# Patient Record
Sex: Male | Born: 1965 | Race: Black or African American | Hispanic: No | Marital: Single | Smoking: Never smoker
Health system: Southern US, Community
[De-identification: ages and names within clinical notes are randomized; demographics above are authoritative.]

## PROBLEM LIST (undated history)

## (undated) DIAGNOSIS — E119 Type 2 diabetes mellitus without complications: Secondary | ICD-10-CM

## (undated) DIAGNOSIS — N183 Chronic kidney disease, stage 3 unspecified: Secondary | ICD-10-CM

## (undated) DIAGNOSIS — I10 Essential (primary) hypertension: Secondary | ICD-10-CM

## (undated) DIAGNOSIS — E785 Hyperlipidemia, unspecified: Secondary | ICD-10-CM

## (undated) DIAGNOSIS — R001 Bradycardia, unspecified: Secondary | ICD-10-CM

## (undated) HISTORY — PX: LEG SURGERY: SHX1003

## (undated) HISTORY — PX: HAND SURGERY: SHX662

---

## 1999-11-26 ENCOUNTER — Encounter: Payer: Self-pay | Admitting: Emergency Medicine

## 1999-11-26 ENCOUNTER — Emergency Department (HOSPITAL_COMMUNITY): Admission: EM | Admit: 1999-11-26 | Discharge: 1999-11-26 | Payer: Self-pay | Admitting: Emergency Medicine

## 1999-11-28 ENCOUNTER — Emergency Department (HOSPITAL_COMMUNITY): Admission: EM | Admit: 1999-11-28 | Discharge: 1999-11-28 | Payer: Self-pay | Admitting: Emergency Medicine

## 1999-12-17 ENCOUNTER — Encounter: Admission: RE | Admit: 1999-12-17 | Discharge: 2000-01-02 | Payer: Self-pay | Admitting: Orthopedic Surgery

## 2000-01-23 ENCOUNTER — Encounter: Admission: RE | Admit: 2000-01-23 | Discharge: 2000-03-16 | Payer: Self-pay | Admitting: Orthopedic Surgery

## 2002-12-01 ENCOUNTER — Emergency Department (HOSPITAL_COMMUNITY): Admission: EM | Admit: 2002-12-01 | Discharge: 2002-12-01 | Payer: Self-pay | Admitting: Emergency Medicine

## 2003-09-04 ENCOUNTER — Emergency Department (HOSPITAL_COMMUNITY): Admission: EM | Admit: 2003-09-04 | Discharge: 2003-09-04 | Payer: Self-pay | Admitting: Emergency Medicine

## 2003-11-13 ENCOUNTER — Emergency Department (HOSPITAL_COMMUNITY): Admission: EM | Admit: 2003-11-13 | Discharge: 2003-11-13 | Payer: Self-pay | Admitting: Emergency Medicine

## 2005-02-03 DIAGNOSIS — E119 Type 2 diabetes mellitus without complications: Secondary | ICD-10-CM

## 2006-04-17 ENCOUNTER — Ambulatory Visit: Payer: Self-pay | Admitting: Family Medicine

## 2006-04-21 ENCOUNTER — Ambulatory Visit: Payer: Self-pay | Admitting: *Deleted

## 2006-08-12 ENCOUNTER — Ambulatory Visit: Payer: Self-pay | Admitting: Family Medicine

## 2007-02-16 ENCOUNTER — Ambulatory Visit: Payer: Self-pay | Admitting: Family Medicine

## 2007-02-16 LAB — CONVERTED CEMR LAB: Hgb A1c MFr Bld: 8.1 %

## 2007-06-30 ENCOUNTER — Emergency Department (HOSPITAL_COMMUNITY): Admission: EM | Admit: 2007-06-30 | Discharge: 2007-06-30 | Payer: Self-pay | Admitting: Emergency Medicine

## 2007-07-01 ENCOUNTER — Emergency Department (HOSPITAL_COMMUNITY): Admission: EM | Admit: 2007-07-01 | Discharge: 2007-07-02 | Payer: Self-pay | Admitting: Emergency Medicine

## 2007-12-28 ENCOUNTER — Ambulatory Visit: Payer: Self-pay | Admitting: Family Medicine

## 2008-01-17 ENCOUNTER — Encounter (INDEPENDENT_AMBULATORY_CARE_PROVIDER_SITE_OTHER): Payer: Self-pay | Admitting: *Deleted

## 2008-02-01 ENCOUNTER — Telehealth (INDEPENDENT_AMBULATORY_CARE_PROVIDER_SITE_OTHER): Payer: Self-pay | Admitting: *Deleted

## 2008-02-01 ENCOUNTER — Ambulatory Visit: Payer: Self-pay | Admitting: Internal Medicine

## 2008-02-15 ENCOUNTER — Encounter (INDEPENDENT_AMBULATORY_CARE_PROVIDER_SITE_OTHER): Payer: Self-pay | Admitting: Nurse Practitioner

## 2008-02-16 ENCOUNTER — Ambulatory Visit: Payer: Self-pay | Admitting: Nurse Practitioner

## 2008-02-16 DIAGNOSIS — E1159 Type 2 diabetes mellitus with other circulatory complications: Secondary | ICD-10-CM | POA: Insufficient documentation

## 2008-02-16 DIAGNOSIS — I10 Essential (primary) hypertension: Secondary | ICD-10-CM

## 2008-02-16 LAB — CONVERTED CEMR LAB
Bilirubin Urine: NEGATIVE
Blood Glucose, Fingerstick: 214
Blood in Urine, dipstick: NEGATIVE
Glucose, Urine, Semiquant: >=1000
Hgb A1c MFr Bld: 10.5 %
Nitrite: NEGATIVE
Specific Gravity, Urine: 1.025
Urobilinogen, UA: 0.2
WBC Urine, dipstick: NEGATIVE
pH: 5.5

## 2008-02-18 ENCOUNTER — Encounter (INDEPENDENT_AMBULATORY_CARE_PROVIDER_SITE_OTHER): Payer: Self-pay | Admitting: Nurse Practitioner

## 2008-02-18 DIAGNOSIS — H409 Unspecified glaucoma: Secondary | ICD-10-CM | POA: Insufficient documentation

## 2008-02-18 HISTORY — DX: Unspecified glaucoma: H40.9

## 2008-02-18 LAB — CONVERTED CEMR LAB
ALT: 16 units/L (ref 0–53)
AST: 15 units/L (ref 0–37)
Albumin: 5.1 g/dL (ref 3.5–5.2)
Alkaline Phosphatase: 61 units/L (ref 39–117)
BUN: 16 mg/dL (ref 6–23)
Basophils Absolute: 0 10*3/uL (ref 0.0–0.1)
Basophils Relative: 1 % (ref 0–1)
CO2: 19 meq/L (ref 19–32)
Calcium: 9.7 mg/dL (ref 8.4–10.5)
Chloride: 103 meq/L (ref 96–112)
Creatinine, Ser: 1.03 mg/dL (ref 0.40–1.50)
Eosinophils Absolute: 0.1 10*3/uL (ref 0.0–0.7)
Eosinophils Relative: 2 % (ref 0–5)
Glucose, Bld: 267 mg/dL — ABNORMAL HIGH (ref 70–99)
HCT: 41.8 % (ref 39.0–52.0)
Hemoglobin: 14.2 g/dL (ref 13.0–17.0)
Lymphocytes Relative: 40 % (ref 12–46)
Lymphs Abs: 1.3 10*3/uL (ref 0.7–4.0)
MCHC: 34 g/dL (ref 30.0–36.0)
MCV: 91.5 fL (ref 78.0–100.0)
Microalb, Ur: 2.12 mg/dL — ABNORMAL HIGH (ref 0.00–1.89)
Monocytes Absolute: 0.2 10*3/uL (ref 0.1–1.0)
Monocytes Relative: 6 % (ref 3–12)
Neutro Abs: 1.7 10*3/uL (ref 1.7–7.7)
Neutrophils Relative %: 52 % (ref 43–77)
Platelets: 266 10*3/uL (ref 150–400)
Potassium: 4.3 meq/L (ref 3.5–5.3)
RBC: 4.57 M/uL (ref 4.22–5.81)
RDW: 12.9 % (ref 11.5–15.5)
Sodium: 135 meq/L (ref 135–145)
TSH: 1.161 microintl units/mL (ref 0.350–4.50)
Total Bilirubin: 0.6 mg/dL (ref 0.3–1.2)
Total Protein: 8 g/dL (ref 6.0–8.3)
WBC: 3.3 10*3/uL — ABNORMAL LOW (ref 4.0–10.5)

## 2008-04-26 ENCOUNTER — Ambulatory Visit: Payer: Self-pay | Admitting: Nurse Practitioner

## 2008-04-26 LAB — CONVERTED CEMR LAB
Bilirubin Urine: NEGATIVE
Blood Glucose, Fingerstick: 370
Blood in Urine, dipstick: NEGATIVE
Glucose, Urine, Semiquant: >=1000
Hgb A1c MFr Bld: 13.9 %
Ketones, urine, test strip: NEGATIVE
Nitrite: NEGATIVE
Protein, U semiquant: NEGATIVE
Specific Gravity, Urine: 1.01
Urobilinogen, UA: 0.2
WBC Urine, dipstick: NEGATIVE
pH: 5.5

## 2008-04-27 ENCOUNTER — Telehealth (INDEPENDENT_AMBULATORY_CARE_PROVIDER_SITE_OTHER): Payer: Self-pay | Admitting: Nurse Practitioner

## 2008-04-27 DIAGNOSIS — F528 Other sexual dysfunction not due to a substance or known physiological condition: Secondary | ICD-10-CM

## 2008-05-02 ENCOUNTER — Encounter (INDEPENDENT_AMBULATORY_CARE_PROVIDER_SITE_OTHER): Payer: Self-pay | Admitting: *Deleted

## 2008-05-15 ENCOUNTER — Telehealth (INDEPENDENT_AMBULATORY_CARE_PROVIDER_SITE_OTHER): Payer: Self-pay | Admitting: Nurse Practitioner

## 2008-06-01 ENCOUNTER — Encounter (INDEPENDENT_AMBULATORY_CARE_PROVIDER_SITE_OTHER): Payer: Self-pay | Admitting: Nurse Practitioner

## 2008-06-06 ENCOUNTER — Encounter (INDEPENDENT_AMBULATORY_CARE_PROVIDER_SITE_OTHER): Payer: Self-pay | Admitting: Nurse Practitioner

## 2008-09-19 ENCOUNTER — Ambulatory Visit: Payer: Self-pay | Admitting: Nurse Practitioner

## 2008-09-19 ENCOUNTER — Encounter (INDEPENDENT_AMBULATORY_CARE_PROVIDER_SITE_OTHER): Payer: Self-pay | Admitting: Nurse Practitioner

## 2008-09-27 ENCOUNTER — Ambulatory Visit: Payer: Self-pay | Admitting: Nurse Practitioner

## 2008-09-27 LAB — CONVERTED CEMR LAB
Bilirubin Urine: NEGATIVE
Blood Glucose, Fingerstick: 130
Blood in Urine, dipstick: NEGATIVE
Glucose, Urine, Semiquant: NEGATIVE
Ketones, urine, test strip: NEGATIVE
Nitrite: NEGATIVE
Protein, U semiquant: NEGATIVE
Specific Gravity, Urine: >=1.03
Urobilinogen, UA: 0.2
WBC Urine, dipstick: NEGATIVE
pH: 5

## 2008-10-03 ENCOUNTER — Telehealth (INDEPENDENT_AMBULATORY_CARE_PROVIDER_SITE_OTHER): Payer: Self-pay | Admitting: *Deleted

## 2008-10-03 LAB — CONVERTED CEMR LAB
Cholesterol: 211 mg/dL — ABNORMAL HIGH (ref 0–200)
HDL: 67 mg/dL (ref >39)
Hgb A1c MFr Bld: 7.8 % — ABNORMAL HIGH (ref 4.6–6.1)
LDL Cholesterol: 133 mg/dL — ABNORMAL HIGH (ref 0–99)
Sex Hormone Binding: 28 nmol/L (ref 13–71)
Testosterone Free: 83.3 pg/mL (ref 47.0–244.0)
Testosterone-% Free: 2.2 % (ref 1.6–2.9)
Testosterone: 376.62 ng/dL (ref 350–890)
Total CHOL/HDL Ratio: 3.1
Triglycerides: 57 mg/dL (ref <150)
VLDL: 11 mg/dL (ref 0–40)

## 2008-10-04 ENCOUNTER — Encounter (INDEPENDENT_AMBULATORY_CARE_PROVIDER_SITE_OTHER): Payer: Self-pay | Admitting: Nurse Practitioner

## 2008-11-29 ENCOUNTER — Encounter (INDEPENDENT_AMBULATORY_CARE_PROVIDER_SITE_OTHER): Payer: Self-pay | Admitting: *Deleted

## 2009-02-26 ENCOUNTER — Ambulatory Visit: Payer: Self-pay | Admitting: Nurse Practitioner

## 2009-02-26 DIAGNOSIS — E7800 Pure hypercholesterolemia, unspecified: Secondary | ICD-10-CM | POA: Insufficient documentation

## 2009-02-26 LAB — CONVERTED CEMR LAB
AST: 33 units/L (ref 0–37)
Alkaline Phosphatase: 46 units/L (ref 39–117)
BUN: 16 mg/dL (ref 6–23)
Blood Glucose, AC Bkfst: 177 mg/dL
Blood in Urine, dipstick: NEGATIVE
Creatinine, Ser: 1.15 mg/dL (ref 0.40–1.50)
Glucose, Urine, Semiquant: >=1000
HDL goal, serum: 40 mg/dL
HDL: 49 mg/dL (ref >39)
LDL Cholesterol: 105 mg/dL — ABNORMAL HIGH (ref 0–99)
LDL Goal: 100 mg/dL
TSH: 1.988 microintl units/mL (ref 0.350–4.500)
Total Bilirubin: 0.3 mg/dL (ref 0.3–1.2)
Total CHOL/HDL Ratio: 3.4
VLDL: 12 mg/dL (ref 0–40)
pH: 5.5

## 2009-02-28 ENCOUNTER — Encounter (INDEPENDENT_AMBULATORY_CARE_PROVIDER_SITE_OTHER): Payer: Self-pay | Admitting: Nurse Practitioner

## 2009-02-28 ENCOUNTER — Telehealth (INDEPENDENT_AMBULATORY_CARE_PROVIDER_SITE_OTHER): Payer: Self-pay | Admitting: *Deleted

## 2009-04-11 ENCOUNTER — Telehealth (INDEPENDENT_AMBULATORY_CARE_PROVIDER_SITE_OTHER): Payer: Self-pay | Admitting: Nurse Practitioner

## 2009-07-24 ENCOUNTER — Encounter (INDEPENDENT_AMBULATORY_CARE_PROVIDER_SITE_OTHER): Payer: Self-pay | Admitting: Nurse Practitioner

## 2009-10-01 ENCOUNTER — Encounter (INDEPENDENT_AMBULATORY_CARE_PROVIDER_SITE_OTHER): Payer: Self-pay | Admitting: Nurse Practitioner

## 2010-02-08 ENCOUNTER — Encounter (INDEPENDENT_AMBULATORY_CARE_PROVIDER_SITE_OTHER): Payer: Self-pay | Admitting: *Deleted

## 2010-03-07 NOTE — Progress Notes (Signed)
Summary: pharmacy concern  Phone Note Call from Patient   Summary of Call: PT CALLED STATING THAT HE HAS NOT BEEN ABLE TO GET HIS VIAGRA 100MG  FILLED SINCE HE IS WAITING ON ICP. PT STATES PHARMACY TOLD HIM THEY HAVE 50 MG ON HAND BUT SINCE HIS  PROVIDER DIDN'T WRITE FOR THAT THEY CAN'T GIVE TO HIM. I MADE A CALL TO PHARMACY AND SPOKE WITH JENNIFER AND THEY DO HAVE 50 BUT OBVIOUSLY THEY ARE FOR OTHER PATIENTS ON THE PATIENT ASSISTANCE PROGRAM. PLEASE CONTACT PT AND LET HIM KNOW THAT EVEN IF RX IS CHANGED THEY WOULD HAVE TO DO A NEW APPLICATION AND THE PROCESS STARTS ALL OVER AGAIN. THE MEDS THAT ARE THERE FOR 50MG  ARE FOR PATIENTS IN THE PROGRAM WHOSE RX SAYS 50MG . Initial call taken by: Mikey College CMA,  April 11, 2009 9:22 AM  Follow-up for Phone Call        spoke with patient regarding the process in the pharmacy and what needs to happen and what happens when a patient uses the patient assistance program. pt vebalized understanding and is aware there is nothing we can do until the medication comes in. Follow-up by: Mikey College CMA,  April 13, 2009 2:11 PM

## 2010-03-07 NOTE — Progress Notes (Signed)
Summary: Form   Phone Note Outgoing Call   Summary of Call: form completed and in office 1. fax copy  2.  make copy for EMR 3. mail original to pt Initial call taken by: Lehman Prom FNP,  February 28, 2009 8:23 AM  Follow-up for Phone Call        FORM FAXED TO [phone] SCANNED INTO EMR MAILED ORIGINAL BACK TO PT @ [address] [id number] [id number] Follow-up by: Arta Bruce,  February 28, 2009 9:41 AM

## 2010-03-07 NOTE — Letter (Signed)
Summary: *HSN Results Follow up  [facility]  [address] [place], [place] [address]   Phone: [phone]  Fax: [phone]      02/08/2010   James Bradford [id number] [place], [place]  [address]   Dear  James Bradford,                            ____S.Drinkard,FNP   ____D. Gore,FNP       ____B. McPherson,MD   ____V. Rankins,MD    ____E. Mulberry,MD    ____N. Daphine Deutscher, FNP  ____D. Reche Dixon, MD    ____K. Philipp Deputy, MD    ____Other     This letter is to inform you that your recent test(s):  _______Pap Smear    _______Lab Test     _______X-ray    _______ is within acceptable limits  _______ requires a medication change  _______ requires a follow-up lab visit  _______ requires a follow-up visit with your provider   Comments:  We have been trying to reach you at [phone].  Please contact the office at your earliest convenience.       _________________________________________________________ If you have any questions, please contact our office                     Sincerely,  James Bradford [facility]

## 2010-03-07 NOTE — Letter (Signed)
Summary: Letter//DISCHARGE LETTER  Letter//DISCHARGE LETTER   Imported By: Arta Bruce 07/24/2009 14:13:00  _____________________________________________________________________  External Attachment:    Type:   Image     Comment:   External Document

## 2010-03-07 NOTE — Letter (Signed)
Summary: [facility]/MAILED ORGINAL TO PT  [facility]/MAILED ORGINAL TO PT   Imported By: Arta Bruce 02/28/2009 09:34:46  _____________________________________________________________________  External Attachment:    Type:   Image     Comment:   External Document

## 2010-03-07 NOTE — Assessment & Plan Note (Signed)
Summary: Diabetes   Vital Signs:  Patient profile:   45 year old male Height:      68 inches Weight:      174.1 pounds BMI:     26.57 Temp:     98.6 degrees F oral Pulse rate:   87 / minute Pulse rhythm:   regular Resp:     18 per minute BP sitting:   119 / 79  (left arm) Cuff size:   regular  Vitals Entered By: Arthor Captain (February 26, 2009 11:08 AM) CC: follow-up visit, DM, Hypertension Management, Lipid Management Is Patient Diabetic? Yes Pain Assessment Patient in pain? no      CBG Device ID a  Does patient need assistance? Functional Status Self care Ambulation Normal   Primary Care Provider:  Lehman Prom FNP  CC:  follow-up visit, DM, Hypertension Management, and Lipid Management.  History of Present Illness:  Pt into the office for follow up on labs.  Diabetes - Pt stopped taking his medications due to GI symptoms.   Seems inconsistent regarding his meds and if he wants to take the meds.    Obesity - Pt has started exercising earlier this month. Changed eating habits and he has lost weight and then questions why he is losing so much wieght.  He is taking lots of supplements and working out 3 hours per day.  ED - Pt reports that he has started taking the cholesterol med at night but has noticed it interfering with his ED.  Not willing to elaborate with a male provider.  He would rather discuss with a male provider  Forms in office today that he needs completed for school. The same forms were completed 1-2 months ago by this provider but no indication given to pt why he needs the again.      Hypertension History:      He denies headache, chest pain, and palpitations.  He notes no problems with any antihypertensive medication side effects.        Positive major cardiovascular risk factors include diabetes, hyperlipidemia, and hypertension.  Negative major cardiovascular risk factors include male age less than 61 years old and non-tobacco-user  status.        Further assessment for target organ damage reveals no history of ASHD, cardiac end-organ damage (CHF/LVH), stroke/TIA, peripheral vascular disease, renal insufficiency, or hypertensive retinopathy.    Lipid Management History:      Positive NCEP/ATP III risk factors include diabetes and hypertension.  Negative NCEP/ATP III risk factors include male age less than 66 years old, HDL cholesterol greater than 60, non-tobacco-user status, no ASHD (atherosclerotic heart disease), no prior stroke/TIA, no peripheral vascular disease, and no history of aortic aneurysm.        The patient states that he does not know about the "Therapeutic Lifestyle Change" diet.  The patient does not know about adjunctive measures for cholesterol lowering.  He expresses no side effects from his lipid-lowering medication.  The patient denies any symptoms to suggest myopathy or liver disease.  Comments: Pt is taking his cholesterol meds as ordered.    Habits & Providers  Alcohol-Tobacco-Diet     Alcohol drinks/day: <1     Alcohol Counseling: to decrease amount and/or frequency of alcohol intake     Alcohol type: beer     Tobacco Status: never     Passive Smoke Exposure: no  Exercise-Depression-Behavior     Does Patient Exercise: yes     Exercise Counseling: not indicated; exercise  is adequate     Type of exercise: gym     Depression Counseling: not indicated; screening negative for depression     Drug Use: no     Seat Belt Use: 100  Allergies (verified): 1)  ! * Amaryl  Review of Systems CV:  Denies chest pain or discomfort, swelling of feet, and swelling of hands. Resp:  Denies cough. GI:  Complains of vomiting; denies diarrhea and nausea.  Physical Exam  General:  alert.   Head:  normocephalic.   Lungs:  normal breath sounds.   Heart:  normal rate and regular rhythm.   Abdomen:  non-tender and normal bowel sounds.   Msk:  up to exam table Neurologic:  alert & oriented X3.      Impression & Recommendations:  Problem # 1:  DIABETES MELLITUS, TYPE II (ICD-250.00) Uncontrolled advised pt that he will need to either take meds as ordered or he will need to take insulin His updated medication list for this problem includes:    Lisinopril-hydrochlorothiazide 10-12.5 Mg Tabs (Lisinopril-hydrochlorothiazide) .Marland Kitchen... 1 tablet by mouth by mouth daily for blood pressure    Glucotrol Xl 10 Mg Xr24h-tab (Glipizide) .Marland Kitchen... 1 tablet by mouth daily for blood sugar    Metformin Hcl 500 Mg Tabs (Metformin hcl) .Marland Kitchen... 2 tablets by mouth two times a day for diabetes    Bayer Low Strength 81 Mg Tbec (Aspirin) ..... One tablet by mouth daily  Orders: Hemoglobin A1C (83036) Capillary Blood Glucose/CBG (96045) UA Dipstick w/o Micro (manual) (40981) T-Urine Microalbumin w/creat. ratio [id number])  Problem # 2:  HYPERTENSION, BENIGN ESSENTIAL (ICD-401.1) DASH diet stable His updated medication list for this problem includes:    Lisinopril-hydrochlorothiazide 10-12.5 Mg Tabs (Lisinopril-hydrochlorothiazide) .Marland Kitchen... 1 tablet by mouth by mouth daily for blood pressure  Orders: T-HIV Antibody  (Reflex) ([id number]) T-TSH ([id number])  Problem # 3:  HYPERCHOLESTEROLEMIA (ICD-272.0) will check lipids today His updated medication list for this problem includes:    Pravastatin Sodium 10 Mg Tabs (Pravastatin sodium) ..... One tablet by mouth nightly for cholesterol  Orders: T-Lipid Profile ([id number]) T-Comprehensive Metabolic Panel ([id number])  Complete Medication List: 1)  Lisinopril-hydrochlorothiazide 10-12.5 Mg Tabs (Lisinopril-hydrochlorothiazide) .Marland Kitchen.. 1 tablet by mouth by mouth daily for blood pressure 2)  Glucotrol Xl 10 Mg Xr24h-tab (Glipizide) .Marland Kitchen.. 1 tablet by mouth daily for blood sugar 3)  Metformin Hcl 500 Mg Tabs (Metformin hcl) .... 2 tablets by mouth two times a day for diabetes 4)  Glucometer Elite Classic Kit (Blood glucose monitoring suppl) .... Dispense  glucometer dx 250.02 5)  Lancets Misc (Lancets) .... To check blood sugar twice daily 6)  Sidekick Blood Glucose System Devi (Blood gluc meter disp-strips) .... To use with glucometer to check blood sugar two times a day  dx 250.02 7)  Bayer Low Strength 81 Mg Tbec (Aspirin) .... One tablet by mouth daily 8)  Pravastatin Sodium 10 Mg Tabs (Pravastatin sodium) .... One tablet by mouth nightly for cholesterol 9)  Viagra 100 Mg Tabs (Sildenafil citrate) .Marland Kitchen.. 1 tablet by mouth as needed 30 minutes before sexual activity  Other Orders: T-PSA ([id number])  Hypertension Assessment/Plan:      The patient's hypertensive risk group is category C: Target organ damage and/or diabetes.  His calculated 10 year risk of coronary heart disease is 6 %.  Today's blood pressure is 119/79.  His blood pressure goal is < 130/80.  Lipid Assessment/Plan:      Based on NCEP/ATP III, the patient's risk factor category is "history of  diabetes".  The patient's lipid goals are as follows: Total cholesterol goal is 200; LDL cholesterol goal is 100; HDL cholesterol goal is 40; Triglyceride goal is 150.    Patient Instructions: 1)  Follow up in this office in 3 months for diabetes. 2)  Your form will be completed and faxed.  The hardcopy will be mailed.  Laboratory Results   Urine Tests  Date/Time Received: February 26, 2009 11:24 AM  Date/Time Reported: February 26, 2009 11:27 AM   Routine Urinalysis   Color: lt. yellow Appearance: Clear Glucose: >=1000   (Normal Range: Negative) Bilirubin: negative   (Normal Range: Negative) Ketone: trace (5)   (Normal Range: Negative) Spec. Gravity: 1.015   (Normal Range: 1.003-1.035) Blood: negative   (Normal Range: Negative) pH: 5.5   (Normal Range: 5.0-8.0) Protein: trace   (Normal Range: Negative) Urobilinogen: 0.2   (Normal Range: 0-1) Nitrite: negative   (Normal Range: Negative) Leukocyte Esterace: negative   (Normal Range: Negative)     Blood Tests      HGBA1C: 8.8%   (Normal Range: Non-Diabetic - 3-6%   Control Diabetic - 6-8%) CBG Fasting:: 177      Laboratory Results   Urine Tests    Routine Urinalysis   Color: lt. yellow Appearance: Clear Glucose: >=1000   (Normal Range: Negative) Bilirubin: negative   (Normal Range: Negative) Ketone: trace (5)   (Normal Range: Negative) Spec. Gravity: 1.015   (Normal Range: 1.003-1.035) Blood: negative   (Normal Range: Negative) pH: 5.5   (Normal Range: 5.0-8.0) Protein: trace   (Normal Range: Negative) Urobilinogen: 0.2   (Normal Range: 0-1) Nitrite: negative   (Normal Range: Negative) Leukocyte Esterace: negative   (Normal Range: Negative)     Blood Tests     HGBA1C: 8.8%   (Normal Range: Non-Diabetic - 3-6%   Control Diabetic - 6-8%) CBG Fasting:: 177mg /dL

## 2010-03-07 NOTE — Letter (Signed)
Summary: FAXED REQUESTED RECORDS TO [facility]  FAXED REQUESTED RECORDS TO [facility]   Imported By: Arta Bruce 10/01/2009 11:12:33  _____________________________________________________________________  External Attachment:    Type:   Image     Comment:   External Document

## 2010-03-07 NOTE — Letter (Signed)
Summary: Lipid Letter  [facility]  [address] [place], [place] [address]   Phone: [phone]  Fax: [phone]    02/28/2009  Dorinda Hill [address] [place], [place]  [address]  Dear Gerda Diss:  We have carefully reviewed your last lipid profile from 02/26/2009 and the results are noted below with a summary of recommendations for lipid management.    Cholesterol:       166     Goal: less than 200   HDL "good" Cholesterol:   49     Goal: greater than 40   LDL "bad" Cholesterol:   105     Goal: less than 70   Triglycerides:       59     Goal: less than 150    Recent labs shows that your blood sugar is still high.  As discussed during the office visit you will need to take diabetes medications as ordered or the next alternative will be insulin.  Good control now is beneficial to your kidneys, eyes, circulation, etc.    Your cholesterol is much improved.  Continue current medications.     Current Medications: 1)    Lisinopril-hydrochlorothiazide 10-12.5 Mg Tabs (Lisinopril-hydrochlorothiazide) .Marland Kitchen.. 1 tablet by mouth by mouth daily for blood pressure 2)    Glucotrol Xl 10 Mg Xr24h-tab (Glipizide) .Marland Kitchen.. 1 tablet by mouth daily for blood sugar 3)    Metformin Hcl 500 Mg Tabs (Metformin hcl) .... 2 tablets by mouth two times a day for diabetes 4)    Glucometer Elite Classic  Kit (Blood glucose monitoring suppl) .... Dispense glucometer dx 250.02 5)    Lancets  Misc (Lancets) .... To check blood sugar twice daily 6)    Sidekick Blood Glucose System  Devi (Blood gluc meter disp-strips) .... To use with glucometer to check blood sugar two times a day  dx 250.02 7)    Bayer Low Strength 81 Mg Tbec (Aspirin) .... One tablet by mouth daily 8)    Pravastatin Sodium 10 Mg Tabs (Pravastatin sodium) .... One tablet by mouth nightly for cholesterol 9)    Viagra 100 Mg Tabs (Sildenafil citrate) .Marland Kitchen.. 1 tablet by mouth as needed 30 minutes before sexual activity  If you have  any questions, please call. We appreciate being able to work with you.   Sincerely,    [facility] Lehman Prom FNP

## 2010-03-07 NOTE — Letter (Signed)
Summary: *Referral Letter  [facility]  [address] [place], [place] [address]   Phone: [phone]  Fax: [phone]    02/26/2009   James Bradford [address] [place], [place]  [address]  Phone: [phone]  To whom it may concern: [name] is being seen in this office for uncontrolled diabetes.  His medications is being tritrated and regulated for better control.  He will maintain follow up in this office at regular intervals for diabetes and high blood pressure.  Current Medical Problems: 1)  HYPERCHOLESTEROLEMIA (ICD-272.0) 2)  ERECTILE DYSFUNCTION (ICD-302.72) 3)  GLAUCOMA (ICD-365.9) 4)  HYPERTENSION, BENIGN ESSENTIAL (ICD-401.1) 5)  DIABETES MELLITUS, TYPE II (ICD-250.00)   Current Medications: 1)  LISINOPRIL-HYDROCHLOROTHIAZIDE 10-12.5 MG TABS (LISINOPRIL-HYDROCHLOROTHIAZIDE) 1 tablet by mouth by mouth daily for blood pressure 2)  GLUCOTROL XL 10 MG XR24H-TAB (GLIPIZIDE) 1 tablet by mouth daily for blood sugar 3)  METFORMIN HCL 500 MG TABS (METFORMIN HCL) 2 tablets by mouth two times a day for diabetes 4)  GLUCOMETER ELITE CLASSIC  KIT (BLOOD GLUCOSE MONITORING SUPPL) dispense glucometer Dx 250.02 5)  LANCETS  MISC (LANCETS) To check blood sugar twice daily 6)  SIDEKICK BLOOD GLUCOSE SYSTEM  DEVI (BLOOD GLUC METER DISP-STRIPS) To use with glucometer to check blood sugar two times a day  Dx 250.02 7)  BAYER LOW STRENGTH 81 MG TBEC (ASPIRIN) One tablet by mouth daily 8)  PRAVASTATIN SODIUM 10 MG TABS (PRAVASTATIN SODIUM) One tablet by mouth nightly for cholesterol 9)  VIAGRA 100 MG TABS (SILDENAFIL CITRATE) 1 tablet by mouth as needed 30 minutes before sexual activity   Please contact us if you have any further questions or need additional information.  Sincerely,   Lehman Prom FNP [facility]

## 2010-07-10 ENCOUNTER — Inpatient Hospital Stay (INDEPENDENT_AMBULATORY_CARE_PROVIDER_SITE_OTHER)
Admission: RE | Admit: 2010-07-10 | Discharge: 2010-07-10 | Disposition: A | Discharge status: Home / Self Care | Payer: Self-pay | Source: Ambulatory Visit | Attending: Family Medicine | Admitting: Family Medicine

## 2010-07-10 DIAGNOSIS — Z760 Encounter for issue of repeat prescription: Secondary | ICD-10-CM

## 2010-07-10 LAB — HEMOGLOBIN A1C: Mean Plasma Glucose: 249 mg/dL — ABNORMAL HIGH (ref <117)

## 2010-10-30 LAB — POCT I-STAT 3, VENOUS BLOOD GAS (G3P V)
O2 Saturation: 93
pCO2, Ven: 40.3 — ABNORMAL LOW
pO2, Ven: 67 — ABNORMAL HIGH

## 2010-10-30 LAB — POCT I-STAT, CHEM 8
Chloride: 97
Glucose, Bld: 274 — ABNORMAL HIGH
HCT: 42
Potassium: 3.5

## 2012-08-22 ENCOUNTER — Ambulatory Visit: Payer: Self-pay | Admitting: Physician Assistant

## 2012-08-22 ENCOUNTER — Encounter: Payer: Self-pay | Admitting: Physician Assistant

## 2012-08-22 VITALS — BP 114/74 | HR 83 | Temp 98.0°F | Resp 17 | Ht 67.5 in | Wt 186.0 lb

## 2012-08-22 DIAGNOSIS — Z0289 Encounter for other administrative examinations: Secondary | ICD-10-CM

## 2012-08-22 NOTE — Progress Notes (Signed)
   [address] [place] [address]   Phone [phone]  Subjective:    Patient ID: James Bradford, male    DOB: 1965-11-23, 47 y.o.   MRN: [id number]  HPI Pt presents to clinic for DOT exam.  He has DM, HTN and hypercholesterolemia but he is on meds.  He has tried to change his diet to healthy meats and veggies and fruits and no simple sugars.  He has never had problems with hypoglycemia.  He sleeps well and does not snore.  He does not check his sugar.  A1C - 8 in November 2013  Review of Systems  Constitutional: Negative.   HENT: Negative.   Eyes: Negative.   Respiratory: Negative.   Gastrointestinal: Negative.   Endocrine: Negative.   Genitourinary: Negative.   Musculoskeletal: Negative.   Skin: Negative.   Allergic/Immunologic: Negative.   Neurological: Negative.   Hematological: Negative.        Objective:   Physical Exam  Vitals reviewed. Constitutional: He is oriented to person, place, and time. He appears well-developed and well-nourished.  HENT:  Head: Normocephalic and atraumatic.  Right Ear: External ear normal.  Left Ear: External ear normal.  Eyes: Conjunctivae and EOM are normal. Pupils are equal, round, and reactive to light.  Neck: Normal range of motion. Neck supple.  Cardiovascular: Normal rate, regular rhythm and normal heart sounds.   No murmur heard. Pulmonary/Chest: Effort normal and breath sounds normal.  Abdominal: Soft. Bowel sounds are normal.  Musculoskeletal: Normal range of motion.       Legs: Neurological: He is alert and oriented to person, place, and time. He has normal reflexes.  Skin: Skin is warm and dry.  Psychiatric: He has a normal mood and affect. His behavior is normal. Judgment and thought content normal.   Pt's urine has 2000+ glucose      Assessment & Plan:  Other general medical examination for administrative purposes  Pt has well controlled HTN but his diabetes is not under great control with the glucose in his  urine.  He needs f/u up with his PCP and I need record of his A1C.  I gave him a month card because of his glucosuria and lack of f/u since 11/13.  He will bring in paperwork for my review and if A1C is > 10 I will give him a year card.  Benny Lennert PA-C 08/22/2012 8:57 AM

## 2012-08-23 ENCOUNTER — Emergency Department (INDEPENDENT_AMBULATORY_CARE_PROVIDER_SITE_OTHER)
Admission: EM | Admit: 2012-08-23 | Discharge: 2012-08-23 | Disposition: A | Discharge status: Home / Self Care | Payer: Self-pay | Source: Home / Self Care

## 2012-08-23 ENCOUNTER — Encounter (HOSPITAL_COMMUNITY): Payer: Self-pay | Admitting: *Deleted

## 2012-08-23 DIAGNOSIS — E119 Type 2 diabetes mellitus without complications: Secondary | ICD-10-CM

## 2012-08-23 DIAGNOSIS — K044 Acute apical periodontitis of pulpal origin: Secondary | ICD-10-CM

## 2012-08-23 DIAGNOSIS — E7800 Pure hypercholesterolemia, unspecified: Secondary | ICD-10-CM

## 2012-08-23 DIAGNOSIS — K047 Periapical abscess without sinus: Secondary | ICD-10-CM

## 2012-08-23 HISTORY — DX: Essential (primary) hypertension: I10

## 2012-08-23 HISTORY — DX: Type 2 diabetes mellitus without complications: E11.9

## 2012-08-23 LAB — POCT I-STAT, CHEM 8
Chloride: 101 mEq/L (ref 96–112)
Glucose, Bld: 209 mg/dL — ABNORMAL HIGH (ref 70–99)
HCT: 42 % (ref 39.0–52.0)
Hemoglobin: 14.3 g/dL (ref 13.0–17.0)
Potassium: 3.9 mEq/L (ref 3.5–5.1)

## 2012-08-23 MED ORDER — GLIPIZIDE 5 MG PO TABS: 5.0000 mg | ORAL_TABLET | Freq: Two times a day (BID) | ORAL | Status: DC

## 2012-08-23 MED ORDER — PENICILLIN V POTASSIUM 500 MG PO TABS: 500.0000 mg | ORAL_TABLET | Freq: Four times a day (QID) | ORAL | Status: DC

## 2012-08-23 MED ORDER — METFORMIN HCL 500 MG PO TABS: 1000.0000 mg | ORAL_TABLET | Freq: Two times a day (BID) | ORAL | Status: DC

## 2012-08-23 MED ORDER — PRAVASTATIN SODIUM 10 MG PO TABS: 10.0000 mg | ORAL_TABLET | Freq: Every day | ORAL | Status: DC

## 2012-08-23 MED ORDER — LISINOPRIL 10 MG PO TABS: 10.0000 mg | ORAL_TABLET | Freq: Every day | ORAL | Status: DC

## 2012-08-23 NOTE — ED Provider Notes (Signed)
Chief Complaint:   Chief Complaint  Patient presents with  . Diabetes    History of Present Illness:   James Bradford is a 47 year old male who has had diabetes since 2007. He has been on metformin 1000 mg twice a day, glipizide 5 mg daily, and takes lisinopril 10 mg a day for kidney protection. He has been off all his medications for about 4 days. He describes polyuria, polydipsia, blurry vision, and occasional swelling of his legs. He denies any changes in his weight, chest pain, shortness of breath, abdominal pain, nausea, vomiting, diarrhea, extremity pain, paresthesias, or ulcerations. 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 He comes in today to get this drawn here. He was being seen at [facility], but since they shut down he has not been able to get in for care. He also was told that he had swollen lymph nodes. No cause for this was found. He states his throat is sometimes sore. He also has a loose left upper first incisor. He takes pravastatin 10 mg a day for hypercholesterolemia. He is applying for a job in [place] and will be relocating there, although he plans to maintain a household in [place] and return here for his medical care.  Review of Systems:  Other than noted above, the patient denies any of the following symptoms. Systemic:  No fever, chills, fatigue, weight loss or gain. Eye:  No blurred vision or diplopia. Lungs:  No cough, wheezing, or shortness of breath. Heart:  No chest pain, tightness, pressure, palpitation, dizziness, syncope, or edema. Abdomen:  No abdominal pain, nausea, vomiting or diarrrhea. GU:  No dysuria, frequency, urgency, hematuria. Ext:  No pain, paresthesias, swelling, or ulcerations. Endocrine:  No polyuria, polydipsia, heat or cold intolerance. Skin:  No rash or itching. Neuro:  No focal weakness or numbness.   PMFSH:   Past medical history, family history, social history, meds, and allergies were reviewed.   Physical Exam:   Vital signs:  BP 142/92  Pulse 90  Temp(Src) 98.8 F (37.1 C)  Resp 20  SpO2 100% Gen:  Alert, oriented, in no distress. Eye:  PERRL, full EOM, lids conjunctivas, and sclera unremarkable. ENT:  TMs and canals normal.  Mucous membranes moist.  No acetone odor.  Pharynx clear.  His left, upper, first incisor is somewhat loose and tender to palpation. Neck:  Supple, full ROM, no adenopathy or tenderness.  No JVD. He does not have any swollen or enlarged lymph nodes. Lungs:  Clear to auscultation.  No wheezes, rales or rhonchi. Heart:  Regular rhythm.  No gallops or murmers. Abdomen:  Soft, flat, non-distended, nontener.  No hepato-splenomegaly or mass.  Bowel sounds normal.  No pulsatile midline mass or bruit. Ext:  No edema, pulses full.  No ulceration or skin lesions. Skin:  Clear, warm and dry.  No rash or lesions. Neuro:  Alert and oriented times 3.  No focal weakness.  Speech normal.  CNs intact.  Labs:   Results for orders placed during the hospital encounter of 08/23/12  POCT I-STAT, CHEM 8      Result Value Range   Sodium 139  135 - 145 mEq/L   Potassium 3.9  3.5 - 5.1 mEq/L   Chloride 101  96 - 112 mEq/L   BUN 19  6 - 23 mg/dL   Creatinine, Ser  1.00  0.50 - 1.35 mg/dL   Glucose, Bld 161 (*) 70 - 99 mg/dL   Calcium, Ion 0.96 (*) 1.12 - 1.23 mmol/L   TCO2 24  0 - 100 mmol/L   Hemoglobin 14.3  13.0 - 17.0 g/dL   HCT 04.5  40.9 - 81.1 %    A hemoglobin A1c was also drawn.  Assessment:  The primary encounter diagnosis was DIABETES MELLITUS, TYPE II. Diagnoses of HYPERCHOLESTEROLEMIA and Dental infection were also pertinent to this visit.  Needs followup and was given the name of Dr. Standley Dakins at the [facility].  Plan:   1.  The following meds were prescribed:   Discharge Medication List as of 08/23/2012  9:35 AM    START taking these  medications   Details  !! glipiZIDE (GLUCOTROL) 5 MG tablet Take 1 tablet (5 mg total) by mouth 2 (two) times daily before a meal., Starting 08/23/2012, Until Discontinued, Normal    !! lisinopril (PRINIVIL,ZESTRIL) 10 MG tablet Take 1 tablet (10 mg total) by mouth daily., Starting 08/23/2012, Until Discontinued, Normal    !! metFORMIN (GLUCOPHAGE) 500 MG tablet Take 2 tablets (1,000 mg total) by mouth 2 (two) times daily with a meal., Starting 08/23/2012, Until Discontinued, Normal    penicillin v potassium (VEETID) 500 MG tablet Take 1 tablet (500 mg total) by mouth 4 (four) times daily., Starting 08/23/2012, Until Discontinued, Normal    !! pravastatin (PRAVACHOL) 10 MG tablet Take 1 tablet (10 mg total) by mouth daily., Starting 08/23/2012, Until Discontinued, Normal     !! - Potential duplicate medications found. Please discuss with provider.     2.  The patient was instructed in symptomatic care and handouts were given. 3.  The patient was told to return if becoming worse in any way, if no better in 3 or 4 days, and given some red flag symptoms such as any symptoms of low blood sugar, chest pain, shortness of breath, or GI problems that would indicate earlier return. 4.  Follow up with Dr. Standley Dakins.     Reuben Likes, MD 08/23/12 1136

## 2012-08-23 NOTE — ED Notes (Signed)
Pt   Here    For  evaul         Of  Her  Diabetes        And  htn  Had  Recent      Elevated  Glucose   During a  Dot  Physical         He  denys  Any pain       He  Is  Alert  And  Oriented

## 2012-08-25 NOTE — ED Notes (Signed)
Hgb A1C 11.4 H, mean glucose 280 H.  7/22 Lab shown to Dr. Lorenz Coaster and he said no further action. He said pt. was going to come pick it up and take it to the clinic to follow-up his diabetes. Vassie Moselle 08/25/2012

## 2012-08-31 ENCOUNTER — Telehealth: Payer: Self-pay | Admitting: Radiology

## 2012-08-31 NOTE — Telephone Encounter (Signed)
Patient called very angry. 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 His A1C is over 11 and he is complaining of blurred vision. I explained to him we have to go by DOT guidelines. He was yelling at me over the phone. While I was explaining to him what he needs to get from his PCP he disconnected the call. He states he is going to stop payment on his credit card for the exam. He may call you back , to you [facility]

## 2012-10-01 NOTE — ED Notes (Signed)
Reviewed record with dr Artis Flock, metformin written for quantity of 60 tablets, taken as instructed -meds will only last 15 days.  Dr Artis Flock corrected count, called to pharmacy as escribed, told to increase quantity to 120, a months supply

## 2013-05-19 ENCOUNTER — Emergency Department (HOSPITAL_COMMUNITY)
Admission: EM | Admit: 2013-05-19 | Discharge: 2013-05-19 | Disposition: A | Discharge status: Home / Self Care | Payer: No Typology Code available for payment source | Attending: Emergency Medicine | Admitting: Emergency Medicine

## 2013-05-19 ENCOUNTER — Encounter (HOSPITAL_COMMUNITY): Payer: Self-pay | Admitting: Emergency Medicine

## 2013-05-19 DIAGNOSIS — R739 Hyperglycemia, unspecified: Secondary | ICD-10-CM

## 2013-05-19 DIAGNOSIS — I10 Essential (primary) hypertension: Secondary | ICD-10-CM | POA: Insufficient documentation

## 2013-05-19 DIAGNOSIS — E119 Type 2 diabetes mellitus without complications: Secondary | ICD-10-CM | POA: Insufficient documentation

## 2013-05-19 LAB — CBC
HCT: 34.5 % — ABNORMAL LOW (ref 39.0–52.0)
Hemoglobin: 11.6 g/dL — ABNORMAL LOW (ref 13.0–17.0)
MCH: 31 pg (ref 26.0–34.0)
MCHC: 33.6 g/dL (ref 30.0–36.0)
MCV: 92.2 fL (ref 78.0–100.0)
Platelets: 269 K/uL (ref 150–400)
RBC: 3.74 MIL/uL — ABNORMAL LOW (ref 4.22–5.81)
RDW: 12.4 % (ref 11.5–15.5)
WBC: 4.2 K/uL (ref 4.0–10.5)

## 2013-05-19 LAB — CBG MONITORING, ED
Glucose-Capillary: 597 mg/dL (ref 70–99)
Glucose-Capillary: 544 mg/dL — ABNORMAL HIGH (ref 70–99)
Glucose-Capillary: 287 mg/dL — ABNORMAL HIGH (ref 70–99)
Glucose-Capillary: 133 mg/dL — ABNORMAL HIGH (ref 70–99)

## 2013-05-19 LAB — I-STAT VENOUS BLOOD GAS, ED
Acid-base deficit: 2 mmol/L (ref 0.0–2.0)
Bicarbonate: 24 mEq/L (ref 20.0–24.0)
O2 Saturation: 27 %
TCO2: 25 mmol/L (ref 0–100)
pCO2, Ven: 45.4 mmHg (ref 45.0–50.0)
pH, Ven: 7.33 — ABNORMAL HIGH (ref 7.250–7.300)
pO2, Ven: 19 mmHg — CL (ref 30.0–45.0)

## 2013-05-19 LAB — COMPREHENSIVE METABOLIC PANEL
ALT: 32 U/L (ref 0–53)
AST: 36 U/L (ref 0–37)
Albumin: 4.1 g/dL (ref 3.5–5.2)
Alkaline Phosphatase: 103 U/L (ref 39–117)
BUN: 21 mg/dL (ref 6–23)
CO2: 20 mEq/L (ref 19–32)
Calcium: 9.2 mg/dL (ref 8.4–10.5)
Chloride: 93 mEq/L — ABNORMAL LOW (ref 96–112)
Creatinine, Ser: 1.11 mg/dL (ref 0.50–1.35)
GFR calc Af Amer: >90 mL/min (ref >90)
GFR calc non Af Amer: 78 mL/min — ABNORMAL LOW (ref >90)
Glucose, Bld: 728 mg/dL (ref 70–99)
Potassium: 4.5 mEq/L (ref 3.7–5.3)
Sodium: 132 mEq/L — ABNORMAL LOW (ref 137–147)
Total Bilirubin: 0.2 mg/dL — ABNORMAL LOW (ref 0.3–1.2)
Total Protein: 7.1 g/dL (ref 6.0–8.3)

## 2013-05-19 MED ORDER — PRAVASTATIN SODIUM 10 MG PO TABS: 10.0000 mg | ORAL_TABLET | Freq: Every day | ORAL | Status: DC

## 2013-05-19 MED ORDER — SODIUM CHLORIDE 0.9 % IV BOLUS (SEPSIS)
2000.0000 mL | Freq: Once | INTRAVENOUS | Status: AC
  Administered 2013-05-19: 2000 mL via INTRAVENOUS

## 2013-05-19 MED ORDER — GLIPIZIDE 5 MG PO TABS: 5.0000 mg | ORAL_TABLET | Freq: Two times a day (BID) | ORAL | Status: DC

## 2013-05-19 MED ORDER — LISINOPRIL 10 MG PO TABS: 10.0000 mg | ORAL_TABLET | Freq: Every day | ORAL | Status: DC

## 2013-05-19 MED ORDER — DEXTROSE-NACL 5-0.45 % IV SOLN
INTRAVENOUS | Status: DC
  Administered 2013-05-19: 22:00:00 via INTRAVENOUS

## 2013-05-19 MED ORDER — METFORMIN HCL 1000 MG PO TABS: 1000.0000 mg | ORAL_TABLET | Freq: Two times a day (BID) | ORAL | Status: DC

## 2013-05-19 MED ORDER — SODIUM CHLORIDE 0.9 % IV SOLN
INTRAVENOUS | Status: DC
  Administered 2013-05-19: 4.8 [IU]/h via INTRAVENOUS
  Filled 2013-05-19: qty 1

## 2013-05-19 NOTE — Discharge Instructions (Signed)
Return here as needed. Follow up with your doctor. INcrease

## 2013-05-19 NOTE — ED Notes (Signed)
Pt reports out of his diabetes medication for "a while" and he went to family practice today and they told him to come immediately to ed for high blood sugar. He reports he has felt very thirsty this week. He denies pain

## 2013-05-19 NOTE — ED Provider Notes (Signed)
CSN: [id number]     Arrival date & time 05/19/13  1526 History   First MD Initiated Contact with Patient 05/19/13 1806     Chief Complaint  Patient presents with  . Hyperglycemia     (Consider location/radiation/quality/duration/timing/severity/associated sxs/prior Treatment) HPI James Bradford is a [age] yom with pmhx of DMII who presents tonight with hyperglycemia.  Pt reports he has been out of his Rx Metformin, Glucotrol, Lisinopril, Pravastatin since November due to not being able to afford a PCP visit.  He reports he went to his PCP clinic today for routine evaluation, and the clinic sent him to the ER due to hyperglycemia.  Pt reports his only complaint is some gradually worsening vision to the point that he is having difficulty reading small print along with increased thirst and polyuria, however denies nausea, vomiting, dizziness, dysuria, change in appetite.  Pt states he has f/u with PCP next Thursday, 05/26/13.    Past Medical History  Diagnosis Date  . Diabetes mellitus without complication   . Hypertension    History reviewed. No pertinent past surgical history. History reviewed. No pertinent family history. History  Substance Use Topics  . Smoking status: Never Smoker   . Smokeless tobacco: Not on file  . Alcohol Use: No    Review of Systems  Constitutional: Negative for chills, activity change, appetite change and fatigue.  Eyes: Negative for photophobia and pain.  Respiratory: Negative for shortness of breath.   Cardiovascular: Negative for chest pain and leg swelling.  Gastrointestinal: Negative for nausea, vomiting, abdominal pain, diarrhea and constipation.  Genitourinary: Negative for dysuria and decreased urine volume.  Skin: Negative for pallor.  Neurological: Negative for dizziness, syncope, light-headedness and headaches.  Psychiatric/Behavioral: Negative.       Allergies  Aspirin  Home Medications   Prior to Admission medications   Not on File   BP  106/79  Pulse 80  Temp(Src) 98.7 F (37.1 C) (Oral)  Resp 16  Ht 5\' 7"  (1.702 m)  Wt 174 lb (78.926 kg)  BMI 27.25 kg/m2  SpO2 100% Physical Exam  Nursing note and vitals reviewed. Constitutional: He is oriented to person, place, and time. He appears well-developed and well-nourished. No distress.  HENT:  Head: Normocephalic and atraumatic.  Mouth/Throat: Oropharynx is clear and moist.  Eyes: Pupils are equal, round, and reactive to light. No scleral icterus.  Neck: Normal range of motion. Neck supple. No JVD present.  Cardiovascular: Normal rate, regular rhythm, S1 normal, S2 normal and normal heart sounds.  Exam reveals no gallop.   No murmur heard. Pulses:      Radial pulses are 2+ on the right side, and 2+ on the left side.       Dorsalis pedis pulses are 2+ on the right side, and 2+ on the left side.  Pulmonary/Chest: Effort normal and breath sounds normal. No accessory muscle usage. Not tachypneic. No respiratory distress.  Abdominal: Soft. Normal appearance and bowel sounds are normal. There is no tenderness.  Neurological: He is alert and oriented to person, place, and time. He exhibits normal muscle tone. Coordination normal.  Skin: Skin is warm and dry. No rash noted. He is not diaphoretic. No erythema.  Psychiatric: He has a normal mood and affect. His behavior is normal. Judgment and thought content normal.    ED Course  Procedures (including critical care time) Labs Review Labs Reviewed  CBC - Abnormal; Notable for the following:    RBC 3.74 (*)    Hemoglobin  11.6 (*)    HCT 34.5 (*)    All other components within normal limits  COMPREHENSIVE METABOLIC PANEL - Abnormal; Notable for the following:    Sodium 132 (*)    Chloride 93 (*)    Glucose, Bld 728 (*)    Total Bilirubin 0.2 (*)    GFR calc non Af Amer 78 (*)    All other components within normal limits  CBG MONITORING, ED - Abnormal; Notable for the following:    Glucose-Capillary 597 (*)    All other  components within normal limits  I-STAT VENOUS BLOOD GAS, ED - Abnormal; Notable for the following:    pH, Ven 7.330 (*)    pO2, Ven 19.0 (*)    All other components within normal limits  CBG MONITORING, ED - Abnormal; Notable for the following:    Glucose-Capillary 544 (*)    All other components within normal limits  CBG MONITORING, ED - Abnormal; Notable for the following:    Glucose-Capillary 287 (*)    All other components within normal limits  CBG MONITORING, ED - Abnormal; Notable for the following:    Glucose-Capillary 133 (*)    All other components within normal limits      Patient's anion gap is 15.  The patient is mentating appropriately without difficulty.  The patient does not appear to be in any significant distress.  The patient's pH is also within normal limits.  Patient's bicarbonate is also normal.  Patient would like to go home and followup with his primary care Dr. patient is given his diabetes medications.  He is told to return here as needed.  The patient was hydrated with 3 L of normal saline, and placed on a glucose stabilizer  Carlyle Dollyhristopher W Arielys Wandersee, PA-C 05/21/13 [phone]

## 2013-05-25 NOTE — ED Provider Notes (Signed)
Medical screening examination/treatment/procedure(s) were performed by non-physician practitioner and as supervising physician I was immediately available for consultation/collaboration.   EKG Interpretation None       Raeford RazorStephen Rontrell Moquin, MD 05/25/13 [phone]

## 2013-08-23 ENCOUNTER — Encounter (HOSPITAL_COMMUNITY): Payer: Self-pay | Admitting: Emergency Medicine

## 2013-08-23 ENCOUNTER — Emergency Department (HOSPITAL_COMMUNITY): Payer: No Typology Code available for payment source

## 2013-08-23 ENCOUNTER — Emergency Department (HOSPITAL_COMMUNITY)
Admission: EM | Admit: 2013-08-23 | Discharge: 2013-08-23 | Disposition: A | Discharge status: Home / Self Care | Payer: No Typology Code available for payment source | Attending: Emergency Medicine | Admitting: Emergency Medicine

## 2013-08-23 DIAGNOSIS — I10 Essential (primary) hypertension: Secondary | ICD-10-CM | POA: Insufficient documentation

## 2013-08-23 DIAGNOSIS — R51 Headache: Secondary | ICD-10-CM

## 2013-08-23 DIAGNOSIS — Z79899 Other long term (current) drug therapy: Secondary | ICD-10-CM | POA: Diagnosis not present

## 2013-08-23 DIAGNOSIS — IMO0002 Reserved for concepts with insufficient information to code with codable children: Secondary | ICD-10-CM | POA: Insufficient documentation

## 2013-08-23 DIAGNOSIS — E119 Type 2 diabetes mellitus without complications: Secondary | ICD-10-CM | POA: Diagnosis not present

## 2013-08-23 DIAGNOSIS — T148XXA Other injury of unspecified body region, initial encounter: Secondary | ICD-10-CM

## 2013-08-23 DIAGNOSIS — S0990XA Unspecified injury of head, initial encounter: Secondary | ICD-10-CM | POA: Insufficient documentation

## 2013-08-23 DIAGNOSIS — R519 Headache, unspecified: Secondary | ICD-10-CM

## 2013-08-23 DIAGNOSIS — M25562 Pain in left knee: Secondary | ICD-10-CM

## 2013-08-23 MED ORDER — TETRACAINE HCL 0.5 % OP SOLN
2.0000 [drp] | Freq: Once | OPHTHALMIC | Status: AC
  Administered 2013-08-23: 2 [drp] via OPHTHALMIC
  Filled 2013-08-23: qty 2

## 2013-08-23 MED ORDER — FLUORESCEIN SODIUM 1 MG OP STRP
1.0000 | ORAL_STRIP | Freq: Once | OPHTHALMIC | Status: AC
  Administered 2013-08-23: 1 via OPHTHALMIC
  Filled 2013-08-23: qty 1

## 2013-08-23 MED ORDER — HYDROCODONE-ACETAMINOPHEN 5-325 MG PO TABS
1.0000 | ORAL_TABLET | Freq: Once | ORAL | Status: AC
  Administered 2013-08-23: 1 via ORAL
  Filled 2013-08-23: qty 1

## 2013-08-23 MED ORDER — HYDROCODONE-ACETAMINOPHEN 5-325 MG PO TABS
1.0000 | ORAL_TABLET | ORAL | Status: DC | PRN
Start: 2013-08-23 — End: 2014-06-24
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
## 2013-08-25 NOTE — ED Provider Notes (Signed)
Medical screening examination/treatment/procedure(s) were performed by non-physician practitioner and as supervising physician I was immediately available for consultation/collaboration.   EKG Interpretation None        Layla MawKristen N Samuella Rasool, DO 08/25/13 1102

## 2014-06-13 ENCOUNTER — Emergency Department (HOSPITAL_COMMUNITY)
Admission: EM | Admit: 2014-06-13 | Discharge: 2014-06-13 | Disposition: A | Discharge status: Home / Self Care | Payer: No Typology Code available for payment source | Attending: Emergency Medicine | Admitting: Emergency Medicine

## 2014-06-13 ENCOUNTER — Encounter (HOSPITAL_COMMUNITY): Payer: Self-pay | Admitting: *Deleted

## 2014-06-13 ENCOUNTER — Emergency Department (HOSPITAL_COMMUNITY): Payer: No Typology Code available for payment source

## 2014-06-13 DIAGNOSIS — Y9289 Other specified places as the place of occurrence of the external cause: Secondary | ICD-10-CM | POA: Insufficient documentation

## 2014-06-13 DIAGNOSIS — E119 Type 2 diabetes mellitus without complications: Secondary | ICD-10-CM | POA: Insufficient documentation

## 2014-06-13 DIAGNOSIS — Z79899 Other long term (current) drug therapy: Secondary | ICD-10-CM | POA: Insufficient documentation

## 2014-06-13 DIAGNOSIS — I10 Essential (primary) hypertension: Secondary | ICD-10-CM | POA: Insufficient documentation

## 2014-06-13 DIAGNOSIS — W1839XA Other fall on same level, initial encounter: Secondary | ICD-10-CM | POA: Insufficient documentation

## 2014-06-13 DIAGNOSIS — S42252A Displaced fracture of greater tuberosity of left humerus, initial encounter for closed fracture: Secondary | ICD-10-CM | POA: Insufficient documentation

## 2014-06-13 DIAGNOSIS — Y998 Other external cause status: Secondary | ICD-10-CM | POA: Insufficient documentation

## 2014-06-13 DIAGNOSIS — S42202A Unspecified fracture of upper end of left humerus, initial encounter for closed fracture: Secondary | ICD-10-CM

## 2014-06-13 DIAGNOSIS — S40012A Contusion of left shoulder, initial encounter: Secondary | ICD-10-CM | POA: Insufficient documentation

## 2014-06-13 DIAGNOSIS — Y9389 Activity, other specified: Secondary | ICD-10-CM | POA: Insufficient documentation

## 2014-06-13 MED ORDER — OXYCODONE-ACETAMINOPHEN 5-325 MG PO TABS: 2.0000 | ORAL_TABLET | ORAL | Status: DC | PRN

## 2014-06-13 MED ORDER — OXYCODONE-ACETAMINOPHEN 5-325 MG PO TABS
2.0000 | ORAL_TABLET | Freq: Once | ORAL | Status: AC
  Administered 2014-06-13: 2 via ORAL
  Filled 2014-06-13: qty 2

## 2014-06-13 NOTE — ED Provider Notes (Signed)
CSN: [id number]     Arrival date & time 06/13/14  1216 History  This chart was scribed for Emilia BeckKaitlyn Jaevon Paras, PA-C working with Bethann BerkshireJoseph Zammit, MD by Evon Slackerrance Branch, ED Scribe. This patient was seen in room TR09C/TR09C and the patient's care was started at 1:49 PM.    Chief Complaint  Patient presents with  . Shoulder Injury   Patient is a 49 y.o. male presenting with shoulder injury. The history is provided by the patient. No language interpreter was used.  Shoulder Injury   HPI Comments: James Bradford is a 49 y.o. male who presents to the Emergency Department complaining of left shoulder injury onset 8 days prior. Pt states that he injured the shoulder during a fall while holding the pallet jack. Pt states that he fell onto his out stretched left hand. Pt states that this injury happened in [place] where he was initially medically evaluated ant told that he had a fracture. Pt states that he was placed in splint but states that he removed because it was uncomfortable and caused bruising to his left arm. Pt states that he was prescribed pain medication that he has recently ran out. Pt denies numbness or tingling. Pt denies head injury or LOC.    Past Medical History  Diagnosis Date  . Diabetes mellitus without complication   . Hypertension    History reviewed. No pertinent past surgical history. History reviewed. No pertinent family history. History  Substance Use Topics  . Smoking status: Never Smoker   . Smokeless tobacco: Not on file  . Alcohol Use: No    Review of Systems  Musculoskeletal: Positive for arthralgias.  All other systems reviewed and are negative.    Allergies  Aspirin  Home Medications   Prior to Admission medications   Medication Sig Start Date End Date Taking? Authorizing Provider  glipiZIDE (GLUCOTROL XL) 5 MG 24 hr tablet Take 5 mg by mouth daily with breakfast.    Historical Provider, MD  HYDROcodone-acetaminophen (NORCO/VICODIN) 5-325 MG per tablet  Take 1-2 tablets by mouth every 4 (four) hours as needed. 08/23/13   Heather Laisure, PA-C  Ibuprofen-Diphenhydramine HCl (ADVIL PM) 200-25 MG CAPS Take 2 tablets by mouth at bedtime as needed (headache).    Historical Provider, MD  lisinopril (PRINIVIL,ZESTRIL) 10 MG tablet Take 1 tablet (10 mg total) by mouth daily. 05/19/13   Charlestine Nighthristopher Lawyer, PA-C  metFORMIN (GLUCOPHAGE) 1000 MG tablet Take 1 tablet (1,000 mg total) by mouth 2 (two) times daily with a meal. 05/19/13   Charlestine Nighthristopher Lawyer, PA-C  Multiple Vitamin (MULTIVITAMIN WITH MINERALS) TABS tablet Take 1 tablet by mouth daily.    Historical Provider, MD  pravastatin (PRAVACHOL) 10 MG tablet Take 1 tablet (10 mg total) by mouth daily. 05/19/13   Christopher Lawyer, PA-C   BP 104/79 mmHg  Pulse 94  Temp(Src) 98.4 F (36.9 C) (Oral)  Resp 18  Ht 5\' 7"  (1.702 m)  Wt 175 lb (79.379 kg)  BMI 27.40 kg/m2  SpO2 100%   Physical Exam  Constitutional: He is oriented to person, place, and time. He appears well-developed and well-nourished. No distress.  HENT:  Head: Normocephalic and atraumatic.  Eyes: Conjunctivae and EOM are normal.  Neck: Neck supple. No tracheal deviation present.  Cardiovascular: Normal rate and intact distal pulses.   Pulmonary/Chest: Effort normal. No respiratory distress.  Abdominal: Soft. He exhibits no distension. There is no tenderness. There is no rebound.  Musculoskeletal: Normal range of motion. He exhibits tenderness.  Anterior left tenderness  to palpation, no swelling or obvious deformity, slightly limited ROM due to pain, large area of bruising over the bicep.   Neurological: He is alert and oriented to person, place, and time. Coordination normal.  Skin: Skin is warm and dry.  Psychiatric: He has a normal mood and affect. His behavior is normal.  Nursing note and vitals reviewed.   ED Course  Procedures (including critical care time) DIAGNOSTIC STUDIES: Oxygen Saturation is 100% on RA, normal by my  interpretation.    COORDINATION OF CARE: 2:05 PM-Discussed treatment plan with pt at bedside and pt agreed to plan.     Labs Review Labs Reviewed - No data to display  Imaging Review Dg Clavicle Left  06/13/2014   CLINICAL DATA:  Injury 1 week ago, continued pain LEFT shoulder and proximal humerus  EXAM: LEFT CLAVICLE - 2+ VIEWS  COMPARISON:  None  FINDINGS: AC joint alignment normal.  Osseous mineralization normal.  Displaced fracture greater tuberosity LEFT humerus, margins slightly indistinct compatible with subacute AH.  No additional fracture, dislocation or bone destruction identified.  IMPRESSION: Displaced subacute greater tuberosity fracture LEFT humerus.   Electronically Signed   By: Ulyses SouthwardMark  Boles M.D.   On: 06/13/2014 13:45   Dg Shoulder Left  06/13/2014   CLINICAL DATA:  Pain following injury 1 week prior  EXAM: LEFT SHOULDER - 2+ VIEW  COMPARISON:  None.  FINDINGS: Frontal and Y scapular images were obtained. There is a fracture of the greater tuberosity on the left with mild lateral displacement of the greater tuberosity with respect to the remainder of the humerus. There are 2 small avulsed fragments along the lateral humeral head just superior to the a avulsed greater tuberosity. No dislocation. Joint spaces appear intact. No erosive change.  IMPRESSION: Comminuted fracture of the lateral aspect of the proximal humerus with avulsion of the greater tuberosity. No dislocation. No appreciable arthropathy.   Electronically Signed   By: Bretta BangWilliam  Woodruff III M.D.   On: 06/13/2014 13:45     EKG Interpretation None      MDM   Final diagnoses:  Proximal humerus fracture, left, closed, initial encounter    2:28 PM Xray shows proximal humerus fracture. Patient will have sling and percocet. Patient will have follow up with Dr. Eulah PontMurphy tomorrow morning. No neurovascular compromise.   I personally performed the services described in this documentation, which was scribed in my  presence. The recorded information has been reviewed and is accurate.      Emilia BeckKaitlyn Jaylea Plourde, PA-C 06/13/14 1429  Bethann BerkshireJoseph Zammit, MD 06/14/14 [phone]
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
## 2014-06-13 NOTE — Discharge Instructions (Signed)
Take Percocet as needed for pain. Wear your sling as directed. Follow up with Dr. Eulah PontMurphy tomorrow as scheduled. Refer to attached documents for more information.

## 2014-06-13 NOTE — ED Notes (Signed)
Pt states he fell while holding an industrial "jack" 8 days ago in [place]. States xray showed "fractures" pointing to his clavicle. States was put in a "straight jacket" but has since removed it because it caused a large bruise on left upper arm. Pt is requesting pain meds.

## 2014-06-14 ENCOUNTER — Other Ambulatory Visit: Payer: Self-pay | Admitting: Orthopedic Surgery

## 2014-06-24 ENCOUNTER — Encounter (HOSPITAL_COMMUNITY): Payer: Self-pay | Admitting: Emergency Medicine

## 2014-06-24 ENCOUNTER — Emergency Department (HOSPITAL_COMMUNITY)
Admission: EM | Admit: 2014-06-24 | Discharge: 2014-06-24 | Disposition: A | Discharge status: Home / Self Care | Payer: No Typology Code available for payment source | Attending: Emergency Medicine | Admitting: Emergency Medicine

## 2014-06-24 DIAGNOSIS — S42202S Unspecified fracture of upper end of left humerus, sequela: Secondary | ICD-10-CM | POA: Insufficient documentation

## 2014-06-24 DIAGNOSIS — W1839XS Other fall on same level, sequela: Secondary | ICD-10-CM | POA: Insufficient documentation

## 2014-06-24 DIAGNOSIS — S42302S Unspecified fracture of shaft of humerus, left arm, sequela: Secondary | ICD-10-CM

## 2014-06-24 DIAGNOSIS — I10 Essential (primary) hypertension: Secondary | ICD-10-CM | POA: Insufficient documentation

## 2014-06-24 DIAGNOSIS — Z79899 Other long term (current) drug therapy: Secondary | ICD-10-CM | POA: Insufficient documentation

## 2014-06-24 DIAGNOSIS — E119 Type 2 diabetes mellitus without complications: Secondary | ICD-10-CM | POA: Insufficient documentation

## 2014-06-24 MED ORDER — HYDROCODONE-ACETAMINOPHEN 5-325 MG PO TABS: 2.0000 | ORAL_TABLET | ORAL | Status: DC | PRN

## 2014-06-24 NOTE — ED Notes (Signed)
Patient was unloading a truck when he slipped and fell injuring his left shoulder.  Patient reports it is still sore to the touch and is aching.  [facility] and was referred to an orthopedic clinic.  Patient has healing bruises and slight swelling to the area.  Patient walked out of orthopedic clinic due to the long wait to get a CT.

## 2014-06-24 NOTE — ED Provider Notes (Signed)
CSN: [id number]     Arrival date & time 06/24/14  1012 History   First MD Initiated Contact with Patient 06/24/14 1029     Chief Complaint  Patient presents with  . Shoulder Injury     (Consider location/radiation/quality/duration/timing/severity/associated sxs/prior Treatment) Patient is a 49 y.o. male presenting with shoulder injury. The history is provided by the patient. No language interpreter was used.  Shoulder Injury This is a new problem. Episode onset: 19 days ago. The problem occurs constantly. The problem has been unchanged. Nothing aggravates the symptoms. He has tried nothing for the symptoms. The treatment provided no relief.  Pt reports he had problems seeing orthopaedist due to insurance. Pt was told he might need surgery.   Pt complains of continued pain.  Pt request referral to a different Orthopaedist  Past Medical History  Diagnosis Date  . Diabetes mellitus without complication   . Hypertension    Past Surgical History  Procedure Laterality Date  . Hand surgery    . Leg surgery     History reviewed. No pertinent family history. History  Substance Use Topics  . Smoking status: Never Smoker   . Smokeless tobacco: Not on file  . Alcohol Use: No    Review of Systems  All other systems reviewed and are negative.     Allergies  Aspirin  Home Medications   Prior to Admission medications   Medication Sig Start Date End Date Taking? Authorizing Provider  glipiZIDE (GLUCOTROL XL) 5 MG 24 hr tablet Take 5 mg by mouth daily with breakfast.    Historical Provider, MD  HYDROcodone-acetaminophen (NORCO/VICODIN) 5-325 MG per tablet Take 2 tablets by mouth every 4 (four) hours as needed. 06/24/14   Elson AreasLeslie K Yasmyn Bellisario, PA-C  Ibuprofen-Diphenhydramine HCl (ADVIL PM) 200-25 MG CAPS Take 2 tablets by mouth at bedtime as needed (headache).    Historical Provider, MD  lisinopril (PRINIVIL,ZESTRIL) 10 MG tablet Take 1 tablet (10 mg total) by mouth daily. 05/19/13    Charlestine Nighthristopher Lawyer, PA-C  metFORMIN (GLUCOPHAGE) 1000 MG tablet Take 1 tablet (1,000 mg total) by mouth 2 (two) times daily with a meal. 05/19/13   Charlestine Nighthristopher Lawyer, PA-C  Multiple Vitamin (MULTIVITAMIN WITH MINERALS) TABS tablet Take 1 tablet by mouth daily.    Historical Provider, MD  pravastatin (PRAVACHOL) 10 MG tablet Take 1 tablet (10 mg total) by mouth daily. 05/19/13   Christopher Lawyer, PA-C   BP 110/64 mmHg  Pulse 70  Temp(Src) 98 F (36.7 C) (Oral)  SpO2 100% Physical Exam  Constitutional: He is oriented to person, place, and time. He appears well-developed and well-nourished.  HENT:  Head: Normocephalic and atraumatic.  Eyes: EOM are normal. Pupils are equal, round, and reactive to light.  Neck: Normal range of motion.  Cardiovascular: Normal rate.   Pulmonary/Chest: Effort normal.  Abdominal: He exhibits no distension.  Musculoskeletal: He exhibits tenderness.  Pain with movement right shoulder.  nv and ns intact  Neurological: He is alert and oriented to person, place, and time.  Psychiatric: He has a normal mood and affect.  Nursing note and vitals reviewed.   ED Course  Procedures (including critical care time) Labs Review Labs Reviewed - No data to display  Imaging Review No results found.   EKG Interpretation None      MDM   Final diagnoses:  Humerus fracture, left, sequela    Hydrocodone Schedule to see Dr. Lajoyce Cornersuda for evaluation     Elson AreasLeslie K Karna Abed, PA-C 06/24/14 1051  Ivin BootyJoshua  Jodi Mourning, MD 06/24/14 [phone]

## 2014-06-24 NOTE — Discharge Instructions (Signed)
Shoulder Fracture °You have a fractured humerus (bone in the upper arm) at the shoulder just below the ball of the shoulder joint. Most of the time the bones of a broken shoulder are in an acceptable position. Usually the injury can be treated with a shoulder immobilizer or sling and swath bandage. These devices support the arm and prevent any shoulder movement. If the bones are not in a good position, then surgery is sometimes needed. Shoulder fractures usually cause swelling, pain, and discoloration around the upper arm initially. They heal in 8-12 weeks with proper treatment. °Rest in bed or a reclining chair as long as your shoulder is very painful. Sitting up generally results in less pain at the fracture site. Do not remove your shoulder bandage until your caregiver approves. You may apply ice packs over the shoulder for 20-30 minutes every 2 hours for the next 2-3 days to reduce the pain and swelling. Use your pain medicine as prescribed.  °SEEK IMMEDIATE MEDICAL CARE IF: °· You develop severe shoulder pain unrelieved by rest and taking pain medicine. °· You have pain, numbness, tingling, or weakness in the hand or wrist. °· You develop shortness of breath, chest pain, severe weakness, or fainting. °· You have severe pain with motion of the fingers or wrist. °MAKE SURE YOU:  °· Understand these instructions. °· Will watch your condition. °· Will get help right away if you are not doing well or get worse. °Document Released: 02/28/2004 Document Revised: 04/14/2011 Document Reviewed: 05/10/2008 °ExitCare® Patient Information ©2015 [employer]. This information is not intended to replace advice given to you by your health care provider. Make sure you discuss any questions you have with your health care provider. ° °

## 2014-07-11 ENCOUNTER — Encounter (HOSPITAL_COMMUNITY): Payer: Self-pay | Admitting: Physical Medicine and Rehabilitation

## 2014-07-11 ENCOUNTER — Emergency Department (HOSPITAL_COMMUNITY): Payer: Worker's Compensation

## 2014-07-11 ENCOUNTER — Emergency Department (HOSPITAL_COMMUNITY)
Admission: EM | Admit: 2014-07-11 | Discharge: 2014-07-11 | Disposition: A | Discharge status: Home / Self Care | Payer: Worker's Compensation | Attending: Emergency Medicine | Admitting: Emergency Medicine

## 2014-07-11 DIAGNOSIS — Z79899 Other long term (current) drug therapy: Secondary | ICD-10-CM | POA: Insufficient documentation

## 2014-07-11 DIAGNOSIS — I10 Essential (primary) hypertension: Secondary | ICD-10-CM | POA: Insufficient documentation

## 2014-07-11 DIAGNOSIS — S4992XS Unspecified injury of left shoulder and upper arm, sequela: Secondary | ICD-10-CM

## 2014-07-11 DIAGNOSIS — E119 Type 2 diabetes mellitus without complications: Secondary | ICD-10-CM | POA: Insufficient documentation

## 2014-07-11 DIAGNOSIS — Y30XXXA Falling, jumping or pushed from a high place, undetermined intent, initial encounter: Secondary | ICD-10-CM | POA: Insufficient documentation

## 2014-07-11 MED ORDER — NAPROXEN 375 MG PO TABS: 375.0000 mg | ORAL_TABLET | Freq: Two times a day (BID) | ORAL | Status: DC

## 2014-07-11 MED ORDER — OXYCODONE-ACETAMINOPHEN 5-325 MG PO TABS
1.0000 | ORAL_TABLET | Freq: Once | ORAL | Status: AC
  Administered 2014-07-11: 1 via ORAL
  Filled 2014-07-11: qty 1

## 2014-07-11 NOTE — ED Notes (Signed)
Pt presents to department for evaluation of L shoulder pain. Reports he fell on 5/2 and injured L shoulder at work. Now reports increased pain, ran out of pain medication at home. Pt is alert and oriented x4.

## 2014-07-11 NOTE — ED Provider Notes (Signed)
CSN: [id number]     Arrival date & time 07/11/14  0716 History   First MD Initiated Contact with Patient 07/11/14 [phone]     Chief Complaint  Patient presents with  . Shoulder Pain     Patient is a 49 y.o. male presenting with shoulder pain. The history is provided by the patient. No language interpreter was used.  Shoulder Pain  Mr. James Bradford presents for evaluation of left shoulder pain. He reports that on May 10 he fell off a truck and landed onto his left shoulder and sustained a fracture. He attempted to follow up with Dr. Eulah PontMurphy that he left the office and frustration. He reports that since that time he's had progressive left shoulder pain that now radiates into the left neck and face. The pain is worse in the morning and is worse with range of motion. He denies any fevers, chest pain, difficulty breathing, abdominal pain. He is a type II diabetic denies any additional medical problems. Symptoms are moderate, constant, worsening. He has follow-up scheduled for June 14.  Past Medical History  Diagnosis Date  . Diabetes mellitus without complication   . Hypertension    Past Surgical History  Procedure Laterality Date  . Hand surgery    . Leg surgery     No family history on file. History  Substance Use Topics  . Smoking status: Never Smoker   . Smokeless tobacco: Not on file  . Alcohol Use: No    Review of Systems  All other systems reviewed and are negative.     Allergies  Aspirin  Home Medications   Prior to Admission medications   Medication Sig Start Date End Date Taking? Authorizing Provider  glipiZIDE (GLUCOTROL XL) 5 MG 24 hr tablet Take 5 mg by mouth daily with breakfast.    Historical Provider, MD  HYDROcodone-acetaminophen (NORCO/VICODIN) 5-325 MG per tablet Take 2 tablets by mouth every 4 (four) hours as needed. 06/24/14   Elson AreasLeslie K Sofia, PA-C  Ibuprofen-Diphenhydramine HCl (ADVIL PM) 200-25 MG CAPS Take 2 tablets by mouth at bedtime as needed (headache).     Historical Provider, MD  lisinopril (PRINIVIL,ZESTRIL) 10 MG tablet Take 1 tablet (10 mg total) by mouth daily. 05/19/13   Charlestine Nighthristopher Lawyer, PA-C  metFORMIN (GLUCOPHAGE) 1000 MG tablet Take 1 tablet (1,000 mg total) by mouth 2 (two) times daily with a meal. 05/19/13   Charlestine Nighthristopher Lawyer, PA-C  Multiple Vitamin (MULTIVITAMIN WITH MINERALS) TABS tablet Take 1 tablet by mouth daily.    Historical Provider, MD  pravastatin (PRAVACHOL) 10 MG tablet Take 1 tablet (10 mg total) by mouth daily. 05/19/13   Christopher Lawyer, PA-C   BP 114/78 mmHg  Pulse 82  Temp(Src) 98.7 F (37.1 C) (Oral)  Resp 18  Ht 5\' 7"  (1.702 m)  Wt 167 lb (75.751 kg)  BMI 26.15 kg/m2  SpO2 100% Physical Exam  Constitutional: He is oriented to person, place, and time. He appears well-developed and well-nourished.  HENT:  Head: Normocephalic and atraumatic.  Cardiovascular: Normal rate.   Pulmonary/Chest: Effort normal. No respiratory distress.  Musculoskeletal:  Mild tenderness over the left upper shoulder. Unable to range shoulder above head.  2+ radial pulses.  No erythema or edema.    Neurological: He is alert and oriented to person, place, and time.  5/5 grip strength in BUE.  Sensation to light touch intact in bilateral hands.   Skin: Skin is warm and dry.  Psychiatric: He has a normal mood and affect. His behavior  is normal.  Nursing note and vitals reviewed.   ED Course  Procedures (including critical care time) Labs Review Labs Reviewed - No data to display  Imaging Review Dg Shoulder Left  07/11/2014   CLINICAL DATA:  Left shoulder pain, history of proximal humeral fracture  EXAM: LEFT SHOULDER - 2+ VIEW  COMPARISON:  06/13/2014  FINDINGS: There again noted changes consistent with the fracture through the greater tuberosity of the proximal left humerus. The dominant fracture fragment has shown some increased fragmentation when compared with the prior exam. No new fracture is seen.  IMPRESSION: Increased  comminution of a greater tuberosity fracture in the proximal humerus   Electronically Signed   By: Alcide Clever M.D.   On: 07/11/2014 08:24     EKG Interpretation None      MDM   Final diagnoses:  Shoulder injury, left, sequela    Patient with history of shoulder injury one month ago here for continued pain. No evidence of dislocation or infection in the department. Discussed importance of orthopedic follow-up. Recommend NSAIDs for pain, providing prescription for naproxen. Consultation on not taking over-the-counter ibuprofen or Aleve while taking naproxen.    Tilden Fossa, MD 07/11/14 [phone]

## 2014-07-11 NOTE — Discharge Instructions (Signed)
Shoulder Fracture (Proximal Humerus or Glenoid) °A shoulder fracture is a broken upper arm bone or a broken socket bone. The humerus is the upper arm bone and the glenoid is the shoulder socket. Proximal means the humerus is broken near the shoulder. Most of the time the bones of a broken shoulder are in an acceptable position. Usually, the injury can be treated with a shoulder immobilizer or sling and swath bandage. These devices support the arm and prevent any shoulder movement. If the bones are not in a good position, then surgery is sometimes needed. Shoulder fractures usually initially cause swelling, pain, and discoloration around the upper arm. They heal in 8 to 12 weeks with proper treatment. °SYMPTOMS  °At the time of injury: °· Pain. °· Tenderness. °· Regular body contours are not normal. °Later symptoms may include: °· Swelling and bruising of the elbow and hand. °· Swelling and bruising of the arm or chest. °Other symptoms include: °· Pain when lifting or turning the arm. °· Paralysis below the fracture. °· Numbness or coldness below the fracture. °CAUSES  °· Indirect force from falling on an outstretched arm. °· A blow to the shoulder. °RISK INCREASES WITH: °· Not being in shape. °· Playing contact sports, such as football, soccer, hockey, or rugby. °· Sports where falling on an outstretched arm occurs, such as basketball, skateboarding, or volleyball. °· History of bone or joint disease. °· History of shoulder injury. °PREVENTION °· Warm up before activity. °· Stretch before activity. °· Stay in shape with your: °¨ Heart fitness. °¨ Flexibility. °¨ Shoulder Strength. °· Falling with the proper technique. °PROGNOSIS  °In adults, healing time is about 7 weeks. For children, healing time is about 5 weeks. Surgery may be needed. °RELATED COMPLICATIONS °· The bones do not heal together (nonunion). °· The bones do not align properly when they heal (malunion). °· Long-term problems with pain, stiffness,  swelling, or loss of motion. °· The injured arm heals shorter than the other. °· Nerves are injured in the arm. °· Arthritis in the shoulder. °· Normal bone growth is interrupted in children. °· Blood supply to the shoulder joint is diminished. °TREATMENT °If the bones are aligned, then initial treatment will be with ice and medicine to help with pain. The shoulder will be held in place with a sling (immobilization). The shoulder will be allowed to heal for up to 6 weeks. Injuries that may need surgery include: °· Severe fractures. °· Fractures that are not in appropriate alignment (displaced). °· Non-displaced fractures (not common). °Surgery helps the bones align correctly. The bones may be held in place with: °· Sutures. °· Wires. °· Rods. °· Plates. °· Screws. °· Pins. °If you have had surgery or not, you will likely be assisted by a physical therapist or athletic trainer to get the best results with your injured shoulder. This will likely include exercises to strengthen and stretch the injured and surrounding areas. °MEDICATION °· If pain medicine is needed, nonsteroidal anti-inflammatory medicines (such as aspirin or ibuprofen) or other minor pain relievers (such as acetaminophen) are often advised. °· Do not take pain medicine for 7 days before surgery. °· Stronger pain relievers may be prescribed. Use only as directed and take only as much as you need. °COLD THERAPY °Cold treatment (icing) relieves pain and reduces inflammation. Cold treatment should be applied for 10 to 15 minutes every 2 to 3 hours, and immediately after activity that aggravates your symptoms. Use ice packs or an ice massage. °SEEK IMMEDIATE   MEDICAL CARE IF: °· You have severe shoulder pain unrelieved by rest and taking pain medicine. °· You have pain, numbness, tingling, or weakness in the hand or wrist. °· You have shortness of breath, chest pain, severe weakness, or fainting. °· You have severe pain with motion of the fingers or  wrist. °· Blue, gray, or dark color appears in the fingernails on injured extremity. °Document Released: 01/20/2005 Document Revised: 04/14/2011 Document Reviewed: 05/04/2008 °ExitCare® Patient Information ©2015 [employer]. This information is not intended to replace advice given to you by your health care provider. Make sure you discuss any questions you have with your health care provider. ° °

## 2014-09-05 ENCOUNTER — Encounter (HOSPITAL_COMMUNITY): Payer: Self-pay | Admitting: Emergency Medicine

## 2014-09-05 ENCOUNTER — Emergency Department (HOSPITAL_COMMUNITY)
Admission: EM | Admit: 2014-09-05 | Discharge: 2014-09-05 | Disposition: A | Discharge status: Home / Self Care | Payer: No Typology Code available for payment source | Attending: Emergency Medicine | Admitting: Emergency Medicine

## 2014-09-05 DIAGNOSIS — R739 Hyperglycemia, unspecified: Secondary | ICD-10-CM

## 2014-09-05 DIAGNOSIS — Z79899 Other long term (current) drug therapy: Secondary | ICD-10-CM | POA: Insufficient documentation

## 2014-09-05 DIAGNOSIS — I10 Essential (primary) hypertension: Secondary | ICD-10-CM | POA: Insufficient documentation

## 2014-09-05 DIAGNOSIS — R51 Headache: Secondary | ICD-10-CM | POA: Insufficient documentation

## 2014-09-05 DIAGNOSIS — R519 Headache, unspecified: Secondary | ICD-10-CM

## 2014-09-05 DIAGNOSIS — E1165 Type 2 diabetes mellitus with hyperglycemia: Secondary | ICD-10-CM | POA: Insufficient documentation

## 2014-09-05 DIAGNOSIS — Z791 Long term (current) use of non-steroidal anti-inflammatories (NSAID): Secondary | ICD-10-CM | POA: Insufficient documentation

## 2014-09-05 LAB — I-STAT CHEM 8, ED
BUN: 6 mg/dL (ref 6–20)
CHLORIDE: 101 mmol/L (ref 101–111)
Calcium, Ion: 1.28 mmol/L — ABNORMAL HIGH (ref 1.12–1.23)
Creatinine, Ser: 0.9 mg/dL (ref 0.61–1.24)
GLUCOSE: 295 mg/dL — AB (ref 65–99)
HCT: 42 % (ref 39.0–52.0)
Hemoglobin: 14.3 g/dL (ref 13.0–17.0)
POTASSIUM: 4.1 mmol/L (ref 3.5–5.1)
Sodium: 139 mmol/L (ref 135–145)
TCO2: 21 mmol/L (ref 0–100)

## 2014-09-05 MED ORDER — PROCHLORPERAZINE MALEATE 5 MG PO TABS
5.0000 mg | ORAL_TABLET | Freq: Once | ORAL | Status: AC
  Administered 2014-09-05: 5 mg via ORAL
  Filled 2014-09-05 (×2): qty 1

## 2014-09-05 MED ORDER — GLIPIZIDE ER 10 MG PO TB24: 10.0000 mg | ORAL_TABLET | Freq: Every day | ORAL | Status: DC

## 2014-09-05 MED ORDER — METFORMIN HCL 1000 MG PO TABS: 1000.0000 mg | ORAL_TABLET | Freq: Two times a day (BID) | ORAL | Status: DC

## 2014-09-05 MED ORDER — DIPHENHYDRAMINE HCL 25 MG PO CAPS
25.0000 mg | ORAL_CAPSULE | Freq: Once | ORAL | Status: AC
  Administered 2014-09-05: 25 mg via ORAL
  Filled 2014-09-05: qty 1

## 2014-09-05 MED ORDER — KETOROLAC TROMETHAMINE 15 MG/ML IJ SOLN
15.0000 mg | Freq: Once | INTRAMUSCULAR | Status: AC
  Administered 2014-09-05: 15 mg via INTRAMUSCULAR
  Filled 2014-09-05: qty 1

## 2014-09-05 NOTE — ED Notes (Signed)
Injured at work on may 2nd, states he broke his left arm in shoulder area and started taking physical therapy, states he is hurting in his head and left eye.  States when he went to physician they told him to get seen by a neurologist in the middle of July.  His worker's compensation handler is supposed to be getting him set up with a neurologist for his arm pain, this was in middle of July.  He hasn't heard back and his attorney told him to come here.

## 2014-09-05 NOTE — Discharge Instructions (Signed)
Please monitor for new or worsening signs or symptoms, return immediately if any present. Please follow-up with [facility] and wants for further evaluation and management.

## 2014-09-05 NOTE — ED Notes (Signed)
Patient refused to allow nurse to get vitals states he is ready to go.

## 2014-09-05 NOTE — ED Provider Notes (Signed)
CSN: [id number]     Arrival date & time 09/05/14  0945 History   First MD Initiated Contact with Patient 09/05/14 [phone]     Chief Complaint  Patient presents with  . Headache   HPI   49 year old male presents today with a headache. Patient reports in May 2016 he had a shoulder injury, reports that after that he has radiation of pain up into the base of his neck with occasional headaches. Patient reports he saw orthopedic specialist 1 month ago who suggested he see a neurologist for the headaches. Patient reports this is a worker's comp issue, was unable to make contact with worker's comp to schedule a neurology follow-up. He reports the headaches are intermittent, doesn't headache started 1 day ago, described as pressure in the left side of his head with radiation down into his eye and through the base of his neck. Patient denies any changes in vision, hearing, focal neurological deficits, fever, neck stiffness. Patient does have left lateral baseline neck pain worse with all range of motion. Patient reports left shoulder pain, this is present since the accident. Patient denies drug use, trauma to the head. Patient additionally notes that he is a type II diabetic, reports that he has not been taking his blood sugar medicine, has not taken in a month as he has not followed up with his primary care. Patient reports he goes to [facility], has a scheduled visit for the end of August. Patient denies changes in vision, increased thirst, increased urination.   Past Medical History  Diagnosis Date  . Diabetes mellitus without complication   . Hypertension    Past Surgical History  Procedure Laterality Date  . Hand surgery    . Leg surgery     No family history on file. History  Substance Use Topics  . Smoking status: Never Smoker   . Smokeless tobacco: Not on file  . Alcohol Use: No    Review of Systems  All other systems reviewed and are negative.     Allergies  Ibuprofen and  Aspirin  Home Medications   Prior to Admission medications   Medication Sig Start Date End Date Taking? Authorizing Provider  lisinopril (PRINIVIL,ZESTRIL) 10 MG tablet Take 1 tablet (10 mg total) by mouth daily. 05/19/13  Yes Charlestine Night, PA-C  naproxen (NAPROSYN) 375 MG tablet Take 1 tablet (375 mg total) by mouth 2 (two) times daily with a meal. 07/11/14  Yes Tilden Fossa, MD  pravastatin (PRAVACHOL) 10 MG tablet Take 1 tablet (10 mg total) by mouth daily. 05/19/13  Yes Christopher Lawyer, PA-C  glipiZIDE (GLUCOTROL XL) 10 MG 24 hr tablet Take 1 tablet (10 mg total) by mouth daily with breakfast. 09/05/14   Eyvonne Mechanic, PA-C  HYDROcodone-acetaminophen (NORCO/VICODIN) 5-325 MG per tablet Take 2 tablets by mouth every 4 (four) hours as needed. Patient not taking: Reported on 09/05/2014 06/24/14   Elson Areas, PA-C  metFORMIN (GLUCOPHAGE) 1000 MG tablet Take 1 tablet (1,000 mg total) by mouth 2 (two) times daily with a meal. 09/05/14   Eyvonne Mechanic, PA-C   BP 122/90 mmHg  Pulse 78  Temp(Src) 97.8 F (36.6 C) (Oral)  Resp 16  Ht 5\' 10"  (1.778 m)  Wt 165 lb (74.844 kg)  BMI 23.68 kg/m2  SpO2 100%   Physical Exam  Constitutional: He is oriented to person, place, and time. He appears well-developed and well-nourished.  HENT:  Head: Normocephalic and atraumatic.  Eyes: Conjunctivae are normal. Pupils are  equal, round, and reactive to light. Right eye exhibits no discharge. Left eye exhibits no discharge. No scleral icterus.  Neck: Normal range of motion. Neck supple. No JVD present. No tracheal deviation present.  Pulmonary/Chest: Effort normal. No stridor.  Musculoskeletal:  Tender to palpation of the left shoulder grossly, painful in all range of motion. Tenderness to palpation of the trapezius to the base of the skull. No obvious trauma or signs of deformity, no obvious signs of infection.  Neurological: He is alert and oriented to person, place, and time. He has normal strength.  No cranial nerve deficit or sensory deficit. He displays a negative Romberg sign. Coordination and gait normal. GCS eye subscore is 4. GCS verbal subscore is 5. GCS motor subscore is 6.  Psychiatric: He has a normal mood and affect. His behavior is normal. Judgment and thought content normal.  Nursing note and vitals reviewed.   ED Course  Procedures (including critical care time) Labs Review Labs Reviewed  I-STAT CHEM 8, ED - Abnormal; Notable for the following:    Glucose, Bld 295 (*)    Calcium, Ion 1.28 (*)    All other components within normal limits    Imaging Review No results found.   EKG Interpretation None      MDM   Final diagnoses:  Headache, unspecified headache type  Hyperglycemia    Labs: I-STAT Chem-8- glucose at 295, creatinine normal  Imaging:  Consults:  Therapeutics: Benadryl, Compazine, Toradol  Discharge Meds: Glipizide, metformin  Assessment/Plan: Patient presents with a headache. History of the same since shoulder injury, this likely represents a tension headache. He has no red flags on exam today. Headache resolved with medications noted above. Patient reports that he has not been taking his glucose stabilization medication, history of significantly elevated hyperglycemia previously. Chem-8 ordered results noted above, patient given prescription for his diabetic medication. Patient has a scheduled appointment with [facility] and wellness, he is encouraged to follow up with them for further evaluation and management of his chronic conditions. Patient will be given referral to neurology for ongoing headaches. He is given strict return precautions, verbalized understanding and agreement for today's plan and has no further questions or concerns at time of discharge.          Eyvonne Mechanic, PA-C 09/05/14 1509  Pricilla Loveless, MD 09/07/14 [phone]

## 2014-12-30 ENCOUNTER — Emergency Department (HOSPITAL_COMMUNITY)
Admission: EM | Admit: 2014-12-30 | Discharge: 2014-12-30 | Discharge status: Against Medical Advice | Payer: No Typology Code available for payment source | Attending: Emergency Medicine | Admitting: Emergency Medicine

## 2014-12-30 ENCOUNTER — Encounter (HOSPITAL_COMMUNITY): Payer: Self-pay | Admitting: Emergency Medicine

## 2014-12-30 DIAGNOSIS — E119 Type 2 diabetes mellitus without complications: Secondary | ICD-10-CM | POA: Insufficient documentation

## 2014-12-30 DIAGNOSIS — F1020 Alcohol dependence, uncomplicated: Secondary | ICD-10-CM

## 2014-12-30 DIAGNOSIS — Z79899 Other long term (current) drug therapy: Secondary | ICD-10-CM | POA: Insufficient documentation

## 2014-12-30 DIAGNOSIS — Z791 Long term (current) use of non-steroidal anti-inflammatories (NSAID): Secondary | ICD-10-CM | POA: Insufficient documentation

## 2014-12-30 DIAGNOSIS — I10 Essential (primary) hypertension: Secondary | ICD-10-CM | POA: Insufficient documentation

## 2014-12-30 LAB — CBC
HCT: 39.9 % (ref 39.0–52.0)
HEMOGLOBIN: 13.7 g/dL (ref 13.0–17.0)
MCH: 32.2 pg (ref 26.0–34.0)
MCHC: 34.3 g/dL (ref 30.0–36.0)
MCV: 93.9 fL (ref 78.0–100.0)
PLATELETS: 287 10*3/uL (ref 150–400)
RBC: 4.25 MIL/uL (ref 4.22–5.81)
RDW: 13.1 % (ref 11.5–15.5)
WBC: 5.8 10*3/uL (ref 4.0–10.5)

## 2014-12-30 LAB — COMPREHENSIVE METABOLIC PANEL
ALK PHOS: 73 U/L (ref 38–126)
ALT: 35 U/L (ref 17–63)
ANION GAP: 12 (ref 5–15)
AST: 29 U/L (ref 15–41)
Albumin: 4.5 g/dL (ref 3.5–5.0)
BUN: 14 mg/dL (ref 6–20)
CALCIUM: 9.5 mg/dL (ref 8.9–10.3)
CO2: 26 mmol/L (ref 22–32)
Chloride: 96 mmol/L — ABNORMAL LOW (ref 101–111)
Creatinine, Ser: 1.23 mg/dL (ref 0.61–1.24)
GFR calc non Af Amer: >60 mL/min (ref >60)
Glucose, Bld: 181 mg/dL — ABNORMAL HIGH (ref 65–99)
Potassium: 3.9 mmol/L (ref 3.5–5.1)
SODIUM: 134 mmol/L — AB (ref 135–145)
TOTAL PROTEIN: 8.1 g/dL (ref 6.5–8.1)
Total Bilirubin: 0.6 mg/dL (ref 0.3–1.2)

## 2014-12-30 LAB — ETHANOL: ALCOHOL ETHYL (B): 291 mg/dL — AB (ref <5)

## 2014-12-30 LAB — SALICYLATE LEVEL

## 2014-12-30 LAB — ACETAMINOPHEN LEVEL

## 2014-12-30 NOTE — ED Provider Notes (Signed)
CSN: [id number]     Arrival date & time 12/30/14  [id number] History  By signing my name below, I, James Bradford, attest that this documentation has been prepared under the direction and in the presence of Derwood KaplanAnkit Madylin Fairbank, MD. Electronically Signed: Gonzella LexKimberly Bianca Bradford, Scribe. 12/30/2014. 3:25 AM.    Chief Complaint  Patient presents with  . Suicidal    The history is provided by the patient. No language interpreter was used.    HPI Comments: James Bradford is a 49 y.o. male who presents to the Emergency Department complaining of DM with no medication in three months. He reports he takes Medformin. Pt also reports he tried to kill himself but states that he has no specific plan and also states that it was just a joke. Pt reports alcohol consumption earlier today. He denies a hx of depression and schizophrenia. He has no other complaints at this time.  Past Medical History  Diagnosis Date  . Diabetes mellitus without complication (HCC)   . Hypertension    Past Surgical History  Procedure Laterality Date  . Hand surgery    . Leg surgery     History reviewed. No pertinent family history. Social History  Substance Use Topics  . Smoking status: Never Smoker   . Smokeless tobacco: None  . Alcohol Use: No    Review of Systems A complete 10 system review of systems was obtained and all systems are negative except as noted in the HPI and PMH.    Allergies  Ibuprofen and Aspirin  Home Medications   Prior to Admission medications   Medication Sig Start Date End Date Taking? Authorizing Provider  glipiZIDE (GLUCOTROL XL) 10 MG 24 hr tablet Take 1 tablet (10 mg total) by mouth daily with breakfast. 09/05/14   Eyvonne MechanicJeffrey Hedges, PA-C  HYDROcodone-acetaminophen (NORCO/VICODIN) 5-325 MG per tablet Take 2 tablets by mouth every 4 (four) hours as needed. Patient not taking: Reported on 09/05/2014 06/24/14   Elson AreasLeslie K Sofia, PA-C  lisinopril (PRINIVIL,ZESTRIL) 10 MG tablet Take 1 tablet (10 mg  total) by mouth daily. 05/19/13   Charlestine Nighthristopher Lawyer, PA-C  metFORMIN (GLUCOPHAGE) 1000 MG tablet Take 1 tablet (1,000 mg total) by mouth 2 (two) times daily with a meal. 09/05/14   Eyvonne MechanicJeffrey Hedges, PA-C  naproxen (NAPROSYN) 375 MG tablet Take 1 tablet (375 mg total) by mouth 2 (two) times daily with a meal. 07/11/14   Tilden FossaElizabeth Rees, MD  pravastatin (PRAVACHOL) 10 MG tablet Take 1 tablet (10 mg total) by mouth daily. 05/19/13   Christopher Lawyer, PA-C   BP 118/78 mmHg  Pulse 92  Temp(Src) 97.2 F (36.2 C) (Oral)  Resp 20  SpO2 100% Physical Exam  Constitutional: He is oriented to person, place, and time. He appears well-developed and well-nourished. No distress.  HENT:  Head: Normocephalic.  Eyes: Conjunctivae are normal.  Cardiovascular: Normal rate.   Pulmonary/Chest: Effort normal.  Abdominal: He exhibits no distension.  Neurological: He is alert and oriented to person, place, and time.  Skin: Skin is warm and dry.  Psychiatric: He has a normal mood and affect.  Nursing note and vitals reviewed.   ED Course  Procedures  DIAGNOSTIC STUDIES:    Oxygen Saturation is 100% on RA, normal by my interpretation.   COORDINATION OF CARE:  3:15 AM Will prescribe pt DM medication and will keep pt in the ED overnight. Discussed treatment plan with pt at bedside and pt agreed to plan.    Labs Review Labs Reviewed  COMPREHENSIVE METABOLIC PANEL - Abnormal; Notable for the following:    Sodium 134 (*)    Chloride 96 (*)    Glucose, Bld 181 (*)    All other components within normal limits  ETHANOL - Abnormal; Notable for the following:    Alcohol, Ethyl (B) 291 (*)    All other components within normal limits  ACETAMINOPHEN LEVEL - Abnormal; Notable for the following:    Acetaminophen (Tylenol), Serum <10 (*)    All other components within normal limits  SALICYLATE LEVEL  CBC    Imaging Review No results found. I have personally reviewed and evaluated these images and lab results  as part of my medical decision-making.   EKG Interpretation None      MDM   Final diagnoses:  Alcoholism (HCC)    Intoxicated pt came in with SI. He denies SI to me, but he is intoxicated. Plan is to reassess when he is sober.    Derwood Kaplan, MD 12/31/14 [phone]

## 2014-12-30 NOTE — ED Notes (Signed)
Pt denies SI at this time. Pt speaking very loudly to officer, pt then refusing to speak with Clinical research associatewriter, accusing staff of attempting to antagonize him because he is a black man. Pt states to officer he is unable to control volume due to his race. Pt then attempted to walk swiftly past officer towards TCU, pt redirected towards exit.

## 2014-12-30 NOTE — ED Notes (Signed)
Per EMS , pt. Was picked up from a parking lot inside a friend's car with complained of hyperglycemia, EMS checked sugar and cbg was 181mg /dl., pt. Was anxious and then claimed to be suicidal and mentioned pulling a gun to shot himself, pt. Reported of having PTSD. No gun found or reported with pt.  Chronic pain on left arm at 8/10. Denies SOB.

## 2014-12-30 NOTE — ED Notes (Signed)
Bed: [facility] Expected date:  Expected time:  Means of arrival:  Comments: EMS SI 

## 2014-12-30 NOTE — ED Notes (Signed)
[employer] called to bedside, per staff pt loud, cursing, threatening staff.

## 2015-01-31 ENCOUNTER — Encounter (HOSPITAL_COMMUNITY): Payer: Self-pay

## 2015-01-31 ENCOUNTER — Emergency Department (HOSPITAL_COMMUNITY): Payer: No Typology Code available for payment source

## 2015-01-31 ENCOUNTER — Emergency Department (HOSPITAL_COMMUNITY)
Admission: EM | Admit: 2015-01-31 | Discharge: 2015-01-31 | Disposition: A | Discharge status: Home / Self Care | Payer: No Typology Code available for payment source | Attending: Emergency Medicine | Admitting: Emergency Medicine

## 2015-01-31 DIAGNOSIS — Z79899 Other long term (current) drug therapy: Secondary | ICD-10-CM | POA: Insufficient documentation

## 2015-01-31 DIAGNOSIS — R109 Unspecified abdominal pain: Secondary | ICD-10-CM

## 2015-01-31 DIAGNOSIS — N179 Acute kidney failure, unspecified: Secondary | ICD-10-CM

## 2015-01-31 DIAGNOSIS — I10 Essential (primary) hypertension: Secondary | ICD-10-CM | POA: Insufficient documentation

## 2015-01-31 DIAGNOSIS — E119 Type 2 diabetes mellitus without complications: Secondary | ICD-10-CM | POA: Insufficient documentation

## 2015-01-31 LAB — COMPREHENSIVE METABOLIC PANEL
ALBUMIN: 5 g/dL (ref 3.5–5.0)
ALK PHOS: 65 U/L (ref 38–126)
ALT: 19 U/L (ref 17–63)
ANION GAP: 18 — AB (ref 5–15)
AST: 24 U/L (ref 15–41)
BUN: 11 mg/dL (ref 6–20)
CALCIUM: 10.4 mg/dL — AB (ref 8.9–10.3)
CHLORIDE: 97 mmol/L — AB (ref 101–111)
CO2: 22 mmol/L (ref 22–32)
Creatinine, Ser: 1.81 mg/dL — ABNORMAL HIGH (ref 0.61–1.24)
GFR calc Af Amer: 49 mL/min — ABNORMAL LOW (ref >60)
GFR calc non Af Amer: 43 mL/min — ABNORMAL LOW (ref >60)
Glucose, Bld: 362 mg/dL — ABNORMAL HIGH (ref 65–99)
Potassium: 3.8 mmol/L (ref 3.5–5.1)
SODIUM: 137 mmol/L (ref 135–145)
Total Bilirubin: 1 mg/dL (ref 0.3–1.2)
Total Protein: 7.9 g/dL (ref 6.5–8.1)

## 2015-01-31 LAB — URINALYSIS, ROUTINE W REFLEX MICROSCOPIC
BILIRUBIN URINE: NEGATIVE
HGB URINE DIPSTICK: NEGATIVE
Ketones, ur: 40 mg/dL — AB
Leukocytes, UA: NEGATIVE
Nitrite: NEGATIVE
PH: 5.5 (ref 5.0–8.0)
Protein, ur: 30 mg/dL — AB
SPECIFIC GRAVITY, URINE: 1.02 (ref 1.005–1.030)

## 2015-01-31 LAB — CBC
HCT: 44.2 % (ref 39.0–52.0)
HEMOGLOBIN: 15 g/dL (ref 13.0–17.0)
MCH: 31.7 pg (ref 26.0–34.0)
MCHC: 33.9 g/dL (ref 30.0–36.0)
MCV: 93.4 fL (ref 78.0–100.0)
Platelets: 291 10*3/uL (ref 150–400)
RBC: 4.73 MIL/uL (ref 4.22–5.81)
RDW: 12.6 % (ref 11.5–15.5)
WBC: 4.6 10*3/uL (ref 4.0–10.5)

## 2015-01-31 LAB — CBG MONITORING, ED: GLUCOSE-CAPILLARY: 276 mg/dL — AB (ref 65–99)

## 2015-01-31 LAB — URINE MICROSCOPIC-ADD ON

## 2015-01-31 LAB — I-STAT CG4 LACTIC ACID, ED
Lactic Acid, Venous: 2.44 mmol/L (ref 0.5–2.0)
Lactic Acid, Venous: 1.35 mmol/L (ref 0.5–2.0)

## 2015-01-31 LAB — LIPASE, BLOOD: Lipase: 36 U/L (ref 11–51)

## 2015-01-31 MED ORDER — ONDANSETRON HCL 4 MG PO TABS: 4.0000 mg | ORAL_TABLET | Freq: Four times a day (QID) | ORAL | Status: DC

## 2015-01-31 MED ORDER — MORPHINE SULFATE (PF) 4 MG/ML IV SOLN
4.0000 mg | Freq: Once | INTRAVENOUS | Status: AC
  Administered 2015-01-31: 4 mg via INTRAVENOUS
  Filled 2015-01-31: qty 1

## 2015-01-31 MED ORDER — SODIUM CHLORIDE 0.9 % IV BOLUS (SEPSIS)
1000.0000 mL | Freq: Once | INTRAVENOUS | Status: AC
  Administered 2015-01-31: 1000 mL via INTRAVENOUS

## 2015-01-31 MED ORDER — ONDANSETRON HCL 4 MG/2ML IJ SOLN
4.0000 mg | Freq: Once | INTRAMUSCULAR | Status: AC | PRN
  Administered 2015-01-31: 4 mg via INTRAVENOUS
  Filled 2015-01-31: qty 2

## 2015-01-31 MED ORDER — IOHEXOL 300 MG/ML  SOLN
80.0000 mL | Freq: Once | INTRAMUSCULAR | Status: AC | PRN
  Administered 2015-01-31: 80 mL via INTRAVENOUS

## 2015-01-31 NOTE — ED Notes (Signed)
Gave patient water patient tolerated well ask for another cup of water

## 2015-01-31 NOTE — ED Notes (Signed)
Pt returned from CT °

## 2015-01-31 NOTE — ED Provider Notes (Signed)
CSN: [id number]     Arrival date & time 01/31/15  0601 History   First MD Initiated Contact with Patient 01/31/15 [phone]     Chief Complaint  Patient presents with  . Emesis   HPI   49 YOM presents with acute onset severe abdominal pain with associated N/V. Pt reports symptoms started at  1 AM with severe sharp apin throughout abdomen with localization to RLQ. Pt reports constant vomiting/heaving. He denies any history of the same, exposure to abnormal food or drink,  Sick contacts. Pt states that he attempted drinking vinegar but this did not improve his symptoms and caused him to vomit. Pt denies any preceding illness or discomfort. He denies ETOH or drug use. Pt reports having appendectomy.   Past Medical History  Diagnosis Date  . Diabetes mellitus without complication (HCC)   . Hypertension    Past Surgical History  Procedure Laterality Date  . Hand surgery    . Leg surgery     No family history on file. Social History  Substance Use Topics  . Smoking status: Never Smoker   . Smokeless tobacco: None  . Alcohol Use: No    Review of Systems  All other systems reviewed and are negative.   Allergies  Ibuprofen and Aspirin  Home Medications   Prior to Admission medications   Medication Sig Start Date End Date Taking? Authorizing Provider  glipiZIDE (GLUCOTROL XL) 10 MG 24 hr tablet Take 1 tablet (10 mg total) by mouth daily with breakfast. 09/05/14  Yes Eyvonne MechanicJeffrey Sugar Vanzandt, PA-C  lisinopril (PRINIVIL,ZESTRIL) 10 MG tablet Take 1 tablet (10 mg total) by mouth daily. 05/19/13  Yes Charlestine Nighthristopher Lawyer, PA-C  metFORMIN (GLUCOPHAGE) 1000 MG tablet Take 1 tablet (1,000 mg total) by mouth 2 (two) times daily with a meal. 09/05/14  Yes Eyvonne MechanicJeffrey Trinia Georgi, PA-C  pravastatin (PRAVACHOL) 10 MG tablet Take 1 tablet (10 mg total) by mouth daily. 05/19/13  Yes Christopher Lawyer, PA-C  ondansetron (ZOFRAN) 4 MG tablet Take 1 tablet (4 mg total) by mouth every 6 (six) hours. 01/31/15   Raeford RazorStephen Kohut, MD    BP 122/77 mmHg  Pulse 59  Temp(Src) 97.8 F (36.6 C) (Oral)  Resp 18  Ht 5\' 8"  (1.727 m)  Wt 72.576 kg  BMI 24.33 kg/m2  SpO2 100%   Physical Exam  Constitutional: He is oriented to person, place, and time. He appears well-developed and well-nourished.  HENT:  Head: Normocephalic and atraumatic.  Eyes: Conjunctivae are normal. Pupils are equal, round, and reactive to light. Right eye exhibits no discharge. Left eye exhibits no discharge. No scleral icterus.  Neck: Normal range of motion. No JVD present. No tracheal deviation present.  Cardiovascular: Normal rate, regular rhythm, normal heart sounds and intact distal pulses.  Exam reveals no gallop and no friction rub.   No murmur heard. Pulmonary/Chest: Effort normal. No stridor.  Abdominal: Soft. There is tenderness. There is guarding. There is no rebound.  Diffuse abd tenderness localized to RLQ  Neurological: He is alert and oriented to person, place, and time. Coordination normal.  Psychiatric: He has a normal mood and affect. His behavior is normal. Judgment and thought content normal.  Nursing note and vitals reviewed.   ED Course  Procedures (including critical care time) Labs Review Labs Reviewed  COMPREHENSIVE METABOLIC PANEL - Abnormal; Notable for the following:    Chloride 97 (*)    Glucose, Bld 362 (*)    Creatinine, Ser 1.81 (*)    Calcium 10.4 (*)  GFR calc non Af Amer 43 (*)    GFR calc Af Amer 49 (*)    Anion gap 18 (*)    All other components within normal limits  URINALYSIS, ROUTINE W REFLEX MICROSCOPIC (NOT AT [facility]) - Abnormal; Notable for the following:    Glucose, UA >1000 (*)    Ketones, ur 40 (*)    Protein, ur 30 (*)    All other components within normal limits  URINE MICROSCOPIC-ADD ON - Abnormal; Notable for the following:    Squamous Epithelial / LPF 0-5 (*)    Bacteria, UA FEW (*)    Casts HYALINE CASTS (*)    All other components within normal limits  CBG MONITORING, ED - Abnormal;  Notable for the following:    Glucose-Capillary 276 (*)    All other components within normal limits  I-STAT CG4 LACTIC ACID, ED - Abnormal; Notable for the following:    Lactic Acid, Venous 2.44 (*)    All other components within normal limits  LIPASE, BLOOD  CBC  I-STAT CG4 LACTIC ACID, ED    Imaging Review Ct Abdomen Pelvis W Contrast  01/31/2015  CLINICAL DATA:  Vomiting. EXAM: CT ABDOMEN AND PELVIS WITH CONTRAST TECHNIQUE: Multidetector CT imaging of the abdomen and pelvis was performed using the standard protocol following bolus administration of intravenous contrast. CONTRAST:  80mL OMNIPAQUE IOHEXOL 300 MG/ML  SOLN COMPARISON:  None. FINDINGS: Visualized lung bases are unremarkable. No significant osseous abnormality is noted. No gallstones are noted. The liver, spleen and pancreas appear normal. Adrenal glands appear normal. Bilateral simple renal cysts are noted. No hydronephrosis or renal obstruction is noted. There is no evidence of bowel obstruction. No abnormal fluid collection is noted. Urinary bladder appears normal. The appendix is not visualized, but no inflammation is noted in the right lower quadrant. No significant adenopathy is noted. IMPRESSION: No significant abnormality seen in the abdomen or pelvis. Electronically Signed   By: Lupita Raider, M.D.   On: 01/31/2015 08:32   I have personally reviewed and evaluated these images and lab results as part of my medical decision-making.   EKG Interpretation None      MDM   Final diagnoses:  Abdominal pain  AKI (acute kidney injury) (HCC)    Labs: CBG, Lipase, CMP, lactic acid   Imaging: CT abd and pelvis-no significant findings  Consults:  Therapeutics: Morphine, normal saline, Zofran  Discharge Meds: Zofran  Assessment/Plan: 49 year old male presents today with acute onset nausea vomiting abdominal pain. Due to patient's significant abdominal pain CT abdomen and pelvis was ordered. Patient was treated with  above medications with good symptomatic improvement, CT scan showed no significant abnormalities. Patient tolerating by mouth without difficulty, no more episodes of vomiting after anti-emetics. Patient is afebrile, has reassuring vital signs. Repeat abdominal exam shows no focal abdominal pain, generalized abdominal discomfort. Patient did have a slight elevation in his kidney function, with lactic acid of 2.44. Likely due to dehydration and vomiting. Patient feeling much better requesting home management, he will be given Zofran as needed for nausea, instructed to go home and rest, return to emergency room immediately if any new or worsening signs or symptoms present.        Eyvonne Mechanic, PA-C 01/31/15 1555  Leta Baptist, MD 02/02/15 [id number]

## 2015-01-31 NOTE — ED Notes (Signed)
Pt comes from home via [facility], pt started vomiting around 1am, pt reports vomiting several times an hour with blood tinged emesis, hx of stomach ulcer.

## 2015-01-31 NOTE — Discharge Instructions (Signed)

## 2015-02-12 ENCOUNTER — Encounter (HOSPITAL_COMMUNITY): Payer: Self-pay | Admitting: *Deleted

## 2015-02-12 ENCOUNTER — Emergency Department (HOSPITAL_COMMUNITY)
Admission: EM | Admit: 2015-02-12 | Discharge: 2015-02-12 | Disposition: A | Discharge status: Home / Self Care | Payer: Worker's Compensation | Attending: Emergency Medicine | Admitting: Emergency Medicine

## 2015-02-12 DIAGNOSIS — I10 Essential (primary) hypertension: Secondary | ICD-10-CM | POA: Insufficient documentation

## 2015-02-12 DIAGNOSIS — Z79899 Other long term (current) drug therapy: Secondary | ICD-10-CM | POA: Insufficient documentation

## 2015-02-12 DIAGNOSIS — E119 Type 2 diabetes mellitus without complications: Secondary | ICD-10-CM | POA: Insufficient documentation

## 2015-02-12 DIAGNOSIS — M79622 Pain in left upper arm: Secondary | ICD-10-CM | POA: Insufficient documentation

## 2015-02-12 DIAGNOSIS — M79602 Pain in left arm: Secondary | ICD-10-CM

## 2015-02-12 DIAGNOSIS — G8929 Other chronic pain: Secondary | ICD-10-CM | POA: Insufficient documentation

## 2015-02-12 MED ORDER — OXYCODONE-ACETAMINOPHEN 5-325 MG PO TABS
2.0000 | ORAL_TABLET | Freq: Once | ORAL | Status: AC
  Administered 2015-02-12: 2 via ORAL
  Filled 2015-02-12: qty 2

## 2015-02-12 MED ORDER — OXYCODONE-ACETAMINOPHEN 5-325 MG PO TABS: 1.0000 | ORAL_TABLET | Freq: Four times a day (QID) | ORAL | Status: DC | PRN

## 2015-02-12 NOTE — ED Notes (Signed)
Pt reports hx of fracture to left arm in may, has been told he needs surgery but waiting on workers comp. Unable to get ortho appt since the pain increased on Friday.

## 2015-02-12 NOTE — Discharge Instructions (Signed)
Mr. James Bradford,  Nice meeting you! Please follow-up with your orthopedic doctor. Return to the emergency department if you develop color changes in your arm, increasing pain, shortness of breath, chest pain. Feel better soon!  S. [name], PA-C

## 2015-02-12 NOTE — ED Provider Notes (Signed)
CSN: [id number]     Arrival date & time 02/12/15  1418 History  By signing my name below, I, Phillis HaggisGabriella Gaje, attest that this documentation has been prepared under the direction and in the presence of Lane HackerNicole Esperansa Sarabia, PA-C. Electronically Signed: Phillis HaggisGabriella Gaje, ED Scribe. 02/12/2015. 3:20 PM.   Chief Complaint  Patient presents with  . Arm Pain   Patient is a 50 y.o. male presenting with arm pain. The history is provided by the patient. No language interpreter was used.  Arm Pain This is a chronic problem. The current episode started more than 1 week ago. The problem occurs constantly. The problem has been gradually worsening. He has tried nothing for the symptoms.  HPI Comments: James Bradford is a 50 y.o. Male with a hx of DM and HTN who presents to the Emergency Department complaining of gradually worsening, chronic left arm pain that radiates up to his left neck onset 3 days ago. Pt reports that he had a left arm fracture in May for which he was told he may need surgery for, but has not received it due to waiting on workers' comp. He has since had increased pain to the area starting on Friday. Pt was taking Percocet for his pain, but stopped taking it in September. Pt has not been able to follow up with orthopedics since the pain worsened. Pt [facility]. He denies fever, chills, nausea, vomiting, numbness, or weakness.   Past Medical History  Diagnosis Date  . Diabetes mellitus without complication (HCC)   . Hypertension    Past Surgical History  Procedure Laterality Date  . Hand surgery    . Leg surgery     History reviewed. No pertinent family history. Social History  Substance Use Topics  . Smoking status: Never Smoker   . Smokeless tobacco: None  . Alcohol Use: No    Review of Systems  Constitutional: Negative for fever and chills.  Gastrointestinal: Negative for nausea and vomiting.  Musculoskeletal: Positive for arthralgias.  Neurological:  Negative for weakness and numbness.  All other systems reviewed and are negative.  Allergies  Ibuprofen and Aspirin  Home Medications   Prior to Admission medications   Medication Sig Start Date End Date Taking? Authorizing Provider  glipiZIDE (GLUCOTROL XL) 10 MG 24 hr tablet Take 1 tablet (10 mg total) by mouth daily with breakfast. 09/05/14   Eyvonne MechanicJeffrey Hedges, PA-C  lisinopril (PRINIVIL,ZESTRIL) 10 MG tablet Take 1 tablet (10 mg total) by mouth daily. 05/19/13   Charlestine Nighthristopher Lawyer, PA-C  metFORMIN (GLUCOPHAGE) 1000 MG tablet Take 1 tablet (1,000 mg total) by mouth 2 (two) times daily with a meal. 09/05/14   Eyvonne MechanicJeffrey Hedges, PA-C  ondansetron (ZOFRAN) 4 MG tablet Take 1 tablet (4 mg total) by mouth every 6 (six) hours. 01/31/15   Raeford RazorStephen Kohut, MD  pravastatin (PRAVACHOL) 10 MG tablet Take 1 tablet (10 mg total) by mouth daily. 05/19/13   Christopher Lawyer, PA-C   BP 151/104 mmHg  Pulse 115  Temp(Src) 98.6 F (37 C) (Oral)  Resp 14  SpO2 98% Physical Exam  Constitutional: He appears well-developed and well-nourished. No distress.  HENT:  Head: Normocephalic and atraumatic.  Mouth/Throat: Oropharynx is clear and moist. No oropharyngeal exudate.  Eyes: Conjunctivae are normal. Pupils are equal, round, and reactive to light. Right eye exhibits no discharge. Left eye exhibits no discharge. No scleral icterus.  Neck: No tracheal deviation present.  Cardiovascular: Normal rate, regular rhythm, normal heart sounds and  intact distal pulses.  Exam reveals no gallop and no friction rub.   No murmur heard. Pulmonary/Chest: Effort normal and breath sounds normal. No respiratory distress. He has no wheezes. He has no rales. He exhibits no tenderness.  Abdominal: Soft. Bowel sounds are normal. He exhibits no distension and no mass. There is no tenderness. There is no rebound and no guarding.  Musculoskeletal: He exhibits tenderness. He exhibits no edema.  ROM limited in left arm due to pain.  Neurovascularly intact BL.   Lymphadenopathy:    He has no cervical adenopathy.  Neurological: He is alert. Coordination normal.  Skin: Skin is warm and dry. No rash noted. He is not diaphoretic. No erythema.  Psychiatric: He has a normal mood and affect. His behavior is normal.  Nursing note and vitals reviewed.   ED Course  Procedures  DIAGNOSTIC STUDIES: Oxygen Saturation is 98% on RA, normal by my interpretation.    COORDINATION OF CARE: 3:19 PM-Discussed treatment plan which includes follow up with orthopedics with pt at bedside and pt agreed to plan.    MDM  Pain managed in ED. Pt advised to follow up with orthopedics or PCP for chronic pain management. Conservative therapy recommended and discussed. Patient will be dc home & is agreeable with above plan.  Final diagnoses:  Pain of left upper extremity   I personally performed the services described in this documentation, which was scribed in my presence. The recorded information has been reviewed and is accurate.   Melton Krebs, PA-C 02/14/15 1006  Margarita Grizzle, MD 02/18/15 1235

## 2015-04-26 ENCOUNTER — Encounter (HOSPITAL_COMMUNITY): Payer: Self-pay | Admitting: Emergency Medicine

## 2015-04-26 ENCOUNTER — Emergency Department (INDEPENDENT_AMBULATORY_CARE_PROVIDER_SITE_OTHER)
Admission: EM | Admit: 2015-04-26 | Discharge: 2015-04-26 | Disposition: A | Discharge status: Home / Self Care | Payer: No Typology Code available for payment source | Source: Home / Self Care | Attending: Family Medicine | Admitting: Family Medicine

## 2015-04-26 DIAGNOSIS — E119 Type 2 diabetes mellitus without complications: Secondary | ICD-10-CM

## 2015-04-26 LAB — POCT I-STAT, CHEM 8
BUN: 37 mg/dL — AB (ref 6–20)
CHLORIDE: 99 mmol/L — AB (ref 101–111)
Calcium, Ion: 1.25 mmol/L — ABNORMAL HIGH (ref 1.12–1.23)
Creatinine, Ser: 1.2 mg/dL (ref 0.61–1.24)
Glucose, Bld: 102 mg/dL — ABNORMAL HIGH (ref 65–99)
HEMATOCRIT: 45 % (ref 39.0–52.0)
Hemoglobin: 15.3 g/dL (ref 13.0–17.0)
Potassium: 4.4 mmol/L (ref 3.5–5.1)
SODIUM: 136 mmol/L (ref 135–145)
TCO2: 24 mmol/L (ref 0–100)

## 2015-04-26 NOTE — ED Notes (Signed)
Patient is in the middle of a work related case.  Patient was to have surgery 2/27.  Patient reports sugar was 321 and he had not had medicines for 2 weeks prior to this.  Since surgery canceled, he has obtained medicines and has taken them regularly per patient.  Patient reports cbg's are regularly under 200 since taking medicines.  Patient has a form from [facility] for medical clearance for surgery.  Patient's pcp out of office, but has seen a mid-level, but did not address form with mid-level at pcp office.  Encouraged patient to return to pcp office for forms related to medical clearance to be completed.

## 2015-04-26 NOTE — ED Notes (Signed)
Patient approached nurses desk.  Patient stressing he is in a hurry and trying to get form filled out today.  Patient walked away from desk while this nurse was trying to discuss patient needs with dr Artis Flockkindl.

## 2015-04-26 NOTE — Discharge Instructions (Signed)
See your doctor as needed

## 2015-04-26 NOTE — ED Provider Notes (Signed)
CSN: [id number]     Arrival date & time 04/26/15  1309 History   First MD Initiated Contact with Patient 04/26/15 1542     Chief Complaint  Patient presents with  . Shoulder Problem   (Consider location/radiation/quality/duration/timing/severity/associated sxs/prior Treatment) Patient is a 50 y.o. male presenting with diabetes problem. The history is provided by the patient.  Diabetes This is a chronic problem. Episode onset: here at [facility] of lawyer since pt's reg md-dr dean is out of office untl june and pt's surg has been cancelled b/o elevated sugar from poor compliance. The problem has not changed since onset.Pertinent negatives include no chest pain and no abdominal pain.    Past Medical History  Diagnosis Date  . Diabetes mellitus without complication (HCC)   . Hypertension    Past Surgical History  Procedure Laterality Date  . Hand surgery    . Leg surgery     No family history on file. Social History  Substance Use Topics  . Smoking status: Never Smoker   . Smokeless tobacco: None  . Alcohol Use: No    Review of Systems  Constitutional: Negative.   Respiratory: Negative.   Cardiovascular: Negative.  Negative for chest pain.  Gastrointestinal: Negative for abdominal pain.  All other systems reviewed and are negative.   Allergies  Ibuprofen and Aspirin  Home Medications   Prior to Admission medications   Medication Sig Start Date End Date Taking? Authorizing Provider  glipiZIDE (GLUCOTROL XL) 10 MG 24 hr tablet Take 1 tablet (10 mg total) by mouth daily with breakfast. 09/05/14  Yes Eyvonne MechanicJeffrey Hedges, PA-C  lisinopril (PRINIVIL,ZESTRIL) 10 MG tablet Take 1 tablet (10 mg total) by mouth daily. 05/19/13  Yes Charlestine Nighthristopher Lawyer, PA-C  metFORMIN (GLUCOPHAGE) 1000 MG tablet Take 1 tablet (1,000 mg total) by mouth 2 (two) times daily with a meal. 09/05/14  Yes Eyvonne MechanicJeffrey Hedges, PA-C  ondansetron (ZOFRAN) 4 MG tablet Take 1 tablet (4 mg total) by mouth every 6 (six) hours.  01/31/15  Yes Raeford RazorStephen Kohut, MD  oxyCODONE-acetaminophen (PERCOCET/ROXICET) 5-325 MG tablet Take 1-2 tablets by mouth every 6 (six) hours as needed for severe pain. 02/12/15  Yes Melton KrebsSamantha Nicole Riley, PA-C  pravastatin (PRAVACHOL) 10 MG tablet Take 1 tablet (10 mg total) by mouth daily. 05/19/13  Yes Charlestine Nighthristopher Lawyer, PA-C   Meds Ordered and Administered this Visit  Medications - No data to display  BP 115/75 mmHg  Pulse 86  Temp(Src) 99.4 F (37.4 C) (Oral)  Resp 16  SpO2 98% No data found.   Physical Exam  Constitutional: He is oriented to person, place, and time. He appears well-developed and well-nourished. No distress.  Neurological: He is alert and oriented to person, place, and time.  Skin: Skin is warm and dry.  Nursing note and vitals reviewed.   ED Course  Procedures (including critical care time)  Labs Review Labs Reviewed  POCT I-STAT, CHEM 8 - Abnormal; Notable for the following:    Chloride 99 (*)    BUN 37 (*)    Glucose, Bld 102 (*)    Calcium, Ion 1.25 (*)    All other components within normal limits    Imaging Review No results found.   Visual Acuity Review  Right Eye Distance:   Left Eye Distance:   Bilateral Distance:    Right Eye Near:   Left Eye Near:    Bilateral Near:         MDM   1. Type 2 diabetes mellitus not  at goal [facility])        Linna Hoff, MD 04/26/15 [phone]

## 2015-08-18 ENCOUNTER — Emergency Department (HOSPITAL_COMMUNITY)
Admission: EM | Admit: 2015-08-18 | Discharge: 2015-08-19 | Disposition: A | Discharge status: Home / Self Care | Payer: No Typology Code available for payment source | Attending: Emergency Medicine | Admitting: Emergency Medicine

## 2015-08-18 DIAGNOSIS — Z79899 Other long term (current) drug therapy: Secondary | ICD-10-CM | POA: Insufficient documentation

## 2015-08-18 DIAGNOSIS — Z5900 Homelessness unspecified: Secondary | ICD-10-CM

## 2015-08-18 DIAGNOSIS — E119 Type 2 diabetes mellitus without complications: Secondary | ICD-10-CM | POA: Insufficient documentation

## 2015-08-18 DIAGNOSIS — Z7984 Long term (current) use of oral hypoglycemic drugs: Secondary | ICD-10-CM | POA: Insufficient documentation

## 2015-08-18 DIAGNOSIS — I10 Essential (primary) hypertension: Secondary | ICD-10-CM | POA: Insufficient documentation

## 2015-08-19 ENCOUNTER — Encounter (HOSPITAL_COMMUNITY): Payer: Self-pay | Admitting: Emergency Medicine

## 2015-08-19 NOTE — ED Notes (Signed)
Unable to perform assessment or EKG or labs a this time, pt became physically aggressive against staff, security called and at the bedside, pt is not cooperative at this time is no answering any questions or help staff understand why he came to the ED for.

## 2015-08-19 NOTE — Discharge Instructions (Signed)
Community Resource Guide Shelters °[employer]’s “211” is a great source of information about community services available.  Access by dialing 2-1-1 from anywhere in [place], or by website -  www.nc211.org.  ° °Other Local Resources (Updated 02/2015) ° °Shelters  °Services   °Phone Number and Address  °[facility] • Housing for homeless and needy men with substance abuse issues 336-228-4096 °[address] [place], [place]  °[facility] • Emergency assistance °• Shelter °• Meals °• Pantry services 336-229-0881 °[place], [place]  °[facility] • Domestic violence shelter for women and their children 336-387-6161 °[place], [place]  °[facility] • Domestic violence shelter for women and their children 336-226-5982 °[place], [place]  °[facility]   ° • The [facility] coordinates access to most shelters in [place] °• Apply in person Monday - Friday, 10 am - 4 pm.   °• After hours/ weekends, contact individual shelters directly 336-332-0824 °[address] [place], [place]  °[facility] [place] [facility]  • Housing °• Food °• Emergency financial assistance °• Permanent supportive housing 336-886-4922 °[address] [place], [place]   °[facility]  • Crisis assistance °• Medication °• Housing °• Food °• Utility assistance 336-226-4462 °[address] , [place]  °[facility]  ° • Crisis assistance °• Medication °• Housing °• Food °• Utility assistance 336-349-4923 °[address], [place], [place]  °[facility] ° ° ° • Transitional housing °• Case management °• Utility assistance °• Food °• Clothing °• Transportation assistance 336-273-5572 °[name] [place], [place]  °[facility] • Shelter for adult men and women 336-553-2665 °[address] °[place],   °24-hour Crisis Line for those Facing Homelessness     [phone]  ° °

## 2015-08-19 NOTE — ED Provider Notes (Signed)
CSN: [id number]     Arrival date & time 08/18/15  2359 History  By signing my name below, I, James Bradford, attest that this documentation has been prepared under the direction and in the presence of Tomasita Crumble, MD . Electronically Signed: Freida Bradford, Scribe. 08/19/2015. 12:53 AM.  Chief Complaint  Patient presents with  . Fatigue   LEVEL 5 CAVEAT DUE TO UNCOOPERATIVENESS   The history is provided by the patient. No language interpreter was used.   HPI Comments:  James Bradford is a 50 y.o. male with a history of DM who presents to the Emergency Department via EMS. Per EMS pt was found lying in the grass; he is homeless and has been staying with friends. Those friends reported to EMS that the pt has been vomiting, has had decreased PO intake and has not been taking his DM meds. At this time pt is refusing to answer questions.    Past Medical History  Diagnosis Date  . Diabetes mellitus without complication (HCC)   . Hypertension    Past Surgical History  Procedure Laterality Date  . Hand surgery    . Leg surgery     History reviewed. No pertinent family history. Social History  Substance Use Topics  . Smoking status: Never Smoker   . Smokeless tobacco: None  . Alcohol Use: No    Review of Systems  Reason unable to perform ROS: Uncooperativeness.      Allergies  Ibuprofen and Aspirin  Home Medications   Prior to Admission medications   Medication Sig Start Date End Date Taking? Authorizing Provider  glipiZIDE (GLUCOTROL XL) 10 MG 24 hr tablet Take 1 tablet (10 mg total) by mouth daily with breakfast. 09/05/14   Eyvonne Mechanic, PA-C  lisinopril (PRINIVIL,ZESTRIL) 10 MG tablet Take 1 tablet (10 mg total) by mouth daily. 05/19/13   Charlestine Night, PA-C  metFORMIN (GLUCOPHAGE) 1000 MG tablet Take 1 tablet (1,000 mg total) by mouth 2 (two) times daily with a meal. 09/05/14   Eyvonne Mechanic, PA-C  ondansetron (ZOFRAN) 4 MG tablet Take 1 tablet (4 mg total) by mouth every 6  (six) hours. 01/31/15   Raeford Razor, MD  oxyCODONE-acetaminophen (PERCOCET/ROXICET) 5-325 MG tablet Take 1-2 tablets by mouth every 6 (six) hours as needed for severe pain. 02/12/15   Melton Krebs, PA-C  pravastatin (PRAVACHOL) 10 MG tablet Take 1 tablet (10 mg total) by mouth daily. 05/19/13   Christopher Lawyer, PA-C   Wt 160 lb (72.576 kg)  SpO2 98% Physical Exam  Constitutional: He appears well-developed and well-nourished. No distress.  Pt refused Exam  HENT:  Head: Normocephalic and atraumatic.  Eyes: Conjunctivae are normal.  Pulmonary/Chest: Effort normal.  Abdominal: He exhibits no distension.  Neurological: He is alert.  He is easily arousable with verbal stimuli  Skin: Skin is dry. He is not diaphoretic.  Nursing note and vitals reviewed.   ED Course  Procedures   DIAGNOSTIC STUDIES:  Oxygen Saturation is 98% on RA, normal by my interpretation.     Labs Review Labs Reviewed - No data to display  Imaging Review No results found. I have personally reviewed and evaluated these images and lab results as part of my medical decision-making.   EKG Interpretation None      MDM   Final diagnoses:  Homeless    Patient is not cooperative in telling me anything that is going on.  He responds when I ask questions by saying nothing is wrong and rolling around  in bed.  He will not let me examine him.  He appears in NAD.  Patient will be DC;ed with PCP follow up advised.   I personally performed the services described in this documentation, which was scribed in my presence. The recorded information has been reviewed and is accurate.     Tomasita CrumbleAdeleke Johnnye Sandford, MD 08/19/15 [id number]
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
## 2015-08-19 NOTE — ED Notes (Signed)
Pt refuses to sing dc papers, refuses care and refuses to have Vital sing done at dc. Pt escorted to lobby by [employer].

## 2015-12-11 ENCOUNTER — Emergency Department (HOSPITAL_COMMUNITY)
Admission: EM | Admit: 2015-12-11 | Discharge: 2015-12-11 | Disposition: A | Discharge status: Home / Self Care | Payer: No Typology Code available for payment source | Attending: Emergency Medicine | Admitting: Emergency Medicine

## 2015-12-11 ENCOUNTER — Encounter (HOSPITAL_COMMUNITY): Payer: Self-pay | Admitting: Emergency Medicine

## 2015-12-11 ENCOUNTER — Emergency Department (HOSPITAL_COMMUNITY): Payer: No Typology Code available for payment source

## 2015-12-11 DIAGNOSIS — I10 Essential (primary) hypertension: Secondary | ICD-10-CM | POA: Insufficient documentation

## 2015-12-11 DIAGNOSIS — S060X0A Concussion without loss of consciousness, initial encounter: Secondary | ICD-10-CM | POA: Insufficient documentation

## 2015-12-11 DIAGNOSIS — Z7984 Long term (current) use of oral hypoglycemic drugs: Secondary | ICD-10-CM | POA: Insufficient documentation

## 2015-12-11 DIAGNOSIS — Z79899 Other long term (current) drug therapy: Secondary | ICD-10-CM | POA: Insufficient documentation

## 2015-12-11 DIAGNOSIS — Y939 Activity, unspecified: Secondary | ICD-10-CM | POA: Insufficient documentation

## 2015-12-11 DIAGNOSIS — Y999 Unspecified external cause status: Secondary | ICD-10-CM | POA: Insufficient documentation

## 2015-12-11 DIAGNOSIS — E119 Type 2 diabetes mellitus without complications: Secondary | ICD-10-CM | POA: Insufficient documentation

## 2015-12-11 DIAGNOSIS — Y929 Unspecified place or not applicable: Secondary | ICD-10-CM | POA: Insufficient documentation

## 2015-12-11 MED ORDER — ACETAMINOPHEN 325 MG PO TABS
650.0000 mg | ORAL_TABLET | Freq: Once | ORAL | Status: AC
  Administered 2015-12-11: 650 mg via ORAL
  Filled 2015-12-11: qty 2

## 2015-12-11 NOTE — Discharge Instructions (Addendum)
Continue over the counter pain medicines like Ibuprofen or Tylenol Please follow up with your family doctor
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
## 2017-06-16 ENCOUNTER — Encounter (HOSPITAL_COMMUNITY): Payer: Self-pay | Admitting: Emergency Medicine

## 2017-06-16 ENCOUNTER — Ambulatory Visit (HOSPITAL_COMMUNITY)
Admission: EM | Admit: 2017-06-16 | Discharge: 2017-06-16 | Disposition: A | Discharge status: Home / Self Care | Payer: No Typology Code available for payment source | Attending: Family Medicine | Admitting: Family Medicine

## 2017-06-16 DIAGNOSIS — I10 Essential (primary) hypertension: Secondary | ICD-10-CM

## 2017-06-16 DIAGNOSIS — E1169 Type 2 diabetes mellitus with other specified complication: Secondary | ICD-10-CM

## 2017-06-16 DIAGNOSIS — Z760 Encounter for issue of repeat prescription: Secondary | ICD-10-CM

## 2017-06-16 DIAGNOSIS — E785 Hyperlipidemia, unspecified: Secondary | ICD-10-CM

## 2017-06-16 DIAGNOSIS — R358 Other polyuria: Secondary | ICD-10-CM

## 2017-06-16 LAB — POCT I-STAT, CHEM 8
BUN: 31 mg/dL — ABNORMAL HIGH (ref 6–20)
CREATININE: 1.2 mg/dL (ref 0.61–1.24)
Calcium, Ion: 1.24 mmol/L (ref 1.15–1.40)
Chloride: 98 mmol/L — ABNORMAL LOW (ref 101–111)
Glucose, Bld: 362 mg/dL — ABNORMAL HIGH (ref 65–99)
HEMATOCRIT: 43 % (ref 39.0–52.0)
Hemoglobin: 14.6 g/dL (ref 13.0–17.0)
POTASSIUM: 4 mmol/L (ref 3.5–5.1)
Sodium: 134 mmol/L — ABNORMAL LOW (ref 135–145)
TCO2: 23 mmol/L (ref 22–32)

## 2017-06-16 LAB — POCT URINALYSIS DIP (DEVICE)
Bilirubin Urine: NEGATIVE
GLUCOSE, UA: 500 mg/dL — AB
Hgb urine dipstick: NEGATIVE
Ketones, ur: 40 mg/dL — AB
LEUKOCYTES UA: NEGATIVE
Nitrite: NEGATIVE
PROTEIN: NEGATIVE mg/dL
SPECIFIC GRAVITY, URINE: 1.01 (ref 1.005–1.030)
UROBILINOGEN UA: 0.2 mg/dL (ref 0.0–1.0)
pH: 5.5 (ref 5.0–8.0)

## 2017-06-16 MED ORDER — METFORMIN HCL ER 750 MG PO TB24: ORAL_TABLET | ORAL | 0 refills | Status: DC

## 2017-06-16 MED ORDER — GLIPIZIDE ER 10 MG PO TB24: 10.0000 mg | ORAL_TABLET | Freq: Every day | ORAL | 1 refills | Status: DC

## 2017-06-16 MED ORDER — PRAVASTATIN SODIUM 10 MG PO TABS: 10.0000 mg | ORAL_TABLET | Freq: Every day | ORAL | 1 refills | Status: DC

## 2017-06-16 MED ORDER — LISINOPRIL 10 MG PO TABS: 10.0000 mg | ORAL_TABLET | Freq: Every day | ORAL | 0 refills | Status: DC

## 2017-06-16 MED ORDER — ONDANSETRON HCL 4 MG PO TABS: 4.0000 mg | ORAL_TABLET | Freq: Four times a day (QID) | ORAL | 0 refills | Status: DC

## 2017-06-16 NOTE — ED Provider Notes (Signed)
[facility]    CSN: [id number] Arrival date & time: 06/16/17  1510     History   Chief Complaint Chief Complaint  Patient presents with  . Medication Refill    HPI James Bradford is a 52 y.o. male.   HPI  James Bradford is a pleasant 52 year old gentleman who is recently being incarcerated.  He is out of all his medications.  He has diabetes, hypertension, and hyperlipidemia.  He states that he has noticed some increased thirst and polyuria.  No headaches, no dizzy spell.  No chest pain, no shortness of breath.  No known cardiac disease.  He states is a known smoker.  He states that he is trying to eat well.  His weight is stable.  He wishes to go back on his medications.  He requires assistance in getting a new PCP.  Past Medical History:  Diagnosis Date  . Diabetes mellitus without complication (HCC)   . Hypertension     Patient Active Problem List   Diagnosis Date Noted  . HYPERCHOLESTEROLEMIA 02/26/2009  . ERECTILE DYSFUNCTION 04/27/2008  . GLAUCOMA 02/18/2008  . HYPERTENSION, BENIGN ESSENTIAL 02/16/2008  . DIABETES MELLITUS, TYPE II 02/03/2005    Past Surgical History:  Procedure Laterality Date  . HAND SURGERY    . LEG SURGERY         Home Medications    Prior to Admission medications   Medication Sig Start Date End Date Taking? Authorizing Provider  glipiZIDE (GLUCOTROL XL) 10 MG 24 hr tablet Take 1 tablet (10 mg total) by mouth daily with breakfast. 06/16/17   Eustace Moore, MD  lisinopril (PRINIVIL,ZESTRIL) 10 MG tablet Take 1 tablet (10 mg total) by mouth daily. 06/16/17   Eustace Moore, MD  metFORMIN (GLUCOPHAGE-XR) 750 MG 24 hr tablet I tablet BID with food 06/16/17   Eustace Moore, MD  ondansetron (ZOFRAN) 4 MG tablet Take 1 tablet (4 mg total) by mouth every 6 (six) hours. 06/16/17   Eustace Moore, MD  pravastatin (PRAVACHOL) 10 MG tablet Take 1 tablet (10 mg total) by mouth daily. 06/16/17   Eustace Moore, MD    Family  History History reviewed. No pertinent family history.  Social History Social History   Tobacco Use  . Smoking status: Never Smoker  . Smokeless tobacco: Never Used  Substance Use Topics  . Alcohol use: No  . Drug use: No     Allergies   Ibuprofen and Aspirin   Review of Systems Review of Systems  Constitutional: Negative for activity change, appetite change and unexpected weight change.  HENT: Negative for congestion and dental problem.   Eyes: Negative for pain and visual disturbance.  Respiratory: Negative for cough and shortness of breath.   Cardiovascular: Negative for chest pain and palpitations.  Gastrointestinal: Negative for abdominal pain, nausea and vomiting.  Endocrine: Positive for polydipsia and polyuria.  Genitourinary: Negative for dysuria and hematuria.  Musculoskeletal: Negative for arthralgias and back pain.  Skin: Negative for color change and rash.  Neurological: Negative for seizures and syncope.  Psychiatric/Behavioral: Negative for behavioral problems.  All other systems reviewed and are negative.    Physical Exam Triage Vital Signs ED Triage Vitals  Enc Vitals Group     BP 06/16/17 1523 (!) 159/92     Pulse Rate 06/16/17 1523 97     Resp 06/16/17 1523 18     Temp 06/16/17 1523 98.6 F (37 C)     Temp Source 06/16/17 1523  Oral     SpO2 06/16/17 1523 97 %     Weight --      Height --      Head Circumference --      Peak Flow --      Pain Score 06/16/17 1654 0     Pain Loc --      Pain Edu? --      Excl. in GC? --    No data found.  Updated Vital Signs BP (!) 159/92 (BP Location: Left Arm)   Pulse 97   Temp 98.6 F (37 C) (Oral)   Resp 18   SpO2 97%   :     Physical Exam  Constitutional: He appears well-developed and well-nourished.  HENT:  Head: Normocephalic and atraumatic.  Mouth slightly dry  Eyes: Conjunctivae are normal.  Neck: Neck supple. No thyromegaly present.  Cardiovascular: Normal rate and regular rhythm.    No murmur heard. Pulmonary/Chest: Effort normal and breath sounds normal. No respiratory distress.  Abdominal: Soft. There is no tenderness.  No organomegaly  Musculoskeletal: Normal range of motion. He exhibits no edema.  Lymphadenopathy:    He has no cervical adenopathy.  Neurological: He is alert.  Skin: Skin is warm and dry.  Psychiatric: He has a normal mood and affect.  Nursing note and vitals reviewed.    UC Treatments / Results  Labs (all labs ordered are listed, but only abnormal results are displayed) Labs Reviewed  POCT URINALYSIS DIP (DEVICE) - Abnormal; Notable for the following components:      Result Value   Glucose, UA 500 (*)    Ketones, ur 40 (*)    All other components within normal limits  POCT I-STAT, CHEM 8 - Abnormal; Notable for the following components:   Sodium 134 (*)    Chloride 98 (*)    BUN 31 (*)    Glucose, Bld 362 (*)    All other components within normal limits    EKG None  Radiology No results found.  Procedures Procedures (including critical care time)  Medications Ordered in UC Medications - No data to display  Initial Impression / Assessment and Plan / UC Course  I have reviewed the triage vital signs and the nursing notes.  Pertinent labs & imaging results that were available during my care of the patient were reviewed by me and considered in my medical decision making (see chart for details).     Discussed the importance of getting back on his medicines.  Discussed diabetic diet.  Limit carbohydrates and sweets.  Needs exercise daily.  Referred him for PCP to try to get assistance in finding a new primary care doctor.  Medicines are Refilled.  I did switch him from Glucophage to gavage Exar because he states he medicine does cause him some nausea and GI distress. Final Clinical Impressions(s) / UC Diagnoses   Final diagnoses:  Medication refill  Type 2 diabetes mellitus with other specified complication, without  long-term current use of insulin (HCC)  Essential hypertension  Hyperlipidemia, unspecified hyperlipidemia type     Discharge Instructions     Take the metformin XR 2 x a day Take the other medicines once a day Stay on diabetic diet See PCP as soon as you are able   ED Prescriptions    Medication Sig Dispense Auth. Provider   glipiZIDE (GLUCOTROL XL) 10 MG 24 hr tablet Take 1 tablet (10 mg total) by mouth daily with breakfast. 30 tablet Eustace Moore,  MD   lisinopril (PRINIVIL,ZESTRIL) 10 MG tablet Take 1 tablet (10 mg total) by mouth daily. 30 tablet Eustace Moore, MD   ondansetron (ZOFRAN) 4 MG tablet Take 1 tablet (4 mg total) by mouth every 6 (six) hours. 20 tablet Eustace Moore, MD   pravastatin (PRAVACHOL) 10 MG tablet Take 1 tablet (10 mg total) by mouth daily. 30 tablet Eustace Moore, MD   metFORMIN (GLUCOPHAGE-XR) 750 MG 24 hr tablet I tablet BID with food 60 tablet Eustace Moore, MD     Controlled Substance Prescriptions [place] Controlled Substance Registry consulted? Not Applicable  Eustace Moore, MD 06/16/17 2139

## 2017-06-16 NOTE — ED Triage Notes (Signed)
Pt here for refill on diabetes medication and htn meds

## 2017-06-16 NOTE — Discharge Instructions (Signed)
Take the metformin XR 2 x a day Take the other medicines once a day Stay on diabetic diet See PCP as soon as you are able

## 2019-02-20 ENCOUNTER — Emergency Department (HOSPITAL_COMMUNITY)
Admission: EM | Admit: 2019-02-20 | Discharge: 2019-02-20 | Disposition: A | Discharge status: Home / Self Care | Payer: Self-pay | Attending: Emergency Medicine | Admitting: Emergency Medicine

## 2019-02-20 ENCOUNTER — Encounter (HOSPITAL_COMMUNITY): Payer: Self-pay | Admitting: Emergency Medicine

## 2019-02-20 ENCOUNTER — Other Ambulatory Visit: Payer: Self-pay

## 2019-02-20 DIAGNOSIS — I10 Essential (primary) hypertension: Secondary | ICD-10-CM | POA: Insufficient documentation

## 2019-02-20 DIAGNOSIS — R1084 Generalized abdominal pain: Secondary | ICD-10-CM | POA: Insufficient documentation

## 2019-02-20 DIAGNOSIS — R739 Hyperglycemia, unspecified: Secondary | ICD-10-CM

## 2019-02-20 DIAGNOSIS — Z79899 Other long term (current) drug therapy: Secondary | ICD-10-CM | POA: Insufficient documentation

## 2019-02-20 DIAGNOSIS — E1165 Type 2 diabetes mellitus with hyperglycemia: Secondary | ICD-10-CM | POA: Insufficient documentation

## 2019-02-20 DIAGNOSIS — Z7984 Long term (current) use of oral hypoglycemic drugs: Secondary | ICD-10-CM | POA: Insufficient documentation

## 2019-02-20 LAB — CBG MONITORING, ED
Glucose-Capillary: 434 mg/dL — ABNORMAL HIGH (ref 70–99)
Glucose-Capillary: 432 mg/dL — ABNORMAL HIGH (ref 70–99)

## 2019-02-20 LAB — COMPREHENSIVE METABOLIC PANEL
ALT: 27 U/L (ref 0–44)
AST: 23 U/L (ref 15–41)
Albumin: 4.6 g/dL (ref 3.5–5.0)
Alkaline Phosphatase: 105 U/L (ref 38–126)
Anion gap: 14 (ref 5–15)
BUN: 24 mg/dL — ABNORMAL HIGH (ref 6–20)
CO2: 23 mmol/L (ref 22–32)
Calcium: 9.5 mg/dL (ref 8.9–10.3)
Chloride: 95 mmol/L — ABNORMAL LOW (ref 98–111)
Creatinine, Ser: 1.79 mg/dL — ABNORMAL HIGH (ref 0.61–1.24)
GFR calc Af Amer: 49 mL/min — ABNORMAL LOW (ref >60)
GFR calc non Af Amer: 43 mL/min — ABNORMAL LOW (ref >60)
Glucose, Bld: 450 mg/dL — ABNORMAL HIGH (ref 70–99)
Potassium: 4 mmol/L (ref 3.5–5.1)
Sodium: 132 mmol/L — ABNORMAL LOW (ref 135–145)
Total Bilirubin: 1 mg/dL (ref 0.3–1.2)
Total Protein: 8 g/dL (ref 6.5–8.1)

## 2019-02-20 LAB — CBC
HCT: 40.9 % (ref 39.0–52.0)
Hemoglobin: 13.8 g/dL (ref 13.0–17.0)
MCH: 31.2 pg (ref 26.0–34.0)
MCHC: 33.7 g/dL (ref 30.0–36.0)
MCV: 92.3 fL (ref 80.0–100.0)
Platelets: 293 10*3/uL (ref 150–400)
RBC: 4.43 MIL/uL (ref 4.22–5.81)
RDW: 11.9 % (ref 11.5–15.5)
WBC: 3.6 10*3/uL — ABNORMAL LOW (ref 4.0–10.5)
nRBC: 0 % (ref 0.0–0.2)

## 2019-02-20 LAB — URINALYSIS, ROUTINE W REFLEX MICROSCOPIC
Bacteria, UA: NONE SEEN
Bilirubin Urine: NEGATIVE
Glucose, UA: >=500 mg/dL — AB
Hgb urine dipstick: NEGATIVE
Ketones, ur: 20 mg/dL — AB
Leukocytes,Ua: NEGATIVE
Nitrite: NEGATIVE
Protein, ur: 30 mg/dL — AB
Specific Gravity, Urine: 1.028 (ref 1.005–1.030)
pH: 5 (ref 5.0–8.0)

## 2019-02-20 LAB — LIPASE, BLOOD: Lipase: 49 U/L (ref 11–51)

## 2019-02-20 MED ORDER — SODIUM CHLORIDE 0.9% FLUSH
3.0000 mL | Freq: Once | INTRAVENOUS | Status: AC
  Administered 2019-02-20: 3 mL via INTRAVENOUS

## 2019-02-20 MED ORDER — METFORMIN HCL ER 750 MG PO TB24: ORAL_TABLET | ORAL | 0 refills | Status: DC

## 2019-02-20 MED ORDER — LISINOPRIL 10 MG PO TABS
10.0000 mg | ORAL_TABLET | Freq: Once | ORAL | Status: AC
  Administered 2019-02-20: 10 mg via ORAL
  Filled 2019-02-20: qty 1

## 2019-02-20 MED ORDER — SODIUM CHLORIDE 0.9 % IV BOLUS
1000.0000 mL | Freq: Once | INTRAVENOUS | Status: AC
  Administered 2019-02-20: 1000 mL via INTRAVENOUS

## 2019-02-20 MED ORDER — ONDANSETRON HCL 4 MG PO TABS: 4.0000 mg | ORAL_TABLET | Freq: Four times a day (QID) | ORAL | 0 refills | Status: DC

## 2019-02-20 MED ORDER — PRAVASTATIN SODIUM 10 MG PO TABS: 10.0000 mg | ORAL_TABLET | Freq: Every day | ORAL | 1 refills | Status: DC

## 2019-02-20 MED ORDER — LISINOPRIL 10 MG PO TABS: 10.0000 mg | ORAL_TABLET | Freq: Every day | ORAL | 0 refills | Status: DC

## 2019-02-20 MED ORDER — GLIPIZIDE ER 10 MG PO TB24: 10.0000 mg | ORAL_TABLET | Freq: Every day | ORAL | 1 refills | Status: DC

## 2019-02-20 MED ORDER — GLIPIZIDE ER 10 MG PO TB24
10.0000 mg | ORAL_TABLET | Freq: Every day | ORAL | Status: DC
  Administered 2019-02-20: 10 mg via ORAL
  Filled 2019-02-20: qty 1

## 2019-02-20 MED ORDER — METFORMIN HCL 500 MG PO TABS
500.0000 mg | ORAL_TABLET | Freq: Once | ORAL | Status: AC
  Administered 2019-02-20: 500 mg via ORAL
  Filled 2019-02-20: qty 1

## 2019-02-20 NOTE — ED Notes (Signed)
Pt given water for fluid challenge 

## 2019-02-20 NOTE — ED Provider Notes (Signed)
[facility] Provider Note   CSN: [id number] Arrival date & time: 02/20/19  1123     History Chief Complaint  Patient presents with  . Abdominal Pain    James Bradford is a 54 y.o. male.  HPI  Patient is a 54 year old male with a history of DM 2, hypertension, HLD was recently incarcerated for 30 months and was receiving all of his normal medications which include lisinopril, pravastatin, metformin, glipizide but when he was released November 30 he did not have any prescriptions for his medications and has been unable to make an appointment to follow-up with primary care.  Patient presents today for generalized abdominal pain that has been ongoing for approximately last week he states that he has been urinating more frequently and drinking more water over the past 2 weeks denies any nausea or vomiting, changes in bowel movements, fevers chills headache lightheadedness or dizziness.  Denies any chest pain or shortness of breath.  Denies any anorexia.  He has no history of abdominal surgeries.  No history of diverticulitis  On my review of the EMR patient's last CT abdomen pelvis was in late 2016 and showed no abnormalities other than bilateral simple renal cysts no evidence of diverticulosis.     Past Medical History:  Diagnosis Date  . Diabetes mellitus without complication ([place])   . Hypertension     Patient Active Problem List   Diagnosis Date Noted  . HYPERCHOLESTEROLEMIA 02/26/2009  . ERECTILE DYSFUNCTION 04/27/2008  . GLAUCOMA 02/18/2008  . HYPERTENSION, BENIGN ESSENTIAL 02/16/2008  . DIABETES MELLITUS, TYPE II 02/03/2005    Past Surgical History:  Procedure Laterality Date  . HAND SURGERY    . LEG SURGERY         No family history on file.  Social History   Tobacco Use  . Smoking status: Never Smoker  . Smokeless tobacco: Never Used  Substance Use Topics  . Alcohol use: No  . Drug use: No    Home Medications Prior  to Admission medications   Medication Sig Start Date End Date Taking? Authorizing Provider  glipiZIDE (GLUCOTROL XL) 10 MG 24 hr tablet Take 1 tablet (10 mg total) by mouth daily with breakfast. 02/20/19   Daun Rens, Kathleene Hazel, PA  lisinopril (ZESTRIL) 10 MG tablet Take 1 tablet (10 mg total) by mouth daily. 02/20/19   Tedd Sias, PA  metFORMIN (GLUCOPHAGE-XR) 750 MG 24 hr tablet I tablet BID with food 02/20/19   Daksha Koone S, PA  ondansetron (ZOFRAN) 4 MG tablet Take 1 tablet (4 mg total) by mouth every 6 (six) hours. 02/20/19   Tedd Sias, PA  pravastatin (PRAVACHOL) 10 MG tablet Take 1 tablet (10 mg total) by mouth daily. 02/20/19   Tedd Sias, PA    Allergies    Ibuprofen and Aspirin  Review of Systems   Review of Systems  Constitutional: Negative for chills and fever.  HENT: Negative for congestion.   Eyes: Negative for pain.  Respiratory: Negative for cough and shortness of breath.   Cardiovascular: Negative for chest pain and leg swelling.  Gastrointestinal: Positive for abdominal pain. Negative for blood in stool, constipation, diarrhea, nausea and vomiting.  Genitourinary: Positive for frequency. Negative for dysuria.  Musculoskeletal: Negative for myalgias.  Skin: Negative for rash.  Neurological: Negative for dizziness and headaches.    Physical Exam Updated Vital Signs BP (!) 142/100 (BP Location: Left Arm)   Pulse 85   Temp 97.8 F (36.6 C) (  Oral)   Resp 18   SpO2 100%   Physical Exam Vitals and nursing note reviewed.  Constitutional:      General: He is not in acute distress.    Comments: Well-appearing 54 year old male appears stated age in no acute distress or discomfort, ambulatory, speaking full sentences no acute distress  HENT:     Head: Normocephalic and atraumatic.     Nose: Nose normal.     Mouth/Throat:     Mouth: Mucous membranes are dry.  Eyes:     General: No scleral icterus.    Conjunctiva/sclera: Conjunctivae normal.    Cardiovascular:     Rate and Rhythm: Normal rate and regular rhythm.     Pulses: Normal pulses.     Heart sounds: Normal heart sounds.     Comments: HR 90 on my exam Pulmonary:     Effort: Pulmonary effort is normal. No respiratory distress.     Breath sounds: No wheezing.  Abdominal:     Palpations: Abdomen is soft.     Tenderness: There is abdominal tenderness (mild). There is no right CVA tenderness, left CVA tenderness, guarding or rebound.     Comments: Diffuse mild tenderness to palpation, some mild right lower quadrant tenderness with no guarding or rebound, negative Rovsing, psoas, obturator  Musculoskeletal:     Cervical back: Normal range of motion.     Right lower leg: No edema.     Left lower leg: No edema.  Skin:    General: Skin is warm and dry.     Capillary Refill: Capillary refill takes less than 2 seconds.  Neurological:     General: No focal deficit present.     Mental Status: He is alert and oriented to person, place, and time. Mental status is at baseline.     Comments: Alert and oriented answers questions appropriate and follow commands  Psychiatric:        Mood and Affect: Mood normal.        Behavior: Behavior normal.     ED Results / Procedures / Treatments   Labs (all labs ordered are listed, but only abnormal results are displayed) Labs Reviewed  COMPREHENSIVE METABOLIC PANEL - Abnormal; Notable for the following components:      Result Value   Sodium 132 (*)    Chloride 95 (*)    Glucose, Bld 450 (*)    BUN 24 (*)    Creatinine, Ser 1.79 (*)    GFR calc non Af Amer 43 (*)    GFR calc Af Amer 49 (*)    All other components within normal limits  CBC - Abnormal; Notable for the following components:   WBC 3.6 (*)    All other components within normal limits  URINALYSIS, ROUTINE W REFLEX MICROSCOPIC - Abnormal; Notable for the following components:   Color, Urine STRAW (*)    Glucose, UA >=500 (*)    Ketones, ur 20 (*)    Protein, ur 30 (*)     All other components within normal limits  CBG MONITORING, ED - Abnormal; Notable for the following components:   Glucose-Capillary 434 (*)    All other components within normal limits  LIPASE, BLOOD    EKG None  Radiology No results found.  Procedures Procedures (including critical care time)  Medications Ordered in ED Medications  glipiZIDE (GLUCOTROL XL) 24 hr tablet 10 mg (has no administration in time range)  lisinopril (ZESTRIL) tablet 10 mg (has no administration in time range)  metFORMIN (GLUCOPHAGE) tablet 500 mg (has no administration in time range)  sodium chloride flush (NS) 0.9 % injection 3 mL (3 mLs Intravenous Given 02/20/19 1319)  sodium chloride 0.9 % bolus 1,000 mL (1,000 mLs Intravenous New Bag/Given 02/20/19 1319)  sodium chloride 0.9 % bolus 1,000 mL (1,000 mLs Intravenous New Bag/Given 02/20/19 1410)    ED Course  I have reviewed the triage vital signs and the nursing notes.  Pertinent labs & imaging results that were available during my care of the patient were reviewed by me and considered in my medical decision making (see chart for details).  Patient is 54 year old male with history of diabetes, hyperlipidemia, hypertension presented today with generalized abdominal pain has not taken any of his medications since November 30 medications described in HPI.  He is well-appearing in no acute distress is generalized abdominal pain but is mildly tender in the right lower quadrant but with no other PE findings consistent with appendicitis.  He is eating and drinking well in fact he is drinking water significantly frequently likely because of his elevated blood sugar.  He is requesting refills on his medications today.  He is attempting to follow-up with primary care doctor and has made efforts to do so has not been able to see them yet.  Doubt appendicitis or diverticulitis as cause of patient's symptoms today.  He has no testicular pain to indicate testicular  torsion.  He does have elevated blood pressure but pulses bilateral upper and lower extremities are equal and symmetric.  Doubt AAA.  Mesenteric ischemia as pain does not seem to be temporally connected to eating for similar reasons doubt cholecystitis, biliary colic, other biliary disease.  Doubt pancreatitis as he has no epigastric pain lipase is within normal limits and no history of pancreatitis.  No urinary symptoms other than frequency which is consistent with his elevated blood sugar.  Normal bowel movements doubt SBO/LBO.  No history of kidney stones no hemoglobin or RBC on urinalysis indicate this.  Suspect patient's diffuse abdominal pain is due to elevated blood sugar.  Will give 2 L normal saline and reassess CBG and vitals.  Clinical Course as of Feb 19 1618  Wynelle Link Feb 20, 2019  1348 No evidence of infection.  Glucose in urine consistent with expected glucosuria elevated blood sugar diabetic status.  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  Urinalysis, Routine w reflex microscopic(!) [WF]  1349 CMP with markedly elevated glucose of 450.  BUN and creatinine are elevated mildly compared to 1 year ago.  No significant electrolyte abnormalities.  Comprehensive metabolic panel(!) [WF]  1349 No leukocytosis or anemia  CBC(!) [WF]  1550 Discussed case with Dr. Lynelle Doctor who recommends patient home dose of his home medications and discharge with follow-up.   [WF]    Clinical Course User Index [WF] Gailen Shelter, [place]   I discussed this case with my attending physician Dr. Lynelle Doctor who cosigned this note including patient's presenting symptoms, physical exam, and planned diagnostics and interventions. Attending physician stated agreement with plan or made changes to plan which were implemented.   Administered patients home dose of glipizide, lisinopril and Metformin 500 mg  Patient given 1 month prescription for the above medications.  He will  follow-up with transition of care team who is arranging for follow-up appointment with PCP.  Patient does not have a cell phone so his son cell phone number was given to transitional care team which is  [phone].  Repeat abdominal exam: Abdominal pain is completely resolved.  On my palpation he has no right lower quadrant tenderness no guarding abdomen is soft no rebound negative McBurney Rovsing and murphy's.  The medical records were personally reviewed by myself. I personally reviewed all lab results and interpreted all imaging studies and either concurred with their official read or contacted radiology for clarification.   This patient appears reasonably screened and I doubt any other medical condition requiring further workup, evaluation, or treatment in the ED at this time prior to discharge.   Patient's vitals are WNL apart from vital sign abnormalities discussed above, patient is in NAD, and able to ambulate in the ED at their baseline and able to tolerate PO.  Pain has been managed or a plan has been made for home management and has no complaints prior to discharge. Patient is comfortable with above plan and for discharge at this time. All questions were answered prior to disposition. Results from the ER workup discussed with the patient face to face and all questions answered to the best of my ability. The patient is safe for discharge with strict return precautions. Patient appears safe for discharge with appropriate follow-up. Conveyed my impression with the patient and they voiced understanding and are agreeable to plan.   An After Visit Summary was printed and given to the patient.  Portions of this note were generated with Scientist, clinical (histocompatibility and immunogenetics). Dictation errors may occur despite best attempts at proofreading.    MDM Rules/Calculators/A&P                      Final Clinical Impression(s) / ED Diagnoses Final diagnoses:  Generalized abdominal pain  Hyperglycemia    Rx /  DC Orders ED Discharge Orders         Ordered    glipiZIDE (GLUCOTROL XL) 10 MG 24 hr tablet  Daily with breakfast     02/20/19 1444    lisinopril (ZESTRIL) 10 MG tablet  Daily     02/20/19 1444    metFORMIN (GLUCOPHAGE-XR) 750 MG 24 hr tablet     02/20/19 1444    ondansetron (ZOFRAN) 4 MG tablet  Every 6 hours     02/20/19 1444    pravastatin (PRAVACHOL) 10 MG tablet  Daily     02/20/19 1444           Solon Augusta Calvin, [place] 02/20/19 1622    Linwood Dibbles, MD 02/21/19 1709

## 2019-02-20 NOTE — Discharge Instructions (Addendum)
I have refilled your Metformin, lisinopril, glipizide and pravastatin.  Please take as prescribed.  I have also written a prescription for Zofran which is a nausea medication which I suspect is medication you were given the past to help you keep down the pravastatin.  Please follow-up with the care team who should be reaching out to you via your son's cell phone.  Please follow-up with primary care doctor as soon as possible.  Please return to ED if you have any new or concerning symptoms.

## 2019-02-20 NOTE — ED Triage Notes (Signed)
C/o generalized abd pain x 1 week.  Denies nausea, vomiting, and diarrhea.  Pt states he was released from jail on 11/30 and hasn't had any diabetes meds to take since being released.

## 2019-02-21 ENCOUNTER — Telehealth: Payer: Self-pay

## 2019-02-21 ENCOUNTER — Telehealth: Payer: Self-pay | Admitting: *Deleted

## 2019-02-21 NOTE — Telephone Encounter (Signed)
Takirah Binford J. Lucretia Roers, RN, BSN, [place] [phone]  [facility] set up appointment with [facility] on 1/27 @2 :10.  Spoke with pt son and advised to please arrive 15 min early and take a picture ID and your current medications.  Pt verbalizes understanding of keeping appointment.

## 2019-02-21 NOTE — Telephone Encounter (Signed)
Message received from Oletta Cohn, RN CM requesting a hospital follow up appointment for the patient.  Informed her that an appt has been scheduled for 03/02/2019 @ 1410 @ [facility].  She said that she would notify the patient.

## 2019-03-02 ENCOUNTER — Ambulatory Visit (INDEPENDENT_AMBULATORY_CARE_PROVIDER_SITE_OTHER): Payer: Self-pay | Admitting: Primary Care

## 2019-04-23 ENCOUNTER — Encounter (HOSPITAL_COMMUNITY): Payer: Self-pay | Admitting: Emergency Medicine

## 2019-04-23 ENCOUNTER — Other Ambulatory Visit: Payer: Self-pay

## 2019-04-23 ENCOUNTER — Inpatient Hospital Stay (HOSPITAL_COMMUNITY)
Admission: EM | Admit: 2019-04-23 | Discharge: 2019-04-24 | DRG: 638 | Disposition: A | Discharge status: Home / Self Care | Payer: Self-pay | Attending: Family Medicine | Admitting: Family Medicine

## 2019-04-23 DIAGNOSIS — N179 Acute kidney failure, unspecified: Secondary | ICD-10-CM | POA: Diagnosis present

## 2019-04-23 DIAGNOSIS — Z79899 Other long term (current) drug therapy: Secondary | ICD-10-CM

## 2019-04-23 DIAGNOSIS — E871 Hypo-osmolality and hyponatremia: Secondary | ICD-10-CM | POA: Diagnosis present

## 2019-04-23 DIAGNOSIS — E785 Hyperlipidemia, unspecified: Secondary | ICD-10-CM | POA: Diagnosis present

## 2019-04-23 DIAGNOSIS — Z66 Do not resuscitate: Secondary | ICD-10-CM | POA: Diagnosis present

## 2019-04-23 DIAGNOSIS — Z821 Family history of blindness and visual loss: Secondary | ICD-10-CM

## 2019-04-23 DIAGNOSIS — Z7984 Long term (current) use of oral hypoglycemic drugs: Secondary | ICD-10-CM

## 2019-04-23 DIAGNOSIS — E1165 Type 2 diabetes mellitus with hyperglycemia: Secondary | ICD-10-CM | POA: Diagnosis present

## 2019-04-23 DIAGNOSIS — E7800 Pure hypercholesterolemia, unspecified: Secondary | ICD-10-CM | POA: Diagnosis present

## 2019-04-23 DIAGNOSIS — E1122 Type 2 diabetes mellitus with diabetic chronic kidney disease: Secondary | ICD-10-CM | POA: Diagnosis present

## 2019-04-23 DIAGNOSIS — Z833 Family history of diabetes mellitus: Secondary | ICD-10-CM

## 2019-04-23 DIAGNOSIS — Z9114 Patient's other noncompliance with medication regimen: Secondary | ICD-10-CM

## 2019-04-23 DIAGNOSIS — E875 Hyperkalemia: Secondary | ICD-10-CM | POA: Diagnosis present

## 2019-04-23 DIAGNOSIS — I10 Essential (primary) hypertension: Secondary | ICD-10-CM

## 2019-04-23 DIAGNOSIS — Z20822 Contact with and (suspected) exposure to covid-19: Secondary | ICD-10-CM | POA: Diagnosis present

## 2019-04-23 DIAGNOSIS — N189 Chronic kidney disease, unspecified: Secondary | ICD-10-CM | POA: Diagnosis present

## 2019-04-23 DIAGNOSIS — Z886 Allergy status to analgesic agent status: Secondary | ICD-10-CM

## 2019-04-23 DIAGNOSIS — N183 Chronic kidney disease, stage 3 unspecified: Secondary | ICD-10-CM

## 2019-04-23 DIAGNOSIS — H409 Unspecified glaucoma: Secondary | ICD-10-CM | POA: Diagnosis present

## 2019-04-23 DIAGNOSIS — N529 Male erectile dysfunction, unspecified: Secondary | ICD-10-CM | POA: Diagnosis present

## 2019-04-23 DIAGNOSIS — E1100 Type 2 diabetes mellitus with hyperosmolarity without nonketotic hyperglycemic-hyperosmolar coma (NKHHC): Principal | ICD-10-CM | POA: Diagnosis present

## 2019-04-23 DIAGNOSIS — E1159 Type 2 diabetes mellitus with other circulatory complications: Secondary | ICD-10-CM

## 2019-04-23 DIAGNOSIS — E1169 Type 2 diabetes mellitus with other specified complication: Secondary | ICD-10-CM

## 2019-04-23 DIAGNOSIS — R1030 Lower abdominal pain, unspecified: Secondary | ICD-10-CM | POA: Diagnosis present

## 2019-04-23 DIAGNOSIS — I129 Hypertensive chronic kidney disease with stage 1 through stage 4 chronic kidney disease, or unspecified chronic kidney disease: Secondary | ICD-10-CM | POA: Diagnosis present

## 2019-04-23 LAB — HIV ANTIBODY (ROUTINE TESTING W REFLEX): HIV Screen 4th Generation wRfx: NONREACTIVE

## 2019-04-23 LAB — CBC WITH DIFFERENTIAL/PLATELET
Abs Immature Granulocytes: 0.01 10*3/uL (ref 0.00–0.07)
Basophils Absolute: 0 10*3/uL (ref 0.0–0.1)
Basophils Relative: 1 %
Eosinophils Absolute: 0 10*3/uL (ref 0.0–0.5)
Eosinophils Relative: 1 %
HCT: 37.7 % — ABNORMAL LOW (ref 39.0–52.0)
Hemoglobin: 12.4 g/dL — ABNORMAL LOW (ref 13.0–17.0)
Immature Granulocytes: 0 %
Lymphocytes Relative: 23 %
Lymphs Abs: 1 10*3/uL (ref 0.7–4.0)
MCH: 31.2 pg (ref 26.0–34.0)
MCHC: 32.9 g/dL (ref 30.0–36.0)
MCV: 94.7 fL (ref 80.0–100.0)
Monocytes Absolute: 0.5 10*3/uL (ref 0.1–1.0)
Monocytes Relative: 12 %
Neutro Abs: 2.8 10*3/uL (ref 1.7–7.7)
Neutrophils Relative %: 63 %
Platelets: 265 10*3/uL (ref 150–400)
RBC: 3.98 MIL/uL — ABNORMAL LOW (ref 4.22–5.81)
RDW: 12.6 % (ref 11.5–15.5)
WBC: 4.3 10*3/uL (ref 4.0–10.5)
nRBC: 0 % (ref 0.0–0.2)

## 2019-04-23 LAB — POCT I-STAT EG7
Bicarbonate: 25.9 mmol/L (ref 20.0–28.0)
Calcium, Ion: 1.17 mmol/L (ref 1.15–1.40)
HCT: 37 % — ABNORMAL LOW (ref 39.0–52.0)
Hemoglobin: 12.6 g/dL — ABNORMAL LOW (ref 13.0–17.0)
O2 Saturation: 63 %
Potassium: 5.3 mmol/L — ABNORMAL HIGH (ref 3.5–5.1)
Sodium: 127 mmol/L — ABNORMAL LOW (ref 135–145)
TCO2: 27 mmol/L (ref 22–32)
pCO2, Ven: 48.6 mmHg (ref 44.0–60.0)
pH, Ven: 7.335 (ref 7.250–7.430)
pO2, Ven: 35 mmHg (ref 32.0–45.0)

## 2019-04-23 LAB — I-STAT CHEM 8, ED
BUN: 35 mg/dL — ABNORMAL HIGH (ref 6–20)
Calcium, Ion: 1 mmol/L — ABNORMAL LOW (ref 1.15–1.40)
Chloride: 88 mmol/L — ABNORMAL LOW (ref 98–111)
Creatinine, Ser: 2.1 mg/dL — ABNORMAL HIGH (ref 0.61–1.24)
Glucose, Bld: >700 mg/dL (ref 70–99)
HCT: 41 % (ref 39.0–52.0)
Hemoglobin: 13.9 g/dL (ref 13.0–17.0)
Potassium: 5.2 mmol/L — ABNORMAL HIGH (ref 3.5–5.1)
Sodium: 122 mmol/L — ABNORMAL LOW (ref 135–145)
TCO2: 23 mmol/L (ref 22–32)

## 2019-04-23 LAB — BASIC METABOLIC PANEL
Anion gap: 13 (ref 5–15)
Anion gap: 12 (ref 5–15)
BUN: 30 mg/dL — ABNORMAL HIGH (ref 6–20)
BUN: 27 mg/dL — ABNORMAL HIGH (ref 6–20)
CO2: 23 mmol/L (ref 22–32)
CO2: 25 mmol/L (ref 22–32)
Calcium: 9.2 mg/dL (ref 8.9–10.3)
Calcium: 9.5 mg/dL (ref 8.9–10.3)
Chloride: 99 mmol/L (ref 98–111)
Chloride: 103 mmol/L (ref 98–111)
Creatinine, Ser: 1.94 mg/dL — ABNORMAL HIGH (ref 0.61–1.24)
Creatinine, Ser: 1.62 mg/dL — ABNORMAL HIGH (ref 0.61–1.24)
GFR calc Af Amer: 45 mL/min — ABNORMAL LOW (ref >60)
GFR calc Af Amer: 56 mL/min — ABNORMAL LOW (ref >60)
GFR calc non Af Amer: 39 mL/min — ABNORMAL LOW (ref >60)
GFR calc non Af Amer: 48 mL/min — ABNORMAL LOW (ref >60)
Glucose, Bld: 372 mg/dL — ABNORMAL HIGH (ref 70–99)
Glucose, Bld: 82 mg/dL (ref 70–99)
Potassium: 4.1 mmol/L (ref 3.5–5.1)
Potassium: 3.9 mmol/L (ref 3.5–5.1)
Sodium: 135 mmol/L (ref 135–145)
Sodium: 140 mmol/L (ref 135–145)

## 2019-04-23 LAB — COMPREHENSIVE METABOLIC PANEL
ALT: 27 U/L (ref 0–44)
AST: 31 U/L (ref 15–41)
Albumin: 4.5 g/dL (ref 3.5–5.0)
Alkaline Phosphatase: 184 U/L — ABNORMAL HIGH (ref 38–126)
Anion gap: 17 — ABNORMAL HIGH (ref 5–15)
BUN: 32 mg/dL — ABNORMAL HIGH (ref 6–20)
CO2: 21 mmol/L — ABNORMAL LOW (ref 22–32)
Calcium: 9.4 mg/dL (ref 8.9–10.3)
Chloride: 85 mmol/L — ABNORMAL LOW (ref 98–111)
Creatinine, Ser: 2.21 mg/dL — ABNORMAL HIGH (ref 0.61–1.24)
GFR calc Af Amer: 38 mL/min — ABNORMAL LOW (ref >60)
GFR calc non Af Amer: 33 mL/min — ABNORMAL LOW (ref >60)
Glucose, Bld: 1017 mg/dL (ref 70–99)
Potassium: 5.3 mmol/L — ABNORMAL HIGH (ref 3.5–5.1)
Sodium: 123 mmol/L — ABNORMAL LOW (ref 135–145)
Total Bilirubin: 1.1 mg/dL (ref 0.3–1.2)
Total Protein: 7.7 g/dL (ref 6.5–8.1)

## 2019-04-23 LAB — HEMOGLOBIN A1C
Hgb A1c MFr Bld: 16.8 % — ABNORMAL HIGH (ref 4.8–5.6)
Mean Plasma Glucose: 435.46 mg/dL

## 2019-04-23 LAB — TROPONIN I (HIGH SENSITIVITY)
Troponin I (High Sensitivity): 8 ng/L (ref <18)
Troponin I (High Sensitivity): 9 ng/L (ref <18)

## 2019-04-23 LAB — GLUCOSE, CAPILLARY
Glucose-Capillary: 99 mg/dL (ref 70–99)
Glucose-Capillary: 415 mg/dL — ABNORMAL HIGH (ref 70–99)
Glucose-Capillary: 344 mg/dL — ABNORMAL HIGH (ref 70–99)

## 2019-04-23 LAB — URINALYSIS, ROUTINE W REFLEX MICROSCOPIC
Bilirubin Urine: NEGATIVE
Glucose, UA: >=500 mg/dL — AB
Ketones, ur: NEGATIVE mg/dL
Leukocytes,Ua: NEGATIVE
Nitrite: NEGATIVE
Protein, ur: NEGATIVE mg/dL
Specific Gravity, Urine: 1.021 (ref 1.005–1.030)
pH: 6 (ref 5.0–8.0)

## 2019-04-23 LAB — CBC
HCT: 34.7 % — ABNORMAL LOW (ref 39.0–52.0)
Hemoglobin: 11.6 g/dL — ABNORMAL LOW (ref 13.0–17.0)
MCH: 31.3 pg (ref 26.0–34.0)
MCHC: 33.4 g/dL (ref 30.0–36.0)
MCV: 93.5 fL (ref 80.0–100.0)
Platelets: 247 10*3/uL (ref 150–400)
RBC: 3.71 MIL/uL — ABNORMAL LOW (ref 4.22–5.81)
RDW: 12.6 % (ref 11.5–15.5)
WBC: 3.9 10*3/uL — ABNORMAL LOW (ref 4.0–10.5)
nRBC: 0 % (ref 0.0–0.2)

## 2019-04-23 LAB — CBG MONITORING, ED
Glucose-Capillary: >600 mg/dL (ref 70–99)
Glucose-Capillary: >600 mg/dL (ref 70–99)
Glucose-Capillary: >600 mg/dL (ref 70–99)
Glucose-Capillary: 351 mg/dL — ABNORMAL HIGH (ref 70–99)
Glucose-Capillary: 177 mg/dL — ABNORMAL HIGH (ref 70–99)
Glucose-Capillary: 104 mg/dL — ABNORMAL HIGH (ref 70–99)

## 2019-04-23 LAB — LIPASE, BLOOD: Lipase: 66 U/L — ABNORMAL HIGH (ref 11–51)

## 2019-04-23 LAB — BETA-HYDROXYBUTYRIC ACID: Beta-Hydroxybutyric Acid: 0.99 mmol/L — ABNORMAL HIGH (ref 0.05–0.27)

## 2019-04-23 MED ORDER — ONDANSETRON HCL 4 MG PO TABS
4.0000 mg | ORAL_TABLET | Freq: Four times a day (QID) | ORAL | Status: DC
  Administered 2019-04-23 – 2019-04-24 (×2): 4 mg via ORAL
  Filled 2019-04-23 (×2): qty 1

## 2019-04-23 MED ORDER — INSULIN REGULAR(HUMAN) IN NACL 100-0.9 UT/100ML-% IV SOLN: INTRAVENOUS | Status: DC

## 2019-04-23 MED ORDER — DEXTROSE-NACL 5-0.45 % IV SOLN: INTRAVENOUS | Status: DC

## 2019-04-23 MED ORDER — SODIUM CHLORIDE 0.9 % IV SOLN: INTRAVENOUS | Status: DC

## 2019-04-23 MED ORDER — ENOXAPARIN SODIUM 40 MG/0.4ML ~~LOC~~ SOLN
40.0000 mg | SUBCUTANEOUS | Status: DC
  Administered 2019-04-23: 40 mg via SUBCUTANEOUS
  Filled 2019-04-23: qty 0.4

## 2019-04-23 MED ORDER — DEXTROSE 50 % IV SOLN: 0.0000 mL | INTRAVENOUS | Status: DC | PRN

## 2019-04-23 MED ORDER — POTASSIUM CHLORIDE CRYS ER 20 MEQ PO TBCR
40.0000 meq | EXTENDED_RELEASE_TABLET | Freq: Once | ORAL | Status: AC
  Administered 2019-04-23: 40 meq via ORAL
  Filled 2019-04-23: qty 2

## 2019-04-23 MED ORDER — INSULIN ASPART 100 UNIT/ML ~~LOC~~ SOLN
0.0000 [IU] | Freq: Every day | SUBCUTANEOUS | Status: DC
  Administered 2019-04-23: 5 [IU] via SUBCUTANEOUS

## 2019-04-23 MED ORDER — SODIUM CHLORIDE 0.9 % IV BOLUS
1000.0000 mL | Freq: Once | INTRAVENOUS | Status: AC
  Administered 2019-04-23: 1000 mL via INTRAVENOUS

## 2019-04-23 MED ORDER — SODIUM CHLORIDE 0.9 % IV BOLUS
1000.0000 mL | INTRAVENOUS | Status: AC
  Administered 2019-04-23 (×2): 1000 mL via INTRAVENOUS

## 2019-04-23 MED ORDER — INSULIN REGULAR(HUMAN) IN NACL 100-0.9 UT/100ML-% IV SOLN
INTRAVENOUS | Status: DC
  Administered 2019-04-23: 10.5 [IU]/h via INTRAVENOUS
  Filled 2019-04-23: qty 100

## 2019-04-23 MED ORDER — PRAVASTATIN SODIUM 10 MG PO TABS
10.0000 mg | ORAL_TABLET | Freq: Every day | ORAL | Status: DC
  Administered 2019-04-23 – 2019-04-24 (×2): 10 mg via ORAL
  Filled 2019-04-23 (×2): qty 1

## 2019-04-23 MED ORDER — INSULIN ASPART 100 UNIT/ML ~~LOC~~ SOLN
0.0000 [IU] | Freq: Three times a day (TID) | SUBCUTANEOUS | Status: DC
  Administered 2019-04-24: 2 [IU] via SUBCUTANEOUS
  Administered 2019-04-24: 9 [IU] via SUBCUTANEOUS

## 2019-04-23 MED ORDER — INSULIN GLARGINE 100 UNIT/ML ~~LOC~~ SOLN
10.0000 [IU] | Freq: Every day | SUBCUTANEOUS | Status: DC
  Administered 2019-04-23 – 2019-04-24 (×2): 10 [IU] via SUBCUTANEOUS
  Filled 2019-04-23 (×2): qty 0.1

## 2019-04-23 NOTE — H&P (Addendum)
[facility] Admission History and Physical Service Pager: [phone]  Patient name: James Bradford Medical record number: [id number] Date of birth: July 23, 1965 Age: 54 y.o. Gender: male  Primary Care Provider: Medicine, [facility]: n/a Code Status:  DNR Preferred Emergency Contact: Sister [phone]  Chief Complaint: Hyperglycemia  Assessment and Plan: James Bradford is a 54 y.o. male presenting with hyperglycemia and medication refill. PMH is significant for type 2 diabetes.  Hyperglycemia with concern for HSS James Bradford is a 54 year old male who presents today for hyperglycemia after being out of his home medications for 1 month.  Home meds include lisinopril, Metformin, glipizide and Percocet.  Patient reports that he had a appointment with PCP on 18th meds refill however missed this as he was arrested and taken to jail.  On admission glucose BMP 1017, bicarb 21, gap 17, K 5.3 and pH 7.33. Treating as DKA but more likely to be HSS given glucose >1000 and only mild acidemia.  -Admit to MedSurg, attending Dr. Ardelia Mems -Vitals per floor routine -Up with assistance -CBGs 4 hourly -BMP 4 hourly  -Continue insulin drip endotool  -Continue endotool N.S75cc/hr -Transition to D5 when CBGs<250 -diabetes educator  -TOC consult to ensure pcp f/u  Hyponatremia Na123 on admission, pseudohyponatremia due to hyperglycemia.  Corrected sodium 138-145. -Continue to monitor with BMP  Hyperkalemia K 5.3 on admission -Monitor with  BMP -Continue insulin drip  -Replete with K<4  Type 2 DM  A1c today 16.8. A1c 6.5 in November 2020. Glucose on BMP today 1017>700>372 Home meds: Glipizide 15mg  once daily, Metformin 1 g twice daily -Hold home meds -Continue insulin drip   AKI on CKD Cr 2.21 on admission. Baseline appears 1.2-1.8 -Continue N.S 75cc/hr -Monitor with BMP  -consider nephro referal outpatient  HTN BP 130/91 Home meds:  Lisinpril 10mg  once daily  -Continue lisinopril  HLD Pravastatin 10mg  once daily -Continue statin  FEN/GI: N.p.o. Prophylaxis: Lovenox  Disposition: Inpatient for next 1 to 2 days pending resolution of HSS  History of Present Illness:  James Bradford is a 54 y.o. male presenting with hyperglycemia.  Patient is a type II diabetic since 2007 and ran out of diabetic medications 1 month ago (Metformin and glipizide).  He is usually consistent with his medications.  He started "feeling bad" with polyuria for 1 month, blurred vision, polydipsia and headaches.  Admits that he has had at least 3 admissions for hypoglycemia in the past. He booked an appointment to see PCP on 18 March however was unable to attend appointment as he was arrested during that time and was in jail last week.  In jail they gave him insulin which she reports does not help his blood sugars.  He also experienced mild abdominal pain while in jail however this is likely incidental. Says he came in today because he was out of his meds too long and knew that his diabetes was out of hand.   He has a family history of diabetes, amputations, blindness and dialysis.  He wants to avoid these things.  Denies fevers, no chest pain,sob, cough dysuria, hematuria or bowel issues. Has felt his heart racing once for about a minute and when he laid down it stopped. Denies covid contacts.   Review Of Systems: Per HPI with the following additions:  ROS  Patient Active Problem List   Diagnosis Date Noted  . Type 2 diabetes mellitus with hyperosmolar hyperglycemic state (HHS) ([place]) 04/23/2019  . Hyperlipidemia   . Stage 3  chronic kidney disease   . HYPERCHOLESTEROLEMIA 02/26/2009  . ERECTILE DYSFUNCTION 04/27/2008  . GLAUCOMA 02/18/2008  . Essential hypertension 02/16/2008  . DIABETES MELLITUS, TYPE II 02/03/2005    Past Medical History: Past Medical History:  Diagnosis Date  . Diabetes mellitus without complication (HCC)   .  Hypertension     Past Surgical History: Past Surgical History:  Procedure Laterality Date  . HAND SURGERY    . LEG SURGERY      Social History: Social History   Tobacco Use  . Smoking status: Never Smoker  . Smokeless tobacco: Never Used  Substance Use Topics  . Alcohol use: No  . Drug use: No   Additional social history:  Please also refer to relevant sections of EMR.  Family History: fam hx of diabetes related amputations, blindness and dialysis  Allergies and Medications: Allergies  Allergen Reactions  . Ibuprofen Other (See Comments)    Hurts his stomach   . Aspirin Other (See Comments)    Stomach irritation   No current facility-administered medications on file prior to encounter.   Current Outpatient Medications on File Prior to Encounter  Medication Sig Dispense Refill  . glipiZIDE (GLUCOTROL XL) 10 MG 24 hr tablet Take 1 tablet (10 mg total) by mouth daily with breakfast. (Patient taking differently: Take 15 mg by mouth daily with breakfast. ) 30 tablet 1  . KRILL OIL PO Take 1 capsule by mouth daily.    Marland Kitchen lisinopril (ZESTRIL) 10 MG tablet Take 1 tablet (10 mg total) by mouth daily. 30 tablet 0  . metFORMIN (GLUCOPHAGE) 1000 MG tablet Take 1,000 mg by mouth 2 (two) times daily with a meal.    . Multiple Vitamin (MULTIVITAMIN ADULT PO) Take 1 tablet by mouth daily.    . pravastatin (PRAVACHOL) 10 MG tablet Take 1 tablet (10 mg total) by mouth daily. 30 tablet 1  . metFORMIN (GLUCOPHAGE-XR) 750 MG 24 hr tablet I tablet BID with food (Patient not taking: Reported on 04/23/2019) 60 tablet 0  . ondansetron (ZOFRAN) 4 MG tablet Take 1 tablet (4 mg total) by mouth every 6 (six) hours. (Patient not taking: Reported on 04/23/2019) 20 tablet 0    Objective: BP 107/85 (BP Location: Right Arm)   Pulse (!) 117   Temp 97.7 F (36.5 C) (Oral)   Resp 20   Ht 5\' 7"  (1.702 m)   Wt 78.5 kg   SpO2 100%   BMI 27.11 kg/m   Exam: General: Well appearing 54 year old male,  appears stated age, pleasant Eyes: No scleral icterus, normal extraocular eye movements, PERRLA ENTM: No pharyngeal edema or exudates, normal dentition Neck: Supple, range normal range of movement Cardiovascular: S1 and S2 present, RRR Respiratory: CTAB, normal work of breathing Gastrointestinal: abdomen soft nontender, bowel sounds present MSK: Moving all 4 limbs equally Derm: Warm and dry Neuro: cranial nerves grossly intact Psych: Normal mood, normal affect  Labs and Imaging: CBC BMET  Recent Labs  Lab 04/23/19 1506  WBC 3.9*  HGB 11.6*  HCT 34.7*  PLT 247   Recent Labs  Lab 04/23/19 1506  NA 135  K 4.1  CL 99  CO2 23  BUN 30*  CREATININE 1.94*  GLUCOSE 372*  CALCIUM 9.2     EKG: SR   Dr. 04/25/19  PGY-1, [facility] FPTS Intern pager: [phone], text pages welcome  FPTS Upper-Level Resident Addendum   I have independently interviewed and examined the patient. I have discussed the above with the  original Chartered loss adjuster and agree with their documentation. My edits for correction/addition/clarification are in blue. Please see also any attending notes.    Marthenia Rolling, DO PGY-3, [facility] 04/23/2019 6:27 PM  FPTS Service pager: [phone] (text pages welcome through [facility])

## 2019-04-23 NOTE — Progress Notes (Signed)
10 units lantus given (see MAR), CBG checked 415. Paged on call MD. Gave patient max amount of sliding scale( 5 units). No new orders. Will continue to monitor

## 2019-04-23 NOTE — ED Provider Notes (Signed)
[facility] EMERGENCY DEPARTMENT Provider Note   CSN: [id number] Arrival date & time: 04/23/19  1122     History Chief Complaint  Patient presents with  . Hyperglycemia    James Bradford is a 54 y.o. male history of hypertension and diabetes presents today for hyperglycemia and medication refill.  Patient reports that he has been out of his home medications including lisinopril, Metformin, glipizide and Percocet for greater than 1 month.  He reports that he has not been monitoring his blood sugars at home.  Patient reports that on Tuesday he was arrested and taken to jail, there is a noted his blood sugar to be elevated and started him on insulin which per patient did not help and blood sugar levels remained high throughout his stay.  He reports that on Wednesday he developed abdominal pain a mild cramping of his lower abdomen which was constant radiating to both sides of his abdomen and occasionally to his testicles, no clear aggravating or alleviating factors and pain has gradually resolved.  He denies any pain to his testicles at this time.  He denies fever/chills, headache, chest pain/shortness of breath, nausea/vomiting, diarrhea, dysuria/hematuria, testicular swelling, penile discharge or any additional concerns.    HPI     Past Medical History:  Diagnosis Date  . Diabetes mellitus without complication ([place])   . Hypertension     Patient Active Problem List   Diagnosis Date Noted  . Type 2 diabetes mellitus with hyperosmolar hyperglycemic state (HHS) ([place]) 04/23/2019  . HYPERCHOLESTEROLEMIA 02/26/2009  . ERECTILE DYSFUNCTION 04/27/2008  . GLAUCOMA 02/18/2008  . HYPERTENSION, BENIGN ESSENTIAL 02/16/2008  . DIABETES MELLITUS, TYPE II 02/03/2005    Past Surgical History:  Procedure Laterality Date  . HAND SURGERY    . LEG SURGERY         No family history on file.  Social History   Tobacco Use  . Smoking status: Never Smoker  . Smokeless  tobacco: Never Used  Substance Use Topics  . Alcohol use: No  . Drug use: No    Home Medications Prior to Admission medications   Medication Sig Start Date End Date Taking? Authorizing Provider  glipiZIDE (GLUCOTROL XL) 10 MG 24 hr tablet Take 1 tablet (10 mg total) by mouth daily with breakfast. 02/20/19   Fondaw, Kathleene Hazel, PA  lisinopril (ZESTRIL) 10 MG tablet Take 1 tablet (10 mg total) by mouth daily. 02/20/19   Tedd Sias, PA  metFORMIN (GLUCOPHAGE-XR) 750 MG 24 hr tablet I tablet BID with food 02/20/19   Fondaw, Wylder S, PA  ondansetron (ZOFRAN) 4 MG tablet Take 1 tablet (4 mg total) by mouth every 6 (six) hours. 02/20/19   Tedd Sias, PA  pravastatin (PRAVACHOL) 10 MG tablet Take 1 tablet (10 mg total) by mouth daily. 02/20/19   Tedd Sias, PA    Allergies    Ibuprofen and Aspirin  Review of Systems   Review of Systems Ten systems are reviewed and are negative for acute change except as noted in the HPI  Physical Exam Updated Vital Signs BP (!) 134/92   Pulse 86   Temp 98.7 F (37.1 C) (Oral)   Resp 14   SpO2 100%   Physical Exam Constitutional:      General: He is not in acute distress.    Appearance: Normal appearance. He is well-developed. He is not ill-appearing or diaphoretic.  HENT:     Head: Normocephalic and atraumatic.     Right  Ear: External ear normal.     Left Ear: External ear normal.     Nose: Nose normal.  Eyes:     General: Vision grossly intact. Gaze aligned appropriately.     Pupils: Pupils are equal, round, and reactive to light.  Neck:     Trachea: Trachea and phonation normal. No tracheal deviation.  Cardiovascular:     Rate and Rhythm: Normal rate and regular rhythm.     Pulses: Normal pulses.  Pulmonary:     Effort: Pulmonary effort is normal. No respiratory distress.     Breath sounds: Normal breath sounds.  Abdominal:     General: There is no distension.     Palpations: Abdomen is soft.     Tenderness: There is no  abdominal tenderness. There is no guarding or rebound.  Genitourinary:    Comments: Patient refused GU examination Musculoskeletal:        General: Normal range of motion.     Cervical back: Normal range of motion.  Skin:    General: Skin is warm and dry.  Neurological:     Mental Status: He is alert.     GCS: GCS eye subscore is 4. GCS verbal subscore is 5. GCS motor subscore is 6.     Comments: Speech is clear and goal oriented, follows commands Major Cranial nerves without deficit, no facial droop Moves extremities without ataxia, coordination intact  Psychiatric:        Behavior: Behavior normal.     ED Results / Procedures / Treatments   Labs (all labs ordered are listed, but only abnormal results are displayed) Labs Reviewed  CBC WITH DIFFERENTIAL/PLATELET - Abnormal; Notable for the following components:      Result Value   RBC 3.98 (*)    Hemoglobin 12.4 (*)    HCT 37.7 (*)    All other components within normal limits  COMPREHENSIVE METABOLIC PANEL - Abnormal; Notable for the following components:   Sodium 123 (*)    Potassium 5.3 (*)    Chloride 85 (*)    CO2 21 (*)    Glucose, Bld 1,017 (*)    BUN 32 (*)    Creatinine, Ser 2.21 (*)    Alkaline Phosphatase 184 (*)    GFR calc non Af Amer 33 (*)    GFR calc Af Amer 38 (*)    Anion gap 17 (*)    All other components within normal limits  LIPASE, BLOOD - Abnormal; Notable for the following components:   Lipase 66 (*)    All other components within normal limits  URINALYSIS, ROUTINE W REFLEX MICROSCOPIC - Abnormal; Notable for the following components:   Color, Urine COLORLESS (*)    Glucose, UA >=500 (*)    Hgb urine dipstick SMALL (*)    Bacteria, UA RARE (*)    All other components within normal limits  CBG MONITORING, ED - Abnormal; Notable for the following components:   Glucose-Capillary >600 (*)    All other components within normal limits  I-STAT CHEM 8, ED - Abnormal; Notable for the following  components:   Sodium 122 (*)    Potassium 5.2 (*)    Chloride 88 (*)    BUN 35 (*)    Creatinine, Ser 2.10 (*)    Glucose, Bld >700 (*)    Calcium, Ion 1.00 (*)    All other components within normal limits  CBG MONITORING, ED - Abnormal; Notable for the following components:   Glucose-Capillary >600 (*)  All other components within normal limits  POCT I-STAT EG7 - Abnormal; Notable for the following components:   Sodium 127 (*)    Potassium 5.3 (*)    HCT 37.0 (*)    Hemoglobin 12.6 (*)    All other components within normal limits  CBG MONITORING, ED - Abnormal; Notable for the following components:   Glucose-Capillary >600 (*)    All other components within normal limits  SARS CORONAVIRUS 2 (TAT 6-24 HRS)  BETA-HYDROXYBUTYRIC ACID  HIV ANTIBODY (ROUTINE TESTING W REFLEX)  CBC  BASIC METABOLIC PANEL  BASIC METABOLIC PANEL  BASIC METABOLIC PANEL  BASIC METABOLIC PANEL  OSMOLALITY  HEMOGLOBIN A1C  I-STAT VENOUS BLOOD GAS, ED  TROPONIN I (HIGH SENSITIVITY)    EKG EKG Interpretation  Date/Time:  Saturday April 23 2019 12:56:51 EDT Ventricular Rate:  84 PR Interval:    QRS Duration: 81 QT Interval:  337 QTC Calculation: 399 R Axis:   4 Text Interpretation: Sinus rhythm ST elev, probable normal early repol pattern Confirmed by Kristine Royal [phone]) on 04/23/2019 1:03:30 PM   Radiology No results found.  Procedures .Critical Care Performed by: Bill Salinas, PA-C Authorized by: Bill Salinas, PA-C   Critical care provider statement:    Critical care time (minutes):  31   Critical care was time spent personally by me on the following activities:  Discussions with consultants, evaluation of patient's response to treatment, examination of patient, ordering and performing treatments and interventions, ordering and review of laboratory studies, ordering and review of radiographic studies, pulse oximetry, re-evaluation of patient's condition, obtaining history  from patient or surrogate, review of old charts and development of treatment plan with patient or surrogate   (including critical care time)  Medications Ordered in ED Medications  sodium chloride 0.9 % bolus 1,000 mL (1,000 mLs Intravenous New Bag/Given 04/23/19 1323)  pravastatin (PRAVACHOL) tablet 10 mg (has no administration in time range)  ondansetron (ZOFRAN) tablet 4 mg (has no administration in time range)  enoxaparin (LOVENOX) injection 40 mg (has no administration in time range)  insulin regular, human (MYXREDLIN) 100 units/ 100 mL infusion (has no administration in time range)  0.9 %  sodium chloride infusion (has no administration in time range)  dextrose 5 %-0.45 % sodium chloride infusion (has no administration in time range)  dextrose 50 % solution 0-50 mL (has no administration in time range)  sodium chloride 0.9 % bolus 1,000 mL (1,000 mLs Intravenous New Bag/Given 04/23/19 1219)    ED Course  I have reviewed the triage vital signs and the nursing notes.  Pertinent labs & imaging results that were available during my care of the patient were reviewed by me and considered in my medical decision making (see chart for details).  Clinical Course as of Apr 22 1421  Sat Apr 23, 2019  1335 Dr. Parke Simmers   [BM]    Clinical Course User Index [BM] Elizabeth Palau   MDM Rules/Calculators/A&P                     54 year old male presents today for hyperglycemia, blood glucose in triage greater than 600, he has been out of his glipizide and Metformin for greater than 1 month.  He developed abdominal pain the day after he started on insulin in jail which has completely resolved now.  He is well-appearing no acute distress fully alert and oriented, vital signs stable no clue small respirations.  Abdomen soft nontender without peritoneal signs.  He did report some radiation of pain down to his testicles 2-3 days ago but this has completely resolved and he denied any testicular  swelling urinary symptoms or penile discharge, he refused GU examination today.  Will obtain abdominal labs, clinically concern for possible hypoglycemic crises, will give 1 L fluid bolus and reassess. - CBC shows hemoglobin 12.4, slightly decreased from prior, no bleeding symptoms, no leukocytosis to suggest infection.  Lipase slightly elevated at 66, suspect this is secondary to hyperglycemia today.  Urinalysis shows greater than 500 glucose, small hemoglobin rare bacteria, no ketones.  CMP shows glucose 1017, bicarb 21, gap 17, potassium 5.3, sodium 123, chloride 85, creatinine 2.21 and BUN 32.  It appears patient may be experiencing a mild DKA, will obtain VBG to assess pH, plan to start patient on hyperglycemic order set and admit to medicine. - Patient reassessed updated on findings as above and states understanding, he is agreeable for admission and has no further questions at this time.  Denies any abdominal pain, reassessment he is well-appearing and in no acute distress vital signs stable no indication for CT imaging of the abdomen or any further work-up in the ER at this time, consult placed to unassigned medicine. - Discussed the case with family medicine, Dr. Parke Simmers who is accepted patient to his service for further evaluation and treatment.  Case discussed with Dr. Rodena Medin who agrees with work-up and admission.  Note: Portions of this report may have been transcribed using voice recognition software. Every effort was made to ensure accuracy; however, inadvertent computerized transcription errors may still be present. Final Clinical Impression(s) / ED Diagnoses Final diagnoses:  Type 2 diabetes mellitus with hyperosmolar hyperglycemic state (HHS) [facility])    Rx / DC Orders ED Discharge Orders    None       Elizabeth Palau 04/23/19 1423    Wynetta Fines, MD 04/24/19 1054

## 2019-04-23 NOTE — ED Triage Notes (Signed)
C/o high blood sugar since Tuesday.  States he was in jail and they gave him insulin and had difficulty getting it down.  Reports lower abd pain since Wednesday.  Without medications for 1 month.

## 2019-04-23 NOTE — Plan of Care (Signed)
  Problem: Clinical Measurements: Goal: Diagnostic test results will improve Outcome: Progressing   Problem: Activity: Goal: Risk for activity intolerance will decrease Outcome: Progressing   

## 2019-04-23 NOTE — ED Notes (Signed)
[phone] called for report, no answer.

## 2019-04-24 LAB — GLUCOSE, CAPILLARY
Glucose-Capillary: 216 mg/dL — ABNORMAL HIGH (ref 70–99)
Glucose-Capillary: 166 mg/dL — ABNORMAL HIGH (ref 70–99)
Glucose-Capillary: 372 mg/dL — ABNORMAL HIGH (ref 70–99)
Glucose-Capillary: 314 mg/dL — ABNORMAL HIGH (ref 70–99)

## 2019-04-24 LAB — BASIC METABOLIC PANEL
Anion gap: 11 (ref 5–15)
BUN: 24 mg/dL — ABNORMAL HIGH (ref 6–20)
CO2: 23 mmol/L (ref 22–32)
Calcium: 9.1 mg/dL (ref 8.9–10.3)
Chloride: 102 mmol/L (ref 98–111)
Creatinine, Ser: 1.43 mg/dL — ABNORMAL HIGH (ref 0.61–1.24)
GFR calc Af Amer: >60 mL/min (ref >60)
GFR calc non Af Amer: 56 mL/min — ABNORMAL LOW (ref >60)
Glucose, Bld: 200 mg/dL — ABNORMAL HIGH (ref 70–99)
Potassium: 3.9 mmol/L (ref 3.5–5.1)
Sodium: 136 mmol/L (ref 135–145)

## 2019-04-24 LAB — OSMOLALITY: Osmolality: 314 mOsm/kg — ABNORMAL HIGH (ref 275–295)

## 2019-04-24 LAB — SARS CORONAVIRUS 2 (TAT 6-24 HRS): SARS Coronavirus 2: NEGATIVE

## 2019-04-24 MED ORDER — PRAVASTATIN SODIUM 10 MG PO TABS: 10.0000 mg | ORAL_TABLET | Freq: Every day | ORAL | 2 refills | Status: DC

## 2019-04-24 MED ORDER — LISINOPRIL 10 MG PO TABS: 10.0000 mg | ORAL_TABLET | Freq: Every day | ORAL | 2 refills | Status: DC

## 2019-04-24 MED ORDER — ACCU-CHEK GUIDE W/DEVICE KIT: 1.0000 | PACK | Freq: Once | 0 refills | Status: AC

## 2019-04-24 MED ORDER — ACETAMINOPHEN 325 MG PO TABS
650.0000 mg | ORAL_TABLET | Freq: Four times a day (QID) | ORAL | Status: DC | PRN
  Administered 2019-04-24 (×2): 650 mg via ORAL
  Filled 2019-04-24 (×2): qty 2

## 2019-04-24 MED ORDER — METFORMIN HCL 1000 MG PO TABS: 1000.0000 mg | ORAL_TABLET | Freq: Two times a day (BID) | ORAL | 2 refills | Status: DC

## 2019-04-24 MED ORDER — ACCU-CHEK GUIDE W/DEVICE KIT: 1.0000 | PACK | Freq: Once | 0 refills | Status: DC

## 2019-04-24 MED ORDER — ACCU-CHEK GUIDE VI STRP: ORAL_STRIP | 12 refills | Status: DC

## 2019-04-24 MED ORDER — DIPHENHYDRAMINE HCL 25 MG PO CAPS
25.0000 mg | ORAL_CAPSULE | Freq: Once | ORAL | Status: AC
  Administered 2019-04-24: 25 mg via ORAL
  Filled 2019-04-24: qty 1

## 2019-04-24 MED ORDER — GLIPIZIDE ER 5 MG PO TB24: 15.0000 mg | ORAL_TABLET | Freq: Every day | ORAL | 2 refills | Status: DC

## 2019-04-24 MED ORDER — ACCU-CHEK SOFTCLIX LANCETS MISC: 12 refills | Status: DC

## 2019-04-24 NOTE — Progress Notes (Signed)
Patient c/o increased pain from left side of head to back of head. Patient states that his side no longer hurts but headache has moved to back of head 6 out of 10 pain. Notified MD. Awaiting call back.

## 2019-04-24 NOTE — Discharge Instructions (Signed)
Thank you for allowing [place] to participate in your care!    You were admitted in a hyperglycemic hyperosmolar state.  This was a result of poorly controlled diabetes.  We stabilized your sugars and tract other electrolytes and everything improved.  We would like you to start back on your Metformin 1000 mg twice daily and glipizide 15 mg daily.  We have scheduled you an appointment with our clinic to have a hospital follow-up as well as establish care.  That appointment is scheduled for Tuesday 3/23 at 1:30 PM.  Please show up 15 minutes prior.  We sent your prescriptions for your Metformin and glipizide as well as pravastatin and lisinopril to the [facility] on [address].  If you have any questions or concerns please feel free to call the [facility] we are happy to help.  If you experience worsening of your admission symptoms, develop shortness of breath, life threatening emergency, suicidal or homicidal thoughts you must seek medical attention immediately by calling 911 or calling your MD immediately  if symptoms less severe.   Hyperglycemic Hyperosmolar State Hyperglycemic hyperosmolar state is a serious condition in which you experience an extreme increase in your blood sugar (glucose) level. This makes your body become extremely dehydrated, which can be life-threatening. This condition is a result of uncontrolled or undiagnosed diabetes. It occurs most often in people who have type 2 diabetes (type 2 diabetes mellitus). Certain hormones (insulin and glucagon) control the level of glucose that is in the blood. Insulin lowers blood glucose, and glucagon increases blood glucose. Hyperglycemia can result from having too little insulin in the bloodstream, or from the body not responding normally to insulin. Normally, the body gets rid of excess glucose through urine. If you do not drink enough fluids, or if you drink fluids that contain sugar, your body cannot get rid of excess glucose.  This can result in hyperglycemic hyperosmolar state. What are the causes? This condition may be caused by:  Infection.  Medicines that cause you to become dehydrated or cause you to lose fluid.  Certain illnesses.  Not taking your diabetes medicine.  New onset or diagnosis of diabetes.  Cardiovascular disease (CVD). What increases the risk? The following factors make you more likely to develop this condition:  Older age.  Poor management of diabetes.  Inability to eat or drink normally.  Heart failure.  Infection.  Surgery.  Illness. What are the signs or symptoms? Symptoms of this condition include:  Extreme or increased thirst. This symptom may gradually disappear.  Needing to urinate more often than usual.  Dry mouth.  Warm, dry skin that does not sweat even in high temperatures.  High fever.  Sleepiness or confusion.  Vision problems or vision loss.  Seeing, hearing, tasting, smelling, or feeling things that are not real (hallucinations).  Weakness.  Weight loss.  Vomiting. How is this diagnosed? Hyperglycemic hyperosmolar state is diagnosed based on your medical history, your symptoms, and a blood test to measure your blood glucose level. How is this treated? This condition is treated in the hospital. The goals of treatment are:  To correct dehydration by replacing fluids that you have lost. Fluids will be given through an IV tube.  To improve blood sugar levels using insulin or other medicines as needed.  To treat the cause of hyperglycemia, such as an infection, illness, or newly diagnosed diabetes. Follow these instructions at home: General instructions  Take over-the-counter and prescription medicines only as told by your  health care provider.  Do not use any products that contain nicotine or tobacco, such as cigarettes and e-cigarettes. If you need help quitting, ask your health care provider.  Limit alcohol intake to no more than 1  drink a day for nonpregnant women and 2 drinks a day for men. One drink equals 12 oz of beer, 5 oz of wine, or 1 oz of hard liquor.  Stay hydrated, especially when you exercise, when you get sick, or when you spend time in hot temperatures.  Learn to manage stress. If you need help with this, ask your health care provider.  Keep all follow-up visits as told by your health care provider. This is important. Eating and drinking   Maintain a healthy weight.  Exercise regularly, as directed by your health care provider.  Eat healthy foods, such as: ? Lean proteins. ? Complex carbohydrates. ? Fresh fruits and vegetables. ? Low-fat dairy products. ? Healthy fats.  Drink enough fluid to keep your urine clear or pale yellow. If You Have Diabetes:   Make sure you know the early signs and symptoms of hyperglycemia.  Follow your diabetes management plan, as told by your health care provider. Make sure you: ? Take your insulin and medicines as directed. ? Follow your exercise plan. ? Follow your meal plan. Eat on time, and do not skip meals. ? Check your blood glucose as often as directed. Make sure to check your blood glucose before and after exercise. If you exercise longer or in a different way than usual, check your blood glucose more often. ? Follow your sick day plan whenever you cannot eat or drink normally. Make this plan in advance with your health care provider.  Share your diabetes management plan with people in your workplace, school, and household.  Check your urine for ketones when you are ill and as often as told by your health care provider.  Carry a medical alert card or wear medical alert jewelry. Contact a health care provider if:  You cannot eat or drink without throwing up.  You develop a fever. Get help right away if:  You develop symptoms of hyperglycemic hyperosmolar state. These symptoms may represent a serious problem that is an emergency. Do not wait to  see if the symptoms will go away. Get medical help right away. Call your local emergency services (911 in the [place] Do not drive yourself to the hospital. Summary  Hyperglycemic hyperosmolar state is a serious condition in which you experience an extreme increase in your blood sugar (glucose) level. This makes your body become extremely dehydrated, which can be life-threatening.  This condition is a result of uncontrolled or undiagnosed diabetes. It occurs most often in people who have type 2 diabetes (type 2 diabetes mellitus).  This condition is treated in the hospital. Treatment may include fluids given through an IV tube and other medicines.  Make sure you know the early signs and symptoms of hyperglycemia.  Follow your diabetes management plan, as told by your health care provider. This information is not intended to replace advice given to you by your health care provider. Make sure you discuss any questions you have with your health care provider. Document Revised: 01/14/2016 Document Reviewed: 01/14/2016 Elsevier Patient Education  2020 [employer].

## 2019-04-24 NOTE — Progress Notes (Addendum)
Patient c/o itching all over. Headache pain left side/left eye. Patient is able to follow finger with eyes. No deficit. Orders received benadryl once; tylenol

## 2019-04-24 NOTE — Discharge Summary (Addendum)
[facility] Discharge Summary  Patient name: James Bradford Medical record number: [id number] Date of birth: [birth year] Age: 54 y.o. Gender: male Date of Admission: 04/23/2019  Date of Discharge: 04/24/2019 Admitting Physician: Sherene Sires, DO  Primary Care Provider: [facility], [facility]: None  Indication for Hospitalization: HHS  Discharge Diagnoses/Problem List:  HHS Hyponatremia Hyperkalemia Type 2 diabetes AKI on CKD Hypertension Hyperlipidemia  Disposition: Home  Discharge Condition: Improved, stable  Discharge Exam:   Objective: Temp:  [97.7 F (36.5 C)-99.9 F (37.7 C)] 99.9 F (37.7 C) (03/21 0505) Pulse Rate:  [72-117] 90 (03/21 0505) Resp:  [10-26] 21 (03/21 0505) BP: (107-155)/(80-104) 112/85 (03/21 0505) SpO2:  [98 %-100 %] 100 % (03/21 0505) Weight:  [78.5 kg] 78.5 kg (03/20 1803) Physical Exam: General: Sitting on the side of the bed Cardiovascular: Regular rate and rhythm, no murmurs appreciated Respiratory: Clear to auscultation bilaterally Abdomen: Soft, nontender Extremities: No lower extremity edema noted  Brief Hospital Course:  Patient presented to the emergency room with hyperglycemia of 1017.  He had been out of his home diabetes medications for over a month.  On admission BMP his glucose was 1017 bicarb 21, anion gap of 17, potassium 5.3, pH of 7.33.  Patient reports that his last hemoglobin A1c was it was 6.5 in November 2020.  Hemoglobin A1c on admission was 16.8.  Being treated as DKA but most likely HHS given how high his blood glucose was elevated with only mild acidemia.  He blood sugars were controlled with insulin drip and then he was transitioned to D5 and then eventually that was discontinued.  Patient was given 10 units of Lantus in the morning and had additional sliding scale available.  His blood pressures were better controlled and patient was other was stable.  His electrolyte  abnormalities had corrected and patient was discharged with prescriptions sent to his pharmacy for refills on his diabetes medications.  Prescription was also sent for a new glucometer and test strips.  He was scheduled for a hospital follow-up appointment on 3/23 with [facility].  Issues for Follow Up:  1. 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  Unsure of how accurate this may be given a hemoglobin of 16.8.  May require insulin in the near future 2. Patient had no access to his glucometer because it was at another location in the state at the time of discharge.  Sent a new prescription to patient's pharmacy but he may still have no access to glucometer at the time of follow-up appointment. 3. BMP to ensure AKI has completely resolved. 4. Consider nephrology consult given patient's CKD which she has not been evaluated for  Significant Procedures: None  Significant Labs and Imaging:  Recent Labs  Lab 04/23/19 1146 04/23/19 1146 04/23/19 1203 04/23/19 1328 04/23/19 1506  WBC 4.3  --   --   --  3.9*  HGB 12.4*   < > 13.9 12.6* 11.6*  HCT 37.7*   < > 41.0 37.0* 34.7*  PLT 265  --   --   --  247   < > = values in this interval not displayed.   Recent Labs  Lab 04/23/19 1146 04/23/19 1146 04/23/19 1203 04/23/19 1203 04/23/19 1328 04/23/19 1328 04/23/19 1506 04/23/19 1506 04/23/19 1734 04/24/19 0238  NA 123*   < > 122*  --  127*  --  135  --  140 136  K  5.3*   < > 5.2*   < > 5.3*   < > 4.1   < > 3.9 3.9  CL 85*  --  88*  --   --   --  99  --  103 102  CO2 21*  --   --   --   --   --  23  --  25 23  GLUCOSE 1,017*  --  >700*  --   --   --  372*  --  82 200*  BUN 32*  --  35*  --   --   --  30*  --  27* 24*  CREATININE 2.21*  --  2.10*  --   --   --  1.94*  --  1.62* 1.43*  CALCIUM 9.4  --   --   --   --   --  9.2  --  9.5 9.1  ALKPHOS 184*  --   --   --   --   --   --   --   --   --   AST 31  --    --   --   --   --   --   --   --   --   ALT 27  --   --   --   --   --   --   --   --   --   ALBUMIN 4.5  --   --   --   --   --   --   --   --   --    < > = values in this interval not displayed.    Hemoglobin A1c 16.8  Results/Tests Pending at Time of Discharge: None  Discharge Medications:  Allergies as of 04/24/2019      Reactions   Ibuprofen Other (See Comments)   Hurts his stomach   Aspirin Other (See Comments)   Stomach irritation      Medication List    STOP taking these medications   ondansetron 4 MG tablet Commonly known as: ZOFRAN     TAKE these medications   Accu-Chek Guide test strip Generic drug: glucose blood Use as instructed   Accu-Chek Guide w/Device Kit 1 Device by Does not apply route once for 1 dose.   Accu-Chek Softclix Lancets lancets Use as instructed   glipiZIDE 5 MG 24 hr tablet Commonly known as: GLUCOTROL XL Take 3 tablets (15 mg total) by mouth daily with breakfast. What changed:   medication strength  how much to take   KRILL OIL PO Take 1 capsule by mouth daily.   lisinopril 10 MG tablet Commonly known as: ZESTRIL Take 1 tablet (10 mg total) by mouth daily.   metFORMIN 1000 MG tablet Commonly known as: GLUCOPHAGE Take 1 tablet (1,000 mg total) by mouth 2 (two) times daily with a meal. What changed: Another medication with the same name was removed. Continue taking this medication, and follow the directions you see here.   MULTIVITAMIN ADULT PO Take 1 tablet by mouth daily.   pravastatin 10 MG tablet Commonly known as: PRAVACHOL Take 1 tablet (10 mg total) by mouth daily.       Discharge Instructions: Please refer to Patient Instructions section of EMR for full details.  Patient was counseled important signs and symptoms that should prompt return to medical care, changes in medications, dietary instructions, activity restrictions, and follow up appointments.   Follow-Up Appointments: Follow-up  Information    [facility]. Go on 04/26/2019.   Why: at 1:30 pm.  Please arrive 15 minutes prior to your appointment time.  If you have symptoms of COVID-19 or a known exposure, please call the clinic before you come. Contact information: [address]          Gifford Shave, MD 04/24/2019, 2:51 PM PGY-1, [place]

## 2019-04-24 NOTE — Plan of Care (Signed)
DISCHARGE NOTE HOME James Bradford to be discharged home per MD order. Discussed prescriptions and follow up appointments with the patient. Prescriptions given to patient; medication list explained in detail. Patient verbalized understanding.  Skin clean, dry and intact without evidence of skin break down, no evidence of skin tears noted. IV catheter discontinued intact. Site without signs and symptoms of complications. Dressing and pressure applied. Pt denies pain at the site currently. No complaints noted.  Patient free of lines, drains, and wounds.   An After Visit Summary (AVS) was printed and given to the patient. Patient escorted via wheelchair, and discharged home via private auto.  Arlice Colt, RN

## 2019-04-26 ENCOUNTER — Inpatient Hospital Stay: Payer: Self-pay

## 2019-10-25 ENCOUNTER — Encounter (HOSPITAL_COMMUNITY): Payer: Self-pay | Admitting: Emergency Medicine

## 2019-10-25 ENCOUNTER — Ambulatory Visit (HOSPITAL_COMMUNITY)
Admission: EM | Admit: 2019-10-25 | Discharge: 2019-10-25 | Disposition: A | Discharge status: Home / Self Care | Payer: Self-pay | Attending: Internal Medicine | Admitting: Internal Medicine

## 2019-10-25 ENCOUNTER — Other Ambulatory Visit: Payer: Self-pay

## 2019-10-25 DIAGNOSIS — R1084 Generalized abdominal pain: Secondary | ICD-10-CM | POA: Insufficient documentation

## 2019-10-25 LAB — COMPREHENSIVE METABOLIC PANEL
ALT: 26 U/L (ref 0–44)
AST: 28 U/L (ref 15–41)
Albumin: 4.7 g/dL (ref 3.5–5.0)
Alkaline Phosphatase: 76 U/L (ref 38–126)
Anion gap: 11 (ref 5–15)
BUN: 28 mg/dL — ABNORMAL HIGH (ref 6–20)
CO2: 22 mmol/L (ref 22–32)
Calcium: 10 mg/dL (ref 8.9–10.3)
Chloride: 105 mmol/L (ref 98–111)
Creatinine, Ser: 1.54 mg/dL — ABNORMAL HIGH (ref 0.61–1.24)
GFR calc Af Amer: 59 mL/min — ABNORMAL LOW (ref >60)
GFR calc non Af Amer: 51 mL/min — ABNORMAL LOW (ref >60)
Glucose, Bld: 248 mg/dL — ABNORMAL HIGH (ref 70–99)
Potassium: 5.3 mmol/L — ABNORMAL HIGH (ref 3.5–5.1)
Sodium: 138 mmol/L (ref 135–145)
Total Bilirubin: 0.5 mg/dL (ref 0.3–1.2)
Total Protein: 8 g/dL (ref 6.5–8.1)

## 2019-10-25 LAB — CBC WITH DIFFERENTIAL/PLATELET
Abs Immature Granulocytes: 0.02 10*3/uL (ref 0.00–0.07)
Basophils Absolute: 0 10*3/uL (ref 0.0–0.1)
Basophils Relative: 1 %
Eosinophils Absolute: 0.1 10*3/uL (ref 0.0–0.5)
Eosinophils Relative: 2 %
HCT: 35.5 % — ABNORMAL LOW (ref 39.0–52.0)
Hemoglobin: 11.5 g/dL — ABNORMAL LOW (ref 13.0–17.0)
Immature Granulocytes: 0 %
Lymphocytes Relative: 25 %
Lymphs Abs: 1.3 10*3/uL (ref 0.7–4.0)
MCH: 30.9 pg (ref 26.0–34.0)
MCHC: 32.4 g/dL (ref 30.0–36.0)
MCV: 95.4 fL (ref 80.0–100.0)
Monocytes Absolute: 0.5 10*3/uL (ref 0.1–1.0)
Monocytes Relative: 8 %
Neutro Abs: 3.4 10*3/uL (ref 1.7–7.7)
Neutrophils Relative %: 64 %
Platelets: 348 10*3/uL (ref 150–400)
RBC: 3.72 MIL/uL — ABNORMAL LOW (ref 4.22–5.81)
RDW: 14 % (ref 11.5–15.5)
WBC: 5.3 10*3/uL (ref 4.0–10.5)
nRBC: 0 % (ref 0.0–0.2)

## 2019-10-25 MED ORDER — PANTOPRAZOLE SODIUM 20 MG PO TBEC: 20.0000 mg | DELAYED_RELEASE_TABLET | Freq: Every day | ORAL | 0 refills | Status: DC

## 2019-10-25 NOTE — ED Provider Notes (Signed)
[facility]    CSN: [id number] Arrival date & time: 10/25/19  1038      History   Chief Complaint Chief Complaint  Patient presents with  . Abdominal Pain    HPI James Bradford is a 54 y.o. male comes to urgent care with complaints of generalized abdominal pain of about 1 week duration.  Patient says that the pain is constant and it stays in the periumbilical area.  Patient has a history of severe gastroesophageal reflux disease.  Pain is not aggravated by food or hunger.  No weight loss.  Patient denies any constipation or diarrhea.  No abdominal bloating.  No dysuria urgency or frequency.  No difficulty swallowing or chest pain on swallowing.Marland Kitchen   HPI  Past Medical History:  Diagnosis Date  . Diabetes mellitus without complication (HCC)   . Hypertension     Patient Active Problem List   Diagnosis Date Noted  . Type 2 diabetes mellitus with hyperosmolar hyperglycemic state (HHS) (HCC) 04/23/2019  . Hyperlipidemia   . Stage 3 chronic kidney disease   . HYPERCHOLESTEROLEMIA 02/26/2009  . ERECTILE DYSFUNCTION 04/27/2008  . GLAUCOMA 02/18/2008  . Essential hypertension 02/16/2008  . DIABETES MELLITUS, TYPE II 02/03/2005    Past Surgical History:  Procedure Laterality Date  . HAND SURGERY    . LEG SURGERY         Home Medications    Prior to Admission medications   Medication Sig Start Date End Date Taking? Authorizing Provider  Accu-Chek Softclix Lancets lancets Use as instructed 04/24/19   Derrel Nip, MD  glipiZIDE (GLUCOTROL XL) 5 MG 24 hr tablet Take 3 tablets (15 mg total) by mouth daily with breakfast. 04/24/19   Derrel Nip, MD  glucose blood (ACCU-CHEK GUIDE) test strip Use as instructed 04/24/19   Derrel Nip, MD  KRILL OIL PO Take 1 capsule by mouth daily.    [provider]  lisinopril (ZESTRIL) 10 MG tablet Take 1 tablet (10 mg total) by mouth daily. 04/24/19   Derrel Nip, MD  metFORMIN (GLUCOPHAGE) 1000 MG tablet  Take 1 tablet (1,000 mg total) by mouth 2 (two) times daily with a meal. 04/24/19   Derrel Nip, MD  Multiple Vitamin (MULTIVITAMIN ADULT PO) Take 1 tablet by mouth daily.    [provider]  pantoprazole (PROTONIX) 20 MG tablet Take 1 tablet (20 mg total) by mouth daily. 10/25/19   Merrilee Jansky, MD  pravastatin (PRAVACHOL) 10 MG tablet Take 1 tablet (10 mg total) by mouth daily. 04/24/19   Derrel Nip, MD    Family History History reviewed. No pertinent family history.  Social History Social History   Tobacco Use  . Smoking status: Never Smoker  . Smokeless tobacco: Never Used  Substance Use Topics  . Alcohol use: No  . Drug use: No     Allergies   Ibuprofen and Aspirin   Review of Systems Review of Systems care HPI   Physical Exam Triage Vital Signs ED Triage Vitals  Enc Vitals Group     BP 10/25/19 1139 (!) 141/77     Pulse Rate 10/25/19 1139 95     Resp 10/25/19 1139 18     Temp 10/25/19 1139 98.7 F (37.1 C)     Temp Source 10/25/19 1139 Oral     SpO2 10/25/19 1139 100 %     Weight --      Height --      Head Circumference --  Peak Flow --      Pain Score 10/25/19 1140 5     Pain Loc --      Pain Edu? --      Excl. in GC? --    No data found.  Updated Vital Signs BP (!) 141/77 (BP Location: Right Arm)   Pulse 95   Temp 98.7 F (37.1 C) (Oral)   Resp 18   SpO2 100%   Visual Acuity Right Eye Distance:   Left Eye Distance:   Bilateral Distance:    Right Eye Near:   Left Eye Near:    Bilateral Near:     Physical Exam Vitals and nursing note reviewed.  Constitutional:      General: He is not in acute distress.    Appearance: He is not ill-appearing.  Cardiovascular:     Rate and Rhythm: Normal rate and regular rhythm.  Abdominal:     General: Bowel sounds are normal.     Palpations: Abdomen is soft. There is no shifting dullness or fluid wave.     Tenderness: There is no abdominal tenderness.     Hernia: No hernia  is present.  Skin:    General: Skin is warm.     Capillary Refill: Capillary refill takes less than 2 seconds.     Coloration: Skin is not cyanotic, mottled or pale.  Neurological:     Mental Status: He is alert.      UC Treatments / Results  Labs (all labs ordered are listed, but only abnormal results are displayed) Labs Reviewed  CBC WITH DIFFERENTIAL/PLATELET  COMPREHENSIVE METABOLIC PANEL    EKG   Radiology No results found.  Procedures Procedures (including critical care time)  Medications Ordered in UC Medications - No data to display  Initial Impression / Assessment and Plan / UC Course  I have reviewed the triage vital signs and the nursing notes.  Pertinent labs & imaging results that were available during my care of the patient were reviewed by me and considered in my medical decision making (see chart for details).     Generalized abdominal pain likely gastroesophageal reflux disease: Protonix 20 mg orally daily If symptoms worsen or does not improve after 30 days- patient will need to be evaluated by gastroenterologist for endoscopic evaluation Return precautions given.. Final Clinical Impressions(s) / UC Diagnoses   Final diagnoses:  Generalized abdominal pain   Discharge Instructions   None    ED Prescriptions    Medication Sig Dispense Auth. Provider   pantoprazole (PROTONIX) 20 MG tablet Take 1 tablet (20 mg total) by mouth daily. 30 tablet Laroy Mustard, Britta Mccreedy, MD     PDMP not reviewed this encounter.   Merrilee Jansky, MD 10/25/19 1415

## 2019-10-25 NOTE — ED Triage Notes (Signed)
Patient presents to [facility] for assessment of 1 week of generalized abdominal pain, constant but moving in nature.  Patient states hx of DM and takes metformin.  Denies diarrhea or constipation.  Denies emesis, c/o intermittent nausea.  Denies changes in urination.

## 2019-12-25 ENCOUNTER — Other Ambulatory Visit: Payer: Self-pay

## 2019-12-25 ENCOUNTER — Ambulatory Visit (HOSPITAL_COMMUNITY)
Admission: EM | Admit: 2019-12-25 | Discharge: 2019-12-25 | Disposition: A | Discharge status: Home / Self Care | Payer: Self-pay

## 2019-12-25 ENCOUNTER — Encounter (HOSPITAL_COMMUNITY): Payer: Self-pay

## 2019-12-25 DIAGNOSIS — R197 Diarrhea, unspecified: Secondary | ICD-10-CM

## 2019-12-25 DIAGNOSIS — R1084 Generalized abdominal pain: Secondary | ICD-10-CM

## 2019-12-25 DIAGNOSIS — R110 Nausea: Secondary | ICD-10-CM

## 2019-12-25 MED ORDER — DICYCLOMINE HCL 20 MG PO TABS: 20.0000 mg | ORAL_TABLET | Freq: Two times a day (BID) | ORAL | 0 refills | Status: DC

## 2019-12-25 NOTE — Discharge Instructions (Signed)
Ripple is a dairy free brand of milk that some people like to drink  Continue home medication regimen  I have sent in dicyclomine for you to take twice a day as needed for abdominal cramping  Follow up with this office or with primary care if symptoms are persisting.  Follow up in the ER for high fever, trouble swallowing, trouble breathing, other concerning symptoms.

## 2019-12-25 NOTE — ED Triage Notes (Signed)
Pt presents with abdominal pain and diarrhea x 2 days. States the abdominal pain is "all over", pt had more than 4 loose stools a day. Pt think the symptoms are related to medications he used for diabetes, as he run off the medications x 1 week and started again 2 days ago. Denies fever, chills.

## 2019-12-25 NOTE — ED Provider Notes (Signed)
[facility]   [id number] 12/25/19 Arrival Time: 1037  CC: ABDOMINAL PAIN  SUBJECTIVE:  James Bradford is a 54 y.o. male who presents with complaint of abdominal discomfort that began abruptly about 2 days ago. Reports nausea and diarrhea for the last 2 days as well. Reports that he was out of his metformin for about a week. Reports that symptoms began when he began his metformin again. He takes 1000mg  BID. Takes protonix to help with GERD. Has appt with PCP in January. Has not taken OTC medications for this. Denies alleviating or aggravating factors. Denies similar symptoms in the past.  Denies fever, chills, appetite changes, weight changes, vomiting, chest pain, SOB, constipation, hematochezia, melena, dysuria, difficulty urinating, increased frequency or urgency, flank pain ROS: As per HPI.  All other pertinent ROS negative.     Past Medical History:  Diagnosis Date  . Diabetes mellitus without complication (HCC)   . Hypertension    Past Surgical History:  Procedure Laterality Date  . HAND SURGERY    . LEG SURGERY     Allergies  Allergen Reactions  . Ibuprofen Other (See Comments)    Hurts his stomach   . Aspirin Other (See Comments)    Stomach irritation   No current facility-administered medications on file prior to encounter.   Current Outpatient Medications on File Prior to Encounter  Medication Sig Dispense Refill  . Accu-Chek Softclix Lancets lancets Use as instructed 100 each 12  . glipiZIDE (GLUCOTROL XL) 5 MG 24 hr tablet Take 3 tablets (15 mg total) by mouth daily with breakfast. 90 tablet 2  . glucose blood (ACCU-CHEK GUIDE) test strip Use as instructed 100 each 12  . KRILL OIL PO Take 1 capsule by mouth daily.    February lisinopril (ZESTRIL) 10 MG tablet Take 1 tablet (10 mg total) by mouth daily. 30 tablet 2  . metFORMIN (GLUCOPHAGE) 1000 MG tablet Take 1 tablet (1,000 mg total) by mouth 2 (two) times daily with a meal. 60 tablet 2  . Multiple Vitamin  (MULTIVITAMIN ADULT PO) Take 1 tablet by mouth daily.    . pantoprazole (PROTONIX) 20 MG tablet Take 1 tablet (20 mg total) by mouth daily. 30 tablet 0  . pravastatin (PRAVACHOL) 10 MG tablet Take 1 tablet (10 mg total) by mouth daily. 30 tablet 2  . sildenafil (VIAGRA) 100 MG tablet Take by mouth at bedtime.     Social History   Socioeconomic History  . Marital status: Single    Spouse name: Not on file  . Number of children: Not on file  . Years of education: Not on file  . Highest education level: Not on file  Occupational History  . Not on file  Tobacco Use  . Smoking status: Never Smoker  . Smokeless tobacco: Never Used  Substance and Sexual Activity  . Alcohol use: No  . Drug use: No  . Sexual activity: Not on file  Other Topics Concern  . Not on file  Social History Narrative  . Not on file   Social Determinants of Health   Financial Resource Strain:   . Difficulty of Paying Living Expenses: Not on file  Food Insecurity:   . Worried About Marland Kitchen in the Last Year: Not on file  . Ran Out of Food in the Last Year: Not on file  Transportation Needs:   . Lack of Transportation (Medical): Not on file  . Lack of Transportation (Non-Medical): Not on file  Physical Activity:   .  Days of Exercise per Week: Not on file  . Minutes of Exercise per Session: Not on file  Stress:   . Feeling of Stress : Not on file  Social Connections:   . Frequency of Communication with Friends and Family: Not on file  . Frequency of Social Gatherings with Friends and Family: Not on file  . Attends Religious Services: Not on file  . Active Member of Clubs or Organizations: Not on file  . Attends Banker Meetings: Not on file  . Marital Status: Not on file  Intimate Partner Violence:   . Fear of Current or Ex-Partner: Not on file  . Emotionally Abused: Not on file  . Physically Abused: Not on file  . Sexually Abused: Not on file   History reviewed. No pertinent  family history.   OBJECTIVE:  Vitals:   12/25/19 1131  BP: 112/74  Pulse: 85  Resp: 18  Temp: 98.8 F (37.1 C)  TempSrc: Oral  SpO2: 98%    General appearance: Alert; NAD HEENT: NCAT.  Oropharynx clear.  Lungs: clear to auscultation bilaterally without adventitious breath sounds Heart: regular rate and rhythm.  Radial pulses 2+ symmetrical bilaterally Abdomen: soft, non-distended; normal active bowel sounds; non-tender to light and deep palpation; nontender at McBurney's point; negative Murphy's sign; negative rebound; no guarding Back: no CVA tenderness Extremities: no edema; symmetrical with no gross deformities Skin: warm and dry Neurologic: normal gait Psychological: alert and cooperative; normal mood and affect  LABS: No results found for this or any previous visit (from the past 24 hour(s)).  DIAGNOSTIC STUDIES: No results found.   ASSESSMENT & PLAN:  1. Diarrhea, unspecified type   2. Nausea   3. Generalized abdominal pain     Meds ordered this encounter  Medications  . dicyclomine (BENTYL) 20 MG tablet    Sig: Take 1 tablet (20 mg total) by mouth 2 (two) times daily.    Dispense:  20 tablet    Refill:  0    Order Specific Question:   Supervising Provider    Answer:   Merrilee Jansky [id number]    Prescribed dicyclomine for abdominal cramping Take as directed If symptoms are persisting, follow up with PCP to possibly alter diabetes meds. Suspect that GI symptoms are coming from abruptly stopping and starting metformin If you experience new or worsening symptoms return or go to ER such as fever, chills, nausea, vomiting, diarrhea, bloody or dark tarry stools, constipation, urinary symptoms, worsening abdominal discomfort, symptoms that do not improve with medications, inability to keep fluids down.  Reviewed expectations re: course of current medical issues. Questions answered. Outlined signs and symptoms indicating need for more acute  intervention. Patient verbalized understanding. After Visit Summary given.   Moshe Cipro, NP 12/25/19 1644

## 2020-03-21 ENCOUNTER — Encounter (HOSPITAL_COMMUNITY): Payer: Self-pay

## 2020-03-21 ENCOUNTER — Ambulatory Visit (HOSPITAL_COMMUNITY)
Admission: EM | Admit: 2020-03-21 | Discharge: 2020-03-21 | Disposition: A | Discharge status: Home / Self Care | Payer: Self-pay | Attending: Medical Oncology | Admitting: Medical Oncology

## 2020-03-21 ENCOUNTER — Other Ambulatory Visit: Payer: Self-pay

## 2020-03-21 DIAGNOSIS — E1165 Type 2 diabetes mellitus with hyperglycemia: Secondary | ICD-10-CM | POA: Insufficient documentation

## 2020-03-21 DIAGNOSIS — R1031 Right lower quadrant pain: Secondary | ICD-10-CM | POA: Insufficient documentation

## 2020-03-21 LAB — POCT URINALYSIS DIPSTICK, ED / UC
Bilirubin Urine: NEGATIVE
Glucose, UA: 500 mg/dL — AB
Hgb urine dipstick: NEGATIVE
Ketones, ur: NEGATIVE mg/dL
Leukocytes,Ua: NEGATIVE
Nitrite: NEGATIVE
Protein, ur: 30 mg/dL — AB
Specific Gravity, Urine: 1.025 (ref 1.005–1.030)
Urobilinogen, UA: 0.2 mg/dL (ref 0.0–1.0)
pH: 5.5 (ref 5.0–8.0)

## 2020-03-21 LAB — CBG MONITORING, ED: Glucose-Capillary: 233 mg/dL — ABNORMAL HIGH (ref 70–99)

## 2020-03-21 NOTE — ED Triage Notes (Signed)
Pt presents with lower abdominal pain x 4 days. Pt states he has not taken pain relief medicine. He states he is a diabetic and believes something may be wrong. He states he needs a note for work due to him being out since Sunday. He states the abdominal pain has gotten worse.

## 2020-03-21 NOTE — ED Provider Notes (Signed)
[facility]    CSN: [id number] Arrival date & time: 03/21/20  0815      History   Chief Complaint Chief Complaint  Patient presents with  . Abdominal Pain    HPI James Bradford is a 55 y.o. male.   HPI   Abdominal Pain: Pt reports lower abdominal pain for the past 4 days. Pain described as cramp like and is rated as 6-7/10. Feels that it may be due to him not having had a bowel movement in 3 days. Feels like he may need to have one soon. He has not taken anything for pain. He reports that the "reason [he] came was for a work note". He denies chest pain, SOB, dysuria, N/V/D. Of note he has had his appendix removed.   Past Medical History:  Diagnosis Date  . Diabetes mellitus without complication (HCC)   . Hypertension     Patient Active Problem List   Diagnosis Date Noted  . Type 2 diabetes mellitus with hyperosmolar hyperglycemic state (HHS) (HCC) 04/23/2019  . Hyperlipidemia   . Stage 3 chronic kidney disease (HCC)   . HYPERCHOLESTEROLEMIA 02/26/2009  . ERECTILE DYSFUNCTION 04/27/2008  . GLAUCOMA 02/18/2008  . Essential hypertension 02/16/2008  . DIABETES MELLITUS, TYPE II 02/03/2005    Past Surgical History:  Procedure Laterality Date  . HAND SURGERY    . LEG SURGERY         Home Medications    Prior to Admission medications   Medication Sig Start Date End Date Taking? Authorizing Provider  Accu-Chek Softclix Lancets lancets Use as instructed 04/24/19   Derrel Nip, MD  dicyclomine (BENTYL) 20 MG tablet Take 1 tablet (20 mg total) by mouth 2 (two) times daily. 12/25/19   Moshe Cipro, NP  glipiZIDE (GLUCOTROL XL) 5 MG 24 hr tablet Take 3 tablets (15 mg total) by mouth daily with breakfast. 04/24/19   Derrel Nip, MD  glucose blood (ACCU-CHEK GUIDE) test strip Use as instructed 04/24/19   Derrel Nip, MD  KRILL OIL PO Take 1 capsule by mouth daily.    [provider]  lisinopril (ZESTRIL) 10 MG tablet Take 1 tablet (10  mg total) by mouth daily. 04/24/19   Derrel Nip, MD  metFORMIN (GLUCOPHAGE) 1000 MG tablet Take 1 tablet (1,000 mg total) by mouth 2 (two) times daily with a meal. 04/24/19   Derrel Nip, MD  Multiple Vitamin (MULTIVITAMIN ADULT PO) Take 1 tablet by mouth daily.    [provider]  pantoprazole (PROTONIX) 20 MG tablet Take 1 tablet (20 mg total) by mouth daily. 10/25/19   Merrilee Jansky, MD  pravastatin (PRAVACHOL) 10 MG tablet Take 1 tablet (10 mg total) by mouth daily. 04/24/19   Derrel Nip, MD  sildenafil (VIAGRA) 100 MG tablet Take by mouth at bedtime. 12/21/19   [provider]    Family History History reviewed. No pertinent family history.  Social History Social History   Tobacco Use  . Smoking status: Never Smoker  . Smokeless tobacco: Never Used  Substance Use Topics  . Alcohol use: No  . Drug use: No     Allergies   Ibuprofen and Aspirin   Review of Systems Review of Systems  As stated above in HPI Physical Exam Triage Vital Signs ED Triage Vitals  Enc Vitals Group     BP 03/21/20 0852 124/85     Pulse Rate 03/21/20 0852 82     Resp 03/21/20 0852 17     Temp 03/21/20  0852 98.2 F (36.8 C)     Temp Source 03/21/20 0852 Oral     SpO2 03/21/20 0852 98 %     Weight --      Height --      Head Circumference --      Peak Flow --      Pain Score 03/21/20 0850 7     Pain Loc --      Pain Edu? --      Excl. in GC? --    No data found.  Updated Vital Signs BP 124/85 (BP Location: Right Arm)   Pulse 82   Temp 98.2 F (36.8 C) (Oral)   Resp 17   SpO2 98%   Physical Exam Vitals and nursing note reviewed.  Constitutional:      General: He is not in acute distress.    Appearance: He is not ill-appearing, toxic-appearing or diaphoretic.  HENT:     Head: Normocephalic.     Mouth/Throat:     Mouth: Mucous membranes are moist.  Eyes:     Comments: No jaundice or pallor  Cardiovascular:     Rate and Rhythm: Normal rate  and regular rhythm.     Heart sounds: Normal heart sounds.  Pulmonary:     Effort: Pulmonary effort is normal.     Breath sounds: Normal breath sounds.  Abdominal:     General: Abdomen is flat. Bowel sounds are normal. There is no distension or abdominal bruit. There are no signs of injury.     Palpations: Abdomen is soft. There is no shifting dullness, fluid wave, hepatomegaly, splenomegaly, mass or pulsatile mass.     Tenderness: There is abdominal tenderness in the right lower quadrant. There is no right CVA tenderness, left CVA tenderness, guarding or rebound. Negative signs include Murphy's sign, Rovsing's sign, McBurney's sign, psoas sign and obturator sign.     Hernia: No hernia is present.     Comments: Mild palpable stool burden   Neurological:     Mental Status: He is alert.      UC Treatments / Results  Labs (all labs ordered are listed, but only abnormal results are displayed) Labs Reviewed - No data to display  EKG   Radiology No results found.  Procedures Procedures (including critical care time)  Medications Ordered in UC Medications - No data to display  Initial Impression / Assessment and Plan / UC Course  I have reviewed the triage vital signs and the nursing notes.  Pertinent labs & imaging results that were available during my care of the patient were reviewed by me and considered in my medical decision making (see chart for details).     New. Wide differential. Culturing his urine. I want him to see if his abd pain resolved after bowel movement. If pain worsens or he is unable to have a bowel movement he will need to go to the ER for further work up. For now he will stay hydrated and follow a low carbohydrate diet. He has also been instructed to monitor his glucose values closely and follow up with his PCP within 1 week. Work note given for today.  Final Clinical Impressions(s) / UC Diagnoses   Final diagnoses:  None   Discharge Instructions    None    ED Prescriptions    None     PDMP not reviewed this encounter.   Rushie Chestnut, [place] 03/21/20 1037

## 2020-03-22 LAB — URINE CULTURE: Culture: NO GROWTH

## 2020-05-13 ENCOUNTER — Encounter (HOSPITAL_COMMUNITY): Payer: Self-pay | Admitting: Emergency Medicine

## 2020-05-13 ENCOUNTER — Ambulatory Visit (HOSPITAL_COMMUNITY)
Admission: EM | Admit: 2020-05-13 | Discharge: 2020-05-13 | Disposition: A | Discharge status: Home / Self Care | Payer: Self-pay | Attending: Physician Assistant | Admitting: Physician Assistant

## 2020-05-13 DIAGNOSIS — Z79899 Other long term (current) drug therapy: Secondary | ICD-10-CM | POA: Insufficient documentation

## 2020-05-13 DIAGNOSIS — Z20822 Contact with and (suspected) exposure to covid-19: Secondary | ICD-10-CM | POA: Insufficient documentation

## 2020-05-13 DIAGNOSIS — Z28310 Unvaccinated for covid-19: Secondary | ICD-10-CM | POA: Insufficient documentation

## 2020-05-13 DIAGNOSIS — Z9049 Acquired absence of other specified parts of digestive tract: Secondary | ICD-10-CM | POA: Insufficient documentation

## 2020-05-13 DIAGNOSIS — R197 Diarrhea, unspecified: Secondary | ICD-10-CM | POA: Insufficient documentation

## 2020-05-13 DIAGNOSIS — E118 Type 2 diabetes mellitus with unspecified complications: Secondary | ICD-10-CM

## 2020-05-13 DIAGNOSIS — R112 Nausea with vomiting, unspecified: Secondary | ICD-10-CM | POA: Insufficient documentation

## 2020-05-13 DIAGNOSIS — E119 Type 2 diabetes mellitus without complications: Secondary | ICD-10-CM | POA: Insufficient documentation

## 2020-05-13 DIAGNOSIS — Z7984 Long term (current) use of oral hypoglycemic drugs: Secondary | ICD-10-CM | POA: Insufficient documentation

## 2020-05-13 LAB — COMPREHENSIVE METABOLIC PANEL
ALT: 25 U/L (ref 0–44)
AST: 25 U/L (ref 15–41)
Albumin: 4.7 g/dL (ref 3.5–5.0)
Alkaline Phosphatase: 59 U/L (ref 38–126)
Anion gap: 9 (ref 5–15)
BUN: 33 mg/dL — ABNORMAL HIGH (ref 6–20)
CO2: 22 mmol/L (ref 22–32)
Calcium: 9.9 mg/dL (ref 8.9–10.3)
Chloride: 105 mmol/L (ref 98–111)
Creatinine, Ser: 1.54 mg/dL — ABNORMAL HIGH (ref 0.61–1.24)
GFR, Estimated: 54 mL/min — ABNORMAL LOW (ref >60)
Glucose, Bld: 240 mg/dL — ABNORMAL HIGH (ref 70–99)
Potassium: 4.9 mmol/L (ref 3.5–5.1)
Sodium: 136 mmol/L (ref 135–145)
Total Bilirubin: 0.7 mg/dL (ref 0.3–1.2)
Total Protein: 8.1 g/dL (ref 6.5–8.1)

## 2020-05-13 LAB — CBC WITH DIFFERENTIAL/PLATELET
Abs Immature Granulocytes: 0.01 10*3/uL (ref 0.00–0.07)
Basophils Absolute: 0 10*3/uL (ref 0.0–0.1)
Basophils Relative: 1 %
Eosinophils Absolute: 0.1 10*3/uL (ref 0.0–0.5)
Eosinophils Relative: 2 %
HCT: 36.6 % — ABNORMAL LOW (ref 39.0–52.0)
Hemoglobin: 11.9 g/dL — ABNORMAL LOW (ref 13.0–17.0)
Immature Granulocytes: 0 %
Lymphocytes Relative: 31 %
Lymphs Abs: 1.3 10*3/uL (ref 0.7–4.0)
MCH: 31 pg (ref 26.0–34.0)
MCHC: 32.5 g/dL (ref 30.0–36.0)
MCV: 95.3 fL (ref 80.0–100.0)
Monocytes Absolute: 0.3 10*3/uL (ref 0.1–1.0)
Monocytes Relative: 7 %
Neutro Abs: 2.5 10*3/uL (ref 1.7–7.7)
Neutrophils Relative %: 59 %
Platelets: 309 10*3/uL (ref 150–400)
RBC: 3.84 MIL/uL — ABNORMAL LOW (ref 4.22–5.81)
RDW: 12.6 % (ref 11.5–15.5)
WBC: 4.2 10*3/uL (ref 4.0–10.5)
nRBC: 0 % (ref 0.0–0.2)

## 2020-05-13 LAB — POCT URINALYSIS DIPSTICK, ED / UC
Bilirubin Urine: NEGATIVE
Glucose, UA: 100 mg/dL — AB
Hgb urine dipstick: NEGATIVE
Ketones, ur: NEGATIVE mg/dL
Leukocytes,Ua: NEGATIVE
Nitrite: NEGATIVE
Protein, ur: 30 mg/dL — AB
Specific Gravity, Urine: >=1.03 (ref 1.005–1.030)
Urobilinogen, UA: 0.2 mg/dL (ref 0.0–1.0)
pH: 5 (ref 5.0–8.0)

## 2020-05-13 LAB — SARS CORONAVIRUS 2 (TAT 6-24 HRS): SARS Coronavirus 2: NEGATIVE

## 2020-05-13 LAB — CBG MONITORING, ED: Glucose-Capillary: 211 mg/dL — ABNORMAL HIGH (ref 70–99)

## 2020-05-13 MED ORDER — ONDANSETRON 4 MG PO TBDP: 4.0000 mg | ORAL_TABLET | Freq: Three times a day (TID) | ORAL | 0 refills | Status: DC | PRN

## 2020-05-13 NOTE — ED Provider Notes (Signed)
[facility]    CSN: [id number] Arrival date & time: 05/13/20  1013      History   Chief Complaint Chief Complaint  Patient presents with  . Emesis  . Nausea  . Diarrhea    HPI James Bradford is a 55 y.o. male.   Patient presents today with a 3-day history of nausea, vomiting, diarrhea.  Reports having 4-5 loose bowel movements per day without blood or mucus.  He reports associated nausea and emesis which she describes as a stomach contents are white foam.  He was having abdominal pain but this has since resolved.  He denies any fever, chest pain, shortness of breath, congestion symptoms.  Denies any known sick contacts or being around anyone who was tested positive for COVID-19.  He has not had a flu or COVID-19 vaccine.  He is open to testing today.  He has not tried any over-the-counter medications for symptom management.  He is status post appendectomy but denies additional abdominal surgeries.  He denies any recent antibiotics, recent travel, suspicious food intake, medication changes.  He is type II diabetic and believes symptoms began after he took his diabetes medicine on an empty stomach.  He has missed work as a result of symptoms and is requesting a work excuse note today.  No additional complaints or concerns today.     Past Medical History:  Diagnosis Date  . Diabetes mellitus without complication (HCC)   . Hypertension     Patient Active Problem List   Diagnosis Date Noted  . Type 2 diabetes mellitus with hyperosmolar hyperglycemic state (HHS) (HCC) 04/23/2019  . Hyperlipidemia   . Stage 3 chronic kidney disease (HCC)   . HYPERCHOLESTEROLEMIA 02/26/2009  . ERECTILE DYSFUNCTION 04/27/2008  . GLAUCOMA 02/18/2008  . Essential hypertension 02/16/2008  . DIABETES MELLITUS, TYPE II 02/03/2005    Past Surgical History:  Procedure Laterality Date  . HAND SURGERY    . LEG SURGERY         Home Medications    Prior to Admission medications    Medication Sig Start Date End Date Taking? Authorizing Provider  ondansetron (ZOFRAN ODT) 4 MG disintegrating tablet Take 1 tablet (4 mg total) by mouth every 8 (eight) hours as needed for nausea or vomiting. 05/13/20  Yes Venezia Sargeant, Noberto Retort, PA-C  Accu-Chek Softclix Lancets lancets Use as instructed 04/24/19   Derrel Nip, MD  dicyclomine (BENTYL) 20 MG tablet Take 1 tablet (20 mg total) by mouth 2 (two) times daily. 12/25/19   Moshe Cipro, NP  glipiZIDE (GLUCOTROL XL) 5 MG 24 hr tablet Take 3 tablets (15 mg total) by mouth daily with breakfast. 04/24/19   Derrel Nip, MD  glucose blood (ACCU-CHEK GUIDE) test strip Use as instructed 04/24/19   Derrel Nip, MD  KRILL OIL PO Take 1 capsule by mouth daily.    [provider]  lisinopril (ZESTRIL) 10 MG tablet Take 1 tablet (10 mg total) by mouth daily. 04/24/19   Derrel Nip, MD  metFORMIN (GLUCOPHAGE) 1000 MG tablet Take 1 tablet (1,000 mg total) by mouth 2 (two) times daily with a meal. 04/24/19   Derrel Nip, MD  Multiple Vitamin (MULTIVITAMIN ADULT PO) Take 1 tablet by mouth daily.    [provider]  pantoprazole (PROTONIX) 20 MG tablet Take 1 tablet (20 mg total) by mouth daily. 10/25/19   Merrilee Jansky, MD  pravastatin (PRAVACHOL) 10 MG tablet Take 1 tablet (10 mg total) by mouth daily. 04/24/19   Cresenzo,  Alecia Lemming, MD  sildenafil (VIAGRA) 100 MG tablet Take by mouth at bedtime. 12/21/19   [provider]    Family History History reviewed. No pertinent family history.  Social History Social History   Tobacco Use  . Smoking status: Never Smoker  . Smokeless tobacco: Never Used  Substance Use Topics  . Alcohol use: No  . Drug use: No     Allergies   Ibuprofen and Aspirin   Review of Systems Review of Systems  Constitutional: Positive for appetite change and fatigue. Negative for activity change and fever.  HENT: Negative for congestion, sinus pressure, sneezing and sore  throat.   Respiratory: Negative for cough and shortness of breath.   Cardiovascular: Negative for chest pain.  Gastrointestinal: Positive for diarrhea, nausea and vomiting. Negative for abdominal pain.  Endocrine: Negative for polydipsia, polyphagia and polyuria.  Musculoskeletal: Negative for arthralgias and myalgias.  Neurological: Negative for dizziness, light-headedness and headaches.     Physical Exam Triage Vital Signs ED Triage Vitals [05/13/20 1034]  Enc Vitals Group     BP 113/61     Pulse Rate 99     Resp 17     Temp 98.5 F (36.9 C)     Temp Source Oral     SpO2 98 %     Weight      Height      Head Circumference      Peak Flow      Pain Score 0     Pain Loc      Pain Edu?      Excl. in GC?    No data found.  Updated Vital Signs BP 113/61 (BP Location: Left Arm)   Pulse 99   Temp 98.5 F (36.9 C) (Oral)   Resp 17   SpO2 98%   Visual Acuity Right Eye Distance:   Left Eye Distance:   Bilateral Distance:    Right Eye Near:   Left Eye Near:    Bilateral Near:     Physical Exam Vitals reviewed.  Constitutional:      General: He is awake.     Appearance: Normal appearance. He is normal weight. He is not ill-appearing.     Comments: Very pleasant male appears stated age in no acute distress  HENT:     Head: Normocephalic and atraumatic.     Mouth/Throat:     Pharynx: No oropharyngeal exudate, posterior oropharyngeal erythema or uvula swelling.  Cardiovascular:     Rate and Rhythm: Normal rate and regular rhythm.     Heart sounds: No murmur heard.   Pulmonary:     Effort: Pulmonary effort is normal.     Breath sounds: Normal breath sounds. No stridor. No wheezing, rhonchi or rales.     Comments: Clear to auscultation bilaterally Abdominal:     General: Bowel sounds are normal.     Palpations: Abdomen is soft.     Tenderness: There is no abdominal tenderness.     Comments: Benign abdominal exam; nontender to palpation.  No evidence of acute  abdomen on physical exam.  Neurological:     Mental Status: He is alert.  Psychiatric:        Behavior: Behavior is cooperative.      UC Treatments / Results  Labs (all labs ordered are listed, but only abnormal results are displayed) Labs Reviewed  CBG MONITORING, ED - Abnormal; Notable for the following components:      Result Value   Glucose-Capillary 211 (*)  All other components within normal limits  POCT URINALYSIS DIPSTICK, ED / UC - Abnormal; Notable for the following components:   Glucose, UA 100 (*)    Protein, ur 30 (*)    All other components within normal limits  SARS CORONAVIRUS 2 (TAT 6-24 HRS)  CBC WITH DIFFERENTIAL/PLATELET  COMPREHENSIVE METABOLIC PANEL    EKG   Radiology No results found.  Procedures Procedures (including critical care time)  Medications Ordered in UC Medications - No data to display  Initial Impression / Assessment and Plan / UC Course  I have reviewed the triage vital signs and the nursing notes.  Pertinent labs & imaging results that were available during my care of the patient were reviewed by me and considered in my medical decision making (see chart for details).     Vitals and the physical exam reassuring today; indication for emergent evaluation or imaging.  No ketones on UA patient's blood sugar was elevated at 211 but he had not had his medication today.  He was prescribed Zofran to help with nausea and encouraged him to take medication on a full stomach.  Discussed the importance of eating a bland diet and drinking plenty of fluid.  He was provided a work excuse note as requested.  He believes that symptoms are related to stress as he recently had a case that was dismissed after having been in jail for 31 months and believes that this is contributing to symptoms.  Strict return precautions given to which patient expressed understanding.  Discussed the importance of following with PCP for ongoing management of  diabetes.  Final Clinical Impressions(s) / UC Diagnoses   Final diagnoses:  Nausea vomiting and diarrhea  Type 2 diabetes mellitus without complication, without long-term current use of insulin [facility])     Discharge Instructions     Make sure you eat something with your diabetes medication.  Please keep track of your blood sugar and follow-up with your PCP for ongoing management.  I called in a nausea medicine to help ensure you are getting enough to eat.  Please make sure you are drinking plenty of fluid including something with electrolytes.  If you have any worsening abdominal pain you need to go to the emergency room.  We will be in touch with your results soon as we have them.    ED Prescriptions    Medication Sig Dispense Auth. Provider   ondansetron (ZOFRAN ODT) 4 MG disintegrating tablet Take 1 tablet (4 mg total) by mouth every 8 (eight) hours as needed for nausea or vomiting. 20 tablet Hesston Hitchens, Noberto Retort, PA-C     PDMP not reviewed this encounter.   Jeani Hawking, PA-C 05/13/20 1143

## 2020-05-13 NOTE — Discharge Instructions (Addendum)
Make sure you eat something with your diabetes medication.  Please keep track of your blood sugar and follow-up with your PCP for ongoing management.  I called in a nausea medicine to help ensure you are getting enough to eat.  Please make sure you are drinking plenty of fluid including something with electrolytes.  If you have any worsening abdominal pain you need to go to the emergency room.  We will be in touch with your results soon as we have them.

## 2020-05-13 NOTE — ED Triage Notes (Signed)
Pt presents with N,V, and D xs 3 days.

## 2020-08-23 ENCOUNTER — Other Ambulatory Visit: Payer: Self-pay

## 2020-08-23 ENCOUNTER — Encounter (HOSPITAL_COMMUNITY): Payer: Self-pay | Admitting: Emergency Medicine

## 2020-08-23 ENCOUNTER — Observation Stay (HOSPITAL_COMMUNITY)
Admission: EM | Admit: 2020-08-23 | Discharge: 2020-08-24 | Disposition: A | Discharge status: Home / Self Care | Payer: Self-pay | Attending: Internal Medicine | Admitting: Internal Medicine

## 2020-08-23 ENCOUNTER — Emergency Department (HOSPITAL_COMMUNITY): Payer: Self-pay

## 2020-08-23 DIAGNOSIS — Z79899 Other long term (current) drug therapy: Secondary | ICD-10-CM | POA: Insufficient documentation

## 2020-08-23 DIAGNOSIS — R001 Bradycardia, unspecified: Secondary | ICD-10-CM | POA: Diagnosis present

## 2020-08-23 DIAGNOSIS — E1169 Type 2 diabetes mellitus with other specified complication: Secondary | ICD-10-CM | POA: Diagnosis present

## 2020-08-23 DIAGNOSIS — N179 Acute kidney failure, unspecified: Principal | ICD-10-CM | POA: Diagnosis present

## 2020-08-23 DIAGNOSIS — E1122 Type 2 diabetes mellitus with diabetic chronic kidney disease: Secondary | ICD-10-CM | POA: Insufficient documentation

## 2020-08-23 DIAGNOSIS — N183 Chronic kidney disease, stage 3 unspecified: Secondary | ICD-10-CM | POA: Diagnosis present

## 2020-08-23 DIAGNOSIS — I700 Atherosclerosis of aorta: Secondary | ICD-10-CM | POA: Diagnosis present

## 2020-08-23 DIAGNOSIS — D631 Anemia in chronic kidney disease: Secondary | ICD-10-CM | POA: Insufficient documentation

## 2020-08-23 DIAGNOSIS — Z20822 Contact with and (suspected) exposure to covid-19: Secondary | ICD-10-CM | POA: Insufficient documentation

## 2020-08-23 DIAGNOSIS — E119 Type 2 diabetes mellitus without complications: Secondary | ICD-10-CM

## 2020-08-23 DIAGNOSIS — G9340 Encephalopathy, unspecified: Secondary | ICD-10-CM

## 2020-08-23 DIAGNOSIS — D649 Anemia, unspecified: Secondary | ICD-10-CM | POA: Diagnosis present

## 2020-08-23 DIAGNOSIS — I2510 Atherosclerotic heart disease of native coronary artery without angina pectoris: Secondary | ICD-10-CM | POA: Insufficient documentation

## 2020-08-23 DIAGNOSIS — E1159 Type 2 diabetes mellitus with other circulatory complications: Secondary | ICD-10-CM | POA: Diagnosis present

## 2020-08-23 DIAGNOSIS — N1831 Chronic kidney disease, stage 3a: Secondary | ICD-10-CM | POA: Insufficient documentation

## 2020-08-23 DIAGNOSIS — E785 Hyperlipidemia, unspecified: Secondary | ICD-10-CM | POA: Diagnosis present

## 2020-08-23 DIAGNOSIS — I129 Hypertensive chronic kidney disease with stage 1 through stage 4 chronic kidney disease, or unspecified chronic kidney disease: Secondary | ICD-10-CM | POA: Insufficient documentation

## 2020-08-23 DIAGNOSIS — H409 Unspecified glaucoma: Secondary | ICD-10-CM | POA: Diagnosis present

## 2020-08-23 DIAGNOSIS — Z7984 Long term (current) use of oral hypoglycemic drugs: Secondary | ICD-10-CM | POA: Insufficient documentation

## 2020-08-23 DIAGNOSIS — I10 Essential (primary) hypertension: Secondary | ICD-10-CM | POA: Diagnosis present

## 2020-08-23 HISTORY — DX: Hyperlipidemia, unspecified: E78.5

## 2020-08-23 HISTORY — DX: Chronic kidney disease, stage 3 unspecified: N18.30

## 2020-08-23 HISTORY — DX: Type 2 diabetes mellitus without complications: E11.9

## 2020-08-23 LAB — CBC WITH DIFFERENTIAL/PLATELET
Abs Immature Granulocytes: 0.03 10*3/uL (ref 0.00–0.07)
Basophils Absolute: 0 10*3/uL (ref 0.0–0.1)
Basophils Relative: 0 %
Eosinophils Absolute: 0.1 10*3/uL (ref 0.0–0.5)
Eosinophils Relative: 1 %
HCT: 33.2 % — ABNORMAL LOW (ref 39.0–52.0)
Hemoglobin: 10.8 g/dL — ABNORMAL LOW (ref 13.0–17.0)
Immature Granulocytes: 0 %
Lymphocytes Relative: 18 %
Lymphs Abs: 1.4 10*3/uL (ref 0.7–4.0)
MCH: 31.8 pg (ref 26.0–34.0)
MCHC: 32.5 g/dL (ref 30.0–36.0)
MCV: 97.6 fL (ref 80.0–100.0)
Monocytes Absolute: 0.6 10*3/uL (ref 0.1–1.0)
Monocytes Relative: 8 %
Neutro Abs: 5.3 10*3/uL (ref 1.7–7.7)
Neutrophils Relative %: 73 %
Platelets: 252 10*3/uL (ref 150–400)
RBC: 3.4 MIL/uL — ABNORMAL LOW (ref 4.22–5.81)
RDW: 13.5 % (ref 11.5–15.5)
WBC: 7.3 10*3/uL (ref 4.0–10.5)
nRBC: 0 % (ref 0.0–0.2)

## 2020-08-23 LAB — URINALYSIS, ROUTINE W REFLEX MICROSCOPIC
Bacteria, UA: NONE SEEN
Bilirubin Urine: NEGATIVE
Glucose, UA: >=500 mg/dL — AB
Hgb urine dipstick: NEGATIVE
Ketones, ur: 5 mg/dL — AB
Leukocytes,Ua: NEGATIVE
Nitrite: NEGATIVE
Protein, ur: NEGATIVE mg/dL
Specific Gravity, Urine: 1.008 (ref 1.005–1.030)
pH: 6 (ref 5.0–8.0)

## 2020-08-23 LAB — COMPREHENSIVE METABOLIC PANEL
ALT: 23 U/L (ref 0–44)
AST: 25 U/L (ref 15–41)
Albumin: 4.3 g/dL (ref 3.5–5.0)
Alkaline Phosphatase: 56 U/L (ref 38–126)
Anion gap: 10 (ref 5–15)
BUN: 25 mg/dL — ABNORMAL HIGH (ref 6–20)
CO2: 23 mmol/L (ref 22–32)
Calcium: 9.4 mg/dL (ref 8.9–10.3)
Chloride: 106 mmol/L (ref 98–111)
Creatinine, Ser: 2.44 mg/dL — ABNORMAL HIGH (ref 0.61–1.24)
GFR, Estimated: 31 mL/min — ABNORMAL LOW (ref >60)
Glucose, Bld: 204 mg/dL — ABNORMAL HIGH (ref 70–99)
Potassium: 5.1 mmol/L (ref 3.5–5.1)
Sodium: 139 mmol/L (ref 135–145)
Total Bilirubin: 0.5 mg/dL (ref 0.3–1.2)
Total Protein: 7.1 g/dL (ref 6.5–8.1)

## 2020-08-23 LAB — RESP PANEL BY RT-PCR (FLU A&B, COVID) ARPGX2
Influenza A by PCR: NEGATIVE
Influenza B by PCR: NEGATIVE
SARS Coronavirus 2 by RT PCR: NEGATIVE

## 2020-08-23 LAB — CBG MONITORING, ED: Glucose-Capillary: 200 mg/dL — ABNORMAL HIGH (ref 70–99)

## 2020-08-23 LAB — I-STAT CHEM 8, ED
BUN: 28 mg/dL — ABNORMAL HIGH (ref 6–20)
Calcium, Ion: 1.18 mmol/L (ref 1.15–1.40)
Chloride: 107 mmol/L (ref 98–111)
Creatinine, Ser: 2.4 mg/dL — ABNORMAL HIGH (ref 0.61–1.24)
Glucose, Bld: 197 mg/dL — ABNORMAL HIGH (ref 70–99)
HCT: 35 % — ABNORMAL LOW (ref 39.0–52.0)
Hemoglobin: 11.9 g/dL — ABNORMAL LOW (ref 13.0–17.0)
Potassium: 4.9 mmol/L (ref 3.5–5.1)
Sodium: 140 mmol/L (ref 135–145)
TCO2: 22 mmol/L (ref 22–32)

## 2020-08-23 LAB — TROPONIN I (HIGH SENSITIVITY)
Troponin I (High Sensitivity): 3 ng/L (ref <18)
Troponin I (High Sensitivity): 3 ng/L (ref <18)

## 2020-08-23 LAB — LIPASE, BLOOD: Lipase: 41 U/L (ref 11–51)

## 2020-08-23 MED ORDER — SODIUM CHLORIDE 0.9 % IV SOLN
25.0000 mg | INTRAVENOUS | Status: DC | PRN
  Filled 2020-08-23: qty 0.5

## 2020-08-23 MED ORDER — LACTATED RINGERS IV SOLN: INTRAVENOUS | Status: DC

## 2020-08-23 MED ORDER — ACETAMINOPHEN 325 MG PO TABS: 650.0000 mg | ORAL_TABLET | Freq: Four times a day (QID) | ORAL | Status: DC | PRN

## 2020-08-23 MED ORDER — METOCLOPRAMIDE HCL 5 MG/ML IJ SOLN
20.0000 mg | Freq: Once | INTRAVENOUS | Status: DC
  Filled 2020-08-23: qty 4

## 2020-08-23 MED ORDER — ONDANSETRON HCL 4 MG/2ML IJ SOLN
4.0000 mg | Freq: Once | INTRAMUSCULAR | Status: DC
  Filled 2020-08-23: qty 2

## 2020-08-23 MED ORDER — FAMOTIDINE IN NACL 20-0.9 MG/50ML-% IV SOLN
20.0000 mg | Freq: Once | INTRAVENOUS | Status: AC
  Administered 2020-08-23: 20 mg via INTRAVENOUS
  Filled 2020-08-23: qty 50

## 2020-08-23 MED ORDER — HALOPERIDOL LACTATE 5 MG/ML IJ SOLN
2.0000 mg | Freq: Once | INTRAMUSCULAR | Status: AC
  Administered 2020-08-23: 2 mg via INTRAVENOUS
  Filled 2020-08-23: qty 1

## 2020-08-23 MED ORDER — PROCHLORPERAZINE EDISYLATE 10 MG/2ML IJ SOLN: 5.0000 mg | INTRAMUSCULAR | Status: DC | PRN

## 2020-08-23 MED ORDER — DIPHENHYDRAMINE HCL 50 MG/ML IJ SOLN
25.0000 mg | Freq: Once | INTRAMUSCULAR | Status: AC
  Administered 2020-08-23: 25 mg via INTRAVENOUS
  Filled 2020-08-23: qty 1

## 2020-08-23 MED ORDER — LACTATED RINGERS IV BOLUS
2000.0000 mL | Freq: Once | INTRAVENOUS | Status: AC
  Administered 2020-08-23: 2000 mL via INTRAVENOUS

## 2020-08-23 MED ORDER — LACTATED RINGERS IV BOLUS
1000.0000 mL | Freq: Once | INTRAVENOUS | Status: AC
  Administered 2020-08-23: 1000 mL via INTRAVENOUS

## 2020-08-23 MED ORDER — ACETAMINOPHEN 650 MG RE SUPP: 650.0000 mg | Freq: Four times a day (QID) | RECTAL | Status: DC | PRN

## 2020-08-23 MED ORDER — METOCLOPRAMIDE HCL 5 MG/ML IJ SOLN
10.0000 mg | Freq: Once | INTRAMUSCULAR | Status: AC
  Administered 2020-08-23: 10 mg via INTRAVENOUS
  Filled 2020-08-23: qty 2

## 2020-08-23 NOTE — ED Notes (Signed)
Pt transported to CT ?

## 2020-08-23 NOTE — ED Notes (Signed)
Pt made aware of admitting MD orders of tele monitoring, pt still refuses to want to wear the monitoring.

## 2020-08-23 NOTE — ED Triage Notes (Signed)
Pt BIB [facility] from work today with sudden onset of n/v while at work. Pt became diaphoretic as well. Pt noted to be in sinus bradycardia with rates in the 40s-50s. No significant cardiac hx. Pt only took his prescribed metformin today. Pt given 4 mg IV zofran along with 500 NS bolus by EMS. Pt BP: 150/80, 154 CBG, Spo2-100%.

## 2020-08-23 NOTE — ED Provider Notes (Signed)
[facility] EMERGENCY DEPARTMENT Provider Note   CSN: [id number] Arrival date & time: 08/23/20  1532     History Chief Complaint  Patient presents with   Nausea   Emesis   Bradycardia    James Bradford is a 55 y.o. male.   Illness Severity:  Moderate Onset quality:  Sudden Duration:  3 hours Timing:  Constant Progression:  Unchanged Chronicity:  Recurrent Associated symptoms: abdominal pain, nausea and vomiting   Associated symptoms: no cough, no fever, no shortness of breath and no sore throat    55 year old male PMHx T2DM, HTN, HLD, CKD, glaucoma, presenting for NBNB N/V x3 to 4 hours.  Patient states that he took his morning meds (metformin, glipizide, dicyclomine, pravastatin, lisinopril) a couple hours prior to onset of symptoms.  Associated symptoms include mild nonradiating epigastric pain, chills, diaphoresis, blurred vision (from tearing with vomiting episodes).  He has had similar symptoms before with some improvement with antiemetic administration.   No further medical concerns this time including fevers, sore throat, rhinorrhea, sneezing, cough, shortness of breath, chest pain, palpitations, pedal edema, abdominal distention, bowel/bladder changes, focal weakness/paresthesia, headache.  History obtained from patient and chart review.    Past Medical History:  Diagnosis Date   Aortic atherosclerosis (HCC) 08/24/2020   Hyperlipidemia    Hypertension    Stage 3 chronic kidney disease (HCC)    Type 2 diabetes mellitus (HCC)    Unspecified glaucoma 02/18/2008   Qualifier: Diagnosis of  By: Daphine Deutscher FNP, Zena Amos      Patient Active Problem List   Diagnosis Date Noted   Sinus bradycardia 08/24/2020   Aortic atherosclerosis (HCC) 08/24/2020   AKI (acute kidney injury) (HCC) 08/23/2020   Type 2 diabetes mellitus (HCC) 08/23/2020   Normocytic anemia 08/23/2020   Type 2 diabetes mellitus with hyperosmolar hyperglycemic state (HHS) (HCC) 04/23/2019    Hyperlipidemia    Stage 3 chronic kidney disease (HCC)    HYPERCHOLESTEROLEMIA 02/26/2009   ERECTILE DYSFUNCTION 04/27/2008   Unspecified glaucoma 02/18/2008   Essential hypertension 02/16/2008   DIABETES MELLITUS, TYPE II 02/03/2005    Past Surgical History:  Procedure Laterality Date   HAND SURGERY     LEG SURGERY         Family History  Problem Relation Age of Onset   Hypertension Mother    Pancreatic cancer Father     Social History   Tobacco Use   Smoking status: Never   Smokeless tobacco: Never  Substance Use Topics   Alcohol use: No   Drug use: No    Home Medications Prior to Admission medications   Medication Sig Start Date End Date Taking? Authorizing Provider  Accu-Chek Softclix Lancets lancets Use as instructed 04/24/19  Yes Derrel Nip, MD  dicyclomine (BENTYL) 20 MG tablet Take 1 tablet (20 mg total) by mouth 2 (two) times daily. 12/25/19  Yes Moshe Cipro, NP  glipiZIDE (GLUCOTROL) 10 MG tablet Take 10 mg by mouth 2 (two) times daily. 08/03/20  Yes [provider]  glucose blood (ACCU-CHEK GUIDE) test strip Use as instructed 04/24/19  Yes Derrel Nip, MD  KRILL OIL PO Take 1 capsule by mouth daily.   Yes [provider]  lisinopril (ZESTRIL) 10 MG tablet Take 1 tablet (10 mg total) by mouth daily. 04/24/19  Yes Derrel Nip, MD  metFORMIN (GLUCOPHAGE) 1000 MG tablet Take 1 tablet (1,000 mg total) by mouth 2 (two) times daily with a meal. 04/24/19  Yes Derrel Nip, MD  Multiple Vitamin (  MULTIVITAMIN ADULT PO) Take 1 tablet by mouth daily.   Yes [provider]  pravastatin (PRAVACHOL) 10 MG tablet Take 1 tablet (10 mg total) by mouth daily. 04/24/19  Yes Derrel Nip, MD  sildenafil (VIAGRA) 100 MG tablet Take 100 mg by mouth daily as needed for erectile dysfunction. 12/21/19  Yes [provider]  ondansetron (ZOFRAN ODT) 4 MG disintegrating tablet Take 1 tablet (4 mg total) by mouth every 8  (eight) hours as needed for nausea or vomiting. Patient not taking: Reported on 08/23/2020 05/13/20   Raspet, Denny Peon K, PA-C  pantoprazole (PROTONIX) 20 MG tablet Take 1 tablet (20 mg total) by mouth daily. Patient not taking: Reported on 08/23/2020 10/25/19   Merrilee Jansky, MD    Allergies    Ibuprofen and Aspirin  Review of Systems   Review of Systems  Constitutional:  Negative for fever.  HENT:  Negative for sore throat.   Respiratory:  Negative for cough and shortness of breath.   Gastrointestinal:  Positive for abdominal pain, nausea and vomiting.  All other systems reviewed and are negative.  Physical Exam Updated Vital Signs BP (!) 153/72 (BP Location: Left Arm)   Pulse (!) 58   Temp 98.2 F (36.8 C) (Oral)   Resp 16   Ht 5\' 7"  (1.702 m)   Wt 71.5 kg   SpO2 100%   BMI 24.68 kg/m   Physical Exam Vitals and nursing note reviewed.  Constitutional:      Appearance: He is diaphoretic.  HENT:     Head: Normocephalic and atraumatic.     Nose: Rhinorrhea present. No congestion.     Mouth/Throat:     Mouth: Mucous membranes are moist.     Pharynx: Oropharynx is clear. Posterior oropharyngeal erythema present.  Eyes:     Extraocular Movements: Extraocular movements intact.     Conjunctiva/sclera: Conjunctivae normal.     Pupils: Pupils are equal, round, and reactive to light.  Cardiovascular:     Rate and Rhythm: Regular rhythm. Bradycardia present.     Heart sounds: No murmur heard.   No friction rub. No gallop.  Pulmonary:     Effort: Pulmonary effort is normal.     Breath sounds: No stridor. No wheezing, rhonchi or rales.  Abdominal:     General: There is no distension.     Palpations: Abdomen is soft.     Tenderness: There is abdominal tenderness (mild epigastric). There is no guarding or rebound.  Musculoskeletal:     Cervical back: Normal range of motion. No rigidity.     Right lower leg: No edema.     Left lower leg: No edema.  Skin:    General: Skin is  warm.  Neurological:     Mental Status: He is alert and oriented to person, place, and time. Mental status is at baseline.    ED Results / Procedures / Treatments   Labs (all labs ordered are listed, but only abnormal results are displayed) Labs Reviewed  CBC WITH DIFFERENTIAL/PLATELET - Abnormal; Notable for the following components:      Result Value   RBC 3.40 (*)    Hemoglobin 10.8 (*)    HCT 33.2 (*)    All other components within normal limits  COMPREHENSIVE METABOLIC PANEL - Abnormal; Notable for the following components:   Glucose, Bld 204 (*)    BUN 25 (*)    Creatinine, Ser 2.44 (*)    GFR, Estimated 31 (*)    All  other components within normal limits  URINALYSIS, ROUTINE W REFLEX MICROSCOPIC - Abnormal; Notable for the following components:   Color, Urine STRAW (*)    Glucose, UA >=500 (*)    Ketones, ur 5 (*)    All other components within normal limits  GLUCOSE, CAPILLARY - Abnormal; Notable for the following components:   Glucose-Capillary 244 (*)    All other components within normal limits  CBG MONITORING, ED - Abnormal; Notable for the following components:   Glucose-Capillary 200 (*)    All other components within normal limits  I-STAT CHEM 8, ED - Abnormal; Notable for the following components:   BUN 28 (*)    Creatinine, Ser 2.40 (*)    Glucose, Bld 197 (*)    Hemoglobin 11.9 (*)    HCT 35.0 (*)    All other components within normal limits  RESP PANEL BY RT-PCR (FLU A&B, COVID) ARPGX2  LIPASE, BLOOD  HIV ANTIBODY (ROUTINE TESTING W REFLEX)  HEMOGLOBIN  BASIC METABOLIC PANEL  TROPONIN I (HIGH SENSITIVITY)  TROPONIN I (HIGH SENSITIVITY)    EKG EKG Interpretation  Date/Time:  Thursday August 23 2020 15:41:16 EDT Ventricular Rate:  46 PR Interval:  157 QRS Duration: 90 QT Interval:  468 QTC Calculation: 410 R Axis:   11 Text Interpretation: Sinus bradycardia Confirmed by Marianna Fussykstra, Richard ([id number]) on 08/23/2020 5:29:22 PM  Radiology CT ABDOMEN  PELVIS WO CONTRAST  Result Date: 08/23/2020 CLINICAL DATA:  Epigastric pain. EXAM: CT ABDOMEN AND PELVIS WITHOUT CONTRAST TECHNIQUE: Multidetector CT imaging of the abdomen and pelvis was performed following the standard protocol without IV contrast. COMPARISON:  Contrast-enhanced CT 01/31/2015 FINDINGS: Lower chest: The lung bases are clear. No pleural effusion or acute airspace disease. Hepatobiliary: No evidence of focal hepatic abnormality on this unenhanced exam. Gallbladder physiologically distended, no calcified stone. No biliary dilatation. Pancreas: No ductal dilatation or inflammation. Spleen: Normal in size without focal abnormality. Adrenals/Urinary Tract: Normal adrenal glands. No hydronephrosis. No renal or ureteral calculi. There is minimal right perinephric edema bilateral low-density lesions typically cysts, incompletely characterized on this unenhanced exam. 2.1 cm cyst in the posterior mid kidney not significantly changed from prior. Minimally distended urinary bladder. Stomach/Bowel: Decompressed stomach. No small bowel obstruction or inflammation. The appendix is not confidently visualized on the current exam. No appendicitis. Small volume of colonic stool without colonic wall thickening there is moderate stool in the rectum. Vascular/Lymphatic: Mild aorto bi-iliac atherosclerosis. No aortic aneurysm no enlarged lymph nodes in the abdomen or pelvis. Reproductive: Prostate is unremarkable. Seminal vesicle calcifications typical of diabetes. Other: No free air, free fluid, or intra-abdominal fluid collection. Musculoskeletal: There are no acute or suspicious osseous abnormalities. IMPRESSION: 1. Faint right perinephric edema. This may be chronic or can be seen in the setting of urinary tract infection. Recommend correlation with urinalysis. 2. No other acute findings in the abdomen/pelvis. Aortic Atherosclerosis (ICD10-I70.0). Electronically Signed   By: Narda RutherfordMelanie  Sanford M.D.   On: 08/23/2020  17:58   DG Chest Portable 1 View  Result Date: 08/23/2020 CLINICAL DATA:  Nausea and vomiting, hypertension, diabetes mellitus EXAM: PORTABLE CHEST 1 VIEW COMPARISON:  Portable exam 1613 hours without priors for comparison FINDINGS: Normal heart size, mediastinal contours, and pulmonary vascularity. Lungs clear. No infiltrate, pleural effusion, or pneumothorax. Osseous structures unremarkable. IMPRESSION: No acute abnormalities. Electronically Signed   By: Ulyses SouthwardMark  Boles M.D.   On: 08/23/2020 16:37    Procedures Procedures   Medications Ordered in ED Medications  lactated ringers infusion ( Intravenous  New Bag/Given 08/24/20 0059)  acetaminophen (TYLENOL) tablet 650 mg (has no administration in time range)    Or  acetaminophen (TYLENOL) suppository 650 mg (has no administration in time range)  prochlorperazine (COMPAZINE) injection 5 mg (has no administration in time range)  HYDROmorphone (DILAUDID) injection 1 mg (has no administration in time range)  pantoprazole (PROTONIX) EC tablet 40 mg (has no administration in time range)  lactated ringers bolus 1,000 mL (0 mLs Intravenous Stopped 08/23/20 2004)  metoCLOPramide (REGLAN) injection 10 mg (10 mg Intravenous Given 08/23/20 1650)  diphenhydrAMINE (BENADRYL) injection 25 mg (25 mg Intravenous Given 08/23/20 1649)  haloperidol lactate (HALDOL) injection 2 mg (2 mg Intravenous Given 08/23/20 1931)  lactated ringers bolus 2,000 mL (2,000 mLs Intravenous New Bag/Given 08/23/20 2030)  famotidine (PEPCID) IVPB 20 mg premix (0 mg Intravenous Stopped 08/23/20 2138)  diphenhydrAMINE (BENADRYL) injection 25 mg (25 mg Intravenous Given 08/24/20 0059)    ED Course  I have reviewed the triage vital signs and the nursing notes.  Pertinent labs & imaging results that were available during my care of the patient were reviewed by me and considered in my medical decision making (see chart for details).    MDM Rules/Calculators/A&P                           This is a 56 year old male PMHx T2DM, HTN, HLD, CKD III, glaucoma, presenting for NBNB N/V x3 to 4 hours after taking morning meds including metformin, glipizide, pravastatin, dicyclomine, lisinopril.  On exam, VSS, AF, intermittently bradycardic down to the 40s, and dry heaving.  Mild epigastric tenderness to palpation.  Initial interventions: No significant relief with Zofran followed by Reglan + Benadryl; ultimately improved with Haldol 2 mg  All studies independently reviewed by myself, d/w the attending physician, factored into my MDM. -EKG: Sinus bradycardia 46 bpm, normal axis, normal intervals, no acute ST/T changes; bradycardia new and prior ST elevation consistent with repolarization changes noted from 04/2019 resolved -CT AP noncontrast: Faint right perinephric edema possibly suggestive of UTI though may be chronic -CMP: Creatinine 2.44 (prior 1.543 months ago) -UA: Glucosuria >500 -Unremarkable: CXR, CBCd, troponin x2, COVID/influenza, lipase  Presentation most consistent with N/V (uncertain etiology, could be medication side effect, gastritis, marijuana use), AKI, bradycardia.  Benign serial abdominal examinations, no leukocytosis, normal lipase, no testicular pain.  UA not infected appearing, no urinary symptoms.  Suspect AKI likely 2/2 dehydration exacerbating underlying CKD.  No red flag EKGs findings suggestive of inflammation, ischemia, or heart block.  Delta troponin negative.  No localizing lung sounds or CXR findings to suggest pneumonia, PTX, effusion, pneumoperitoneum.  Feel patient requires admission for further work-up of MVM bradycardia, as well as rehydration for his AKI.  Discussed with hospital service who agreed to admit.  Patient understands agrees with plan.  Patient HDS on reevaluation with improvement of his N/V after receiving Haldol.  Course current plan discussed with admitting service, to patient care was transferred.  Final Clinical Impression(s) / ED  Diagnoses Final diagnoses:  None    Rx / DC Orders ED Discharge Orders     None        Colvin Caroli, MD 08/24/20 1610    Milagros Loll, MD 08/25/20 2023

## 2020-08-23 NOTE — ED Notes (Signed)
Pt requesting to be removed from tele monitoring because he "feels restricted." EDP okay with pt being removed

## 2020-08-23 NOTE — ED Notes (Signed)
Called to give report, RN to call back.

## 2020-08-23 NOTE — H&P (Signed)
History and Physical    James Bradford SVX:[id number] DOB: 27-Jan-1966 DOA: 08/23/2020  PCP: [facility], [facility]   Patient coming from: Home.   I have personally briefly reviewed patient's old medical records in [facility]  Chief Complaint: Abdominal pain, nausea and vomiting.  HPI: James Bradford is a 55 y.o. male with medical history significant of hypertension, stage IIIa CKD, chronic normocytic anemia, type II DM, hyperlipidemia who is coming to the emergency department from work after developing intense abdominal pain followed by over 20 episodes of emesis-subsequently became persistent dry heaving.  The patient mentioned that he had breakfast around 830 and around 1030 to 1100 he took his vitamins and developed gastrointestinal upset.  He has had similar reactions before but not as intense.  He had 2 normal bowel movements after coming to the hospital.  He denied fever, but had chills and lightheadedness earlier.  No rhinorrhea, sore throat, wheezing or hemoptysis.  No dyspnea, chest pain, palpitations, diaphoresis, PND, orthopnea or recent pitting edema of the lower extremities.  He stated that he occasionally develops lower extremity edema, but he has been a while.  No dysuria, flank pain, frequency or hematuria.  No polyuria, polydipsia, polyphagia or blurred vision.  ED Course: Initial vital signs were temperature 97.6 F, pulse 54, respiration 15, BP 159/86 mmHg and O2 sat 100% on room air.  The patient received 1000 mL of LR bolus, metoclopramide 10 mg IVP, diphenhydramine 25 mg IV and haloperidol 2 mg IVP.  I added another 2000 mL of LR bolus, famotidine 20 mg IVP and a second dose of 25 mg of diphenhydramine IVP asked the patient was still mildly nauseous and requesting something to help him sleep.  Lab work: His urinalysis showed glucosuria more than 500 and ketonuria 5 mg/dL, the rest of the measurements were unremarkable.  CBC showed a white count of 7.3,  hemoglobin 10.8 g/dL platelets 233.  Troponin x2 was 3 ng/L.  Lipase was 45 U/L.  LFTs and electrolytes were normal.  Glucose was 204, BUN 25 and creatinine 2.44 mg/dL.  Baseline hemoglobin in the last 16 months has ranged from 11.5 to 11.9 g/dL.  His creatinine has ranged from 1.43 to 1.62 mg/dL in the same timeframe.  Imaging: A one-view portable chest radiograph did not show any acute abnormalities.  CT abdomen pelvis without contrast did not have any acute findings, but showed faint right perinephric edema which could be chronic.  There was also aortic atherosclerosis.  Please see images and full daily report for further detail.  Review of Systems: As per HPI otherwise all other systems reviewed and are negative.  Past Medical History:  Diagnosis Date   Hypertension    Type 2 diabetes mellitus (HCC)    Past Surgical History:  Procedure Laterality Date   HAND SURGERY     LEG SURGERY     Social History  reports that he has never smoked. He has never used smokeless tobacco. He reports that he does not drink alcohol and does not use drugs.  Allergies  Allergen Reactions   Ibuprofen Other (See Comments)    Hurts his stomach    Aspirin Other (See Comments)    Stomach irritation   Family History  Problem Relation Age of Onset   Hypertension Mother    Pancreatic cancer Father    Prior to Admission medications   Medication Sig Start Date End Date Taking? Authorizing Provider  Accu-Chek Softclix Lancets lancets Use as instructed 04/24/19  Yes Derrel Nip, MD  dicyclomine (BENTYL) 20 MG tablet Take 1 tablet (20 mg total) by mouth 2 (two) times daily. 12/25/19  Yes Moshe Cipro, NP  glipiZIDE (GLUCOTROL) 10 MG tablet Take 10 mg by mouth 2 (two) times daily. 08/03/20  Yes [provider]  glucose blood (ACCU-CHEK GUIDE) test strip Use as instructed 04/24/19  Yes Derrel Nip, MD  KRILL OIL PO Take 1 capsule by mouth daily.   Yes [provider]  lisinopril  (ZESTRIL) 10 MG tablet Take 1 tablet (10 mg total) by mouth daily. 04/24/19  Yes Derrel Nip, MD  metFORMIN (GLUCOPHAGE) 1000 MG tablet Take 1 tablet (1,000 mg total) by mouth 2 (two) times daily with a meal. 04/24/19  Yes Derrel Nip, MD  Multiple Vitamin (MULTIVITAMIN ADULT PO) Take 1 tablet by mouth daily.   Yes [provider]  pravastatin (PRAVACHOL) 10 MG tablet Take 1 tablet (10 mg total) by mouth daily. 04/24/19  Yes Derrel Nip, MD  sildenafil (VIAGRA) 100 MG tablet Take 100 mg by mouth daily as needed for erectile dysfunction. 12/21/19  Yes [provider]  ondansetron (ZOFRAN ODT) 4 MG disintegrating tablet Take 1 tablet (4 mg total) by mouth every 8 (eight) hours as needed for nausea or vomiting. Patient not taking: Reported on 08/23/2020 05/13/20   Raspet, Denny Peon K, PA-C  pantoprazole (PROTONIX) 20 MG tablet Take 1 tablet (20 mg total) by mouth daily. Patient not taking: Reported on 08/23/2020 10/25/19   Merrilee Jansky, MD    Physical Exam: Vitals:   08/23/20 1915 08/23/20 1934 08/23/20 1945 08/23/20 2320  BP:  136/89 132/82 128/73  Pulse:  (!) 55  (!) 56  Resp: 16 15  18   Temp:    98.1 F (36.7 C)  TempSrc:    Oral  SpO2:  100%  100%  Weight:      Height:        Constitutional: NAD, calm, comfortable Eyes: PERRL, lids and conjunctivae normal ENMT: Mucous membranes and lips are mildly dry.  Posterior pharynx clear of any exudate or lesions. Neck: normal, supple, no masses, no thyromegaly Respiratory: clear to auscultation bilaterally, no wheezing, no crackles. Normal respiratory effort. No accessory muscle use.  Cardiovascular: Bradycardic in the low to mid 50s, no murmurs / rubs / gallops. No extremity edema. 2+ pedal pulses. No carotid bruits.  Abdomen: No distention.  Bowel sounds positive.  Soft, mild epigastric tenderness without guarding or rebound, no masses palpated. No hepatosplenomegaly. Musculoskeletal: no clubbing / cyanosis.  Good  ROM, no contractures. Normal muscle tone.  Skin: no rashes, lesions, ulcers on very limited dermatological examination. Neurologic: CN 2-12 grossly intact. Sensation intact, DTR normal. Strength 5/5 in all 4.  Psychiatric: Normal judgment and insight. Alert and oriented x 3. Normal mood.  Labs on Admission: I have personally reviewed following labs and imaging studies  CBC: Recent Labs  Lab 08/23/20 1548 08/23/20 1650  WBC 7.3  --   NEUTROABS 5.3  --   HGB 10.8* 11.9*  HCT 33.2* 35.0*  MCV 97.6  --   PLT 252  --     Basic Metabolic Panel: Recent Labs  Lab 08/23/20 1548 08/23/20 1650  NA 139 140  K 5.1 4.9  CL 106 107  CO2 23  --   GLUCOSE 204* 197*  BUN 25* 28*  CREATININE 2.44* 2.40*  CALCIUM 9.4  --     GFR: Estimated Creatinine Clearance: 33.3 mL/min (A) (by C-G formula based on SCr  of 2.4 mg/dL (H)).  Liver Function Tests: Recent Labs  Lab 08/23/20 1548  AST 25  ALT 23  ALKPHOS 56  BILITOT 0.5  PROT 7.1  ALBUMIN 4.3    Urine analysis:    Component Value Date/Time   COLORURINE STRAW (A) 08/23/2020 1603   APPEARANCEUR CLEAR 08/23/2020 1603   LABSPEC 1.008 08/23/2020 1603   PHURINE 6.0 08/23/2020 1603   GLUCOSEU >=500 (A) 08/23/2020 1603   HGBUR NEGATIVE 08/23/2020 1603        BILIRUBINUR NEGATIVE 08/23/2020 1603   KETONESUR 5 (A) 08/23/2020 1603   PROTEINUR NEGATIVE 08/23/2020 1603   UROBILINOGEN 0.2 05/13/2020 1050   NITRITE NEGATIVE 08/23/2020 1603   LEUKOCYTESUR NEGATIVE 08/23/2020 1603    Radiological Exams on Admission: CT ABDOMEN PELVIS WO CONTRAST  Result Date: 08/23/2020 CLINICAL DATA:  Epigastric pain. EXAM: CT ABDOMEN AND PELVIS WITHOUT CONTRAST TECHNIQUE: Multidetector CT imaging of the abdomen and pelvis was performed following the standard protocol without IV contrast. COMPARISON:  Contrast-enhanced CT 01/31/2015 FINDINGS: Lower chest: The lung bases are clear. No pleural effusion or acute airspace disease. Hepatobiliary: No  evidence of focal hepatic abnormality on this unenhanced exam. Gallbladder physiologically distended, no calcified stone. No biliary dilatation. Pancreas: No ductal dilatation or inflammation. Spleen: Normal in size without focal abnormality. Adrenals/Urinary Tract: Normal adrenal glands. No hydronephrosis. No renal or ureteral calculi. There is minimal right perinephric edema bilateral low-density lesions typically cysts, incompletely characterized on this unenhanced exam. 2.1 cm cyst in the posterior mid kidney not significantly changed from prior. Minimally distended urinary bladder. Stomach/Bowel: Decompressed stomach. No small bowel obstruction or inflammation. The appendix is not confidently visualized on the current exam. No appendicitis. Small volume of colonic stool without colonic wall thickening there is moderate stool in the rectum. Vascular/Lymphatic: Mild aorto bi-iliac atherosclerosis. No aortic aneurysm no enlarged lymph nodes in the abdomen or pelvis. Reproductive: Prostate is unremarkable. Seminal vesicle calcifications typical of diabetes. Other: No free air, free fluid, or intra-abdominal fluid collection. Musculoskeletal: There are no acute or suspicious osseous abnormalities. IMPRESSION: 1. Faint right perinephric edema. This may be chronic or can be seen in the setting of urinary tract infection. Recommend correlation with urinalysis. 2. No other acute findings in the abdomen/pelvis. Aortic Atherosclerosis (ICD10-I70.0). Electronically Signed   By: Narda Rutherford M.D.   On: 08/23/2020 17:58   DG Chest Portable 1 View  Result Date: 08/23/2020 CLINICAL DATA:  Nausea and vomiting, hypertension, diabetes mellitus EXAM: PORTABLE CHEST 1 VIEW COMPARISON:  Portable exam 1613 hours without priors for comparison FINDINGS: Normal heart size, mediastinal contours, and pulmonary vascularity. Lungs clear. No infiltrate, pleural effusion, or pneumothorax. Osseous structures unremarkable. IMPRESSION:  No acute abnormalities. Electronically Signed   By: Ulyses Southward M.D.   On: 08/23/2020 16:37    EKG: Independently reviewed. Vent. rate 46 BPM PR interval 157 ms QRS duration 90 ms QT/QTcB 468/410 ms P-R-T axes 41 11 26 Sinus bradycardia  Assessment/Plan Principal Problem:   AKI (acute kidney injury) (HCC) superimposed on   Stage 3 chronic kidney disease (HCC) Observation/telemetry. Keep n.p.o. overnight. Continue IV fluids. Monitor intake and output. Hold lisinopril and metformin. Follow-up renal function electrolytes in AM.  Active Problems:   Sinus bradycardia Vasovagal? Continue cardiac telemetry.    Unspecified glaucoma Currently not on therapy. Follow-up with PCP and ophthalmology.    Essential hypertension Hold lisinopril in the setting of AKI. Monitor BP, renal function electrolytes.    Hyperlipidemia/aortic atherosclerosis. Resume pravastatin 10 mg p.o. daily.  Type 2 diabetes mellitus (HCC) Currently NPO. If tolerating diet in a.m. Resume glipizide 10 mg p.o. twice daily. Continue IV fluids. CBG monitoring with RI SS.    Normocytic anemia Early renal disease? Consider anemia work-up. Follow-up with PCP.    DVT prophylaxis: SCDs. Code Status:   Full code. Family Communication:   Disposition Plan:   Patient is from:  Home.  Anticipated DC to:  Home.  Anticipated DC date:  08/24/2020.  Anticipated DC barriers: Clinical status.  Consults called:   Admission status:  Observation/telemetry.   Severity of Illness: High severity in the setting of acute renal failure on chronic stage III CKD in the setting of dehydration secondary to multiple episodes of emesis and persistent dry heaving.  The patient will need to remain in the hospital for IV hydration, symptoms management and renal function follow-up.  Bobette Moavid Manuel Marissah Vandemark MD [facility]  How to contact the [facility] Attending or Consulting provider 7A - 7P or covering provider during after hours  7P -7A, for this patient?   Check the care team in [facility] and look for a) attending/consulting [facility] provider listed and b) the [facility] team listed Log into [email or web address] and use [facility]'s universal password to access. If you do not have the password, please contact the hospital operator. Locate the [facility] provider you are looking for under [facility] and page to a number that you can be directly reached. If you still have difficulty reaching the provider, please page the [facility] (Director on Call) for the Hospitalists listed on amion for assistance.  08/23/2020, 11:31 PM   This document was prepared using Dragon voice recognition software and may contain some unintended transcription errors.

## 2020-08-23 NOTE — ED Notes (Signed)
Pt has not had any additional episodes of vomiting. Continues to state he feels nauseas. Given IV haldol at 1931.

## 2020-08-24 ENCOUNTER — Encounter (HOSPITAL_COMMUNITY): Payer: Self-pay | Admitting: Internal Medicine

## 2020-08-24 DIAGNOSIS — I700 Atherosclerosis of aorta: Secondary | ICD-10-CM

## 2020-08-24 DIAGNOSIS — R001 Bradycardia, unspecified: Secondary | ICD-10-CM | POA: Diagnosis present

## 2020-08-24 DIAGNOSIS — N179 Acute kidney failure, unspecified: Secondary | ICD-10-CM

## 2020-08-24 HISTORY — DX: Atherosclerosis of aorta: I70.0

## 2020-08-24 LAB — HIV ANTIBODY (ROUTINE TESTING W REFLEX): HIV Screen 4th Generation wRfx: NONREACTIVE

## 2020-08-24 LAB — GLUCOSE, CAPILLARY
Glucose-Capillary: 193 mg/dL — ABNORMAL HIGH (ref 70–99)
Glucose-Capillary: 197 mg/dL — ABNORMAL HIGH (ref 70–99)
Glucose-Capillary: 244 mg/dL — ABNORMAL HIGH (ref 70–99)

## 2020-08-24 LAB — HEMOGLOBIN: Hemoglobin: 10.8 g/dL — ABNORMAL LOW (ref 13.0–17.0)

## 2020-08-24 LAB — BASIC METABOLIC PANEL
Anion gap: 9 (ref 5–15)
BUN: 24 mg/dL — ABNORMAL HIGH (ref 6–20)
CO2: 24 mmol/L (ref 22–32)
Calcium: 9.2 mg/dL (ref 8.9–10.3)
Chloride: 104 mmol/L (ref 98–111)
Creatinine, Ser: 1.97 mg/dL — ABNORMAL HIGH (ref 0.61–1.24)
GFR, Estimated: 40 mL/min — ABNORMAL LOW (ref >60)
Glucose, Bld: 202 mg/dL — ABNORMAL HIGH (ref 70–99)
Potassium: 4.5 mmol/L (ref 3.5–5.1)
Sodium: 137 mmol/L (ref 135–145)

## 2020-08-24 MED ORDER — PANTOPRAZOLE SODIUM 40 MG PO TBEC
40.0000 mg | DELAYED_RELEASE_TABLET | Freq: Every day | ORAL | Status: DC
  Administered 2020-08-24: 40 mg via ORAL
  Filled 2020-08-24: qty 1

## 2020-08-24 MED ORDER — DIPHENHYDRAMINE HCL 50 MG/ML IJ SOLN
25.0000 mg | Freq: Once | INTRAMUSCULAR | Status: AC
  Administered 2020-08-24: 25 mg via INTRAVENOUS
  Filled 2020-08-24: qty 1

## 2020-08-24 MED ORDER — HYDROMORPHONE HCL 1 MG/ML IJ SOLN: 1.0000 mg | INTRAMUSCULAR | Status: DC | PRN

## 2020-08-24 NOTE — Progress Notes (Signed)
Patient has ordered for discharge. Given discharge instructions with paper to the patient. Iv removed. Given all belongings to the patient. 

## 2020-08-24 NOTE — Discharge Summary (Signed)
Physician Discharge Summary  James HillDwayne Bradford ZOX:[id number] DOB: [birth year] DOA: 08/23/2020  PCP: [facility]  Admit date: 08/23/2020 Discharge date: 08/24/2020  Admitted From: home Disposition:  home  Recommendations for Outpatient Follow-up:  Follow up with PCP in 1-2 weeks Please obtain BMP/CBC in one week  Home Health: none Equipment/Devices: none  Discharge Condition: stable CODE STATUS: Full code Diet recommendation: diabetic  HPI: Per admitting MD, James HillDwayne Gohlke is a 55 y.o. male with medical history significant of hypertension, stage IIIa CKD, chronic normocytic anemia, type II DM, hyperlipidemia who is coming to the emergency department from work after developing intense abdominal pain followed by over 20 episodes of emesis-subsequently became persistent dry heaving.  The patient mentioned that he had breakfast around 830 and around 1030 to 1100 he took his vitamins and developed gastrointestinal upset.  He has had similar reactions before but not as intense.  He had 2 normal bowel movements after coming to the hospital.  He denied fever, but had chills and lightheadedness earlier.  No rhinorrhea, sore throat, wheezing or hemoptysis.  No dyspnea, chest pain, palpitations, diaphoresis, PND, orthopnea or recent pitting edema of the lower extremities.  He stated that he occasionally develops lower extremity edema, but he has been a while.  No dysuria, flank pain, frequency or hematuria.  No polyuria, polydipsia, polyphagia or blurred vision.  Hospital Course / Discharge diagnoses: Principal problem Abdominal pain, nausea vomiting-patient had an episode of severe abdominal pain, nausea and vomiting after ingesting multivitamins, krill oil at the same time.  He reports that he normally takes them 1 at a time but this time he took it along with other pills.  This is happened to him before with severe abdominal pain, nausea vomiting but usually goes away, unlike  this time it was prolonged and was worse than his prior episodes and decided to come to the hospital.  He was placed on IV fluids, n.p.o., antiemetics and by the next hospital day it resolved completely.  He was able to tolerate a regular diet and will be discharged home in stable condition  Active problems Acute kidney injury on chronic kidney disease stage IIIb-Baseline creatinine around 1.5, it was as high as 2.4 on admission and improving to 1.9 with IV fluids.  As he is able to tolerate p.o. I anticipate his creatinine will return to his baseline.  Advised to follow-up with PCP for repeat labs in a week Sinus bradycardia-asymptomatic, heart rate in the 60s Essential hypertension-resume home medications Type 2 diabetes mellitus-A1c 16 last year, currently pending but CBGs have been in the upper 100s.  Resume home medications, follow-up with PCP Hyperlipidemia-continue statin Normocytic anemia-likely due to renal disease  Sepsis ruled out  Discharge Instructions   Allergies as of 08/24/2020       Reactions   Ibuprofen Other (See Comments)   Hurts his stomach   Aspirin Other (See Comments)   Stomach irritation        Medication List     STOP taking these medications    pantoprazole 20 MG tablet Commonly known as: PROTONIX       TAKE these medications    Accu-Chek Guide test strip Generic drug: glucose blood Use as instructed   Accu-Chek Softclix Lancets lancets Use as instructed   dicyclomine 20 MG tablet Commonly known as: BENTYL Take 1 tablet (20 mg total) by mouth 2 (two) times daily.   glipiZIDE 10 MG tablet Commonly known as: GLUCOTROL Take 10 mg by  mouth 2 (two) times daily.   KRILL OIL PO Take 1 capsule by mouth daily.   lisinopril 10 MG tablet Commonly known as: ZESTRIL Take 1 tablet (10 mg total) by mouth daily.   metFORMIN 1000 MG tablet Commonly known as: GLUCOPHAGE Take 1 tablet (1,000 mg total) by mouth 2 (two) times daily with a meal.    MULTIVITAMIN ADULT PO Take 1 tablet by mouth daily.   ondansetron 4 MG disintegrating tablet Commonly known as: Zofran ODT Take 1 tablet (4 mg total) by mouth every 8 (eight) hours as needed for nausea or vomiting.   pravastatin 10 MG tablet Commonly known as: PRAVACHOL Take 1 tablet (10 mg total) by mouth daily.   sildenafil 100 MG tablet Commonly known as: VIAGRA Take 100 mg by mouth daily as needed for erectile dysfunction.        Follow-up Information     [facility] Follow up in 1 week(s).   Specialty: Family Medicine Contact information: [address] [place] [address] [phone]                 Consultations: None   Procedures/Studies:  CT ABDOMEN PELVIS WO CONTRAST  Result Date: 08/23/2020 CLINICAL DATA:  Epigastric pain. EXAM: CT ABDOMEN AND PELVIS WITHOUT CONTRAST TECHNIQUE: Multidetector CT imaging of the abdomen and pelvis was performed following the standard protocol without IV contrast. COMPARISON:  Contrast-enhanced CT 01/31/2015 FINDINGS: Lower chest: The lung bases are clear. No pleural effusion or acute airspace disease. Hepatobiliary: No evidence of focal hepatic abnormality on this unenhanced exam. Gallbladder physiologically distended, no calcified stone. No biliary dilatation. Pancreas: No ductal dilatation or inflammation. Spleen: Normal in size without focal abnormality. Adrenals/Urinary Tract: Normal adrenal glands. No hydronephrosis. No renal or ureteral calculi. There is minimal right perinephric edema bilateral low-density lesions typically cysts, incompletely characterized on this unenhanced exam. 2.1 cm cyst in the posterior mid kidney not significantly changed from prior. Minimally distended urinary bladder. Stomach/Bowel: Decompressed stomach. No small bowel obstruction or inflammation. The appendix is not confidently visualized on the current exam. No appendicitis. Small volume of colonic stool without  colonic wall thickening there is moderate stool in the rectum. Vascular/Lymphatic: Mild aorto bi-iliac atherosclerosis. No aortic aneurysm no enlarged lymph nodes in the abdomen or pelvis. Reproductive: Prostate is unremarkable. Seminal vesicle calcifications typical of diabetes. Other: No free air, free fluid, or intra-abdominal fluid collection. Musculoskeletal: There are no acute or suspicious osseous abnormalities. IMPRESSION: 1. Faint right perinephric edema. This may be chronic or can be seen in the setting of urinary tract infection. Recommend correlation with urinalysis. 2. No other acute findings in the abdomen/pelvis. Aortic Atherosclerosis (ICD10-I70.0). Electronically Signed   By: Narda Rutherford M.D.   On: 08/23/2020 17:58   DG Chest Portable 1 View  Result Date: 08/23/2020 CLINICAL DATA:  Nausea and vomiting, hypertension, diabetes mellitus EXAM: PORTABLE CHEST 1 VIEW COMPARISON:  Portable exam 1613 hours without priors for comparison FINDINGS: Normal heart size, mediastinal contours, and pulmonary vascularity. Lungs clear. No infiltrate, pleural effusion, or pneumothorax. Osseous structures unremarkable. IMPRESSION: No acute abnormalities. Electronically Signed   By: Ulyses Southward M.D.   On: 08/23/2020 16:37     Subjective: - no chest pain, shortness of breath, no abdominal pain, nausea or vomiting.   Discharge Exam: BP 121/73 (BP Location: Left Arm)   Pulse 63   Temp 98.8 F (37.1 C) (Oral)   Resp 18   Ht  (1.702 m)  Wt 71.5 kg   SpO2 100%   BMI 24.68 kg/m   General: Pt is alert, awake, not in acute distress Cardiovascular: RRR, S1/S2 +, no rubs, no gallops Respiratory: CTA bilaterally, no wheezing, no rhonchi Abdominal: Soft, NT, ND, bowel sounds + Extremities: no edema, no cyanosis    The results of significant diagnostics from this hospitalization (including imaging, microbiology, ancillary and laboratory) are listed below for reference.      Microbiology: Recent Results (from the past 240 hour(s))  Resp Panel by RT-PCR (Flu A&B, Covid) Nasopharyngeal Swab     Status: None   Collection Time: 08/23/20  4:52 PM   Specimen: Nasopharyngeal Swab; Nasopharyngeal(NP) swabs in vial transport medium  Result Value Ref Range Status   SARS Coronavirus 2 by RT PCR NEGATIVE NEGATIVE Final    Comment: (NOTE) SARS-CoV-2 target nucleic acids are NOT DETECTED.  The SARS-CoV-2 RNA is generally detectable in upper respiratory specimens during the acute phase of infection. The lowest concentration of SARS-CoV-2 viral copies this assay can detect is 138 copies/mL. A negative result does not preclude SARS-Cov-2 infection and should not be used as the sole basis for treatment or other patient management decisions. A negative result may occur with  improper specimen collection/handling, submission of specimen other than nasopharyngeal swab, presence of viral mutation(s) within the areas targeted by this assay, and inadequate number of viral copies(<138 copies/mL). A negative result must be combined with clinical observations, patient history, and epidemiological information. The expected result is Negative.  Fact Sheet for Patients:  BloggerCourse.com  Fact Sheet for Healthcare Providers:  SeriousBroker.it  This test is no t yet approved or cleared by the [place] FDA and  has been authorized for detection and/or diagnosis of SARS-CoV-2 by FDA under an Emergency Use Authorization (EUA). This EUA will remain  in effect (meaning this test can be used) for the duration of the COVID-19 declaration under Section 564(b)(1) of the Act, 21 U.S.C.section 360bbb-3(b)(1), unless the authorization is terminated  or revoked sooner.       Influenza A by PCR NEGATIVE NEGATIVE Final   Influenza B by PCR NEGATIVE NEGATIVE Final    Comment: (NOTE) The Xpert Xpress SARS-CoV-2/FLU/RSV plus assay is  intended as an aid in the diagnosis of influenza from Nasopharyngeal swab specimens and should not be used as a sole basis for treatment. Nasal washings and aspirates are unacceptable for Xpert Xpress SARS-CoV-2/FLU/RSV testing.  Fact Sheet for Patients: BloggerCourse.com  Fact Sheet for Healthcare Providers: SeriousBroker.it  This test is not yet approved or cleared by the [place] FDA and has been authorized for detection and/or diagnosis of SARS-CoV-2 by FDA under an Emergency Use Authorization (EUA). This EUA will remain in effect (meaning this test can be used) for the duration of the COVID-19 declaration under Section 564(b)(1) of the Act, 21 U.S.C. section 360bbb-3(b)(1), unless the authorization is terminated or revoked.  Performed at [facility], [address] [place], [place] [address]      Labs: Basic Metabolic Panel: Recent Labs  Lab 08/23/20 1548 08/23/20 1650 08/24/20 0449  NA 139 140 137  K 5.1 4.9 4.5  CL 106 107 104  CO2 23  --  24  GLUCOSE 204* 197* 202*  BUN 25* 28* 24*  CREATININE 2.44* 2.40* 1.97*  CALCIUM 9.4  --  9.2   Liver Function Tests: Recent Labs  Lab 08/23/20 1548  AST 25  ALT 23  ALKPHOS 56  BILITOT 0.5  PROT 7.1  ALBUMIN 4.3   CBC: Recent Labs  Lab 08/23/20 1548 08/23/20 1650 08/24/20 0449  WBC 7.3  --   --   NEUTROABS 5.3  --   --   HGB 10.8* 11.9* 10.8*  HCT 33.2* 35.0*  --   MCV 97.6  --   --   PLT 252  --   --    CBG: Recent Labs  Lab 08/23/20 1621 08/24/20 0057 08/24/20 0605 08/24/20 1047  GLUCAP 200* 244* 193* 197*   Hgb A1c No results for input(s): HGBA1C in the last 72 hours. Lipid Profile No results for input(s): CHOL, HDL, LDLCALC, TRIG, CHOLHDL, LDLDIRECT in the last 72 hours. Thyroid function studies No results for input(s): TSH, T4TOTAL, T3FREE, THYROIDAB in the last 72 hours.  Invalid input(s): FREET3 Urinalysis    Component  Value Date/Time   COLORURINE STRAW (A) 08/23/2020 1603   APPEARANCEUR CLEAR 08/23/2020 1603   LABSPEC 1.008 08/23/2020 1603   PHURINE 6.0 08/23/2020 1603   GLUCOSEU >=500 (A) 08/23/2020 1603   HGBUR NEGATIVE 08/23/2020 1603   HGBUR negative 02/26/2009 1016   BILIRUBINUR NEGATIVE 08/23/2020 1603   KETONESUR 5 (A) 08/23/2020 1603   PROTEINUR NEGATIVE 08/23/2020 1603   UROBILINOGEN 0.2 05/13/2020 1050   NITRITE NEGATIVE 08/23/2020 1603   LEUKOCYTESUR NEGATIVE 08/23/2020 1603    FURTHER DISCHARGE INSTRUCTIONS:   Get Medicines reviewed and adjusted: Please take all your medications with you for your next visit with your Primary MD   Laboratory/radiological data: Please request your Primary MD to go over all hospital tests and procedure/radiological results at the follow up, please ask your Primary MD to get all Hospital records sent to his/her office.   In some cases, they will be blood work, cultures and biopsy results pending at the time of your discharge. Please request that your primary care M.D. goes through all the records of your hospital data and follows up on these results.   Also Note the following: If you experience worsening of your admission symptoms, develop shortness of breath, life threatening emergency, suicidal or homicidal thoughts you must seek medical attention immediately by calling 911 or calling your MD immediately  if symptoms less severe.   You must read complete instructions/literature along with all the possible adverse reactions/side effects for all the Medicines you take and that have been prescribed to you. Take any new Medicines after you have completely understood and accpet all the possible adverse reactions/side effects.    Do not drive when taking Pain medications or sleeping medications (Benzodaizepines)   Do not take more than prescribed Pain, Sleep and Anxiety Medications. It is not advisable to combine anxiety,sleep and pain medications without  talking with your primary care practitioner   Special Instructions: If you have smoked or chewed Tobacco  in the last 2 yrs please stop smoking, stop any regular Alcohol  and or any Recreational drug use.   Wear Seat belts while driving.   Please note: You were cared for by a hospitalist during your hospital stay. Once you are discharged, your primary care physician will handle any further medical issues. Please note that NO REFILLS for any discharge medications will be authorized once you are discharged, as it is imperative that you return to your primary care physician (or establish a relationship with a primary care physician if you do not have one) for your post hospital discharge needs so that they can reassess your need for medications and monitor your lab values.  Time coordinating discharge: 40  minutes  SIGNED:  Pamella Pert, MD, PhD 08/24/2020, 1:55 PM

## 2020-08-24 NOTE — Discharge Instructions (Signed)
Follow with [facility] in 5-7 days  Please get a complete blood count and chemistry panel checked by your Primary MD at your next visit, and again as instructed by your Primary MD. Please get your medications reviewed and adjusted by your Primary MD.  Please request your Primary MD to go over all Hospital Tests and Procedure/Radiological results at the follow up, please get all Hospital records sent to your Prim MD by signing hospital release before you go home.  In some cases, there will be blood work, cultures and biopsy results pending at the time of your discharge. Please request that your primary care M.D. goes through all the records of your hospital data and follows up on these results.  If you had Pneumonia of Lung problems at the Hospital: Please get a 2 view Chest X ray done in 6-8 weeks after hospital discharge or sooner if instructed by your Primary MD.  If you have Congestive Heart Failure: Please call your Cardiologist or Primary MD anytime you have any of the following symptoms:  1) 3 pound weight gain in 24 hours or 5 pounds in 1 week  2) shortness of breath, with or without a dry hacking cough  3) swelling in the hands, feet or stomach  4) if you have to sleep on extra pillows at night in order to breathe  Follow cardiac low salt diet and 1.5 lit/day fluid restriction.  If you have diabetes Accuchecks 4 times/day, Once in AM empty stomach and then before each meal. Log in all results and show them to your primary doctor at your next visit. If any glucose reading is under 80 or above 300 call your primary MD immediately.  If you have Seizure/Convulsions/Epilepsy: Please do not drive, operate heavy machinery, participate in activities at heights or participate in high speed sports until you have seen by Primary MD or a Neurologist and advised to do so again. Per [facility] statutes, patients with seizures are not allowed to drive until they  have been seizure-free for six months.  Use caution when using heavy equipment or power tools. Avoid working on ladders or at heights. Take showers instead of baths. Ensure the water temperature is not too high on the home water heater. Do not go swimming alone. Do not lock yourself in a room alone (i.e. bathroom). When caring for infants or small children, sit down when holding, feeding, or changing them to minimize risk of injury to the child in the event you have a seizure. Maintain good sleep hygiene. Avoid alcohol.   If you had Gastrointestinal Bleeding: Please ask your Primary MD to check a complete blood count within one week of discharge or at your next visit. Your endoscopic/colonoscopic biopsies that are pending at the time of discharge, will also need to followed by your Primary MD.  Get Medicines reviewed and adjusted. Please take all your medications with you for your next visit with your Primary MD  Please request your Primary MD to go over all hospital tests and procedure/radiological results at the follow up, please ask your Primary MD to get all Hospital records sent to his/her office.  If you experience worsening of your admission symptoms, develop shortness of breath, life threatening emergency, suicidal or homicidal thoughts you must seek medical attention immediately by calling 911 or calling your MD immediately  if symptoms less severe.  You must read complete instructions/literature along with all the possible adverse reactions/side effects for all the Medicines  you take and that have been prescribed to you. Take any new Medicines after you have completely understood and accpet all the possible adverse reactions/side effects.   Do not drive or operate heavy machinery when taking Pain medications.   Do not take more than prescribed Pain, Sleep and Anxiety Medications  Special Instructions: If you have smoked or chewed Tobacco  in the last 2 yrs please stop smoking, stop any  regular Alcohol  and or any Recreational drug use.  Wear Seat belts while driving.  Please note You were cared for by a hospitalist during your hospital stay. If you have any questions about your discharge medications or the care you received while you were in the hospital after you are discharged, you can call the unit and asked to speak with the hospitalist on call if the hospitalist that took care of you is not available. Once you are discharged, your primary care physician will handle any further medical issues. Please note that NO REFILLS for any discharge medications will be authorized once you are discharged, as it is imperative that you return to your primary care physician (or establish a relationship with a primary care physician if you do not have one) for your aftercare needs so that they can reassess your need for medications and monitor your lab values.  You can reach the hospitalist office at phone [phone] or fax [phone]   If you do not have a primary care physician, you can call [phone] for a physician referral.  Activity: As tolerated with Full fall precautions use walker/cane & assistance as needed    Diet: diabetic  Disposition Home

## 2020-08-24 NOTE — Progress Notes (Signed)
Nutrition Brief Note  Patient identified on the Malnutrition Screening Tool (MST) Report. Patient reports that he gained weight after starting to take testosterone, then lost 23 lbs when he stopped taking it. He was dehydrated on admission d/t vomiting. Vomiting has stopped and patient is hopeful to discharge home today.   Wt Readings from Last 15 Encounters:  08/23/20 71.5 kg  04/23/19 78.5 kg  08/19/15 72.6 kg  01/31/15 72.6 kg  09/05/14 74.8 kg  07/11/14 75.8 kg  06/13/14 79.4 kg  05/19/13 78.9 kg  08/22/12 84.4 kg    Body mass index is 24.68 kg/m. Patient meets criteria for normal weight based on current BMI.   Current diet order is full liquids, patient is consuming approximately 100% of meals at this time. Labs and medications reviewed.   No nutrition interventions warranted at this time. If nutrition issues arise, please consult RD.   Gabriel Rainwater, RD, LDN, CNSC Please refer to [facility] for contact information.

## 2020-08-24 NOTE — Progress Notes (Signed)
Inpatient Diabetes Program Recommendations  AACE/ADA: New Consensus Statement on Inpatient Glycemic Control (2015)  Target Ranges:  Prepandial:   less than 140 mg/dL      Peak postprandial:   less than 180 mg/dL (1-2 hours)      Critically ill patients:  140 - 180 mg/dL   Lab Results  Component Value Date   GLUCAP 197 (H) 08/24/2020   HGBA1C 16.8 (H) 04/23/2019    Review of Glycemic Control Results for James Bradford, James Bradford (MRN [id number]) as of 08/24/2020 13:37  Ref. Range 08/23/2020 16:21 08/24/2020 00:57 08/24/2020 06:05 08/24/2020 10:47  Glucose-Capillary Latest Ref Range: 70 - 99 mg/dL 953 (H) 202 (H) 334 (H) 197 (H)   Diabetes history: DM 2 Outpatient Diabetes medications:  Glucotrol 10 mg bid Metformin 1000 mg bid Current orders for Inpatient glycemic control:  None Inpatient Diabetes Program Recommendations:    Please consider adding Novolog sensitive tid with meals and HS. Also please add A1C to labs.  It appears A1C was >16% last year.   Thanks,  Beryl Meager, RN, BC-ADM Inpatient Diabetes Coordinator Pager [phone]  (8a-5p)

## 2020-09-28 ENCOUNTER — Observation Stay (HOSPITAL_COMMUNITY)
Admission: EM | Admit: 2020-09-28 | Discharge: 2020-09-29 | Disposition: A | Discharge status: Home / Self Care | Payer: Self-pay | Attending: Internal Medicine | Admitting: Internal Medicine

## 2020-09-28 ENCOUNTER — Encounter (HOSPITAL_COMMUNITY): Payer: Self-pay | Admitting: Internal Medicine

## 2020-09-28 ENCOUNTER — Other Ambulatory Visit: Payer: Self-pay

## 2020-09-28 DIAGNOSIS — R112 Nausea with vomiting, unspecified: Secondary | ICD-10-CM | POA: Diagnosis present

## 2020-09-28 DIAGNOSIS — E1165 Type 2 diabetes mellitus with hyperglycemia: Secondary | ICD-10-CM | POA: Insufficient documentation

## 2020-09-28 DIAGNOSIS — I10 Essential (primary) hypertension: Secondary | ICD-10-CM | POA: Diagnosis present

## 2020-09-28 DIAGNOSIS — N1832 Chronic kidney disease, stage 3b: Secondary | ICD-10-CM | POA: Insufficient documentation

## 2020-09-28 DIAGNOSIS — N183 Chronic kidney disease, stage 3 unspecified: Secondary | ICD-10-CM | POA: Diagnosis present

## 2020-09-28 DIAGNOSIS — E1169 Type 2 diabetes mellitus with other specified complication: Secondary | ICD-10-CM | POA: Diagnosis present

## 2020-09-28 DIAGNOSIS — Z20822 Contact with and (suspected) exposure to covid-19: Secondary | ICD-10-CM | POA: Insufficient documentation

## 2020-09-28 DIAGNOSIS — IMO0002 Reserved for concepts with insufficient information to code with codable children: Secondary | ICD-10-CM | POA: Diagnosis present

## 2020-09-28 DIAGNOSIS — Z7984 Long term (current) use of oral hypoglycemic drugs: Secondary | ICD-10-CM | POA: Insufficient documentation

## 2020-09-28 DIAGNOSIS — Z79899 Other long term (current) drug therapy: Secondary | ICD-10-CM | POA: Insufficient documentation

## 2020-09-28 DIAGNOSIS — N179 Acute kidney failure, unspecified: Principal | ICD-10-CM | POA: Diagnosis present

## 2020-09-28 DIAGNOSIS — E785 Hyperlipidemia, unspecified: Secondary | ICD-10-CM | POA: Diagnosis present

## 2020-09-28 DIAGNOSIS — F1210 Cannabis abuse, uncomplicated: Secondary | ICD-10-CM | POA: Diagnosis present

## 2020-09-28 DIAGNOSIS — R1013 Epigastric pain: Secondary | ICD-10-CM

## 2020-09-28 DIAGNOSIS — I129 Hypertensive chronic kidney disease with stage 1 through stage 4 chronic kidney disease, or unspecified chronic kidney disease: Secondary | ICD-10-CM | POA: Insufficient documentation

## 2020-09-28 DIAGNOSIS — E1122 Type 2 diabetes mellitus with diabetic chronic kidney disease: Secondary | ICD-10-CM | POA: Insufficient documentation

## 2020-09-28 DIAGNOSIS — E1159 Type 2 diabetes mellitus with other circulatory complications: Secondary | ICD-10-CM | POA: Diagnosis present

## 2020-09-28 DIAGNOSIS — E871 Hypo-osmolality and hyponatremia: Secondary | ICD-10-CM | POA: Insufficient documentation

## 2020-09-28 HISTORY — DX: Cannabis abuse, uncomplicated: F12.10

## 2020-09-28 LAB — COMPREHENSIVE METABOLIC PANEL
ALT: 25 U/L (ref 0–44)
AST: 34 U/L (ref 15–41)
Albumin: 4.8 g/dL (ref 3.5–5.0)
Alkaline Phosphatase: 60 U/L (ref 38–126)
Anion gap: 19 — ABNORMAL HIGH (ref 5–15)
BUN: 52 mg/dL — ABNORMAL HIGH (ref 6–20)
CO2: 20 mmol/L — ABNORMAL LOW (ref 22–32)
Calcium: 9.9 mg/dL (ref 8.9–10.3)
Chloride: 90 mmol/L — ABNORMAL LOW (ref 98–111)
Creatinine, Ser: 3.84 mg/dL — ABNORMAL HIGH (ref 0.61–1.24)
GFR, Estimated: 18 mL/min — ABNORMAL LOW (ref >60)
Glucose, Bld: 331 mg/dL — ABNORMAL HIGH (ref 70–99)
Potassium: 4.1 mmol/L (ref 3.5–5.1)
Sodium: 129 mmol/L — ABNORMAL LOW (ref 135–145)
Total Bilirubin: 1.4 mg/dL — ABNORMAL HIGH (ref 0.3–1.2)
Total Protein: 7.9 g/dL (ref 6.5–8.1)

## 2020-09-28 LAB — RAPID URINE DRUG SCREEN, HOSP PERFORMED
Amphetamines: NOT DETECTED
Barbiturates: NOT DETECTED
Benzodiazepines: NOT DETECTED
Cocaine: NOT DETECTED
Opiates: NOT DETECTED
Tetrahydrocannabinol: POSITIVE — AB

## 2020-09-28 LAB — URINALYSIS, ROUTINE W REFLEX MICROSCOPIC
Bilirubin Urine: NEGATIVE
Glucose, UA: >=500 mg/dL — AB
Ketones, ur: 5 mg/dL — AB
Leukocytes,Ua: NEGATIVE
Nitrite: NEGATIVE
Protein, ur: NEGATIVE mg/dL
Specific Gravity, Urine: 1.01 (ref 1.005–1.030)
pH: 6 (ref 5.0–8.0)

## 2020-09-28 LAB — CBC
HCT: 41.5 % (ref 39.0–52.0)
Hemoglobin: 14.5 g/dL (ref 13.0–17.0)
MCH: 31.9 pg (ref 26.0–34.0)
MCHC: 34.9 g/dL (ref 30.0–36.0)
MCV: 91.4 fL (ref 80.0–100.0)
Platelets: 303 10*3/uL (ref 150–400)
RBC: 4.54 MIL/uL (ref 4.22–5.81)
RDW: 12.3 % (ref 11.5–15.5)
WBC: 8 10*3/uL (ref 4.0–10.5)
nRBC: 0 % (ref 0.0–0.2)

## 2020-09-28 LAB — CBG MONITORING, ED
Glucose-Capillary: 358 mg/dL — ABNORMAL HIGH (ref 70–99)
Glucose-Capillary: 328 mg/dL — ABNORMAL HIGH (ref 70–99)

## 2020-09-28 LAB — GLUCOSE, CAPILLARY
Glucose-Capillary: 42 mg/dL — CL (ref 70–99)
Glucose-Capillary: 59 mg/dL — ABNORMAL LOW (ref 70–99)
Glucose-Capillary: 94 mg/dL (ref 70–99)
Glucose-Capillary: 176 mg/dL — ABNORMAL HIGH (ref 70–99)

## 2020-09-28 LAB — HEMOGLOBIN A1C
Hgb A1c MFr Bld: 7.4 % — ABNORMAL HIGH (ref 4.8–5.6)
Mean Plasma Glucose: 165.68 mg/dL

## 2020-09-28 LAB — RESP PANEL BY RT-PCR (FLU A&B, COVID) ARPGX2
Influenza A by PCR: NEGATIVE
Influenza B by PCR: NEGATIVE
SARS Coronavirus 2 by RT PCR: NEGATIVE

## 2020-09-28 LAB — LIPASE, BLOOD: Lipase: 48 U/L (ref 11–51)

## 2020-09-28 MED ORDER — INSULIN ASPART 100 UNIT/ML IJ SOLN: 0.0000 [IU] | Freq: Every day | INTRAMUSCULAR | Status: DC

## 2020-09-28 MED ORDER — ONDANSETRON HCL 4 MG PO TABS
4.0000 mg | ORAL_TABLET | Freq: Four times a day (QID) | ORAL | Status: DC | PRN
  Administered 2020-09-29: 4 mg via ORAL
  Filled 2020-09-28: qty 1

## 2020-09-28 MED ORDER — BISACODYL 5 MG PO TBEC: 5.0000 mg | DELAYED_RELEASE_TABLET | Freq: Every day | ORAL | Status: DC | PRN

## 2020-09-28 MED ORDER — ONDANSETRON HCL 4 MG/2ML IJ SOLN
4.0000 mg | Freq: Four times a day (QID) | INTRAMUSCULAR | Status: DC | PRN
  Administered 2020-09-28: 4 mg via INTRAVENOUS
  Filled 2020-09-28: qty 2

## 2020-09-28 MED ORDER — PRAVASTATIN SODIUM 10 MG PO TABS
10.0000 mg | ORAL_TABLET | Freq: Every day | ORAL | Status: DC
  Administered 2020-09-28 – 2020-09-29 (×2): 10 mg via ORAL
  Filled 2020-09-28 (×2): qty 1

## 2020-09-28 MED ORDER — HALOPERIDOL LACTATE 5 MG/ML IJ SOLN
2.0000 mg | Freq: Once | INTRAMUSCULAR | Status: AC
  Administered 2020-09-28: 2 mg via INTRAVENOUS
  Filled 2020-09-28: qty 1

## 2020-09-28 MED ORDER — SODIUM CHLORIDE 0.9 % IV SOLN: 25.0000 mg | Freq: Four times a day (QID) | INTRAVENOUS | Status: DC | PRN

## 2020-09-28 MED ORDER — ACETAMINOPHEN 650 MG RE SUPP: 650.0000 mg | Freq: Four times a day (QID) | RECTAL | Status: DC | PRN

## 2020-09-28 MED ORDER — INSULIN ASPART 100 UNIT/ML IJ SOLN
0.0000 [IU] | Freq: Three times a day (TID) | INTRAMUSCULAR | Status: DC
  Administered 2020-09-28: 9 [IU] via SUBCUTANEOUS
  Administered 2020-09-29: 2 [IU] via SUBCUTANEOUS
  Administered 2020-09-29: 1 [IU] via SUBCUTANEOUS

## 2020-09-28 MED ORDER — SODIUM CHLORIDE 0.9 % IV BOLUS
1000.0000 mL | Freq: Once | INTRAVENOUS | Status: AC
  Administered 2020-09-28: 1000 mL via INTRAVENOUS

## 2020-09-28 MED ORDER — DIPHENHYDRAMINE HCL 50 MG/ML IJ SOLN
12.5000 mg | Freq: Once | INTRAMUSCULAR | Status: AC
  Administered 2020-09-28: 12.5 mg via INTRAVENOUS
  Filled 2020-09-28: qty 1

## 2020-09-28 MED ORDER — INSULIN ASPART 100 UNIT/ML IJ SOLN
3.0000 [IU] | Freq: Three times a day (TID) | INTRAMUSCULAR | Status: DC
  Administered 2020-09-28 – 2020-09-29 (×3): 3 [IU] via SUBCUTANEOUS

## 2020-09-28 MED ORDER — LACTATED RINGERS IV SOLN: INTRAVENOUS | Status: DC

## 2020-09-28 MED ORDER — ACETAMINOPHEN 325 MG PO TABS
650.0000 mg | ORAL_TABLET | Freq: Four times a day (QID) | ORAL | Status: DC | PRN
  Administered 2020-09-29: 650 mg via ORAL
  Filled 2020-09-28 (×2): qty 2

## 2020-09-28 MED ORDER — METOCLOPRAMIDE HCL 5 MG PO TABS
5.0000 mg | ORAL_TABLET | Freq: Three times a day (TID) | ORAL | Status: DC
  Administered 2020-09-28 – 2020-09-29 (×4): 5 mg via ORAL
  Filled 2020-09-28 (×4): qty 1

## 2020-09-28 MED ORDER — POLYETHYLENE GLYCOL 3350 17 G PO PACK: 17.0000 g | PACK | Freq: Every day | ORAL | Status: DC | PRN

## 2020-09-28 MED ORDER — ENOXAPARIN SODIUM 30 MG/0.3ML IJ SOSY
30.0000 mg | PREFILLED_SYRINGE | INTRAMUSCULAR | Status: DC
  Administered 2020-09-28 – 2020-09-29 (×2): 30 mg via SUBCUTANEOUS
  Filled 2020-09-28 (×2): qty 0.3

## 2020-09-28 MED ORDER — HYDRALAZINE HCL 20 MG/ML IJ SOLN: 5.0000 mg | INTRAMUSCULAR | Status: DC | PRN

## 2020-09-28 MED ORDER — DICYCLOMINE HCL 20 MG PO TABS
20.0000 mg | ORAL_TABLET | Freq: Two times a day (BID) | ORAL | Status: DC
  Administered 2020-09-28 – 2020-09-29 (×3): 20 mg via ORAL
  Filled 2020-09-28 (×4): qty 1

## 2020-09-28 MED ORDER — DOCUSATE SODIUM 100 MG PO CAPS
100.0000 mg | ORAL_CAPSULE | Freq: Two times a day (BID) | ORAL | Status: DC
  Administered 2020-09-28 – 2020-09-29 (×3): 100 mg via ORAL
  Filled 2020-09-28 (×3): qty 1

## 2020-09-28 NOTE — Plan of Care (Signed)

## 2020-09-28 NOTE — ED Notes (Signed)
A/O, denies pain/discomfort. Resting comfortably.

## 2020-09-28 NOTE — Progress Notes (Signed)
Initial Nutrition Assessment  DOCUMENTATION CODES:   Not applicable  INTERVENTION:   -RD will follow for diet advancement and add supplement advancement as appropriate -RD messaged MD via secure chart regarding diet advancement  NUTRITION DIAGNOSIS:   Inadequate oral intake related to inability to eat as evidenced by NPO status.  GOAL:   Patient will meet greater than or equal to 90% of their needs  MONITOR:   Labs, Diet advancement, Weight trends, Skin, I & O's  REASON FOR ASSESSMENT:   Consult, Malnutrition Screening Tool Assessment of nutrition requirement/status  ASSESSMENT:   James Bradford is a 55 y.o. year-old male with a history of diabetes, CKD presenting to the ED with chief complaint of abdominal pain.  Pt admitted with hyperglycemia, nausea, and emesis.   Reviewed I/O's: +603 ml x 24 hours  UOP: 400 ml x 24 hours  Spoke with pt at bedside, who reports he feels better since admission. He reports that his abdominal pain has improved and was able to consume a soda due to hypoglycemic episode recently. He is looking forward to being able to eat. RD educated on rationale for NPO diet order. RD message MD regarding possibility for diet advancement.   Pt has had minimal intake other than water and ice chips over the past 5 days, due to abdominal pain and inability to keep foods down. Prior to acute illness, pt was in his usual state of health- he generally consumes 1 large meal and two snacks daily.  Pt reports he has had a least 2 episodes of hypoglycemia in the past month. He reports that he is followed by Adult and Pediatric Medicine and his DM medications have not been changed recently. He has an upcoming appointment in a few weeks. Diabetes Coordinator consult pending.   Per pt, his UBW is around 155-158#. Reviewed wt hx; pt has experienced a 1.7% wt loss over the past month, which is significant for time frame.   Medications reviewed and include colace, reglan,  and lactated ringers infusion @ 75 ml/hr.   Lab Results  Component Value Date   HGBA1C 7.4 (H) 09/28/2020   PTA DM medications are 1000 mg metformin BID.   Labs reviewed: Na: 129, CBGS: 193-358 (inpatient orders for glycemic control are 0-5 units insulin aspart daily at bedtime, 0-9 units inuslin aspart TID with meals, and3 units insulin aspart TID with meals).    NUTRITION - FOCUSED PHYSICAL EXAM:  Flowsheet Row Most Recent Value  Orbital Region No depletion  Upper Arm Region No depletion  Thoracic and Lumbar Region No depletion  Buccal Region No depletion  Temple Region No depletion  Clavicle Bone Region Mild depletion  Clavicle and Acromion Bone Region No depletion  Scapular Bone Region No depletion  Dorsal Hand No depletion  Patellar Region Mild depletion  Anterior Thigh Region Mild depletion  Posterior Calf Region Mild depletion  Edema (RD Assessment) None  Hair Reviewed  Eyes Reviewed  Mouth Reviewed  Skin Reviewed  Nails Reviewed       Diet Order:   Diet Order     None       EDUCATION NEEDS:   Education needs have been addressed  Skin:  Skin Assessment: Reviewed RN Assessment  Last BM:  Unknown  Height:   Ht Readings from Last 1 Encounters:  09/28/20 5\' 7"  (1.702 m)    Weight:   Wt Readings from Last 1 Encounters:  09/28/20 70.3 kg    Ideal Body Weight:  67.3 kg  BMI:  Body mass index is 24.28 kg/m.  Estimated Nutritional Needs:   Kcal:  1900-2100  Protein:  90-105 grams  Fluid:  > 1.9 L    Levada Schilling, RD, LDN, CDCES Registered Dietitian II Certified Diabetes Care and Education Specialist Please refer to [facility] for RD and/or RD on-call/weekend/after hours pager

## 2020-09-28 NOTE — Progress Notes (Addendum)
Inpatient Diabetes Program Recommendations  AACE/ADA: New Consensus Statement on Inpatient Glycemic Control (2015)  Target Ranges:  Prepandial:   less than 140 mg/dL      Peak postprandial:   less than 180 mg/dL (1-2 hours)      Critically ill patients:  140 - 180 mg/dL   Lab Results  Component Value Date   GLUCAP 328 (H) 09/28/2020   HGBA1C 7.4 (H) 09/28/2020    Review of Glycemic Control  Diabetes history: DM2 Outpatient Diabetes medications: metformin 1000 mg BID and glipizide 10 mg BID Current orders for Inpatient glycemic control: Novolog 0-9 units TID with meals and 0-5 HS + 3 units TID   HgbA1C - 7.4% Not checking blood sugars at home.  Blood sugars very labile.  Inpatient Diabetes Program Recommendations:    Spoke with pt about his diabetes control at home. Pt states he has not been checking blood sugars, although goes to PCP every 3 months. Discussed hypoglycemia s/s and treatment. Pt states he feels like he goes low sometimes at home. Discussed HgbA1C of 7.4%. Pt said he's been trying to lose weight and eat healthier. Will need to purchase meter to check blood sugars. States he can afford to buy meter at [facility] and will do so after discharge. Has no problems in getting metformin and glipizide. Watch glucose trends. May need small amount of basal if FBSs > 180 mg/dL. Answered questions and pt appreciative of information.   Thank you. Ailene Ards, RD, LDN, CDE Inpatient Diabetes Coordinator [phone]

## 2020-09-28 NOTE — ED Notes (Signed)
Pt resting at this time NAD noted

## 2020-09-28 NOTE — ED Triage Notes (Signed)
Pt BIB EMS from home. EMS reports pt is type 2 diabetic, has had abdominal pain, N/V for the last 3 days. Pt has not been able to check his sugar at home. CBG with EMS 407. HR with EMS initially 115 - EMS gave 350 of fluids and 4 of Zofran. Last HR 75.  EMS reports pt is taking metformin, glipizide, and lisinopril

## 2020-09-28 NOTE — ED Provider Notes (Signed)
[facility]EM[facility]ENC[facility] DEPT [facility] Emergency Department Provider Note MRN:  [id number]  Arrival date & time: 09/28/20     Chief Complaint   Hyperglycemia, Nausea, and Emesis   History of Present Illness   James Bradford is a 55 y.o. year-old male with a history of diabetes, CKD presenting to the ED with chief complaint of abdominal pain.  Location: Epigastric Duration: 1 or 2 weeks Onset: Gradual Timing: Constant Description: Burning ache Severity: Severe Exacerbating/Alleviating Factors: None Associated Symptoms: Nausea vomiting Pertinent Negatives: Denies diarrhea, no chest pain or shortness of breath, no fever  Additional History: None  Review of Systems  A complete 10 system review of systems was obtained and all systems are negative except as noted in the HPI and PMH.   Patient's Health History    Past Medical History:  Diagnosis Date   Aortic atherosclerosis (HCC) 08/24/2020   Hyperlipidemia    Hypertension    Stage 3 chronic kidney disease (HCC)    Type 2 diabetes mellitus (HCC)    Unspecified glaucoma 02/18/2008   Qualifier: Diagnosis of  By: Daphine Deutscher FNP, Zena Amos      Past Surgical History:  Procedure Laterality Date   HAND SURGERY     LEG SURGERY      Family History  Problem Relation Age of Onset   Hypertension Mother    Pancreatic cancer Father     Social History   Socioeconomic History   Marital status: Single    Spouse name: Not on file   Number of children: Not on file   Years of education: Not on file   Highest education level: Not on file  Occupational History   Not on file  Tobacco Use   Smoking status: Never   Smokeless tobacco: Never  Substance and Sexual Activity   Alcohol use: No   Drug use: No   Sexual activity: Not on file  Other Topics Concern   Not on file  Social History Narrative   Not on file   Social Determinants of Health   Financial Resource Strain: Not on file  Food Insecurity: Not on file  Transportation Needs:  Not on file  Physical Activity: Not on file  Stress: Not on file  Social Connections: Not on file  Intimate Partner Violence: Not on file     Physical Exam   Vitals:   09/28/20 0259 09/28/20 0457  BP: (!) 151/95 105/76  Pulse: 94 98  Resp: 20 16  Temp: 99.1 F (37.3 C)   SpO2: 96% 94%    CONSTITUTIONAL: Well-appearing, NAD NEURO:  Alert and oriented x 3, no focal deficits EYES:  eyes equal and reactive ENT/NECK:  no LAD, no JVD CARDIO: Regular rate, well-perfused, normal S1 and S2 PULM:  CTAB no wheezing or rhonchi GI/GU:  normal bowel sounds, non-distended, non-tender MSK/SPINE:  No gross deformities, no edema SKIN:  no rash, atraumatic PSYCH:  Appropriate speech and behavior  *Additional and/or pertinent findings included in MDM below  Diagnostic and Interventional Summary    EKG Interpretation  Date/Time:    Ventricular Rate:    PR Interval:    QRS Duration:   QT Interval:    QTC Calculation:   R Axis:     Text Interpretation:         Labs Reviewed  COMPREHENSIVE METABOLIC PANEL - Abnormal; Notable for the following components:      Result Value   Sodium 129 (*)    Chloride 90 (*)    CO2 20 (*)  Glucose, Bld 331 (*)    BUN 52 (*)    Creatinine, Ser 3.84 (*)    Total Bilirubin 1.4 (*)    GFR, Estimated 18 (*)    Anion gap 19 (*)    All other components within normal limits  CBG MONITORING, ED - Abnormal; Notable for the following components:   Glucose-Capillary 328 (*)    All other components within normal limits  RESP PANEL BY RT-PCR (FLU A&B, COVID) ARPGX2  CBC  LIPASE, BLOOD    No orders to display    Medications  sodium chloride 0.9 % bolus 1,000 mL (0 mLs Intravenous Stopped 09/28/20 0503)  haloperidol lactate (HALDOL) injection 2 mg (2 mg Intravenous Given 09/28/20 0401)  sodium chloride 0.9 % bolus 1,000 mL (1,000 mLs Intravenous New Bag/Given 09/28/20 0555)     Procedures  /  Critical Care Procedures  ED Course and Medical  Decision Making  I have reviewed the triage vital signs, the nursing notes, and pertinent available records from the EMR.  Listed above are laboratory and imaging tests that I personally ordered, reviewed, and interpreted and then considered in my medical decision making (see below for details).  Differential diagnosis includes pancreatitis, gastroparesis, DKA.  Abdomen is soft and nontender.  Vital signs are normal.  Awaiting labs, will trial Haldol and fluids and reassess.     Patient with significant improvement after Haldol.  Repeat abdominal exam is again reassuring.  No indication for imaging at this time.  Labs reveal AKI, admitted to medicine.  Elmer Sow. Pilar Plate, MD [facility] Emergency Medicine [facility] [email or web address]  Final Clinical Impressions(s) / ED Diagnoses     ICD-10-CM   1. Epigastric pain  R10.13     2. Non-intractable vomiting with nausea, unspecified vomiting type  R11.2     3. AKI (acute kidney injury) (HCC)  N17.9       ED Discharge Orders     None        Discharge Instructions Discussed with and Provided to Patient:   Discharge Instructions   None       Sabas Sous, MD 09/28/20 [phone]

## 2020-09-28 NOTE — ED Notes (Signed)
Pt ambulatory to restroom and back.

## 2020-09-28 NOTE — Progress Notes (Signed)
[facility] informed patient is c/o anxiety none noted in HX also requested benadryl for itching. Will continue to monitor. Ilean Skill LPN

## 2020-09-28 NOTE — H&P (Signed)
History and Physical    James Bradford QMV:[id number] DOB: 05/25/1965 DOA: 09/28/2020  PCP: [facility], [facility]:  None Patient coming from:  Home - lives with a friend; NOK: Friend, [phone]  Chief Complaint: N/V  HPI: James Bradford is a 55 y.o. male with medical history significant of HTN; DM; HLD: and stage 3b CKD presenting with n/v.  His diabetes is uncontrolled.  He thought his sugar was low but but was high.  He was unable to keep anything down.  It started Tuesday.  He thought it would improve if he got his blood sugar up but it didn't.  He was here for similar issues about a month ago.  No recent medication changes.  Lots of hiccups.  Last emesis was made an hour ago.  He is on dicyclomine to help keep the metformin down.    ED Course: Carryover, per Dr. Leafy Half:  Patient with past medical history of diabetes presenting with intractable nausea and vomiting with concurrent acute kidney injury.  Similar in presentation to recent hospitalization.  ER provider suspects possible gastroparesis although this is never been formally diagnosed.  Patient improved with intravenous fluids and dose of intravenous Haldol.  ER provider requesting hospitalization for intractable nausea and vomiting with acute kidney injury.  Of note, CT imaging during recent hospitalization was unremarkable and therefore this is not repeated.  ER provider states abdomen is completely benign on exam with exception of some epigastric tenderness.  Review of Systems: As per HPI; otherwise review of systems reviewed and negative.   Ambulatory Status:  Ambulates without assistance  COVID Vaccine Status:  None  Past Medical History:  Diagnosis Date   Aortic atherosclerosis (HCC) 08/24/2020   Hyperlipidemia    Hypertension    Marijuana abuse 09/28/2020   Stage 3 chronic kidney disease (HCC)    Type 2 diabetes mellitus (HCC)    Unspecified glaucoma 02/18/2008   Qualifier: Diagnosis of   By: Daphine Deutscher FNP, Zena Amos      Past Surgical History:  Procedure Laterality Date   HAND SURGERY     LEG SURGERY      Social History   Socioeconomic History   Marital status: Single    Spouse name: Not on file   Number of children: Not on file   Years of education: Not on file   Highest education level: Not on file  Occupational History   Occupation: unemployed  Tobacco Use   Smoking status: Never   Smokeless tobacco: Never  Substance and Sexual Activity   Alcohol use: No   Drug use: No   Sexual activity: Not on file  Other Topics Concern   Not on file  Social History Narrative   Not on file   Social Determinants of Health   Financial Resource Strain: Not on file  Food Insecurity: Not on file  Transportation Needs: Not on file  Physical Activity: Not on file  Stress: Not on file  Social Connections: Not on file  Intimate Partner Violence: Not on file    Allergies  Allergen Reactions   Ibuprofen Other (See Comments)    Hurts his stomach    Aspirin Other (See Comments)    Stomach irritation    Family History  Problem Relation Age of Onset   Hypertension Mother    Pancreatic cancer Father     Prior to Admission medications   Medication Sig Start Date End Date Taking? Authorizing Provider  Accu-Chek Softclix Lancets lancets Use as instructed 04/24/19  Yes Derrel Nip, MD  dicyclomine (BENTYL) 20 MG tablet Take 1 tablet (20 mg total) by mouth 2 (two) times daily. 12/25/19  Yes Moshe Cipro, NP  glipiZIDE (GLUCOTROL) 10 MG tablet Take 10 mg by mouth 2 (two) times daily. 08/03/20  Yes [provider]  glucose blood (ACCU-CHEK GUIDE) test strip Use as instructed 04/24/19  Yes Derrel Nip, MD  KRILL OIL PO Take 1 capsule by mouth daily.   Yes [provider]  lisinopril (ZESTRIL) 10 MG tablet Take 1 tablet (10 mg total) by mouth daily. 04/24/19  Yes Derrel Nip, MD  metFORMIN (GLUCOPHAGE) 1000 MG tablet Take 1 tablet (1,000 mg  total) by mouth 2 (two) times daily with a meal. 04/24/19  Yes Derrel Nip, MD  Multiple Vitamin (MULTIVITAMIN ADULT PO) Take 1 tablet by mouth daily.   Yes [provider]  pravastatin (PRAVACHOL) 10 MG tablet Take 1 tablet (10 mg total) by mouth daily. 04/24/19  Yes Derrel Nip, MD  sildenafil (VIAGRA) 100 MG tablet Take 100 mg by mouth daily as needed for erectile dysfunction. 12/21/19  Yes [provider]  ondansetron (ZOFRAN ODT) 4 MG disintegrating tablet Take 1 tablet (4 mg total) by mouth every 8 (eight) hours as needed for nausea or vomiting. Patient not taking: Reported on 09/28/2020 05/13/20   Jeani Hawking, PA-C    Physical Exam: Vitals:   09/28/20 1000 09/28/20 1115 09/28/20 1420 09/28/20 1715  BP: (!) 174/94 (!) 168/87 (!) 147/94 (!) 140/92  Pulse:  75 97 83  Resp: 15 13 20 18   Temp:  98.7 F (37.1 C) 98.9 F (37.2 C) 99.1 F (37.3 C)  TempSrc:  Oral Oral Oral  SpO2: 100% 99% 100% 100%  Weight:      Height:         General:  Appears calm and comfortable and is in NAD Eyes:  PERRL, EOMI, normal lids, iris ENT:  grossly normal hearing, lips & tongue, mildly dry mm Neck:  no LAD, masses or thyromegaly Cardiovascular:  RRR, no m/r/g. No LE edema.  Respiratory:   CTA bilaterally with no wheezes/rales/rhonchi.  Normal respiratory effort. Abdomen:  soft, scant midepigastric TTP, ND Skin:  no rash or induration seen on limited exam Musculoskeletal:  grossly normal tone BUE/BLE, good ROM, no bony abnormality Lower extremity:  No LE edema.  Limited foot exam with no ulcerations.  2+ distal pulses. Psychiatric:  blunted mood and affect, speech fluent and appropriate, AOx3 Neurologic:  CN 2-12 grossly intact, moves all extremities in coordinated fashion    Radiological Exams on Admission: Independently reviewed - see discussion in A/P where applicable  No results found.  EKG: not done   Labs on Admission: I have personally reviewed the  available labs and imaging studies at the time of the admission.  Pertinent labs:   Na++ 129 Glucose 331 BUN 52/Creatinine 3.84/GFR 18 Anion gap 19 Normal CBC COVID/flu negative UA: >500 glucose, moderate Hgb, 5 ketones, rare bacteria UDS + THC   Assessment/Plan Principal Problem:   Intractable nausea and vomiting Active Problems:   Uncontrolled type 2 diabetes mellitus (HCC)   Essential hypertension   Hyperlipidemia   Stage 3 chronic kidney disease (HCC)   AKI (acute kidney injury) (HCC)   Marijuana abuse   Intractable n/v -Patient with probable gastroparesis and uncontrolled DM presenting with n/v -Symptoms are improved and he has been able to tolerate some liquids -Will observe on med surg -Advance diet as tolerated -Start Reglan, continue Bentyl -  Also with hiccups - will give prn Thorazine -He denies THC use but UDS is positive - likely also a component of cannabinoid hyperemesis syndrome  Uncontrolled DM -Will check A1c -hold Glucophage, Glucotrol -Cover with moderate-scale SSI  -Diabetes coordinator consulted  HTN -Hold Lisinopril in the setting of AKI -May benefit from SGLT2 inhibitor as an outpatient  HLD -Continue Pravachol  AKI on Stage 3b CKD -Baseline creatinine is in the 1.5-2.5 range -Current creatinine is 3.84 with GFR 18 -Will hydrate and follow -Repeat BMP in AM -Hold ACE  Marijuana abuse -Cessation should be encouraged -UDS ordered and positive     Note: This patient has been tested and is negative for the novel coronavirus COVID-19. The patient has NOT been vaccinated against COVID-19.   Level of care: Med-Surg DVT prophylaxis:  Lovenox Code Status:  Full - confirmed with patient Family Communication: None present Disposition Plan:  The patient is from: home  Anticipated d/c is to: home without [facility] services   Anticipated d/c date will depend on clinical response to treatment, but possibly as early as tomorrow if he has excellent  response to treatment  Patient is currently: acutely ill Consults called: Diabetes coordinator  Admission status:  It is my clinical opinion that referral for OBSERVATION is reasonable and necessary in this patient based on the above information provided. The aforementioned taken together are felt to place the patient at high risk for further clinical deterioration. However it is anticipated that the patient may be medically stable for discharge from the hospital within 24 to 48 hours.    James Blue MD [facility]   How to contact the [facility] Attending or Consulting provider 7A - 7P or covering provider during after hours 7P -7A, for this patient?  Check the care team in [facility] and look for a) attending/consulting [facility] provider listed and b) the [facility] team listed Log into [email or web address] and use [facility]'s universal password to access. If you do not have the password, please contact the hospital operator. Locate the [facility] provider you are looking for under [facility] and page to a number that you can be directly reached. If you still have difficulty reaching the provider, please page the [facility] (Director on Call) for the Hospitalists listed on amion for assistance.   09/28/2020, 7:15 PM

## 2020-09-28 NOTE — ED Notes (Signed)
Pt restless, states that if he does not get a bed soon he will take out his IV and leave. Pt made aware that we do not know when he will get a bed. Pt states he is uncomfortable. Pt attempted to make more comfortable.

## 2020-09-29 LAB — GLUCOSE, CAPILLARY
Glucose-Capillary: 188 mg/dL — ABNORMAL HIGH (ref 70–99)
Glucose-Capillary: 191 mg/dL — ABNORMAL HIGH (ref 70–99)
Glucose-Capillary: 139 mg/dL — ABNORMAL HIGH (ref 70–99)
Glucose-Capillary: 91 mg/dL (ref 70–99)

## 2020-09-29 LAB — CBC
HCT: 37.1 % — ABNORMAL LOW (ref 39.0–52.0)
Hemoglobin: 12.6 g/dL — ABNORMAL LOW (ref 13.0–17.0)
MCH: 31.6 pg (ref 26.0–34.0)
MCHC: 34 g/dL (ref 30.0–36.0)
MCV: 93 fL (ref 80.0–100.0)
Platelets: 240 10*3/uL (ref 150–400)
RBC: 3.99 MIL/uL — ABNORMAL LOW (ref 4.22–5.81)
RDW: 12 % (ref 11.5–15.5)
WBC: 6.3 10*3/uL (ref 4.0–10.5)
nRBC: 0 % (ref 0.0–0.2)

## 2020-09-29 LAB — BASIC METABOLIC PANEL WITH GFR
Anion gap: 14 (ref 5–15)
BUN: 33 mg/dL — ABNORMAL HIGH (ref 6–20)
CO2: 21 mmol/L — ABNORMAL LOW (ref 22–32)
Calcium: 9.4 mg/dL (ref 8.9–10.3)
Chloride: 95 mmol/L — ABNORMAL LOW (ref 98–111)
Creatinine, Ser: 1.82 mg/dL — ABNORMAL HIGH (ref 0.61–1.24)
GFR, Estimated: 44 mL/min — ABNORMAL LOW
Glucose, Bld: 190 mg/dL — ABNORMAL HIGH (ref 70–99)
Potassium: 4.5 mmol/L (ref 3.5–5.1)
Sodium: 130 mmol/L — ABNORMAL LOW (ref 135–145)

## 2020-09-29 MED ORDER — PANTOPRAZOLE SODIUM 40 MG IV SOLR
40.0000 mg | Freq: Two times a day (BID) | INTRAVENOUS | Status: DC
  Administered 2020-09-29: 40 mg via INTRAVENOUS
  Filled 2020-09-29: qty 40

## 2020-09-29 MED ORDER — AMLODIPINE BESYLATE 5 MG PO TABS
5.0000 mg | ORAL_TABLET | Freq: Every day | ORAL | Status: DC
  Administered 2020-09-29: 5 mg via ORAL
  Filled 2020-09-29: qty 1

## 2020-09-29 MED ORDER — METOCLOPRAMIDE HCL 5 MG/ML IJ SOLN
5.0000 mg | Freq: Three times a day (TID) | INTRAMUSCULAR | Status: DC
  Filled 2020-09-29: qty 2

## 2020-09-29 MED ORDER — DIPHENHYDRAMINE HCL 25 MG PO CAPS
25.0000 mg | ORAL_CAPSULE | Freq: Once | ORAL | Status: AC
  Administered 2020-09-29: 25 mg via ORAL
  Filled 2020-09-29: qty 1

## 2020-09-29 NOTE — Progress Notes (Signed)
PROGRESS NOTE    James Bradford  JSE:[id number] DOB: 30-Oct-1965 DOA: 09/28/2020 PCP: Medicine, [facility]   Brief Narrative: 55 year old with past medical history significant for hypertension, diabetes, hyperlipidemia, stage IIIb CKD who presented with nausea and vomiting.  He has not been able to keep anything down, since 4 days prior to admission.  He has uncontrolled blood sugar with lows and highs.  Patient was admitted a month ago with similar presentation.   Assessment & Plan:   Principal Problem:   Intractable nausea and vomiting Active Problems:   Uncontrolled type 2 diabetes mellitus (HCC)   Essential hypertension   Hyperlipidemia   Stage 3 chronic kidney disease (HCC)   AKI (acute kidney injury) (HCC)   Marijuana abuse  1-Intractable nausea and vomiting: -This could be  related to gastroparesis, he will will need gastric emptying study at some point.  -Continue with IV fluids, change Reglan to IV. -He reported  vomiting this morning. -Start IV protonix.  -Needs counseling in regards THC.   2-Uncontrolled diabetes: hyperglycemia, hypoglycemia -A1c: 7.4 improved from 16 a year ago. -Continue to hold Glucophage and Glucotrol -Might need to stop Glucophage at discharge depending on kidney function.   3-Hypertension: Holding lisinopril in the setting of AKI. Plan to start low-dose Norvasc.  4-Hyperlipidemia: Continue with Pravachol  5-AKI on CKD stage IIIb: Continue baseline 1.5 Peaked to 3.8 Plan to continue with IV fluids, still vomiting. continue to hold ACE. Creatinine down to 1.8.  6-Hyponatremia: Continue with IV fluids.     Nutrition Problem: Inadequate oral intake Etiology: inability to eat    Signs/Symptoms: NPO status    Interventions: Refer to RD note for recommendations  Estimated body mass index is 24.28 kg/m as calculated from the following:   Height as of this encounter: 5\' 7"  (1.702 m).   Weight as of this  encounter: 70.3 kg.   DVT prophylaxis: Lovenox Code Status: Full code Family Communication: Care discussed with patient Disposition Plan:  Status is: Observation  The patient remains OBS appropriate and will d/c before 2 midnights.  Dispo: The patient is from: Home              Anticipated d/c is to: Home              Patient currently is not medically stable to d/c.  Patient is still vomiting, not tolerating oral intake.   Difficult to place patient No        Consultants:  None  Procedures:  None  Antimicrobials:    Subjective: Is not feeling well, he report vomiting this morning around 8 AM.  He reports epigastric pain, he is complaining of headache, he is asking for Benadryl. He has lost weight unintentional.  He has not had his colonoscopy, we discussed about the importance of getting his colonoscopy.  Objective: Vitals:   09/29/20 0012 09/29/20 0516 09/29/20 0747 09/29/20 1109  BP: (!) 157/97 (!) 144/94 122/87 (!) 157/93  Pulse: 98 (!) 106 (!) 107 83  Resp:   18 18  Temp: 99.7 F (37.6 C) 99.5 F (37.5 C) 98.5 F (36.9 C) 98.5 F (36.9 C)  TempSrc: Oral Oral Oral Oral  SpO2: 100% 100% 100% 100%  Weight:      Height:        Intake/Output Summary (Last 24 hours) at 09/29/2020 1202 Last data filed at 09/29/2020 0900 Gross per 24 hour  Intake 1104.55 ml  Output --  Net 1104.55 ml   10/01/2020  09/28/20 0300  Weight: 70.3 kg    Examination:  General exam: Appears calm and comfortable  Respiratory system: Clear to auscultation. Respiratory effort normal. Cardiovascular system: S1 & S2 heard, RRR. No JVD, murmurs, rubs, gallops or clicks. No pedal edema. Gastrointestinal system: Abdomen is nondistended, soft and nontender. No organomegaly or masses felt. Normal bowel sounds heard. Central nervous system: Alert and oriented. No focal neurological deficits. Extremities: Symmetric 5 x 5 power. Skin: No rashes, lesions or ulcers Psychiatry:  Judgement and insight appear normal. Mood & affect appropriate.     Data Reviewed: I have personally reviewed following labs and imaging studies  CBC: Recent Labs  Lab 09/28/20 0330 09/29/20 0150  WBC 8.0 6.3  HGB 14.5 12.6*  HCT 41.5 37.1*  MCV 91.4 93.0  PLT 303 240   Basic Metabolic Panel: Recent Labs  Lab 09/28/20 0330 09/29/20 0150  NA 129* 130*  K 4.1 4.5  CL 90* 95*  CO2 20* 21*  GLUCOSE 331* 190*  BUN 52* 33*  CREATININE 3.84* 1.82*  CALCIUM 9.9 9.4   GFR: Estimated Creatinine Clearance: 43.9 mL/min (A) (by C-G formula based on SCr of 1.82 mg/dL (H)). Liver Function Tests: Recent Labs  Lab 09/28/20 0330  AST 34  ALT 25  ALKPHOS 60  BILITOT 1.4*  PROT 7.9  ALBUMIN 4.8   Recent Labs  Lab 09/28/20 0330  LIPASE 48   No results for input(s): AMMONIA in the last 168 hours. Coagulation Profile: No results for input(s): INR, PROTIME in the last 168 hours. Cardiac Enzymes: No results for input(s): CKTOTAL, CKMB, CKMBINDEX, TROPONINI in the last 168 hours. BNP (last 3 results) No results for input(s): PROBNP in the last 8760 hours. HbA1C: Recent Labs    09/28/20 0330  HGBA1C 7.4*   CBG: Recent Labs  Lab 09/28/20 1713 09/28/20 2055 09/29/20 0533 09/29/20 0749 09/29/20 1107  GLUCAP 94 176* 188* 191* 139*   Lipid Profile: No results for input(s): CHOL, HDL, LDLCALC, TRIG, CHOLHDL, LDLDIRECT in the last 72 hours. Thyroid Function Tests: No results for input(s): TSH, T4TOTAL, FREET4, T3FREE, THYROIDAB in the last 72 hours. Anemia Panel: No results for input(s): VITAMINB12, FOLATE, FERRITIN, TIBC, IRON, RETICCTPCT in the last 72 hours. Sepsis Labs: No results for input(s): PROCALCITON, LATICACIDVEN in the last 168 hours.  Recent Results (from the past 240 hour(s))  Resp Panel by RT-PCR (Flu A&B, Covid) Nasopharyngeal Swab     Status: None   Collection Time: 09/28/20  5:17 AM   Specimen: Nasopharyngeal Swab; Nasopharyngeal(NP) swabs in vial  transport medium  Result Value Ref Range Status   SARS Coronavirus 2 by RT PCR NEGATIVE NEGATIVE Final    Comment: (NOTE) SARS-CoV-2 target nucleic acids are NOT DETECTED.  The SARS-CoV-2 RNA is generally detectable in upper respiratory specimens during the acute phase of infection. The lowest concentration of SARS-CoV-2 viral copies this assay can detect is 138 copies/mL. A negative result does not preclude SARS-Cov-2 infection and should not be used as the sole basis for treatment or other patient management decisions. A negative result may occur with  improper specimen collection/handling, submission of specimen other than nasopharyngeal swab, presence of viral mutation(s) within the areas targeted by this assay, and inadequate number of viral copies(<138 copies/mL). A negative result must be combined with clinical observations, patient history, and epidemiological information. The expected result is Negative.  Fact Sheet for Patients:  BloggerCourse.com  Fact Sheet for Healthcare Providers:  SeriousBroker.it  This test is no t yet approved  or cleared by the [place] and  has been authorized for detection and/or diagnosis of SARS-CoV-2 by FDA under an Emergency Use Authorization (EUA). This EUA will remain  in effect (meaning this test can be used) for the duration of the COVID-19 declaration under Section 564(b)(1) of the Act, 21 U.S.C.section 360bbb-3(b)(1), unless the authorization is terminated  or revoked sooner.       Influenza A by PCR NEGATIVE NEGATIVE Final   Influenza B by PCR NEGATIVE NEGATIVE Final    Comment: (NOTE) The Xpert Xpress SARS-CoV-2/FLU/RSV plus assay is intended as an aid in the diagnosis of influenza from Nasopharyngeal swab specimens and should not be used as a sole basis for treatment. Nasal washings and aspirates are unacceptable for Xpert Xpress SARS-CoV-2/FLU/RSV testing.  Fact  Sheet for Patients: BloggerCourse.com  Fact Sheet for Healthcare Providers: SeriousBroker.it  This test is not yet approved or cleared by the [place] FDA and has been authorized for detection and/or diagnosis of SARS-CoV-2 by FDA under an Emergency Use Authorization (EUA). This EUA will remain in effect (meaning this test can be used) for the duration of the COVID-19 declaration under Section 564(b)(1) of the Act, 21 U.S.C. section 360bbb-3(b)(1), unless the authorization is terminated or revoked.  Performed at [facility], [address], [place], [place] [address]          Radiology Studies: No results found.      Scheduled Meds:  dicyclomine  20 mg Oral BID   docusate sodium  100 mg Oral BID   enoxaparin (LOVENOX) injection  30 mg Subcutaneous Q24H   insulin aspart  0-5 Units Subcutaneous QHS   insulin aspart  0-9 Units Subcutaneous TID WC   insulin aspart  3 Units Subcutaneous TID WC   metoCLOPramide (REGLAN) injection  5 mg Intravenous Q8H   pantoprazole (PROTONIX) IV  40 mg Intravenous Q12H   pravastatin  10 mg Oral Daily   Continuous Infusions:  chlorproMAZINE (THORAZINE) IV     lactated ringers 75 mL/hr at 09/29/20 0940     LOS: 0 days    Time spent: 35 minutes.     Alba Cory, MD [facility]   If 7PM-7AM, please contact night-coverage [email or web address]  09/29/2020, 12:02 PM

## 2020-09-29 NOTE — Progress Notes (Addendum)
During shift change. Pt was already upset. Pt said he wanted to see a doctor. Educated pt about plan of care. Informed pt we could page the on call MD. Pt insisted to leave. Pt left AMA regardless of education. Notified MD on call. Pt refused to sign the AMA form, notes added to form and attached to chart.

## 2020-09-29 NOTE — Progress Notes (Signed)
Patient stated that he felt that his blood sugar was low it was checked 188 he c/o 8/10 headache and was given 650 mg og Tylenol po. Will continue to monitor. Ilean Skill LPN

## 2020-09-30 NOTE — Discharge Summary (Signed)
Physician Discharge Summary  James Bradford ZES:[id number] DOB: [birth year] DOA: 09/28/2020  PCP: [facility], [facility]  Admit date: 09/28/2020 Discharge date: 09/30/2020  Admitted From: Home  Disposition:  Left AMA   Recommendations for Outpatient Follow-up:  Needs gastric empty study/ endoscopy     Brief/Interim Summary: 55 year old with past medical history significant for hypertension, diabetes, hyperlipidemia, stage IIIb CKD who presented with nausea and vomiting.  He has not been able to keep anything down, since 4 days prior to admission.  He has uncontrolled blood sugar with lows and highs.   Patient was admitted a month ago with similar presentation.    1-Intractable nausea and vomiting: -This could be  related to gastroparesis, he will will need gastric emptying study at some point.  -Continue with IV fluids, change Reglan to IV. -He reported  vomiting this morning. -Start IV protonix.  -Needs counseling in regards THC.    Patient left AMA overnight.   2-Uncontrolled diabetes: hyperglycemia, hypoglycemia -A1c: 7.4 improved from 16 a year ago. -Continue to hold Glucophage and Glucotrol -Might need to stop Glucophage at discharge depending on kidney function.     3-Hypertension: Holding lisinopril in the setting of AKI. Plan to start low-dose Norvasc.   4-Hyperlipidemia: Continue with Pravachol   5-AKI on CKD stage IIIb: Continue baseline 1.5 Peaked to 3.8 Plan to continue with IV fluids, still vomiting. continue to hold ACE. Creatinine down to 1.8.   6-Hyponatremia: Continue with IV fluids.         Nutrition Problem: Inadequate oral intake Etiology: inability to eat      Discharge Diagnoses:  Principal Problem:   Intractable nausea and vomiting Active Problems:   Uncontrolled type 2 diabetes mellitus (HCC)   Essential hypertension   Hyperlipidemia   Stage 3 chronic kidney disease (HCC)   AKI (acute kidney injury) (HCC)   Marijuana  abuse    Discharge Instructions   Allergies as of 09/29/2020       Reactions   Ibuprofen Other (See Comments)   Hurts his stomach   Aspirin Other (See Comments)   Stomach irritation        Medication List     ASK your doctor about these medications    Accu-Chek Guide test strip Generic drug: glucose blood Use as instructed   Accu-Chek Softclix Lancets lancets Use as instructed   dicyclomine 20 MG tablet Commonly known as: BENTYL Take 1 tablet (20 mg total) by mouth 2 (two) times daily.   glipiZIDE 10 MG tablet Commonly known as: GLUCOTROL Take 10 mg by mouth 2 (two) times daily.   KRILL OIL PO Take 1 capsule by mouth daily.   lisinopril 10 MG tablet Commonly known as: ZESTRIL Take 1 tablet (10 mg total) by mouth daily.   metFORMIN 1000 MG tablet Commonly known as: GLUCOPHAGE Take 1 tablet (1,000 mg total) by mouth 2 (two) times daily with a meal.   MULTIVITAMIN ADULT PO Take 1 tablet by mouth daily.   pravastatin 10 MG tablet Commonly known as: PRAVACHOL Take 1 tablet (10 mg total) by mouth daily.   sildenafil 100 MG tablet Commonly known as: VIAGRA Take 100 mg by mouth daily as needed for erectile dysfunction.        Allergies  Allergen Reactions   Ibuprofen Other (See Comments)    Hurts his stomach    Aspirin Other (See Comments)    Stomach irritation    Consultations: None  Procedures/Studies: No results found.    Discharge  Exam: Vitals:   09/29/20 1109 09/29/20 1612  BP: (!) 157/93 (!) 156/90  Pulse: 83 94  Resp: 18 18  Temp: 98.5 F (36.9 C) 99.5 F (37.5 C)  SpO2: 100% 100%        The results of significant diagnostics from this hospitalization (including imaging, microbiology, ancillary and laboratory) are listed below for reference.     Microbiology: Recent Results (from the past 240 hour(s))  Resp Panel by RT-PCR (Flu A&B, Covid) Nasopharyngeal Swab     Status: None   Collection Time: 09/28/20  5:17 AM    Specimen: Nasopharyngeal Swab; Nasopharyngeal(NP) swabs in vial transport medium  Result Value Ref Range Status   SARS Coronavirus 2 by RT PCR NEGATIVE NEGATIVE Final    Comment: (NOTE) SARS-CoV-2 target nucleic acids are NOT DETECTED.  The SARS-CoV-2 RNA is generally detectable in upper respiratory specimens during the acute phase of infection. The lowest concentration of SARS-CoV-2 viral copies this assay can detect is 138 copies/mL. A negative result does not preclude SARS-Cov-2 infection and should not be used as the sole basis for treatment or other patient management decisions. A negative result may occur with  improper specimen collection/handling, submission of specimen other than nasopharyngeal swab, presence of viral mutation(s) within the areas targeted by this assay, and inadequate number of viral copies(<138 copies/mL). A negative result must be combined with clinical observations, patient history, and epidemiological information. The expected result is Negative.  Fact Sheet for Patients:  BloggerCourse.com  Fact Sheet for Healthcare Providers:  SeriousBroker.it  This test is no t yet approved or cleared by the [place] FDA and  has been authorized for detection and/or diagnosis of SARS-CoV-2 by FDA under an Emergency Use Authorization (EUA). This EUA will remain  in effect (meaning this test can be used) for the duration of the COVID-19 declaration under Section 564(b)(1) of the Act, 21 U.S.C.section 360bbb-3(b)(1), unless the authorization is terminated  or revoked sooner.       Influenza A by PCR NEGATIVE NEGATIVE Final   Influenza B by PCR NEGATIVE NEGATIVE Final    Comment: (NOTE) The Xpert Xpress SARS-CoV-2/FLU/RSV plus assay is intended as an aid in the diagnosis of influenza from Nasopharyngeal swab specimens and should not be used as a sole basis for treatment. Nasal washings and aspirates are  unacceptable for Xpert Xpress SARS-CoV-2/FLU/RSV testing.  Fact Sheet for Patients: BloggerCourse.com  Fact Sheet for Healthcare Providers: SeriousBroker.it  This test is not yet approved or cleared by the [place] FDA and has been authorized for detection and/or diagnosis of SARS-CoV-2 by FDA under an Emergency Use Authorization (EUA). This EUA will remain in effect (meaning this test can be used) for the duration of the COVID-19 declaration under Section 564(b)(1) of the Act, 21 U.S.C. section 360bbb-3(b)(1), unless the authorization is terminated or revoked.  Performed at [facility], [address] [place], [place] [address]      Labs: BNP (last 3 results) No results for input(s): BNP in the last 8760 hours. Basic Metabolic Panel: Recent Labs  Lab 09/28/20 0330 09/29/20 0150  NA 129* 130*  K 4.1 4.5  CL 90* 95*  CO2 20* 21*  GLUCOSE 331* 190*  BUN 52* 33*  CREATININE 3.84* 1.82*  CALCIUM 9.9 9.4   Liver Function Tests: Recent Labs  Lab 09/28/20 0330  AST 34  ALT 25  ALKPHOS 60  BILITOT 1.4*  PROT 7.9  ALBUMIN 4.8   Recent Labs  Lab 09/28/20 0330  LIPASE 48   No results for input(s): AMMONIA in the last 168 hours. CBC: Recent Labs  Lab 09/28/20 0330 09/29/20 0150  WBC 8.0 6.3  HGB 14.5 12.6*  HCT 41.5 37.1*  MCV 91.4 93.0  PLT 303 240   Cardiac Enzymes: No results for input(s): CKTOTAL, CKMB, CKMBINDEX, TROPONINI in the last 168 hours. BNP: Invalid input(s): POCBNP CBG: Recent Labs  Lab 09/28/20 2055 09/29/20 0533 09/29/20 0749 09/29/20 1107 09/29/20 1613  GLUCAP 176* 188* 191* 139* 91   D-Dimer No results for input(s): DDIMER in the last 72 hours. Hgb A1c Recent Labs    09/28/20 0330  HGBA1C 7.4*   Lipid Profile No results for input(s): CHOL, HDL, LDLCALC, TRIG, CHOLHDL, LDLDIRECT in the last 72 hours. Thyroid function studies No results for input(s): TSH,  T4TOTAL, T3FREE, THYROIDAB in the last 72 hours.  Invalid input(s): FREET3 Anemia work up No results for input(s): VITAMINB12, FOLATE, FERRITIN, TIBC, IRON, RETICCTPCT in the last 72 hours. Urinalysis    Component Value Date/Time   COLORURINE YELLOW 09/28/2020 1003   APPEARANCEUR CLEAR 09/28/2020 1003   LABSPEC 1.010 09/28/2020 1003   PHURINE 6.0 09/28/2020 1003   GLUCOSEU >=500 (A) 09/28/2020 1003   HGBUR MODERATE (A) 09/28/2020 1003   HGBUR negative 02/26/2009 1016   BILIRUBINUR NEGATIVE 09/28/2020 1003   KETONESUR 5 (A) 09/28/2020 1003   PROTEINUR NEGATIVE 09/28/2020 1003   UROBILINOGEN 0.2 05/13/2020 1050   NITRITE NEGATIVE 09/28/2020 1003   LEUKOCYTESUR NEGATIVE 09/28/2020 1003   Sepsis Labs Invalid input(s): PROCALCITONIN,  WBC,  LACTICIDVEN Microbiology Recent Results (from the past 240 hour(s))  Resp Panel by RT-PCR (Flu A&B, Covid) Nasopharyngeal Swab     Status: None   Collection Time: 09/28/20  5:17 AM   Specimen: Nasopharyngeal Swab; Nasopharyngeal(NP) swabs in vial transport medium  Result Value Ref Range Status   SARS Coronavirus 2 by RT PCR NEGATIVE NEGATIVE Final    Comment: (NOTE) SARS-CoV-2 target nucleic acids are NOT DETECTED.  The SARS-CoV-2 RNA is generally detectable in upper respiratory specimens during the acute phase of infection. The lowest concentration of SARS-CoV-2 viral copies this assay can detect is 138 copies/mL. A negative result does not preclude SARS-Cov-2 infection and should not be used as the sole basis for treatment or other patient management decisions. A negative result may occur with  improper specimen collection/handling, submission of specimen other than nasopharyngeal swab, presence of viral mutation(s) within the areas targeted by this assay, and inadequate number of viral copies(<138 copies/mL). A negative result must be combined with clinical observations, patient history, and epidemiological information. The expected  result is Negative.  Fact Sheet for Patients:  BloggerCourse.comhttps://www.fda.gov/media/152166/download  Fact Sheet for Healthcare Providers:  SeriousBroker.ithttps://www.fda.gov/media/152162/download  This test is no t yet approved or cleared by the [place] FDA and  has been authorized for detection and/or diagnosis of SARS-CoV-2 by FDA under an Emergency Use Authorization (EUA). This EUA will remain  in effect (meaning this test can be used) for the duration of the COVID-19 declaration under Section 564(b)(1) of the Act, 21 U.S.C.section 360bbb-3(b)(1), unless the authorization is terminated  or revoked sooner.       Influenza A by PCR NEGATIVE NEGATIVE Final   Influenza B by PCR NEGATIVE NEGATIVE Final    Comment: (NOTE) The Xpert Xpress SARS-CoV-2/FLU/RSV plus assay is intended as an aid in the diagnosis of influenza from Nasopharyngeal swab specimens and should not be used as a sole basis for treatment. Nasal washings and aspirates are  unacceptable for Xpert Xpress SARS-CoV-2/FLU/RSV testing.  Fact Sheet for Patients: BloggerCourse.com  Fact Sheet for Healthcare Providers: SeriousBroker.it  This test is not yet approved or cleared by the [place] FDA and has been authorized for detection and/or diagnosis of SARS-CoV-2 by FDA under an Emergency Use Authorization (EUA). This EUA will remain in effect (meaning this test can be used) for the duration of the COVID-19 declaration under Section 564(b)(1) of the Act, 21 U.S.C. section 360bbb-3(b)(1), unless the authorization is terminated or revoked.  Performed at [facility], [address], [place], [place] [address]      Time coordinating discharge: 40 minutes  SIGNED:   Alba Cory, MD  [facility]

## 2021-01-12 ENCOUNTER — Inpatient Hospital Stay (HOSPITAL_COMMUNITY)
Admission: EM | Admit: 2021-01-12 | Discharge: 2021-01-16 | DRG: 638 | Discharge status: Against Medical Advice | Payer: Self-pay | Attending: Internal Medicine | Admitting: Internal Medicine

## 2021-01-12 ENCOUNTER — Other Ambulatory Visit: Payer: Self-pay

## 2021-01-12 ENCOUNTER — Encounter (HOSPITAL_COMMUNITY): Payer: Self-pay | Admitting: Emergency Medicine

## 2021-01-12 ENCOUNTER — Emergency Department (HOSPITAL_COMMUNITY): Payer: Self-pay

## 2021-01-12 DIAGNOSIS — N1831 Chronic kidney disease, stage 3a: Secondary | ICD-10-CM | POA: Diagnosis present

## 2021-01-12 DIAGNOSIS — K3184 Gastroparesis: Secondary | ICD-10-CM | POA: Diagnosis present

## 2021-01-12 DIAGNOSIS — Z8249 Family history of ischemic heart disease and other diseases of the circulatory system: Secondary | ICD-10-CM

## 2021-01-12 DIAGNOSIS — I700 Atherosclerosis of aorta: Secondary | ICD-10-CM | POA: Diagnosis present

## 2021-01-12 DIAGNOSIS — E1143 Type 2 diabetes mellitus with diabetic autonomic (poly)neuropathy: Secondary | ICD-10-CM | POA: Diagnosis present

## 2021-01-12 DIAGNOSIS — Z20822 Contact with and (suspected) exposure to covid-19: Secondary | ICD-10-CM | POA: Diagnosis present

## 2021-01-12 DIAGNOSIS — Z5329 Procedure and treatment not carried out because of patient's decision for other reasons: Secondary | ICD-10-CM | POA: Diagnosis present

## 2021-01-12 DIAGNOSIS — Z79899 Other long term (current) drug therapy: Secondary | ICD-10-CM

## 2021-01-12 DIAGNOSIS — H409 Unspecified glaucoma: Secondary | ICD-10-CM | POA: Diagnosis present

## 2021-01-12 DIAGNOSIS — F1210 Cannabis abuse, uncomplicated: Secondary | ICD-10-CM | POA: Diagnosis present

## 2021-01-12 DIAGNOSIS — Z886 Allergy status to analgesic agent status: Secondary | ICD-10-CM

## 2021-01-12 DIAGNOSIS — E1110 Type 2 diabetes mellitus with ketoacidosis without coma: Principal | ICD-10-CM

## 2021-01-12 DIAGNOSIS — N179 Acute kidney failure, unspecified: Secondary | ICD-10-CM | POA: Diagnosis present

## 2021-01-12 DIAGNOSIS — R739 Hyperglycemia, unspecified: Secondary | ICD-10-CM | POA: Diagnosis present

## 2021-01-12 DIAGNOSIS — I129 Hypertensive chronic kidney disease with stage 1 through stage 4 chronic kidney disease, or unspecified chronic kidney disease: Secondary | ICD-10-CM | POA: Diagnosis present

## 2021-01-12 DIAGNOSIS — E1122 Type 2 diabetes mellitus with diabetic chronic kidney disease: Secondary | ICD-10-CM | POA: Diagnosis present

## 2021-01-12 DIAGNOSIS — Z800 Family history of malignant neoplasm of digestive organs: Secondary | ICD-10-CM

## 2021-01-12 DIAGNOSIS — E860 Dehydration: Secondary | ICD-10-CM | POA: Diagnosis present

## 2021-01-12 DIAGNOSIS — Z7984 Long term (current) use of oral hypoglycemic drugs: Secondary | ICD-10-CM

## 2021-01-12 DIAGNOSIS — N1832 Chronic kidney disease, stage 3b: Secondary | ICD-10-CM | POA: Diagnosis present

## 2021-01-12 DIAGNOSIS — E1165 Type 2 diabetes mellitus with hyperglycemia: Secondary | ICD-10-CM | POA: Diagnosis present

## 2021-01-12 DIAGNOSIS — D75839 Thrombocytosis, unspecified: Secondary | ICD-10-CM | POA: Diagnosis present

## 2021-01-12 DIAGNOSIS — E7800 Pure hypercholesterolemia, unspecified: Secondary | ICD-10-CM | POA: Diagnosis present

## 2021-01-12 LAB — I-STAT VENOUS BLOOD GAS, ED
Acid-base deficit: 2 mmol/L (ref 0.0–2.0)
Bicarbonate: 22.3 mmol/L (ref 20.0–28.0)
Calcium, Ion: 1.1 mmol/L — ABNORMAL LOW (ref 1.15–1.40)
HCT: 39 % (ref 39.0–52.0)
Hemoglobin: 13.3 g/dL (ref 13.0–17.0)
O2 Saturation: 79 %
Potassium: 3.9 mmol/L (ref 3.5–5.1)
Sodium: 133 mmol/L — ABNORMAL LOW (ref 135–145)
TCO2: 23 mmol/L (ref 22–32)
pCO2, Ven: 37.1 mmHg — ABNORMAL LOW (ref 44.0–60.0)
pH, Ven: 7.386 (ref 7.250–7.430)
pO2, Ven: 44 mmHg (ref 32.0–45.0)

## 2021-01-12 LAB — URINALYSIS, ROUTINE W REFLEX MICROSCOPIC
Bacteria, UA: NONE SEEN
Bilirubin Urine: NEGATIVE
Glucose, UA: >=500 mg/dL — AB
Ketones, ur: 20 mg/dL — AB
Leukocytes,Ua: NEGATIVE
Nitrite: NEGATIVE
Protein, ur: 100 mg/dL — AB
Specific Gravity, Urine: 1.018 (ref 1.005–1.030)
pH: 5 (ref 5.0–8.0)

## 2021-01-12 LAB — COMPREHENSIVE METABOLIC PANEL
ALT: 30 U/L (ref 0–44)
AST: 22 U/L (ref 15–41)
Albumin: 4.7 g/dL (ref 3.5–5.0)
Alkaline Phosphatase: 81 U/L (ref 38–126)
Anion gap: 22 — ABNORMAL HIGH (ref 5–15)
BUN: 52 mg/dL — ABNORMAL HIGH (ref 6–20)
CO2: 17 mmol/L — ABNORMAL LOW (ref 22–32)
Calcium: 10.1 mg/dL (ref 8.9–10.3)
Chloride: 87 mmol/L — ABNORMAL LOW (ref 98–111)
Creatinine, Ser: 3.47 mg/dL — ABNORMAL HIGH (ref 0.61–1.24)
GFR, Estimated: 20 mL/min — ABNORMAL LOW (ref >60)
Glucose, Bld: 508 mg/dL (ref 70–99)
Potassium: 4.1 mmol/L (ref 3.5–5.1)
Sodium: 126 mmol/L — ABNORMAL LOW (ref 135–145)
Total Bilirubin: 1.4 mg/dL — ABNORMAL HIGH (ref 0.3–1.2)
Total Protein: 8.4 g/dL — ABNORMAL HIGH (ref 6.5–8.1)

## 2021-01-12 LAB — CBC
HCT: 41.1 % (ref 39.0–52.0)
Hemoglobin: 13.7 g/dL (ref 13.0–17.0)
MCH: 30.9 pg (ref 26.0–34.0)
MCHC: 33.3 g/dL (ref 30.0–36.0)
MCV: 92.6 fL (ref 80.0–100.0)
Platelets: 537 10*3/uL — ABNORMAL HIGH (ref 150–400)
RBC: 4.44 MIL/uL (ref 4.22–5.81)
RDW: 12.5 % (ref 11.5–15.5)
WBC: 9.5 10*3/uL (ref 4.0–10.5)
nRBC: 0 % (ref 0.0–0.2)

## 2021-01-12 LAB — BASIC METABOLIC PANEL
Anion gap: 16 — ABNORMAL HIGH (ref 5–15)
Anion gap: 16 — ABNORMAL HIGH (ref 5–15)
BUN: 53 mg/dL — ABNORMAL HIGH (ref 6–20)
BUN: 51 mg/dL — ABNORMAL HIGH (ref 6–20)
CO2: 20 mmol/L — ABNORMAL LOW (ref 22–32)
CO2: 21 mmol/L — ABNORMAL LOW (ref 22–32)
Calcium: 9.6 mg/dL (ref 8.9–10.3)
Calcium: 9.1 mg/dL (ref 8.9–10.3)
Chloride: 95 mmol/L — ABNORMAL LOW (ref 98–111)
Chloride: 95 mmol/L — ABNORMAL LOW (ref 98–111)
Creatinine, Ser: 3.3 mg/dL — ABNORMAL HIGH (ref 0.61–1.24)
Creatinine, Ser: 2.82 mg/dL — ABNORMAL HIGH (ref 0.61–1.24)
GFR, Estimated: 21 mL/min — ABNORMAL LOW (ref >60)
GFR, Estimated: 26 mL/min — ABNORMAL LOW (ref >60)
Glucose, Bld: 206 mg/dL — ABNORMAL HIGH (ref 70–99)
Glucose, Bld: 211 mg/dL — ABNORMAL HIGH (ref 70–99)
Potassium: 4.3 mmol/L (ref 3.5–5.1)
Potassium: 4.2 mmol/L (ref 3.5–5.1)
Sodium: 131 mmol/L — ABNORMAL LOW (ref 135–145)
Sodium: 132 mmol/L — ABNORMAL LOW (ref 135–145)

## 2021-01-12 LAB — CBG MONITORING, ED
Glucose-Capillary: 508 mg/dL (ref 70–99)
Glucose-Capillary: 254 mg/dL — ABNORMAL HIGH (ref 70–99)
Glucose-Capillary: 252 mg/dL — ABNORMAL HIGH (ref 70–99)

## 2021-01-12 LAB — HEMOGLOBIN A1C
Hgb A1c MFr Bld: 9.6 % — ABNORMAL HIGH (ref 4.8–5.6)
Mean Plasma Glucose: 228.82 mg/dL

## 2021-01-12 LAB — RESP PANEL BY RT-PCR (FLU A&B, COVID) ARPGX2
Influenza A by PCR: NEGATIVE
Influenza B by PCR: NEGATIVE
SARS Coronavirus 2 by RT PCR: NEGATIVE

## 2021-01-12 LAB — LIPASE, BLOOD: Lipase: 73 U/L — ABNORMAL HIGH (ref 11–51)

## 2021-01-12 MED ORDER — INSULIN ASPART 100 UNIT/ML IV SOLN
10.0000 [IU] | Freq: Once | INTRAVENOUS | Status: AC
  Administered 2021-01-12: 10 [IU] via INTRAVENOUS

## 2021-01-12 MED ORDER — SODIUM CHLORIDE 0.9 % IV BOLUS
1000.0000 mL | Freq: Once | INTRAVENOUS | Status: AC
Start: 2021-01-12 — End: 2021-01-12
  Administered 2021-01-12: 1000 mL via INTRAVENOUS

## 2021-01-12 MED ORDER — ONDANSETRON 4 MG PO TBDP
4.0000 mg | ORAL_TABLET | Freq: Once | ORAL | Status: AC
  Administered 2021-01-12: 4 mg via ORAL
  Filled 2021-01-12: qty 1

## 2021-01-12 MED ORDER — INSULIN ASPART 100 UNIT/ML IJ SOLN
0.0000 [IU] | INTRAMUSCULAR | Status: DC
  Administered 2021-01-12: 5 [IU] via SUBCUTANEOUS
  Administered 2021-01-13 (×2): 3 [IU] via SUBCUTANEOUS
  Administered 2021-01-13: 2 [IU] via SUBCUTANEOUS
  Administered 2021-01-13: 3 [IU] via SUBCUTANEOUS
  Administered 2021-01-13: 2 [IU] via SUBCUTANEOUS
  Administered 2021-01-14 (×5): 3 [IU] via SUBCUTANEOUS
  Administered 2021-01-15: 5 [IU] via SUBCUTANEOUS
  Administered 2021-01-15: 1 [IU] via SUBCUTANEOUS
  Administered 2021-01-15 (×2): 3 [IU] via SUBCUTANEOUS
  Administered 2021-01-15: 7 [IU] via SUBCUTANEOUS
  Administered 2021-01-16: 05:00:00 5 [IU] via SUBCUTANEOUS

## 2021-01-12 MED ORDER — DIPHENHYDRAMINE HCL 25 MG PO CAPS
25.0000 mg | ORAL_CAPSULE | Freq: Once | ORAL | Status: AC | PRN
  Administered 2021-01-12: 25 mg via ORAL
  Filled 2021-01-12: qty 1

## 2021-01-12 MED ORDER — ONDANSETRON 4 MG PO TBDP
4.0000 mg | ORAL_TABLET | Freq: Four times a day (QID) | ORAL | Status: DC | PRN
  Administered 2021-01-12 – 2021-01-14 (×5): 4 mg via ORAL
  Filled 2021-01-12 (×5): qty 1

## 2021-01-12 MED ORDER — INSULIN GLARGINE-YFGN 100 UNIT/ML ~~LOC~~ SOLN
5.0000 [IU] | Freq: Every day | SUBCUTANEOUS | Status: DC
  Filled 2021-01-12 (×2): qty 0.05

## 2021-01-12 MED ORDER — ACETAMINOPHEN 325 MG PO TABS
650.0000 mg | ORAL_TABLET | Freq: Four times a day (QID) | ORAL | Status: DC | PRN
  Administered 2021-01-12 – 2021-01-14 (×3): 650 mg via ORAL
  Filled 2021-01-12 (×3): qty 2

## 2021-01-12 MED ORDER — ACETAMINOPHEN 650 MG RE SUPP: 650.0000 mg | Freq: Four times a day (QID) | RECTAL | Status: DC | PRN

## 2021-01-12 MED ORDER — PROCHLORPERAZINE EDISYLATE 10 MG/2ML IJ SOLN
5.0000 mg | Freq: Once | INTRAMUSCULAR | Status: AC
  Administered 2021-01-12: 5 mg via INTRAVENOUS
  Filled 2021-01-12: qty 2

## 2021-01-12 MED ORDER — SODIUM CHLORIDE 0.9 % IV BOLUS
1000.0000 mL | Freq: Once | INTRAVENOUS | Status: AC
  Administered 2021-01-12: 1000 mL via INTRAVENOUS

## 2021-01-12 MED ORDER — PRAVASTATIN SODIUM 10 MG PO TABS
10.0000 mg | ORAL_TABLET | Freq: Every day | ORAL | Status: DC
  Administered 2021-01-12 – 2021-01-15 (×3): 10 mg via ORAL
  Filled 2021-01-12 (×5): qty 1

## 2021-01-12 MED ORDER — SODIUM CHLORIDE 0.9 % IV SOLN: INTRAVENOUS | Status: AC

## 2021-01-12 NOTE — ED Notes (Signed)
Pt continuing to have episodes of emesis after compazine administered

## 2021-01-12 NOTE — ED Provider Notes (Signed)
Emergency Medicine Provider Triage Evaluation Note  James Bradford , a 55 y.o. male  was evaluated in triage.  Pt complains of nominal pain, nausea and vomiting for 1 week.  Patient states that his blood sugars have been elevated at home.  States that they have been in the 3-4 100s at home.  He states that he stopped taking his metformin and glipizide because he was on antibiotics.  He states that when he restarted his medications he started having abdominal pain and nausea.  He denies diarrhea.  He has had decreased p.o. intake.  He denies fevers.  Patient was brought in by EMS.  That her blood sugar was 298.  Review of Systems  Positive: See above Negative:  Physical Exam  BP (!) 159/123 (BP Location: Left Arm)   Pulse (!) 102   Temp 98.3 F (36.8 C) (Oral)   Resp 18   SpO2 100%  Gen:   Awake, no distress, hiccuping with intermittent heaving. Resp:  Normal effort  MSK:   Moves extremities without difficulty  Other:  Abdomen is flat, soft, tender to palpation.  Positive bowel sounds.  Medical Decision Making  Medically screening exam initiated at 2:09 PM.  Appropriate orders placed.  James Bradford was informed that the remainder of the evaluation will be completed by another provider, this initial triage assessment does not replace that evaluation, and the importance of remaining in the ED until their evaluation is complete.     James [place], PA-C 01/12/21 1410    Cheryll Cockayne, MD 01/12/21 [phone]

## 2021-01-12 NOTE — ED Provider Notes (Signed)
[facility] EMERGENCY DEPARTMENT Provider Note   CSN: [id number] Arrival date & time: 01/12/21  1031     History Chief Complaint  Patient presents with   Abdominal Pain   Vomiting    James Bradford is a 55 y.o. male.  Patient presents for complaint of feeling dehydrated, nausea vomiting abdominal pain symptoms ongoing for about a week.  Denies fevers denies cough denies diarrhea denies chest pain.  Patient is a diabetic but having a difficult time keeping down his medications due to the vomiting at home.      Past Medical History:  Diagnosis Date   Aortic atherosclerosis ([place]) 08/24/2020   Hyperlipidemia    Hypertension    Marijuana abuse 09/28/2020   Stage 3 chronic kidney disease ([place])    Type 2 diabetes mellitus ([place])    Unspecified glaucoma 02/18/2008   Qualifier: Diagnosis of  By: Hassell Done FNP, Tori Milks      Patient Active Problem List   Diagnosis Date Noted   DKA (diabetic ketoacidosis) ([place]) 01/12/2021   Intractable nausea and vomiting 09/28/2020   Marijuana abuse 09/28/2020   Sinus bradycardia 08/24/2020   Aortic atherosclerosis ([place]) 08/24/2020   AKI (acute kidney injury) ([place]) 08/23/2020   Type 2 diabetes mellitus ([place]) 08/23/2020   Normocytic anemia 08/23/2020   Type 2 diabetes mellitus with hyperosmolar hyperglycemic state (HHS) ([place]) 04/23/2019   Hyperlipidemia    Stage 3 chronic kidney disease ([place])    HYPERCHOLESTEROLEMIA 02/26/2009   ERECTILE DYSFUNCTION 04/27/2008   Unspecified glaucoma 02/18/2008   Essential hypertension 02/16/2008   Uncontrolled type 2 diabetes mellitus 02/03/2005    Past Surgical History:  Procedure Laterality Date   HAND SURGERY     LEG SURGERY         Family History  Problem Relation Age of Onset   Hypertension Mother    Pancreatic cancer Father     Social History   Tobacco Use   Smoking status: Never   Smokeless tobacco: Never  Substance Use Topics   Alcohol use: No   Drug use: No     Home Medications Prior to Admission medications   Medication Sig Start Date End Date Taking? Authorizing Provider  acetaminophen (TYLENOL) 500 MG tablet Take 500 mg by mouth every 6 (six) hours as needed for moderate pain.   Yes [provider]  dicyclomine (BENTYL) 20 MG tablet Take 1 tablet (20 mg total) by mouth 2 (two) times daily. 12/25/19  Yes Faustino Congress, NP  KRILL OIL PO Take 1 capsule by mouth daily.   Yes [provider]  lisinopril (ZESTRIL) 10 MG tablet Take 1 tablet (10 mg total) by mouth daily. 04/24/19  Yes Gifford Shave, MD  metFORMIN (GLUCOPHAGE) 1000 MG tablet Take 1 tablet (1,000 mg total) by mouth 2 (two) times daily with a meal. 04/24/19  Yes Gifford Shave, MD  Multiple Vitamin (MULTIVITAMIN ADULT PO) Take 1 tablet by mouth daily.   Yes [provider]  pravastatin (PRAVACHOL) 10 MG tablet Take 1 tablet (10 mg total) by mouth daily. 04/24/19  Yes Gifford Shave, MD  sildenafil (VIAGRA) 100 MG tablet Take 100 mg by mouth daily as needed for erectile dysfunction. 12/21/19  Yes [provider]  Accu-Chek Softclix Lancets lancets Use as instructed 04/24/19   Gifford Shave, MD  glipiZIDE (GLUCOTROL) 10 MG tablet Take 10 mg by mouth 2 (two) times daily. Patient not taking: Reported on 01/12/2021 08/03/20   [provider]  glucose blood (ACCU-CHEK GUIDE) test strip  Use as instructed 04/24/19   Gifford Shave, MD    Allergies    Ibuprofen and Aspirin  Review of Systems   Review of Systems  Constitutional:  Negative for fever.  HENT:  Negative for ear pain and sore throat.   Eyes:  Negative for pain.  Respiratory:  Negative for cough.   Cardiovascular:  Negative for chest pain.  Gastrointestinal:  Positive for abdominal pain.  Genitourinary:  Negative for flank pain.  Musculoskeletal:  Negative for back pain.  Skin:  Negative for color change and rash.  Neurological:  Negative for syncope.  All other systems  reviewed and are negative.  Physical Exam Updated Vital Signs BP 129/76   Pulse 100   Temp 98.3 F (36.8 C) (Oral)   Resp 16   SpO2 100%   Physical Exam Constitutional:      Appearance: He is well-developed.  HENT:     Head: Normocephalic.     Nose: Nose normal.  Eyes:     Extraocular Movements: Extraocular movements intact.  Cardiovascular:     Rate and Rhythm: Normal rate.  Pulmonary:     Effort: Pulmonary effort is normal.  Abdominal:     Tenderness: There is abdominal tenderness.     Comments: Mild diffuse generalized tenderness on abdominal exam.  No guarding or rebound.  Skin:    Coloration: Skin is not jaundiced.  Neurological:     Mental Status: He is alert. Mental status is at baseline.    ED Results / Procedures / Treatments   Labs (all labs ordered are listed, but only abnormal results are displayed) Labs Reviewed  LIPASE, BLOOD - Abnormal; Notable for the following components:      Result Value   Lipase 73 (*)    All other components within normal limits  COMPREHENSIVE METABOLIC PANEL - Abnormal; Notable for the following components:   Sodium 126 (*)    Chloride 87 (*)    CO2 17 (*)    Glucose, Bld 508 (*)    BUN 52 (*)    Creatinine, Ser 3.47 (*)    Total Protein 8.4 (*)    Total Bilirubin 1.4 (*)    GFR, Estimated 20 (*)    Anion gap 22 (*)    All other components within normal limits  CBC - Abnormal; Notable for the following components:   Platelets 537 (*)    All other components within normal limits  URINALYSIS, ROUTINE W REFLEX MICROSCOPIC - Abnormal; Notable for the following components:   APPearance HAZY (*)    Glucose, UA >=500 (*)    Hgb urine dipstick MODERATE (*)    Ketones, ur 20 (*)    Protein, ur 100 (*)    All other components within normal limits  BASIC METABOLIC PANEL - Abnormal; Notable for the following components:   Sodium 131 (*)    Chloride 95 (*)    CO2 20 (*)    Glucose, Bld 206 (*)    BUN 53 (*)    Creatinine,  Ser 3.30 (*)    GFR, Estimated 21 (*)    Anion gap 16 (*)    All other components within normal limits  CBG MONITORING, ED - Abnormal; Notable for the following components:   Glucose-Capillary 508 (*)    All other components within normal limits  CBG MONITORING, ED - Abnormal; Notable for the following components:   Glucose-Capillary 254 (*)    All other components within normal limits  I-STAT VENOUS BLOOD  GAS, ED - Abnormal; Notable for the following components:   pCO2, Ven 37.1 (*)    Sodium 133 (*)    Calcium, Ion 1.10 (*)    All other components within normal limits  RESP PANEL BY RT-PCR (FLU A&B, COVID) ARPGX2  BETA-HYDROXYBUTYRIC ACID  BASIC METABOLIC PANEL    EKG None  Radiology CT Abdomen Pelvis Wo Contrast  Result Date: 01/12/2021 CLINICAL DATA:  Nausea and vomiting for 1 week, abdominal pain EXAM: CT ABDOMEN AND PELVIS WITHOUT CONTRAST TECHNIQUE: Multidetector CT imaging of the abdomen and pelvis was performed following the standard protocol without IV contrast. Unenhanced CT was performed per clinician order. Lack of IV contrast limits sensitivity and specificity, especially for evaluation of abdominal/pelvic solid viscera. COMPARISON:  08/23/2020 FINDINGS: Lower chest: No acute pleural or parenchymal lung disease. Hepatobiliary: Unremarkable unenhanced appearance of the liver and gallbladder. Pancreas: Unremarkable unenhanced appearance. Spleen: Unremarkable unenhanced appearance. Adrenals/Urinary Tract: Stable right renal cysts. No urinary tract calculi or obstructive uropathy within either kidney. The adrenals and bladder are unremarkable. Stomach/Bowel: No bowel obstruction or ileus. The appendix, if still present, is not well visualized. There are no inflammatory changes to suggest appendicitis. No bowel wall thickening. Vascular/Lymphatic: Mild atherosclerosis of the left iliac vessels. No significant vascular finding on this unenhanced exam. No pathologic adenopathy.  Reproductive: Prostate is unremarkable. Other: No free fluid or free gas.  No abdominal wall hernia. Musculoskeletal: No acute or destructive bony lesions. Reconstructed images demonstrate no additional findings. IMPRESSION: 1. Unremarkable unenhanced CT of the abdomen and pelvis. Electronically Signed   By: Sharlet Salina M.D.   On: 01/12/2021 16:26    Procedures .Critical Care Performed by: Cheryll Cockayne, MD Authorized by: Cheryll Cockayne, MD   Critical care provider statement:    Critical care time (minutes):  40   Critical care time was exclusive of:  Separately billable procedures and treating other patients and teaching time   Critical care was necessary to treat or prevent imminent or life-threatening deterioration of the following conditions:  Metabolic crisis Comments:     Blood sugar greater than 500, anion gap elevation, bicarb 17 concern for early DKA.  Requiring IV insulin.   Medications Ordered in ED Medications  ondansetron (ZOFRAN-ODT) disintegrating tablet 4 mg (4 mg Oral Given 01/12/21 1416)  sodium chloride 0.9 % bolus 1,000 mL (0 mLs Intravenous Stopped 01/12/21 1838)  sodium chloride 0.9 % bolus 1,000 mL (0 mLs Intravenous Stopped 01/12/21 1733)  insulin aspart (novoLOG) injection 10 Units (10 Units Intravenous Given 01/12/21 1631)    ED Course  I have reviewed the triage vital signs and the nursing notes.  Pertinent labs & imaging results that were available during my care of the patient were reviewed by me and considered in my medical decision making (see chart for details).    MDM Rules/Calculators/A&P                           Labs show acute kidney injury creatinine elevated 3.4 with a baseline around 1.8.  Labs show elevated anion gap and bicarb of 17.  Patient's ABG does not show acidosis.  Patient aggressively resuscitated with 2 L bolus of IV fluids 10 units of insulin, subsequent chemistry appears much improved but has persistent acute kidney injury.   Patient also given Zofran with resolution of his vomiting.  Will be brought to the hospitalist team.  Final Clinical Impression(s) / ED Diagnoses Final diagnoses:  Hyperglycemia  Acute kidney injury [facility])  Dehydration    Rx / DC Orders ED Discharge Orders     None        Luna Fuse, MD 01/12/21 [phone]

## 2021-01-12 NOTE — ED Notes (Signed)
Pt called out reporting increased episoded of emesis and abdominal pain. Reports he was feeling better earlier and now is feeling the same as when he initially came in. MD paged.

## 2021-01-12 NOTE — H&P (Addendum)
History and Physical    James Bradford [id number] DOB: Oct 19, 1965 DOA: 01/12/2021  PCP: [facility] Patient coming from: Home  Chief Complaint: Vomiting, abdominal pain  HPI: James Bradford is a 55 y.o. male with medical history significant of hypertension, non-insulin-dependent type II diabetes, hyperlipidemia, hypertension, CKD stage IIIb, substance abuse presented to ED with complaints of nausea, vomiting, abdominal pain, and high blood glucose.  Slightly tachycardic on arrival to the ED.  Not febrile.  Labs showing no leukocytosis.  Platelet count 537k.  Sodium 126.  Blood glucose 508.  Bicarb 17, anion gap 22.  BUN 52, creatinine 3.4 (baseline 1.5-1.8).  T bili 1.4, remainder of LFTs normal.  Lipase 73.  COVID and influenza PCR negative.  VBG with pH 7.38.  UA with 20 ketones and no signs of infection.  Beta hydroxybutyric acid pending.  CT abdomen pelvis negative for acute finding. Patient was given NovoLog 10 units, Zofran, and 2 L normal saline boluses.  Patient reports 1 week history of nausea, vomiting, and generalized abdominal pain.  He has not been able to eat anything.  Not having diarrhea.  Does report smoking marijuana.  He was not able to take his home glipizide and metformin.  His blood sugar has been running high in the 300s to 400s.  States he has stopped vomiting since he has been in the emergency room.  Review of Systems:  All systems reviewed and apart from history of presenting illness, are negative.  Past Medical History:  Diagnosis Date   Aortic atherosclerosis ([place]) 08/24/2020   Hyperlipidemia    Hypertension    Marijuana abuse 09/28/2020   Stage 3 chronic kidney disease (James [facility])    Type 2 diabetes mellitus ([place])    Unspecified glaucoma 02/18/2008   Qualifier: Diagnosis of  By: Hassell Done FNP, Tori Milks      Past Surgical History:  Procedure Laterality Date   HAND SURGERY     LEG SURGERY       reports that he has never smoked. He has never  used smokeless tobacco. He reports that he does not drink alcohol and does not use drugs.  Allergies  Allergen Reactions   Ibuprofen Other (See Comments)    Hurts his stomach    Pork-Derived Products     Patient does not eat pork products 2/2 to religious beliefs. Wants to avoid all pork containing medications    Aspirin Other (See Comments)    Stomach irritation    Family History  Problem Relation Age of Onset   Hypertension Mother    Pancreatic cancer Father     Prior to Admission medications   Medication Sig Start Date End Date Taking? Authorizing Provider  acetaminophen (TYLENOL) 500 MG tablet Take 500 mg by mouth every 6 (six) hours as needed for moderate pain.   Yes [provider]  dicyclomine (BENTYL) 20 MG tablet Take 1 tablet (20 mg total) by mouth 2 (two) times daily. 12/25/19  Yes Faustino Congress, NP  KRILL OIL PO Take 1 capsule by mouth daily.   Yes [provider]  lisinopril (ZESTRIL) 10 MG tablet Take 1 tablet (10 mg total) by mouth daily. 04/24/19  Yes Gifford Shave, MD  metFORMIN (GLUCOPHAGE) 1000 MG tablet Take 1 tablet (1,000 mg total) by mouth 2 (two) times daily with a meal. 04/24/19  Yes Gifford Shave, MD  Multiple Vitamin (MULTIVITAMIN ADULT PO) Take 1 tablet by mouth daily.   Yes [provider]  pravastatin (PRAVACHOL) 10 MG tablet  Take 1 tablet (10 mg total) by mouth daily. 04/24/19  Yes Gifford Shave, MD  sildenafil (VIAGRA) 100 MG tablet Take 100 mg by mouth daily as needed for erectile dysfunction. 12/21/19  Yes [provider]  Accu-Chek Softclix Lancets lancets Use as instructed 04/24/19   Gifford Shave, MD  glipiZIDE (GLUCOTROL) 10 MG tablet Take 10 mg by mouth 2 (two) times daily. Patient not taking: Reported on 01/12/2021 08/03/20   [provider]  glucose blood (ACCU-CHEK GUIDE) test strip Use as instructed 04/24/19   Gifford Shave, MD    Physical Exam: Vitals:   01/12/21 1830 01/12/21  1845 01/12/21 1900 01/12/21 1930  BP: (!) 140/98 131/85 (!) 147/83 129/76  Pulse: (!) 106 99 88 100  Resp: 15 16 17 16   Temp:      TempSrc:      SpO2: 100% 100% 100% 100%    Physical Exam Constitutional:      General: He is not in acute distress. HENT:     Head: Normocephalic and atraumatic.  Eyes:     Extraocular Movements: Extraocular movements intact.     Conjunctiva/sclera: Conjunctivae normal.  Cardiovascular:     Rate and Rhythm: Regular rhythm. Tachycardia present.     Pulses: Normal pulses.  Pulmonary:     Effort: Pulmonary effort is normal. No respiratory distress.     Breath sounds: Normal breath sounds. No wheezing or rales.  Abdominal:     General: Bowel sounds are normal. There is no distension.     Palpations: Abdomen is soft.     Tenderness: There is abdominal tenderness. There is no guarding or rebound.     Comments: Generalized tenderness to palpation  Musculoskeletal:        General: No swelling or tenderness.     Cervical back: Normal range of motion and neck supple.  Skin:    General: Skin is warm and dry.  Neurological:     General: No focal deficit present.     Mental Status: He is alert and oriented to person, place, and time.     Labs on Admission: I have personally reviewed following labs and imaging studies  CBC: Recent Labs  Lab 01/12/21 1357 01/12/21 1741  WBC 9.5  --   HGB 13.7 13.3  HCT 41.1 39.0  MCV 92.6  --   PLT 537*  --    Basic Metabolic Panel: Recent Labs  Lab 01/12/21 1357 01/12/21 1730 01/12/21 1741 01/12/21 1949  NA 126* 131* 133* 132*  K 4.1 4.3 3.9 4.2  CL 87* 95*  --  95*  CO2 17* 20*  --  21*  GLUCOSE 508* 206*  --  211*  BUN 52* 53*  --  51*  CREATININE 3.47* 3.30*  --  2.82*  CALCIUM 10.1 9.6  --  9.1   GFR: CrCl cannot be calculated (Unknown ideal weight.). Liver Function Tests: Recent Labs  Lab 01/12/21 1357  AST 22  ALT 30  ALKPHOS 81  BILITOT 1.4*  PROT 8.4*  ALBUMIN 4.7   Recent Labs   Lab 01/12/21 1357  LIPASE 73*   No results for input(s): AMMONIA in the last 168 hours. Coagulation Profile: No results for input(s): INR, PROTIME in the last 168 hours. Cardiac Enzymes: No results for input(s): CKTOTAL, CKMB, CKMBINDEX, TROPONINI in the last 168 hours. BNP (last 3 results) No results for input(s): PROBNP in the last 8760 hours. HbA1C: Recent Labs    01/12/21 2200  HGBA1C 9.6*  CBG: Recent Labs  Lab 01/12/21 1426 01/12/21 1732 01/12/21 2200  GLUCAP 508* 254* 252*   Lipid Profile: No results for input(s): CHOL, HDL, LDLCALC, TRIG, CHOLHDL, LDLDIRECT in the last 72 hours. Thyroid Function Tests: No results for input(s): TSH, T4TOTAL, FREET4, T3FREE, THYROIDAB in the last 72 hours. Anemia Panel: No results for input(s): VITAMINB12, FOLATE, FERRITIN, TIBC, IRON, RETICCTPCT in the last 72 hours. Urine analysis:    Component Value Date/Time   COLORURINE YELLOW 01/12/2021 1833   APPEARANCEUR HAZY (A) 01/12/2021 1833   LABSPEC 1.018 01/12/2021 1833   PHURINE 5.0 01/12/2021 1833   GLUCOSEU >=500 (A) 01/12/2021 1833   HGBUR MODERATE (A) 01/12/2021 1833   HGBUR negative 02/26/2009 1016   BILIRUBINUR NEGATIVE 01/12/2021 1833   KETONESUR 20 (A) 01/12/2021 1833   PROTEINUR 100 (A) 01/12/2021 1833   UROBILINOGEN 0.2 05/13/2020 1050   NITRITE NEGATIVE 01/12/2021 1833   LEUKOCYTESUR NEGATIVE 01/12/2021 1833    Radiological Exams on Admission: CT Abdomen Pelvis Wo Contrast  Result Date: 01/12/2021 CLINICAL DATA:  Nausea and vomiting for 1 week, abdominal pain EXAM: CT ABDOMEN AND PELVIS WITHOUT CONTRAST TECHNIQUE: Multidetector CT imaging of the abdomen and pelvis was performed following the standard protocol without IV contrast. Unenhanced CT was performed per clinician order. Lack of IV contrast limits sensitivity and specificity, especially for evaluation of abdominal/pelvic solid viscera. COMPARISON:  08/23/2020 FINDINGS: Lower chest: No acute pleural or  parenchymal lung disease. Hepatobiliary: Unremarkable unenhanced appearance of the liver and gallbladder. Pancreas: Unremarkable unenhanced appearance. Spleen: Unremarkable unenhanced appearance. Adrenals/Urinary Tract: Stable right renal cysts. No urinary tract calculi or obstructive uropathy within either kidney. The adrenals and bladder are unremarkable. Stomach/Bowel: No bowel obstruction or ileus. The appendix, if still present, is not well visualized. There are no inflammatory changes to suggest appendicitis. No bowel wall thickening. Vascular/Lymphatic: Mild atherosclerosis of the left iliac vessels. No significant vascular finding on this unenhanced exam. No pathologic adenopathy. Reproductive: Prostate is unremarkable. Other: No free fluid or free gas.  No abdominal wall hernia. Musculoskeletal: No acute or destructive bony lesions. Reconstructed images demonstrate no additional findings. IMPRESSION: 1. Unremarkable unenhanced CT of the abdomen and pelvis. Electronically Signed   By: Sharlet Salina M.D.   On: 01/12/2021 16:26    Assessment/Plan Principal Problem:   Hyperglycemia Active Problems:   HYPERCHOLESTEROLEMIA   AKI (acute kidney injury) ([facility])   Marijuana abuse   DKA (diabetic ketoacidosis) ([facility])   Hyperglycemia DKA (resolved) Noninsulin-dependent type 2 diabetes He is on glipizide and metformin but was not able to take them this past week.  A1c 7.4 on 09/28/2020. Initial labs done in the ED concerning for early DKA -blood glucose above 500, bicarb 17, anion gap 22.  Beta hydroxybutyric acid still pending.  UA with 20 ketones.  VBG with normal pH.  Patient was given NovoLog 10 units and 2 L normal saline boluses in the ED.  Now significantly improved, repeat labs showing: Blood glucose 211, bicarb 21, anion gap 16.  Patient is no longer vomiting and able to tolerate p.o. intake.   -Check A1c.  Continue IV fluid hydration.  Start Semglee 5 units daily, sliding scale insulin sensitive  every 4 hours.  Hold home oral hypoglycemic agents at this time.  AKI on CKD stage IIIb Likely prerenal azotemia from dehydration. BUN 52, creatinine 3.4 (baseline 1.5-1.8).  Creatinine improved to 2.8 after IV fluid boluses. -Continue IV fluid hydration and avoid nephrotoxic agents.  Hold home lisinopril.  Repeat BMP in a.m.  Nausea, vomiting, abdominal pain He is no longer vomiting.  Bilirubin borderline elevated with normal transaminases and alkaline phosphatase.  No significant elevation of lipase.  CT abdomen pelvis negative for acute finding.  COVID and influenza PCR negative.  Symptoms likely related to ongoing marijuana use. -Symptomatic management  Mild thrombocytosis -Repeat CBC in a.m.  Hypertension Stable. -Lisinopril held due to AKI.  IV hydralazine prn.  Hyperlipidemia -Continue pravastatin  Marijuana use -Counseled to quit   Addendum 10:27 PM: Just now informed by RN that patient is vomiting again.  Hold Semglee and continue sliding scale insulin at this time.   DVT prophylaxis: No heparin/ pork derived products due to religious beliefs.  Fondaparinux contraindicated due to AKI/ low creatinine clearance.  SCDs ordered. Code Status: Full code Family Communication: No family available at this time Disposition Plan: Status is: Observation  The patient remains OBS appropriate and will d/c before 2 midnights.  Level of care: Level of care: Telemetry Medical  The medical decision making on this patient was of high complexity and the patient is at high risk for clinical deterioration, therefore this is a level 3 visit.  Shela Leff MD [facility]  If 7PM-7AM, please contact night-coverage [email or web address]  01/12/2021, 10:24 PM

## 2021-01-12 NOTE — ED Notes (Signed)
Patient transported to CT 

## 2021-01-12 NOTE — ED Notes (Signed)
Called x 2 NO answer 

## 2021-01-12 NOTE — ED Triage Notes (Signed)
Pt reports mid abd pain, nausea, and vomiting x 1 week.  Pt is a diabetic and believes it may be related to his PO diabetes meds.  Denies diarrhea.  States he saw PCP recently and they are working on making changes to his medication.

## 2021-01-12 NOTE — ED Triage Notes (Signed)
Pt to triage via [facility] from home.  C/o n/v x 1 week.  Pain only before he vomits.  Denies pain at present.  EMS- BP 122/90 Pulse 110 CBG 298

## 2021-01-13 DIAGNOSIS — E1110 Type 2 diabetes mellitus with ketoacidosis without coma: Secondary | ICD-10-CM | POA: Diagnosis present

## 2021-01-13 DIAGNOSIS — E1165 Type 2 diabetes mellitus with hyperglycemia: Secondary | ICD-10-CM | POA: Diagnosis present

## 2021-01-13 LAB — CBC
HCT: 35.5 % — ABNORMAL LOW (ref 39.0–52.0)
Hemoglobin: 12.3 g/dL — ABNORMAL LOW (ref 13.0–17.0)
MCH: 30.9 pg (ref 26.0–34.0)
MCHC: 34.6 g/dL (ref 30.0–36.0)
MCV: 89.2 fL (ref 80.0–100.0)
Platelets: 453 10*3/uL — ABNORMAL HIGH (ref 150–400)
RBC: 3.98 MIL/uL — ABNORMAL LOW (ref 4.22–5.81)
RDW: 12.3 % (ref 11.5–15.5)
WBC: 8 10*3/uL (ref 4.0–10.5)
nRBC: 0 % (ref 0.0–0.2)

## 2021-01-13 LAB — BASIC METABOLIC PANEL
Anion gap: 11 (ref 5–15)
BUN: 43 mg/dL — ABNORMAL HIGH (ref 6–20)
CO2: 24 mmol/L (ref 22–32)
Calcium: 9.3 mg/dL (ref 8.9–10.3)
Chloride: 98 mmol/L (ref 98–111)
Creatinine, Ser: 2.28 mg/dL — ABNORMAL HIGH (ref 0.61–1.24)
GFR, Estimated: 33 mL/min — ABNORMAL LOW (ref >60)
Glucose, Bld: 64 mg/dL — ABNORMAL LOW (ref 70–99)
Potassium: 3.5 mmol/L (ref 3.5–5.1)
Sodium: 133 mmol/L — ABNORMAL LOW (ref 135–145)

## 2021-01-13 LAB — CBG MONITORING, ED
Glucose-Capillary: 198 mg/dL — ABNORMAL HIGH (ref 70–99)
Glucose-Capillary: 210 mg/dL — ABNORMAL HIGH (ref 70–99)
Glucose-Capillary: 214 mg/dL — ABNORMAL HIGH (ref 70–99)
Glucose-Capillary: 97 mg/dL (ref 70–99)

## 2021-01-13 LAB — BETA-HYDROXYBUTYRIC ACID: Beta-Hydroxybutyric Acid: 0.48 mmol/L — ABNORMAL HIGH (ref 0.05–0.27)

## 2021-01-13 LAB — GLUCOSE, CAPILLARY
Glucose-Capillary: 193 mg/dL — ABNORMAL HIGH (ref 70–99)
Glucose-Capillary: 217 mg/dL — ABNORMAL HIGH (ref 70–99)

## 2021-01-13 MED ORDER — SODIUM CHLORIDE 0.9 % IV SOLN: INTRAVENOUS | Status: DC

## 2021-01-13 MED ORDER — DIPHENHYDRAMINE HCL 25 MG PO CAPS
25.0000 mg | ORAL_CAPSULE | Freq: Four times a day (QID) | ORAL | Status: AC | PRN
  Administered 2021-01-13: 25 mg via ORAL
  Filled 2021-01-13: qty 1

## 2021-01-13 MED ORDER — LABETALOL HCL 5 MG/ML IV SOLN
10.0000 mg | INTRAVENOUS | Status: DC | PRN
  Administered 2021-01-13 – 2021-01-14 (×2): 10 mg via INTRAVENOUS
  Filled 2021-01-13 (×2): qty 4

## 2021-01-13 MED ORDER — DIPHENHYDRAMINE HCL 25 MG PO CAPS
25.0000 mg | ORAL_CAPSULE | Freq: Four times a day (QID) | ORAL | Status: AC | PRN
  Administered 2021-01-13 – 2021-01-14 (×3): 25 mg via ORAL
  Filled 2021-01-13 (×3): qty 1

## 2021-01-13 MED ORDER — AMLODIPINE BESYLATE 5 MG PO TABS
5.0000 mg | ORAL_TABLET | Freq: Every day | ORAL | Status: DC
  Administered 2021-01-13 – 2021-01-15 (×3): 5 mg via ORAL
  Filled 2021-01-13 (×3): qty 1

## 2021-01-13 NOTE — ED Notes (Signed)
No needs from pt at this time. 

## 2021-01-13 NOTE — Progress Notes (Signed)
PROGRESS NOTE    James Bradford  WUJ:[id number] DOB: 12-15-1965 DOA: 01/12/2021 PCP: Medicine, [facility]    Brief Narrative:  55 year old gentleman with history of hypertension on lisinopril, non-insulin-dependent type 2 diabetes on Eliquis diet and metformin, hyperlipidemia on a statin, hypertension, CKD stage IIIb and substance abuse presented with 1 week of nausea, vomiting, abdominal pain and high blood glucose.  In the emergency room he was tachycardic.  Sodium 126.  Blood glucose 508.  Anion gap 22.  COVID-19 and influenza negative.  CT abdomen and pelvis without any abnormalities.  Treated with IV fluids and insulin.   Assessment & Plan:   Principal Problem:   Hyperglycemia Active Problems:   HYPERCHOLESTEROLEMIA   AKI (acute kidney injury) (HCC)   Marijuana abuse   DKA (diabetic ketoacidosis) (HCC)  Intractable nausea vomiting likely secondary to gastroparesis and possibly aggravated by marijuana use. Ketoacidosis, likely starvation ketosis. Acute kidney injury on CKD stage IIIb with baseline creatinine about 1.5-1.8. Type 2 diabetes with hyperglycemia, uncontrolled.  CT scan with no evidence of obstruction.  Continue IV fluids with isotonic saline today, continue symptomatic treatment with nausea medications.  Already stabilizing. Recheck kidney functions tomorrow morning. Hemoglobin A1c 9.4.  Currently without any oral intake.  We will keep on sliding scale insulin with ultimate plan to change to his oral hypoglycemics once his acute symptoms subside. Discussed about marijuana probably causing or aggravating the gastroparesis symptoms and patient is comfortable not using it.  Hyperlipidemia: On statin that is continued.  Essential hypertension: Blood pressures elevated.  Lisinopril discontinued due to acute kidney injury.  Start patient on amlodipine 5 mg daily.  Will start patient on as needed antihypertensives to keep blood pressures less than  180.   DVT prophylaxis: Place and maintain sequential compression device Start: 01/12/21 2220   Code Status: Full code Family Communication: None Disposition Plan: Status is: Observation  The patient will require care spanning > 2 midnights and should be moved to inpatient because: Significant electrolyte abnormalities, no reliable oral intake.  Hyperglycemia. Patient will need frequent monitoring, IV fluids.        Consultants:  None  Procedures:  None  Antimicrobials:  None   Subjective: Patient was seen and examined.  He describes some restlessness and persistent nausea.  He does have vague upper abdominal pain but nausea is more prominent than pain.  No other overnight events.  Blood pressure is elevated. He tells me that he continue to take lisinopril and metformin despite not eating well for last 1 week. No bowel movement for 1 week but patient tells me that he has not eaten anything since a week.  Objective: Vitals:   01/13/21 0251 01/13/21 0635 01/13/21 0742 01/13/21 1016  BP: 108/87 112/74 (!) 186/88 (!) 192/91  Pulse: (!) 104 78 (!) 103 90  Resp: 11 20 (!) 22 16  Temp:   98.4 F (36.9 C)   TempSrc:   Oral   SpO2: 100% 100% 100% 98%    Intake/Output Summary (Last 24 hours) at 01/13/2021 1152 Last data filed at 01/13/2021 1137 Gross per 24 hour  Intake 2098.9 ml  Output 500 ml  Net 1598.9 ml   There were no vitals filed for this visit.  Examination:  General exam: Appears comfortable and slightly anxious. Respiratory system: Clear to auscultation. Respiratory effort normal. Cardiovascular system: S1 & S2 heard, RRR. No JVD, murmurs, rubs, gallops or clicks. No pedal edema. Gastrointestinal system: Abdomen is nondistended, soft and nontender. No organomegaly  or masses felt. Normal bowel sounds heard. Central nervous system: Alert and oriented. No focal neurological deficits. Extremities: Symmetric 5 x 5 power. Skin: No rashes, lesions or  ulcers Psychiatry: Judgement and insight appear normal. Mood & affect appropriate.     Data Reviewed: I have personally reviewed following labs and imaging studies  CBC: Recent Labs  Lab 01/12/21 1357 01/12/21 1741 01/13/21 0200  WBC 9.5  --  8.0  HGB 13.7 13.3 12.3*  HCT 41.1 39.0 35.5*  MCV 92.6  --  89.2  PLT 537*  --  0000000*   Basic Metabolic Panel: Recent Labs  Lab 01/12/21 1357 01/12/21 1730 01/12/21 1741 01/12/21 1949 01/13/21 0200  NA 126* 131* 133* 132* 133*  K 4.1 4.3 3.9 4.2 3.5  CL 87* 95*  --  95* 98  CO2 17* 20*  --  21* 24  GLUCOSE 508* 206*  --  211* 64*  BUN 52* 53*  --  51* 43*  CREATININE 3.47* 3.30*  --  2.82* 2.28*  CALCIUM 10.1 9.6  --  9.1 9.3   GFR: CrCl cannot be calculated (Unknown ideal weight.). Liver Function Tests: Recent Labs  Lab 01/12/21 1357  AST 22  ALT 30  ALKPHOS 81  BILITOT 1.4*  PROT 8.4*  ALBUMIN 4.7   Recent Labs  Lab 01/12/21 1357  LIPASE 73*   No results for input(s): AMMONIA in the last 168 hours. Coagulation Profile: No results for input(s): INR, PROTIME in the last 168 hours. Cardiac Enzymes: No results for input(s): CKTOTAL, CKMB, CKMBINDEX, TROPONINI in the last 168 hours. BNP (last 3 results) No results for input(s): PROBNP in the last 8760 hours. HbA1C: Recent Labs    01/12/21 2200  HGBA1C 9.6*   CBG: Recent Labs  Lab 01/12/21 2200 01/13/21 0003 01/13/21 0351 01/13/21 0829 01/13/21 1134  GLUCAP 252* 210* 97 214* 198*   Lipid Profile: No results for input(s): CHOL, HDL, LDLCALC, TRIG, CHOLHDL, LDLDIRECT in the last 72 hours. Thyroid Function Tests: No results for input(s): TSH, T4TOTAL, FREET4, T3FREE, THYROIDAB in the last 72 hours. Anemia Panel: No results for input(s): VITAMINB12, FOLATE, FERRITIN, TIBC, IRON, RETICCTPCT in the last 72 hours. Sepsis Labs: No results for input(s): PROCALCITON, LATICACIDVEN in the last 168 hours.  Recent Results (from the past 240 hour(s))  Resp  Panel by RT-PCR (Flu A&B, Covid) Nasopharyngeal Swab     Status: None   Collection Time: 01/12/21  4:25 PM   Specimen: Nasopharyngeal Swab; Nasopharyngeal(NP) swabs in vial transport medium  Result Value Ref Range Status   SARS Coronavirus 2 by RT PCR NEGATIVE NEGATIVE Final    Comment: (NOTE) SARS-CoV-2 target nucleic acids are NOT DETECTED.  The SARS-CoV-2 RNA is generally detectable in upper respiratory specimens during the acute phase of infection. The lowest concentration of SARS-CoV-2 viral copies this assay can detect is 138 copies/mL. A negative result does not preclude SARS-Cov-2 infection and should not be used as the sole basis for treatment or other patient management decisions. A negative result may occur with  improper specimen collection/handling, submission of specimen other than nasopharyngeal swab, presence of viral mutation(s) within the areas targeted by this assay, and inadequate number of viral copies(<138 copies/mL). A negative result must be combined with clinical observations, patient history, and epidemiological information. The expected result is Negative.  Fact Sheet for Patients:  EntrepreneurPulse.com.au  Fact Sheet for Healthcare Providers:  IncredibleEmployment.be  This test is no t yet approved or cleared by the [place] FDA  and  has been authorized for detection and/or diagnosis of SARS-CoV-2 by FDA under an Emergency Use Authorization (EUA). This EUA will remain  in effect (meaning this test can be used) for the duration of the COVID-19 declaration under Section 564(b)(1) of the Act, 21 U.S.C.section 360bbb-3(b)(1), unless the authorization is terminated  or revoked sooner.       Influenza A by PCR NEGATIVE NEGATIVE Final   Influenza B by PCR NEGATIVE NEGATIVE Final    Comment: (NOTE) The Xpert Xpress SARS-CoV-2/FLU/RSV plus assay is intended as an aid in the diagnosis of influenza from Nasopharyngeal  swab specimens and should not be used as a sole basis for treatment. Nasal washings and aspirates are unacceptable for Xpert Xpress SARS-CoV-2/FLU/RSV testing.  Fact Sheet for Patients: EntrepreneurPulse.com.au  Fact Sheet for Healthcare Providers: IncredibleEmployment.be  This test is not yet approved or cleared by the [place] FDA and has been authorized for detection and/or diagnosis of SARS-CoV-2 by FDA under an Emergency Use Authorization (EUA). This EUA will remain in effect (meaning this test can be used) for the duration of the COVID-19 declaration under Section 564(b)(1) of the Act, 21 U.S.C. section 360bbb-3(b)(1), unless the authorization is terminated or revoked.  Performed at [facility], [place] [address] [place], [place] [address]          Radiology Studies: CT Abdomen Pelvis Wo Contrast  Result Date: 01/12/2021 CLINICAL DATA:  Nausea and vomiting for 1 week, abdominal pain EXAM: CT ABDOMEN AND PELVIS WITHOUT CONTRAST TECHNIQUE: Multidetector CT imaging of the abdomen and pelvis was performed following the standard protocol without IV contrast. Unenhanced CT was performed per clinician order. Lack of IV contrast limits sensitivity and specificity, especially for evaluation of abdominal/pelvic solid viscera. COMPARISON:  08/23/2020 FINDINGS: Lower chest: No acute pleural or parenchymal lung disease. Hepatobiliary: Unremarkable unenhanced appearance of the liver and gallbladder. Pancreas: Unremarkable unenhanced appearance. Spleen: Unremarkable unenhanced appearance. Adrenals/Urinary Tract: Stable right renal cysts. No urinary tract calculi or obstructive uropathy within either kidney. The adrenals and bladder are unremarkable. Stomach/Bowel: No bowel obstruction or ileus. The appendix, if still present, is not well visualized. There are no inflammatory changes to suggest appendicitis. No bowel wall thickening.  Vascular/Lymphatic: Mild atherosclerosis of the left iliac vessels. No significant vascular finding on this unenhanced exam. No pathologic adenopathy. Reproductive: Prostate is unremarkable. Other: No free fluid or free gas.  No abdominal wall hernia. Musculoskeletal: No acute or destructive bony lesions. Reconstructed images demonstrate no additional findings. IMPRESSION: 1. Unremarkable unenhanced CT of the abdomen and pelvis. Electronically Signed   By: Randa Ngo M.D.   On: 01/12/2021 16:26        Scheduled Meds:  amLODipine  5 mg Oral Daily   insulin aspart  0-9 Units Subcutaneous Q4H   pravastatin  10 mg Oral Daily   Continuous Infusions:   LOS: 0 days    Time spent: 30 minutes    Barb Merino, MD [facility] Pager [phone]

## 2021-01-14 LAB — GLUCOSE, CAPILLARY
Glucose-Capillary: 112 mg/dL — ABNORMAL HIGH (ref 70–99)
Glucose-Capillary: 140 mg/dL — ABNORMAL HIGH (ref 70–99)
Glucose-Capillary: 214 mg/dL — ABNORMAL HIGH (ref 70–99)
Glucose-Capillary: 220 mg/dL — ABNORMAL HIGH (ref 70–99)
Glucose-Capillary: 242 mg/dL — ABNORMAL HIGH (ref 70–99)
Glucose-Capillary: 246 mg/dL — ABNORMAL HIGH (ref 70–99)
Glucose-Capillary: 295 mg/dL — ABNORMAL HIGH (ref 70–99)

## 2021-01-14 MED ORDER — METOCLOPRAMIDE HCL 5 MG/ML IJ SOLN
10.0000 mg | Freq: Three times a day (TID) | INTRAMUSCULAR | Status: AC
  Administered 2021-01-15 (×3): 10 mg via INTRAVENOUS
  Filled 2021-01-14 (×3): qty 2

## 2021-01-14 MED ORDER — DIPHENHYDRAMINE HCL 25 MG PO CAPS
25.0000 mg | ORAL_CAPSULE | Freq: Once | ORAL | Status: AC
  Administered 2021-01-14: 25 mg via ORAL
  Filled 2021-01-14: qty 1

## 2021-01-14 MED ORDER — INSULIN GLARGINE-YFGN 100 UNIT/ML ~~LOC~~ SOLN
7.0000 [IU] | Freq: Every day | SUBCUTANEOUS | Status: DC
  Administered 2021-01-14 – 2021-01-15 (×2): 7 [IU] via SUBCUTANEOUS
  Filled 2021-01-14 (×3): qty 0.07

## 2021-01-14 MED ORDER — ONDANSETRON HCL 4 MG/2ML IJ SOLN
4.0000 mg | Freq: Four times a day (QID) | INTRAMUSCULAR | Status: DC | PRN
  Administered 2021-01-14: 4 mg via INTRAVENOUS
  Filled 2021-01-14: qty 2

## 2021-01-14 NOTE — Progress Notes (Signed)
Pt. Has not had any episode of vomiting this shift. Pt. Refused labs this morning.

## 2021-01-14 NOTE — Progress Notes (Signed)
PROGRESS NOTE    James Bradford  WFU:[id number] DOB: 08-02-1965 DOA: 01/12/2021 PCP: Medicine, [facility]    Brief Narrative:  55 year old gentleman with history of hypertension on lisinopril, non-insulin-dependent type 2 diabetes on Eliquis diet and metformin, hyperlipidemia on a statin, hypertension, CKD stage IIIb and substance abuse presented with 1 week of nausea, vomiting, abdominal pain and high blood glucose.  In the emergency room he was tachycardic.  Sodium 126.  Blood glucose 508.  Anion gap 22.  COVID-19 and influenza negative.  CT abdomen and pelvis without any abnormalities.  Treated with IV fluids and insulin.   Assessment & Plan:   Principal Problem:   Hyperglycemia Active Problems:   HYPERCHOLESTEROLEMIA   AKI (acute kidney injury) (HCC)   Marijuana abuse   DKA (diabetic ketoacidosis) (HCC)   Ketoacidosis, diabetic, type 2, no coma (HCC)  Intractable nausea vomiting likely secondary to gastroparesis and possibly aggravated by marijuana use. Ketoacidosis, likely starvation ketosis. -Patient still with significant symptoms.  Not tolerating any oral intake.  Probably underlying gastroparesis. -We will change to scheduled metoclopramide today.  Continue IV fluids.  Order for gastric emptying study today, however patient continues to have intractable nausea.  Hold off on the studies.   Acute kidney injury on CKD stage IIIb with baseline creatinine about 1.5-1.8.  Moderate to severe dehydration: Still not tolerating oral intake.  Continue maintenance IV fluid.  Discontinue lisinopril.  Type 2 diabetes with hyperglycemia, uncontrolled: Hemoglobin C 9.6.  Blood sugars mostly more than 150.  On glipizide and metformin at home. Still with no reliable intake.  Resume low-dose of long-acting insulin.  Keep on sliding scale insulin.  Will change to oral hypoglycemics once he has reliable oral intake.  Hyperlipidemia: On statin that is continued.  Essential  hypertension: Blood pressures elevated.  Lisinopril discontinued due to recurrent renal failure.  Start patient on amlodipine 5 mg daily.  Will start patient on as needed antihypertensives to keep blood pressures less than 180.   DVT prophylaxis: Place and maintain sequential compression device Start: 01/12/21 2220   Code Status: Full code Family Communication: None Disposition Plan: Status is: Inpatient.  Patient is still continues to have intractable nausea vomiting.  Not tolerating oral diet.  Needing IV fluids and nausea medications through IV.   Consultants:  None  Procedures:  None  Antimicrobials:  None   Subjective:  Patient seen and examined in the morning rounds.  When I went to see him, he was violently vomiting after trying some breakfast.  Denies any abdominal pain except it feels like it is pulling on muscles. He threw all the medications that he had taken.  Objective: Vitals:   01/14/21 0038 01/14/21 0446 01/14/21 0818 01/14/21 0854  BP: 129/86 (!) 173/94 (!) 202/106 (!) 180/98  Pulse: 96 (!) 102 (!) 114 83  Resp:  20 (!) 21 17  Temp:  99.1 F (37.3 C)  98.1 F (36.7 C)  TempSrc:    Oral  SpO2:  100% 100% 100%    Intake/Output Summary (Last 24 hours) at 01/14/2021 1318 Last data filed at 01/14/2021 0314 Gross per 24 hour  Intake 1336.67 ml  Output 400 ml  Net 936.67 ml   There were no vitals filed for this visit.  Examination:  General exam: Appears uncomfortable with intractable vomiting. Respiratory system: Clear to auscultation. Respiratory effort normal. Cardiovascular system: S1 & S2 heard, RRR. No JVD, murmurs, rubs, gallops or clicks. No pedal edema. Gastrointestinal system: Abdomen is nondistended,  soft and nontender. No organomegaly or masses felt. Normal bowel sounds heard. Central nervous system: Alert and oriented. No focal neurological deficits. Extremities: Symmetric 5 x 5 power. Skin: No rashes, lesions or ulcers Psychiatry:  Judgement and insight appear normal.  Anxious mood.    Data Reviewed: I have personally reviewed following labs and imaging studies  CBC: Recent Labs  Lab 01/12/21 1357 01/12/21 1741 01/13/21 0200  WBC 9.5  --  8.0  HGB 13.7 13.3 12.3*  HCT 41.1 39.0 35.5*  MCV 92.6  --  89.2  PLT 537*  --  0000000*   Basic Metabolic Panel: Recent Labs  Lab 01/12/21 1357 01/12/21 1730 01/12/21 1741 01/12/21 1949 01/13/21 0200  NA 126* 131* 133* 132* 133*  K 4.1 4.3 3.9 4.2 3.5  CL 87* 95*  --  95* 98  CO2 17* 20*  --  21* 24  GLUCOSE 508* 206*  --  211* 64*  BUN 52* 53*  --  51* 43*  CREATININE 3.47* 3.30*  --  2.82* 2.28*  CALCIUM 10.1 9.6  --  9.1 9.3   GFR: CrCl cannot be calculated (Unknown ideal weight.). Liver Function Tests: Recent Labs  Lab 01/12/21 1357  AST 22  ALT 30  ALKPHOS 81  BILITOT 1.4*  PROT 8.4*  ALBUMIN 4.7   Recent Labs  Lab 01/12/21 1357  LIPASE 73*   No results for input(s): AMMONIA in the last 168 hours. Coagulation Profile: No results for input(s): INR, PROTIME in the last 168 hours. Cardiac Enzymes: No results for input(s): CKTOTAL, CKMB, CKMBINDEX, TROPONINI in the last 168 hours. BNP (last 3 results) No results for input(s): PROBNP in the last 8760 hours. HbA1C: Recent Labs    01/12/21 2200  HGBA1C 9.6*   CBG: Recent Labs  Lab 01/13/21 2042 01/14/21 0001 01/14/21 0448 01/14/21 0853 01/14/21 1255  GLUCAP 217* 246* 220* 295* 214*   Lipid Profile: No results for input(s): CHOL, HDL, LDLCALC, TRIG, CHOLHDL, LDLDIRECT in the last 72 hours. Thyroid Function Tests: No results for input(s): TSH, T4TOTAL, FREET4, T3FREE, THYROIDAB in the last 72 hours. Anemia Panel: No results for input(s): VITAMINB12, FOLATE, FERRITIN, TIBC, IRON, RETICCTPCT in the last 72 hours. Sepsis Labs: No results for input(s): PROCALCITON, LATICACIDVEN in the last 168 hours.  Recent Results (from the past 240 hour(s))  Resp Panel by RT-PCR (Flu A&B, Covid)  Nasopharyngeal Swab     Status: None   Collection Time: 01/12/21  4:25 PM   Specimen: Nasopharyngeal Swab; Nasopharyngeal(NP) swabs in vial transport medium  Result Value Ref Range Status   SARS Coronavirus 2 by RT PCR NEGATIVE NEGATIVE Final    Comment: (NOTE) SARS-CoV-2 target nucleic acids are NOT DETECTED.  The SARS-CoV-2 RNA is generally detectable in upper respiratory specimens during the acute phase of infection. The lowest concentration of SARS-CoV-2 viral copies this assay can detect is 138 copies/mL. A negative result does not preclude SARS-Cov-2 infection and should not be used as the sole basis for treatment or other patient management decisions. A negative result may occur with  improper specimen collection/handling, submission of specimen other than nasopharyngeal swab, presence of viral mutation(s) within the areas targeted by this assay, and inadequate number of viral copies(<138 copies/mL). A negative result must be combined with clinical observations, patient history, and epidemiological information. The expected result is Negative.  Fact Sheet for Patients:  EntrepreneurPulse.com.au  Fact Sheet for Healthcare Providers:  IncredibleEmployment.be  This test is no t yet approved or cleared by the  [place] Architectural technologist and  has been authorized for detection and/or diagnosis of SARS-CoV-2 by FDA under an Print production planner (EUA). This EUA will remain  in effect (meaning this test can be used) for the duration of the COVID-19 declaration under Section 564(b)(1) of the Act, 21 U.S.C.section 360bbb-3(b)(1), unless the authorization is terminated  or revoked sooner.       Influenza A by PCR NEGATIVE NEGATIVE Final   Influenza B by PCR NEGATIVE NEGATIVE Final    Comment: (NOTE) The Xpert Xpress SARS-CoV-2/FLU/RSV plus assay is intended as an aid in the diagnosis of influenza from Nasopharyngeal swab specimens and should not be  used as a sole basis for treatment. Nasal washings and aspirates are unacceptable for Xpert Xpress SARS-CoV-2/FLU/RSV testing.  Fact Sheet for Patients: EntrepreneurPulse.com.au  Fact Sheet for Healthcare Providers: IncredibleEmployment.be  This test is not yet approved or cleared by the [place] FDA and has been authorized for detection and/or diagnosis of SARS-CoV-2 by FDA under an Emergency Use Authorization (EUA). This EUA will remain in effect (meaning this test can be used) for the duration of the COVID-19 declaration under Section 564(b)(1) of the Act, 21 U.S.C. section 360bbb-3(b)(1), unless the authorization is terminated or revoked.  Performed at [facility], [place] [address] [place], [place] [address]          Radiology Studies: CT Abdomen Pelvis Wo Contrast  Result Date: 01/12/2021 CLINICAL DATA:  Nausea and vomiting for 1 week, abdominal pain EXAM: CT ABDOMEN AND PELVIS WITHOUT CONTRAST TECHNIQUE: Multidetector CT imaging of the abdomen and pelvis was performed following the standard protocol without IV contrast. Unenhanced CT was performed per clinician order. Lack of IV contrast limits sensitivity and specificity, especially for evaluation of abdominal/pelvic solid viscera. COMPARISON:  08/23/2020 FINDINGS: Lower chest: No acute pleural or parenchymal lung disease. Hepatobiliary: Unremarkable unenhanced appearance of the liver and gallbladder. Pancreas: Unremarkable unenhanced appearance. Spleen: Unremarkable unenhanced appearance. Adrenals/Urinary Tract: Stable right renal cysts. No urinary tract calculi or obstructive uropathy within either kidney. The adrenals and bladder are unremarkable. Stomach/Bowel: No bowel obstruction or ileus. The appendix, if still present, is not well visualized. There are no inflammatory changes to suggest appendicitis. No bowel wall thickening. Vascular/Lymphatic: Mild atherosclerosis of the  left iliac vessels. No significant vascular finding on this unenhanced exam. No pathologic adenopathy. Reproductive: Prostate is unremarkable. Other: No free fluid or free gas.  No abdominal wall hernia. Musculoskeletal: No acute or destructive bony lesions. Reconstructed images demonstrate no additional findings. IMPRESSION: 1. Unremarkable unenhanced CT of the abdomen and pelvis. Electronically Signed   By: Randa Ngo M.D.   On: 01/12/2021 16:26        Scheduled Meds:  amLODipine  5 mg Oral Daily   insulin aspart  0-9 Units Subcutaneous Q4H   insulin glargine-yfgn  7 Units Subcutaneous QHS   metoCLOPramide (REGLAN) injection  10 mg Intravenous TID AC & HS   pravastatin  10 mg Oral Daily   Continuous Infusions:  sodium chloride 125 mL/hr at 01/13/21 1631     LOS: 1 day    Time spent: 30 minutes    Barb Merino, MD [facility] Pager [phone]

## 2021-01-14 NOTE — Progress Notes (Signed)
Mobility Specialist Progress Note   01/14/21 1445  Mobility  Activity Ambulated in hall  Level of Assistance Modified independent, requires aide device or extra time  Assistive Device  (IV Pole)  Distance Ambulated (ft) 570 ft  Mobility Ambulated independently in hallway  Mobility Response Tolerated well  Mobility performed by Mobility specialist  $Mobility charge 1 Mobility   Received pt in bed having no complaints and agreeable to mobility. Asymptomatic throughout ambulation, returned back to bed w/ call bell by side and all needs met.  Pt highly irritable today from previous treatment received from staff, charged RN came and checked on pt to resolve irritability.  Holland Falling Mobility Specialist Phone Number [phone]

## 2021-01-14 NOTE — Progress Notes (Signed)
  Transition of Care [facility]) Screening Note   Patient Details  Name: James Bradford Date of Birth: 26-Oct-1965   Transition of Care [facility]) CM/SW Contact:    Epifanio Lesches, RN Phone Number: [phone] 01/14/2021, 8:47 AM   Admitted with N/V/ abd pain / hyperglycemia. Resides with a friend. Independent with ADL's, , no DME usage. Confirmed [id number]. States has transportation to MD appointments. Pt without  concerns affording Rx meds.  Transition of Care Department [facility]) has reviewed patient and no TOC needs have been identified at this time. We will continue to monitor patient advancement through interdisciplinary progression rounds. If new patient transition needs arise, please place a TOC consult. Gae Gallop RN,BSN,CM

## 2021-01-14 NOTE — Progress Notes (Addendum)
Inpatient Diabetes Program Recommendations  AACE/ADA: New Consensus Statement on Inpatient Glycemic Control   Target Ranges:  Prepandial:   less than 140 mg/dL      Peak postprandial:   less than 180 mg/dL (1-2 hours)      Critically ill patients:  140 - 180 mg/dL    Latest Reference Range & Units 01/13/21 08:29 01/13/21 11:34 01/13/21 16:40 01/13/21 20:42 01/14/21 00:01 01/14/21 04:48 01/14/21 08:53  Glucose-Capillary 70 - 99 mg/dL 110 (H) 315 (H) 945 (H) 217 (H) 246 (H) 220 (H) 295 (H)    Latest Reference Range & Units 04/23/19 15:06 09/28/20 03:30 01/12/21 22:00  Hemoglobin A1C 4.8 - 5.6 % 16.8 (H) 7.4 (H) 9.6 (H)   Review of Glycemic Control  Diabetes history: DM2 Outpatient Diabetes medications: Metformin 1000 mg BID, Glipizide 10 mg BID Current orders for Inpatient glycemic control: Novolog 0-9 units Q4H  Inpatient Diabetes Program Recommendations:    Insulin: Please consider ordering Semglee 7 units Q24H.  Addendum 01/14/21@14 :50-Went by to talk with patient about DM control and outpatient DM medication regimen. Mobility tech in room with patient and patient appears irritated with loud voice and also states he is irritated. Patient is talking about his care last night with nurse and lab staff this morning and is upset about the care. Spoke with patient briefly about DM as he went back to talking about the care he has gotten at [facility] which is not happy with. Patient reported that he was on Metformin for DM and his doctor had him restart the Glipizide recently. Patient states that he has not been able to take any oral DM meds in last 2 days prior to admission. Patient states that he has never taken insulin but it was prescribed in the past but he was not able to afford it so he never started it. Patient states he does not want to have to start taking insulin. Patient discussion went back to his care. Asked mobility tech if they needed patient to do anything and they would like to  get him up to walk. Patient was agreeable to get up and walk. Informed patient that diabetes coordinator would plan to follow up with him tomorrow. Patient stated that hopefully he would be less irritated tomorrow when I came back. Will plan to talk with patient again tomorrow.  Thanks, Orlando Penner, RN, MSN, CDE Diabetes Coordinator Inpatient Diabetes Program [phone] (Team Pager from 8am to 5pm)

## 2021-01-14 NOTE — Plan of Care (Signed)
  Problem: Education: Goal: Knowledge of General Education information will improve Description: Including pain rating scale, medication(s)/side effects and non-pharmacologic comfort measures Outcome: Progressing   Problem: Health Behavior/Discharge Planning: Goal: Ability to manage health-related needs will improve Outcome: Progressing   Problem: Clinical Measurements: Goal: Will remain free from infection Outcome: Progressing Goal: Respiratory complications will improve Outcome: Progressing Goal: Cardiovascular complication will be avoided Outcome: Progressing   Problem: Activity: Goal: Risk for activity intolerance will decrease Outcome: Progressing   Problem: Nutrition: Goal: Adequate nutrition will be maintained Outcome: Progressing   Problem: Coping: Goal: Level of anxiety will decrease Outcome: Progressing   

## 2021-01-15 LAB — CBC WITH DIFFERENTIAL/PLATELET
Abs Immature Granulocytes: 0.02 10*3/uL (ref 0.00–0.07)
Basophils Absolute: 0 10*3/uL (ref 0.0–0.1)
Basophils Relative: 0 %
Eosinophils Absolute: 0.1 10*3/uL (ref 0.0–0.5)
Eosinophils Relative: 2 %
HCT: 30.6 % — ABNORMAL LOW (ref 39.0–52.0)
Hemoglobin: 10.6 g/dL — ABNORMAL LOW (ref 13.0–17.0)
Immature Granulocytes: 0 %
Lymphocytes Relative: 31 %
Lymphs Abs: 2.2 10*3/uL (ref 0.7–4.0)
MCH: 31.2 pg (ref 26.0–34.0)
MCHC: 34.6 g/dL (ref 30.0–36.0)
MCV: 90 fL (ref 80.0–100.0)
Monocytes Absolute: 0.7 10*3/uL (ref 0.1–1.0)
Monocytes Relative: 9 %
Neutro Abs: 4.1 10*3/uL (ref 1.7–7.7)
Neutrophils Relative %: 58 %
Platelets: 368 10*3/uL (ref 150–400)
RBC: 3.4 MIL/uL — ABNORMAL LOW (ref 4.22–5.81)
RDW: 12.4 % (ref 11.5–15.5)
WBC: 7.1 10*3/uL (ref 4.0–10.5)
nRBC: 0 % (ref 0.0–0.2)

## 2021-01-15 LAB — BASIC METABOLIC PANEL
Anion gap: 8 (ref 5–15)
BUN: 19 mg/dL (ref 6–20)
CO2: 23 mmol/L (ref 22–32)
Calcium: 8.3 mg/dL — ABNORMAL LOW (ref 8.9–10.3)
Chloride: 102 mmol/L (ref 98–111)
Creatinine, Ser: 1.46 mg/dL — ABNORMAL HIGH (ref 0.61–1.24)
GFR, Estimated: 57 mL/min — ABNORMAL LOW (ref >60)
Glucose, Bld: 223 mg/dL — ABNORMAL HIGH (ref 70–99)
Potassium: 3.5 mmol/L (ref 3.5–5.1)
Sodium: 133 mmol/L — ABNORMAL LOW (ref 135–145)

## 2021-01-15 LAB — MAGNESIUM: Magnesium: 1.6 mg/dL — ABNORMAL LOW (ref 1.7–2.4)

## 2021-01-15 LAB — GLUCOSE, CAPILLARY
Glucose-Capillary: 130 mg/dL — ABNORMAL HIGH (ref 70–99)
Glucose-Capillary: 209 mg/dL — ABNORMAL HIGH (ref 70–99)
Glucose-Capillary: 259 mg/dL — ABNORMAL HIGH (ref 70–99)
Glucose-Capillary: 290 mg/dL — ABNORMAL HIGH (ref 70–99)
Glucose-Capillary: 323 mg/dL — ABNORMAL HIGH (ref 70–99)
Glucose-Capillary: 35 mg/dL — CL (ref 70–99)
Glucose-Capillary: 40 mg/dL — CL (ref 70–99)

## 2021-01-15 MED ORDER — DIPHENHYDRAMINE HCL 25 MG PO CAPS
25.0000 mg | ORAL_CAPSULE | Freq: Once | ORAL | Status: AC
  Administered 2021-01-15: 25 mg via ORAL
  Filled 2021-01-15: qty 1

## 2021-01-15 MED ORDER — MAGNESIUM SULFATE 2 GM/50ML IV SOLN
2.0000 g | Freq: Once | INTRAVENOUS | Status: AC
  Administered 2021-01-15: 2 g via INTRAVENOUS
  Filled 2021-01-15: qty 50

## 2021-01-15 MED ORDER — GLUCOSE 4 G PO CHEW
CHEWABLE_TABLET | ORAL | Status: AC
  Filled 2021-01-15: qty 1

## 2021-01-15 NOTE — Progress Notes (Signed)
Inpatient Diabetes Program Recommendations  AACE/ADA: New Consensus Statement on Inpatient Glycemic Control   Target Ranges:  Prepandial:   less than 140 mg/dL      Peak postprandial:   less than 180 mg/dL (1-2 hours)      Critically ill patients:  140 - 180 mg/dL    Latest Reference Range & Units 01/14/21 08:53 01/14/21 12:55 01/14/21 15:57 01/14/21 18:56 01/14/21 20:28 01/15/21 00:27 01/15/21 04:13  Glucose-Capillary 70 - 99 mg/dL 338 (H) 250 (H) 539 (H) 140 (H) 112 (H) 130 (H) 209 (H)    Latest Reference Range & Units 04/23/19 15:06 09/28/20 03:30 01/12/21 22:00  Hemoglobin A1C 4.8 - 5.6 % 16.8 (H) 7.4 (H) 9.6 (H)   Review of Glycemic Control  Diabetes history: DM2 Outpatient Diabetes medications: Metformin 1000 mg BID, Glipizide 10 mg BID Current orders for Inpatient glycemic control: Semglee 7 units QHS, Novolog 0-9 units Q4H   Inpatient Diabetes Program Recommendations:    HbgA1C:  A1C 9.6% on 01/12/21 indicating an average glucose of 229 mg/dl over the past 2-3 months. Would not recommend resuming Metformin as outpatient due to GI side effects given patient is having persistent issue with nausea and vomiting over the past 6 months.  NOTE: Spoke with patient about diabetes and home regimen for diabetes control. Patient reports he is in a much better mood today and he is feeling better.  Patient reports being followed by PCP for diabetes management and he was just taking Metformin and his doctor wanted him to restart Glipizide recently. Patient states that he did not take any DM medications 2 days prior to admission due to nausea and vomiting. Patient reports that he has been having nausea and vomiting issues for last 6 months.  Discussed Metformin,how it works, and GI side effects. Informed patient that Metformin is likely contributing to GI issues he has been having.  Patient states it is hard for him to believe that a pill that small as Glipizide does anything at all.  Discussed  Glipizide and how it works to stimulate pancreas to make more insulin. Also discussed glucose metabolism. Discussed progressive nature of DM2. Patient states he is planning to make changes with diet and exercise and he is hopeful that he will be able to come off all DM medications in the future. Patient states that he checks his glucose 2 times per day and his last A1C was 7.4%.   Discussed A1C results (9.6% on 01/12/21) and explained that current A1C indicates an average glucose of 229 mg/dl over the past 2-3 months. Discussed glucose and A1C goals. Discussed importance of checking CBGs and maintaining good CBG control to prevent long-term and short-term complications. Explained how hyperglycemia leads to damage within blood vessels and nerves which lead to the common complications seen with uncontrolled diabetes. Discussed gastroparesis and how uncontrolled glucose levels make the issue worse.  Encouraged patient to take DM medication as prescribed at discharge and to follow up with PCP regarding DM control. Patient states he has FIT program which helps him get his medications.  Patient appreciative of discussion today and verbalized understanding of information discussed and reports no further questions at this time related to diabetes.  Thanks, Orlando Penner, RN, MSN, CDE Diabetes Coordinator Inpatient Diabetes Program [phone] (Team Pager)

## 2021-01-15 NOTE — Progress Notes (Signed)
Pt afternoon CBG @ 35 Pt received 3 cups of orange juice and had dinner On recheck pt cbg @ 290

## 2021-01-15 NOTE — Progress Notes (Signed)
PROGRESS NOTE    James Bradford  ZOX:[id number] DOB: 1965-11-03 DOA: 01/12/2021 PCP: Medicine, [facility]    Brief Narrative:  55 year old gentleman with history of hypertension on lisinopril, non-insulin-dependent type 2 diabetes on Glipizide and metformin, hyperlipidemia on a statin, hypertension, CKD stage IIIb and substance abuse presented with 1 week of nausea, vomiting, abdominal pain and high blood glucose.  In the emergency room he was tachycardic.  Sodium 126.  Blood glucose 508.  Anion gap 22.  COVID-19 and influenza negative.  CT abdomen and pelvis without any abnormalities.  Treated with IV fluids and insulin.   Assessment & Plan:   Principal Problem:   Hyperglycemia Active Problems:   HYPERCHOLESTEROLEMIA   AKI (acute kidney injury) (HCC)   Marijuana abuse   DKA (diabetic ketoacidosis) (HCC)   Ketoacidosis, diabetic, type 2, no coma (HCC)  Intractable nausea vomiting likely secondary to gastroparesis and possibly aggravated by marijuana use. Ketoacidosis, likely starvation ketosis. -Patient presented with severe symptoms.  He was not tolerating any diet despite as needed nausea medications. -12/12, started on scheduled Reglan 10 mg IV before each meal and tolerating liquids today.  Challenge with diabetic diet today. -Recurrent admissions, will benefit with gastric emptying study.  Patient received Reglan in the morning along with breakfast. -Stop Reglan tonight, n.p.o. past midnight, gastric emptying study tomorrow morning.  If he has positive test results with significant gastroparesis or difficulty gastric emptying, he will benefit with a scheduled Reglan on discharge.  Acute kidney injury on CKD stage IIIb with baseline creatinine about 1.5-1.8.  Moderate to severe dehydration:  Discontinue lisinopril.  Continue maintenance IV fluids today.  Very good response and renal functions back to his usual levels today.  Type 2 diabetes with hyperglycemia,  uncontrolled: Hemoglobin C 9.6.  Blood sugars mostly more than 150.  On metformin at home.  Plan was to restart glipizide. He does not want to go on insulin doses.  Metformin probably causing or exacerbating gastrointestinal symptoms. Plan to discharge on glipizide and Januvia with outpatient follow-up.  Currently remains on sliding scale insulin along with 7 units of long-acting insulin at night.  Hyperlipidemia: On statin that is continued.  Hypomagnesemia: Replaced today.  Essential hypertension: Blood pressures elevated.  Lisinopril discontinued due to recurrent renal failure.  Restarted on amlodipine with very good response.  Will discharge on amlodipine.   DVT prophylaxis: Place and maintain sequential compression device Start: 01/12/21 2220   Code Status: Full code Family Communication: None Disposition Plan: Status is: Challenging with oral intake.  Still using around-the-clock nausea medications through IV to tolerate diet.   Consultants:  None  Procedures:  None  Antimicrobials:  None   Subjective:  Patient seen and examined.  Today he is nausea is better.  His visitor brought some good food from a restaurant and he ate breakfast 7 AM in the morning.  He is using metoclopramide 30 minutes before meals and since last evening he has not suffered any vomiting.  Wants to try some real food.  Mostly liquids today. Gastric emptying test could not be done because of early morning Reglan injection as well patient had breakfast.  Objective: Vitals:   01/14/21 0854 01/14/21 1859 01/14/21 2028 01/15/21 0747  BP: (!) 180/98 (!) 125/95 120/79 132/88  Pulse: 83 100 (!) 104 (!) 104  Resp: 17 18 18 18   Temp: 98.1 F (36.7 C) 99.2 F (37.3 C) 99.3 F (37.4 C) 98.4 F (36.9 C)  TempSrc: Oral Oral Oral Oral  SpO2: 100% 100% 100% 100%    Intake/Output Summary (Last 24 hours) at 01/15/2021 1241 Last data filed at 01/14/2021 1300 Gross per 24 hour  Intake --  Output 400 ml   Net -400 ml   There were no vitals filed for this visit.  Examination:  General exam: Looks fairly comfortable today. Respiratory system: Clear to auscultation. Respiratory effort normal. Cardiovascular system: S1 & S2 heard, RRR. No JVD, murmurs, rubs, gallops or clicks. No pedal edema. Gastrointestinal system: Abdomen is nondistended, soft and nontender. No organomegaly or masses felt. Normal bowel sounds heard. Central nervous system: Alert and oriented. No focal neurological deficits. Extremities: Symmetric 5 x 5 power. Skin: No rashes, lesions or ulcers Psychiatry: Judgement and insight appear normal.  Normal mood.    Data Reviewed: I have personally reviewed following labs and imaging studies  CBC: Recent Labs  Lab 01/12/21 1357 01/12/21 1741 01/13/21 0200 01/15/21 0837  WBC 9.5  --  8.0 7.1  NEUTROABS  --   --   --  4.1  HGB 13.7 13.3 12.3* 10.6*  HCT 41.1 39.0 35.5* 30.6*  MCV 92.6  --  89.2 90.0  PLT 537*  --  453* [id number]   Basic Metabolic Panel: Recent Labs  Lab 01/12/21 1357 01/12/21 1730 01/12/21 1741 01/12/21 1949 01/13/21 0200 01/15/21 0837  NA 126* 131* 133* 132* 133* 133*  K 4.1 4.3 3.9 4.2 3.5 3.5  CL 87* 95*  --  95* 98 102  CO2 17* 20*  --  21* 24 23  GLUCOSE 508* 206*  --  211* 64* 223*  BUN 52* 53*  --  51* 43* 19  CREATININE 3.47* 3.30*  --  2.82* 2.28* 1.46*  CALCIUM 10.1 9.6  --  9.1 9.3 8.3*  MG  --   --   --   --   --  1.6*   GFR: CrCl cannot be calculated (Unknown ideal weight.). Liver Function Tests: Recent Labs  Lab 01/12/21 1357  AST 22  ALT 30  ALKPHOS 81  BILITOT 1.4*  PROT 8.4*  ALBUMIN 4.7   Recent Labs  Lab 01/12/21 1357  LIPASE 73*   No results for input(s): AMMONIA in the last 168 hours. Coagulation Profile: No results for input(s): INR, PROTIME in the last 168 hours. Cardiac Enzymes: No results for input(s): CKTOTAL, CKMB, CKMBINDEX, TROPONINI in the last 168 hours. BNP (last 3 results) No results for  input(s): PROBNP in the last 8760 hours. HbA1C: Recent Labs    01/12/21 2200  HGBA1C 9.6*   CBG: Recent Labs  Lab 01/14/21 1856 01/14/21 2028 01/15/21 0027 01/15/21 0413 01/15/21 1209  GLUCAP 140* 112* 130* 209* 323*   Lipid Profile: No results for input(s): CHOL, HDL, LDLCALC, TRIG, CHOLHDL, LDLDIRECT in the last 72 hours. Thyroid Function Tests: No results for input(s): TSH, T4TOTAL, FREET4, T3FREE, THYROIDAB in the last 72 hours. Anemia Panel: No results for input(s): VITAMINB12, FOLATE, FERRITIN, TIBC, IRON, RETICCTPCT in the last 72 hours. Sepsis Labs: No results for input(s): PROCALCITON, LATICACIDVEN in the last 168 hours.  Recent Results (from the past 240 hour(s))  Resp Panel by RT-PCR (Flu A&B, Covid) Nasopharyngeal Swab     Status: None   Collection Time: 01/12/21  4:25 PM   Specimen: Nasopharyngeal Swab; Nasopharyngeal(NP) swabs in vial transport medium  Result Value Ref Range Status   SARS Coronavirus 2 by RT PCR NEGATIVE NEGATIVE Final    Comment: (NOTE) SARS-CoV-2 target nucleic acids are NOT DETECTED.  The SARS-CoV-2  RNA is generally detectable in upper respiratory specimens during the acute phase of infection. The lowest concentration of SARS-CoV-2 viral copies this assay can detect is 138 copies/mL. A negative result does not preclude SARS-Cov-2 infection and should not be used as the sole basis for treatment or other patient management decisions. A negative result may occur with  improper specimen collection/handling, submission of specimen other than nasopharyngeal swab, presence of viral mutation(s) within the areas targeted by this assay, and inadequate number of viral copies(<138 copies/mL). A negative result must be combined with clinical observations, patient history, and epidemiological information. The expected result is Negative.  Fact Sheet for Patients:  BloggerCourse.com  Fact Sheet for Healthcare Providers:   SeriousBroker.it  This test is no t yet approved or cleared by the [place] FDA and  has been authorized for detection and/or diagnosis of SARS-CoV-2 by FDA under an Emergency Use Authorization (EUA). This EUA will remain  in effect (meaning this test can be used) for the duration of the COVID-19 declaration under Section 564(b)(1) of the Act, 21 U.S.C.section 360bbb-3(b)(1), unless the authorization is terminated  or revoked sooner.       Influenza A by PCR NEGATIVE NEGATIVE Final   Influenza B by PCR NEGATIVE NEGATIVE Final    Comment: (NOTE) The Xpert Xpress SARS-CoV-2/FLU/RSV plus assay is intended as an aid in the diagnosis of influenza from Nasopharyngeal swab specimens and should not be used as a sole basis for treatment. Nasal washings and aspirates are unacceptable for Xpert Xpress SARS-CoV-2/FLU/RSV testing.  Fact Sheet for Patients: BloggerCourse.com  Fact Sheet for Healthcare Providers: SeriousBroker.it  This test is not yet approved or cleared by the [place] FDA and has been authorized for detection and/or diagnosis of SARS-CoV-2 by FDA under an Emergency Use Authorization (EUA). This EUA will remain in effect (meaning this test can be used) for the duration of the COVID-19 declaration under Section 564(b)(1) of the Act, 21 U.S.C. section 360bbb-3(b)(1), unless the authorization is terminated or revoked.  Performed at [facility], [address], [place], [place] [address]          Radiology Studies: No results found.      Scheduled Meds:  amLODipine  5 mg Oral Daily   insulin aspart  0-9 Units Subcutaneous Q4H   insulin glargine-yfgn  7 Units Subcutaneous QHS   metoCLOPramide (REGLAN) injection  10 mg Intravenous TID AC   pravastatin  10 mg Oral Daily   Continuous Infusions:  sodium chloride 125 mL/hr at 01/13/21 1631   magnesium sulfate bolus IVPB        LOS: 2 days    Time spent: 30 minutes    Dorcas Carrow, MD [facility] Pager [phone]

## 2021-01-16 ENCOUNTER — Inpatient Hospital Stay (HOSPITAL_COMMUNITY): Payer: Self-pay

## 2021-01-16 LAB — GLUCOSE, CAPILLARY
Glucose-Capillary: 158 mg/dL — ABNORMAL HIGH (ref 70–99)
Glucose-Capillary: 258 mg/dL — ABNORMAL HIGH (ref 70–99)

## 2021-01-16 MED ORDER — TECHNETIUM TC 99M SULFUR COLLOID
2.1000 | Freq: Once | INTRAVENOUS | Status: AC | PRN
  Administered 2021-01-16: 08:00:00 2.1 via ORAL

## 2021-01-16 NOTE — Progress Notes (Signed)
Patient just returned to unit with security and a staff member. RN told patient was outside of CT yelling in the halls. RN went into room to see what was going on with patient.   Side note, patient left around 0830 to go for a gastric emptying.   RN entered room, patient stated he was leaving and his IV needs to come out. RN asked patient if she could call the MD to have him come to bedside. Patient stated no they are leaving. RN asked if he can sign AMA sheet.   Patient stated he is not signing anything and wants his IV out so he can leave.  Provider paged to make aware. Security at bedside while RN takes IV out. Patient escorted to lobby by security.

## 2021-01-16 NOTE — Progress Notes (Signed)
Inpatient Diabetes Program Recommendations  AACE/ADA: New Consensus Statement on Inpatient Glycemic Control   Target Ranges:  Prepandial:   less than 140 mg/dL      Peak postprandial:   less than 180 mg/dL (1-2 hours)      Critically ill patients:  140 - 180 mg/dL     Latest Reference Range & Units 01/15/21 18:46 01/15/21 20:30 01/15/21 23:41 01/16/21 00:21 01/16/21 04:49  Glucose-Capillary 70 - 99 mg/dL 665 (H) 993 (H) 40 (LL) 158 (H) 258 (H)    Latest Reference Range & Units 01/15/21 04:13 01/15/21 12:09 01/15/21 15:57  Glucose-Capillary 70 - 99 mg/dL 570 (H) 177 (H) 35 (LL)   Review of Glycemic Control  Diabetes history: DM2 Outpatient Diabetes medications: Metformin 1000 mg BID, Glipizide 10 mg BID Current orders for Inpatient glycemic control: Semglee 7 units QHS, Novolog 0-9 units Q4H   Inpatient Diabetes Program Recommendations:     Insulin: Please consider decreasing frequency of CBGs to AC&HS, decreasing Novolog to 0-6 units AC&HS.  May want to consider ordering Glipizide (may want to start with 5 or 10 mg daily) while inpatient to assess response on glucose. If so, would recommend discontinuing Semglee if oral DM medications are ordered while inpatient.  HbgA1C:  A1C 9.6% on 01/12/21 indicating an average glucose of 229 mg/dl over the past 2-3 months. Would not recommend resuming Metformin as outpatient due to GI side effects given patient is having persistent issue with nausea and vomiting over the past 6 months.  Thanks, Orlando Penner, RN, MSN, CDE Diabetes Coordinator Inpatient Diabetes Program [phone] (Team Pager from 8am to 5pm)

## 2021-01-31 NOTE — Discharge Summary (Signed)
Admitted 01/12/2021 Discharged 01/16/2021.  Left AMA.  James Bradford was admitted with history of hypertension on lisinopril, type 2 diabetes on glipizide and metformin, hyperlipidemia on statin, CKD stage IIIb and substance abuse with 1 week history of nausea, vomiting, abdominal pain and high blood sugars.  He was treated with IV fluids, IV insulin and subsequently subcutaneous insulin.  He had excellent recovery.  Underwent gastric emptying test and it was essentially normal.  Patient was already tolerating regular diet, by attending provider was waiting for him to come back from the test to be evaluated for discharge readiness, however patient just got ready and walked out of the room.  Patient was advised to wait for MD to come to the bedside to talk to him and make a discharge plan, however he did not wait for the MD and he was also not willing to sign AMA paperwork.  Patient was able to make his own healthcare decisions and was competent so he was allowed to leave the hospital.   This provider was not in service for the patient on the day of discharge.  No face-to-face encounter was possible.  No charge visit.

## 2021-09-09 ENCOUNTER — Encounter (HOSPITAL_COMMUNITY): Payer: Self-pay

## 2021-09-09 ENCOUNTER — Other Ambulatory Visit: Payer: Self-pay

## 2021-09-09 ENCOUNTER — Observation Stay (HOSPITAL_COMMUNITY)
Admission: EM | Admit: 2021-09-09 | Discharge: 2021-09-10 | Disposition: A | Discharge status: Home / Self Care | Payer: Commercial Managed Care - HMO | Attending: Family Medicine | Admitting: Family Medicine

## 2021-09-09 DIAGNOSIS — E785 Hyperlipidemia, unspecified: Secondary | ICD-10-CM | POA: Diagnosis present

## 2021-09-09 DIAGNOSIS — D649 Anemia, unspecified: Secondary | ICD-10-CM | POA: Diagnosis not present

## 2021-09-09 DIAGNOSIS — Z7984 Long term (current) use of oral hypoglycemic drugs: Secondary | ICD-10-CM | POA: Diagnosis not present

## 2021-09-09 DIAGNOSIS — R112 Nausea with vomiting, unspecified: Secondary | ICD-10-CM | POA: Diagnosis present

## 2021-09-09 DIAGNOSIS — E119 Type 2 diabetes mellitus without complications: Secondary | ICD-10-CM

## 2021-09-09 DIAGNOSIS — I129 Hypertensive chronic kidney disease with stage 1 through stage 4 chronic kidney disease, or unspecified chronic kidney disease: Secondary | ICD-10-CM | POA: Diagnosis not present

## 2021-09-09 DIAGNOSIS — N183 Chronic kidney disease, stage 3 unspecified: Secondary | ICD-10-CM | POA: Diagnosis present

## 2021-09-09 DIAGNOSIS — E1159 Type 2 diabetes mellitus with other circulatory complications: Secondary | ICD-10-CM | POA: Diagnosis present

## 2021-09-09 DIAGNOSIS — I10 Essential (primary) hypertension: Secondary | ICD-10-CM | POA: Diagnosis present

## 2021-09-09 DIAGNOSIS — Z79899 Other long term (current) drug therapy: Secondary | ICD-10-CM | POA: Diagnosis not present

## 2021-09-09 DIAGNOSIS — E1110 Type 2 diabetes mellitus with ketoacidosis without coma: Secondary | ICD-10-CM | POA: Insufficient documentation

## 2021-09-09 DIAGNOSIS — E1122 Type 2 diabetes mellitus with diabetic chronic kidney disease: Secondary | ICD-10-CM | POA: Insufficient documentation

## 2021-09-09 DIAGNOSIS — E1169 Type 2 diabetes mellitus with other specified complication: Secondary | ICD-10-CM | POA: Diagnosis present

## 2021-09-09 DIAGNOSIS — N1831 Chronic kidney disease, stage 3a: Secondary | ICD-10-CM | POA: Insufficient documentation

## 2021-09-09 DIAGNOSIS — E1310 Other specified diabetes mellitus with ketoacidosis without coma: Secondary | ICD-10-CM

## 2021-09-09 LAB — CBG MONITORING, ED
Glucose-Capillary: 300 mg/dL — ABNORMAL HIGH (ref 70–99)
Glucose-Capillary: 260 mg/dL — ABNORMAL HIGH (ref 70–99)
Glucose-Capillary: 120 mg/dL — ABNORMAL HIGH (ref 70–99)

## 2021-09-09 LAB — BASIC METABOLIC PANEL
Anion gap: 16 — ABNORMAL HIGH (ref 5–15)
Anion gap: 15 (ref 5–15)
Anion gap: 10 (ref 5–15)
BUN: 20 mg/dL (ref 6–20)
BUN: 22 mg/dL — ABNORMAL HIGH (ref 6–20)
BUN: 22 mg/dL — ABNORMAL HIGH (ref 6–20)
CO2: 15 mmol/L — ABNORMAL LOW (ref 22–32)
CO2: 17 mmol/L — ABNORMAL LOW (ref 22–32)
CO2: 20 mmol/L — ABNORMAL LOW (ref 22–32)
Calcium: 8.9 mg/dL (ref 8.9–10.3)
Calcium: 9.7 mg/dL (ref 8.9–10.3)
Calcium: 8.9 mg/dL (ref 8.9–10.3)
Chloride: 107 mmol/L (ref 98–111)
Chloride: 106 mmol/L (ref 98–111)
Chloride: 106 mmol/L (ref 98–111)
Creatinine, Ser: 1.33 mg/dL — ABNORMAL HIGH (ref 0.61–1.24)
Creatinine, Ser: 1.54 mg/dL — ABNORMAL HIGH (ref 0.61–1.24)
Creatinine, Ser: 1.32 mg/dL — ABNORMAL HIGH (ref 0.61–1.24)
GFR, Estimated: >60 mL/min (ref >60)
GFR, Estimated: 53 mL/min — ABNORMAL LOW (ref >60)
GFR, Estimated: >60 mL/min (ref >60)
Glucose, Bld: 219 mg/dL — ABNORMAL HIGH (ref 70–99)
Glucose, Bld: 156 mg/dL — ABNORMAL HIGH (ref 70–99)
Glucose, Bld: 155 mg/dL — ABNORMAL HIGH (ref 70–99)
Potassium: 4 mmol/L (ref 3.5–5.1)
Potassium: 4.1 mmol/L (ref 3.5–5.1)
Potassium: 4.6 mmol/L (ref 3.5–5.1)
Sodium: 138 mmol/L (ref 135–145)
Sodium: 138 mmol/L (ref 135–145)
Sodium: 136 mmol/L (ref 135–145)

## 2021-09-09 LAB — CBC WITH DIFFERENTIAL/PLATELET
Abs Immature Granulocytes: 0.02 10*3/uL (ref 0.00–0.07)
Basophils Absolute: 0 10*3/uL (ref 0.0–0.1)
Basophils Relative: 0 %
Eosinophils Absolute: 0 10*3/uL (ref 0.0–0.5)
Eosinophils Relative: 0 %
HCT: 34.4 % — ABNORMAL LOW (ref 39.0–52.0)
Hemoglobin: 11.1 g/dL — ABNORMAL LOW (ref 13.0–17.0)
Immature Granulocytes: 0 %
Lymphocytes Relative: 12 %
Lymphs Abs: 0.8 10*3/uL (ref 0.7–4.0)
MCH: 31.3 pg (ref 26.0–34.0)
MCHC: 32.3 g/dL (ref 30.0–36.0)
MCV: 96.9 fL (ref 80.0–100.0)
Monocytes Absolute: 0.2 10*3/uL (ref 0.1–1.0)
Monocytes Relative: 3 %
Neutro Abs: 5.4 10*3/uL (ref 1.7–7.7)
Neutrophils Relative %: 85 %
Platelets: 265 10*3/uL (ref 150–400)
RBC: 3.55 MIL/uL — ABNORMAL LOW (ref 4.22–5.81)
RDW: 14 % (ref 11.5–15.5)
WBC: 6.4 10*3/uL (ref 4.0–10.5)
nRBC: 0 % (ref 0.0–0.2)

## 2021-09-09 LAB — GLUCOSE, CAPILLARY
Glucose-Capillary: 116 mg/dL — ABNORMAL HIGH (ref 70–99)
Glucose-Capillary: 123 mg/dL — ABNORMAL HIGH (ref 70–99)
Glucose-Capillary: 131 mg/dL — ABNORMAL HIGH (ref 70–99)
Glucose-Capillary: 187 mg/dL — ABNORMAL HIGH (ref 70–99)
Glucose-Capillary: 93 mg/dL (ref 70–99)

## 2021-09-09 LAB — COMPREHENSIVE METABOLIC PANEL
ALT: 34 U/L (ref 0–44)
AST: 31 U/L (ref 15–41)
Albumin: 4.6 g/dL (ref 3.5–5.0)
Alkaline Phosphatase: 63 U/L (ref 38–126)
Anion gap: 17 — ABNORMAL HIGH (ref 5–15)
BUN: 22 mg/dL — ABNORMAL HIGH (ref 6–20)
CO2: 16 mmol/L — ABNORMAL LOW (ref 22–32)
Calcium: 9.4 mg/dL (ref 8.9–10.3)
Chloride: 103 mmol/L (ref 98–111)
Creatinine, Ser: 1.42 mg/dL — ABNORMAL HIGH (ref 0.61–1.24)
GFR, Estimated: 59 mL/min — ABNORMAL LOW (ref >60)
Glucose, Bld: 312 mg/dL — ABNORMAL HIGH (ref 70–99)
Potassium: 4.5 mmol/L (ref 3.5–5.1)
Sodium: 136 mmol/L (ref 135–145)
Total Bilirubin: 0.8 mg/dL (ref 0.3–1.2)
Total Protein: 8 g/dL (ref 6.5–8.1)

## 2021-09-09 LAB — BLOOD GAS, VENOUS
Acid-base deficit: 8.5 mmol/L — ABNORMAL HIGH (ref 0.0–2.0)
Bicarbonate: 16.5 mmol/L — ABNORMAL LOW (ref 20.0–28.0)
O2 Saturation: 47.5 %
Patient temperature: 37
pCO2, Ven: 32 mmHg — ABNORMAL LOW (ref 44–60)
pH, Ven: 7.32 (ref 7.25–7.43)
pO2, Ven: <31 mmHg — CL (ref 32–45)

## 2021-09-09 LAB — I-STAT CHEM 8, ED
BUN: 20 mg/dL (ref 6–20)
Calcium, Ion: 1.19 mmol/L (ref 1.15–1.40)
Chloride: 103 mmol/L (ref 98–111)
Creatinine, Ser: 1.2 mg/dL (ref 0.61–1.24)
Glucose, Bld: 325 mg/dL — ABNORMAL HIGH (ref 70–99)
HCT: 35 % — ABNORMAL LOW (ref 39.0–52.0)
Hemoglobin: 11.9 g/dL — ABNORMAL LOW (ref 13.0–17.0)
Potassium: 4.5 mmol/L (ref 3.5–5.1)
Sodium: 136 mmol/L (ref 135–145)
TCO2: 17 mmol/L — ABNORMAL LOW (ref 22–32)

## 2021-09-09 LAB — HIV ANTIBODY (ROUTINE TESTING W REFLEX): HIV Screen 4th Generation wRfx: NONREACTIVE

## 2021-09-09 LAB — LACTIC ACID, PLASMA
Lactic Acid, Venous: 1.8 mmol/L (ref 0.5–1.9)
Lactic Acid, Venous: 2 mmol/L (ref 0.5–1.9)

## 2021-09-09 LAB — HEMOGLOBIN A1C
Hgb A1c MFr Bld: 13.5 % — ABNORMAL HIGH (ref 4.8–5.6)
Mean Plasma Glucose: 340.75 mg/dL

## 2021-09-09 LAB — BETA-HYDROXYBUTYRIC ACID
Beta-Hydroxybutyric Acid: 4.81 mmol/L — ABNORMAL HIGH (ref 0.05–0.27)
Beta-Hydroxybutyric Acid: 4.06 mmol/L — ABNORMAL HIGH (ref 0.05–0.27)

## 2021-09-09 MED ORDER — ONDANSETRON HCL 4 MG/2ML IJ SOLN
4.0000 mg | Freq: Once | INTRAMUSCULAR | Status: AC
  Administered 2021-09-09: 4 mg via INTRAVENOUS
  Filled 2021-09-09: qty 2

## 2021-09-09 MED ORDER — PROCHLORPERAZINE EDISYLATE 10 MG/2ML IJ SOLN: 10.0000 mg | Freq: Four times a day (QID) | INTRAMUSCULAR | Status: DC | PRN

## 2021-09-09 MED ORDER — ACETAMINOPHEN 325 MG PO TABS
650.0000 mg | ORAL_TABLET | Freq: Four times a day (QID) | ORAL | Status: DC | PRN
  Administered 2021-09-10: 650 mg via ORAL
  Filled 2021-09-09: qty 2

## 2021-09-09 MED ORDER — POTASSIUM CHLORIDE 10 MEQ/100ML IV SOLN
10.0000 meq | INTRAVENOUS | Status: AC
  Administered 2021-09-09 – 2021-09-10 (×2): 10 meq via INTRAVENOUS
  Filled 2021-09-09: qty 100

## 2021-09-09 MED ORDER — INSULIN ASPART 100 UNIT/ML IJ SOLN
5.0000 [IU] | Freq: Once | INTRAMUSCULAR | Status: AC
Start: 2021-09-09 — End: 2021-09-09
  Administered 2021-09-09: 5 [IU] via SUBCUTANEOUS
  Filled 2021-09-09: qty 0.05

## 2021-09-09 MED ORDER — SODIUM CHLORIDE 0.9 % IV BOLUS
1000.0000 mL | Freq: Once | INTRAVENOUS | Status: AC
  Administered 2021-09-09: 1000 mL via INTRAVENOUS

## 2021-09-09 MED ORDER — INSULIN ASPART 100 UNIT/ML IJ SOLN
0.0000 [IU] | Freq: Three times a day (TID) | INTRAMUSCULAR | Status: DC
  Filled 2021-09-09: qty 0.15

## 2021-09-09 MED ORDER — ACETAMINOPHEN 650 MG RE SUPP: 650.0000 mg | Freq: Four times a day (QID) | RECTAL | Status: DC | PRN

## 2021-09-09 MED ORDER — LACTATED RINGERS IV SOLN: INTRAVENOUS | Status: DC

## 2021-09-09 MED ORDER — LISINOPRIL 10 MG PO TABS
10.0000 mg | ORAL_TABLET | Freq: Every day | ORAL | Status: DC
  Administered 2021-09-10: 10 mg via ORAL
  Filled 2021-09-09: qty 1

## 2021-09-09 MED ORDER — PRAVASTATIN SODIUM 20 MG PO TABS
10.0000 mg | ORAL_TABLET | Freq: Every day | ORAL | Status: DC
  Administered 2021-09-09 – 2021-09-10 (×2): 10 mg via ORAL
  Filled 2021-09-09 (×2): qty 1

## 2021-09-09 MED ORDER — INSULIN ASPART 100 UNIT/ML IJ SOLN
0.0000 [IU] | Freq: Three times a day (TID) | INTRAMUSCULAR | Status: DC
  Administered 2021-09-09: 1 [IU] via SUBCUTANEOUS
  Filled 2021-09-09: qty 0.09

## 2021-09-09 MED ORDER — DEXTROSE 50 % IV SOLN: 0.0000 mL | INTRAVENOUS | Status: DC | PRN

## 2021-09-09 MED ORDER — ORAL CARE MOUTH RINSE: 15.0000 mL | OROMUCOSAL | Status: DC | PRN

## 2021-09-09 MED ORDER — INSULIN REGULAR(HUMAN) IN NACL 100-0.9 UT/100ML-% IV SOLN
INTRAVENOUS | Status: DC
  Administered 2021-09-09: 4.6 [IU]/h via INTRAVENOUS
  Filled 2021-09-09: qty 100

## 2021-09-09 MED ORDER — CHLORHEXIDINE GLUCONATE CLOTH 2 % EX PADS: 6.0000 | MEDICATED_PAD | Freq: Every day | CUTANEOUS | Status: DC

## 2021-09-09 MED ORDER — DEXTROSE IN LACTATED RINGERS 5 % IV SOLN: INTRAVENOUS | Status: DC

## 2021-09-09 NOTE — ED Triage Notes (Signed)
C/o n/v since last night. Hx dm type 2 on metformin and glipizide. Denies hematemesis, abd pain, diarrhea. Vss. Nadn.

## 2021-09-09 NOTE — Progress Notes (Signed)
Patient arrived to room 1519 in NAD, VS stable and patient free from pain. Patient oriented to room and call bell in reach.

## 2021-09-09 NOTE — Progress Notes (Signed)
Report given to Chanel, RN in ICU.

## 2021-09-09 NOTE — ED Provider Notes (Signed)
James Bradford   CSN: [id number] Arrival date & time: 09/09/21  2229     History  Chief Complaint  Patient presents with   Nausea    James Bradford is a 56 y.o. male.  Patient is a 56 year old male with a history of diabetes, hypertension, chronic kidney disease and substance abuse who presents with vomiting.  He states that he started vomiting last night.  He denies associate abdominal pain.  No diarrhea.  No known fevers.  No cough or cold symptoms.  He has had a history of prior DKA in the past.  He says his blood sugars have been a bit elevated recently.  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  He was last admitted in December for the symptoms.       Home Medications Prior to Admission medications   Medication Sig Start Date End Date Taking? Authorizing Provider  acetaminophen (TYLENOL) 500 MG tablet Take 500 mg by mouth every 6 (six) hours as needed for moderate pain.   Yes [provider]  BLACK CURRANT SEED OIL PO Take 5 mLs by mouth daily.   Yes [provider]  dicyclomine (BENTYL) 20 MG tablet Take 1 tablet (20 mg total) by mouth 2 (two) times daily. 12/25/19  Yes Moshe Cipro, NP  glipiZIDE (GLUCOTROL) 10 MG tablet Take 10 mg by mouth 2 (two) times daily. 08/03/20  Yes [provider]  KRILL OIL PO Take 1 capsule by mouth daily.   Yes [provider]  lisinopril (ZESTRIL) 10 MG tablet Take 1 tablet (10 mg total) by mouth daily. 04/24/19  Yes Cresenzo, Cyndi Lennert, MD  metFORMIN (GLUCOPHAGE) 1000 MG tablet Take 1 tablet (1,000 mg total) by mouth 2 (two) times daily with a meal. Patient taking differently: Take 500 mg by mouth 2 (two) times daily with a meal. 04/24/19  Yes Cresenzo, Cyndi Lennert, MD  Multiple Vitamin (MULTIVITAMIN ADULT PO) Take 1 tablet by mouth daily.   Yes [provider]   pravastatin (PRAVACHOL) 10 MG tablet Take 1 tablet (10 mg total) by mouth daily. 04/24/19  Yes Cresenzo, Cyndi Lennert, MD  sildenafil (VIAGRA) 100 MG tablet Take 100 mg by mouth daily as needed for erectile dysfunction. 12/21/19  Yes [provider]  Accu-Chek Softclix Lancets lancets Use as instructed 04/24/19   Celedonio Savage, MD  glucose blood (ACCU-CHEK GUIDE) test strip Use as instructed 04/24/19   Celedonio Savage, MD      Allergies    Ibuprofen, Pork-derived products, and Aspirin    Review of Systems   Review of Systems  Constitutional:  Negative for chills, diaphoresis, fatigue and fever.  HENT:  Negative for congestion, rhinorrhea and sneezing.   Eyes: Negative.   Respiratory:  Negative for cough, chest tightness and shortness of breath.   Cardiovascular:  Negative for chest pain and leg swelling.  Gastrointestinal:  Positive for nausea and vomiting. Negative for abdominal pain, blood in stool and diarrhea.  Genitourinary:  Negative for difficulty urinating, flank pain, frequency and hematuria.  Musculoskeletal:  Negative for arthralgias and back pain.  Skin:  Negative for rash.  Neurological:  Negative for dizziness, speech difficulty, weakness, numbness and headaches.    Physical Exam Updated Vital Signs BP 91/75   Pulse 95   Temp 98 F (36.7 C)   Resp 18   Ht 5\' 7"  (1.702  m)   Wt 68 kg   SpO2 100%   BMI 23.49 kg/m  Physical Exam Constitutional:      Appearance: He is well-developed.  HENT:     Head: Normocephalic and atraumatic.  Eyes:     Pupils: Pupils are equal, round, and reactive to light.  Cardiovascular:     Rate and Rhythm: Normal rate and regular rhythm.     Heart sounds: Normal heart sounds.  Pulmonary:     Effort: Pulmonary effort is normal. No respiratory distress.     Breath sounds: Normal breath sounds. No wheezing or rales.  Chest:     Chest wall: No tenderness.  Abdominal:     General: Bowel sounds are normal.     Palpations: Abdomen  is soft.     Tenderness: There is abdominal tenderness (Mild generalized tenderness). There is no guarding or rebound.  Musculoskeletal:        General: Normal range of motion.     Cervical back: Normal range of motion and neck supple.  Lymphadenopathy:     Cervical: No cervical adenopathy.  Skin:    General: Skin is warm and dry.     Findings: No rash.  Neurological:     Mental Status: He is alert and oriented to person, place, and time.     ED Results / Procedures / Treatments   Labs (all labs ordered are listed, but only abnormal results are displayed) Labs Reviewed  COMPREHENSIVE METABOLIC PANEL - Abnormal; Notable for the following components:      Result Value   CO2 16 (*)    Glucose, Bld 312 (*)    BUN 22 (*)    Creatinine, Ser 1.42 (*)    GFR, Estimated 59 (*)    Anion gap 17 (*)    All other components within normal limits  LACTIC ACID, PLASMA - Abnormal; Notable for the following components:   Lactic Acid, Venous 2.0 (*)    All other components within normal limits  CBC WITH DIFFERENTIAL/PLATELET - Abnormal; Notable for the following components:   RBC 3.55 (*)    Hemoglobin 11.1 (*)    HCT 34.4 (*)    All other components within normal limits  BLOOD GAS, VENOUS - Abnormal; Notable for the following components:   pCO2, Ven 32 (*)    pO2, Ven <31 (*)    Bicarbonate 16.5 (*)    Acid-base deficit 8.5 (*)    All other components within normal limits  BASIC METABOLIC PANEL - Abnormal; Notable for the following components:   CO2 15 (*)    Glucose, Bld 219 (*)    Creatinine, Ser 1.33 (*)    Anion gap 16 (*)    All other components within normal limits  CBG MONITORING, ED - Abnormal; Notable for the following components:   Glucose-Capillary 300 (*)    All other components within normal limits  I-STAT CHEM 8, ED - Abnormal; Notable for the following components:   Glucose, Bld 325 (*)    TCO2 17 (*)    Hemoglobin 11.9 (*)    HCT 35.0 (*)    All other components  within normal limits  CBG MONITORING, ED - Abnormal; Notable for the following components:   Glucose-Capillary 260 (*)    All other components within normal limits  LACTIC ACID, PLASMA    EKG EKG Interpretation  Date/Time:  Monday September 09 2021 08:25:43 EDT Ventricular Rate:  52 PR Interval:  147 QRS Duration: 91 QT Interval:  440 QTC Calculation: 410 R Axis:   52 Text Interpretation: Sinus rhythm Probable anteroseptal infarct, old since last tracing no significant change Confirmed by Rolan Bucco [id number]) on 09/09/2021 9:08:20 AM  Radiology No results found.  Procedures Procedures    Medications Ordered in ED Medications  sodium chloride 0.9 % bolus 1,000 mL (0 mLs Intravenous Stopped 09/09/21 0948)  ondansetron (ZOFRAN) injection 4 mg (4 mg Intravenous Given 09/09/21 0805)  sodium chloride 0.9 % bolus 1,000 mL (0 mLs Intravenous Stopped 09/09/21 1317)  insulin aspart (novoLOG) injection 5 Units (5 Units Subcutaneous Given 09/09/21 1144)    ED Course/ Medical Decision Making/ A&P                           Medical Decision Making Amount and/or Complexity of Data Reviewed Labs: ordered.  Risk Prescription drug management. Decision regarding hospitalization.   Patient is a 56 year old male who presents with nausea and vomiting.  He has a history of diabetes and prior DKA.  His blood glucose was elevated into the 300 range.  He was started on IV fluids.  His labs show some acidosis with a low bicarb and a high anion gap.  However his pH was normal.  He was given a second liter of IV fluids and a dose of subcu insulin.  He was feeling better after this and has no ongoing vomiting.  However after this treatment, repeat BMP was performed and still shows acidosis with a low bicarb and a high anion gap.  His blood pressure is borderline low in the 90s.  He does not have any suggestions of infection.  I suspect he will need further treatment in the hospital.  His creatinine is elevated  but actually better than prior values.  I spoke with Dr. Ronaldo Miyamoto who will admit the patient for further treatment.  CRITICAL CARE Performed by: Rolan Bucco Total critical care time: 80 minutes Critical care time was exclusive of separately billable procedures and treating other patients. Critical care was necessary to treat or prevent imminent or life-threatening deterioration. Critical care was time spent personally by me on the following activities: development of treatment plan with patient and/or surrogate as well as nursing, discussions with consultants, evaluation of patient's response to treatment, examination of patient, obtaining history from patient or surrogate, ordering and performing treatments and interventions, ordering and review of laboratory studies, ordering and review of radiographic studies, pulse oximetry and re-evaluation of patient's condition.   Final Clinical Impression(s) / ED Diagnoses Final diagnoses:  Diabetic ketoacidosis without coma associated with other specified diabetes mellitus [facility])    Rx / DC Orders ED Discharge Orders     None         Rolan Bucco, MD 09/09/21 [phone]

## 2021-09-09 NOTE — Progress Notes (Signed)
Introduced myself to pt this shift and explained he will be transferring to ICU stepdown soon. Pt understands, but states he will leave AMA if they don't give him any food. He does not agree with being NPO. I told pt I will notify the RN when they can take report.

## 2021-09-09 NOTE — ED Notes (Signed)
Pt able to drink water  

## 2021-09-09 NOTE — H&P (Addendum)
History and Physical    Patient: James Bradford VFI:[id number] DOB: 13-Apr-1965 DOA: 09/09/2021 DOS: the patient was seen and examined on 09/09/2021 PCP: Medicine, [facility]  Patient coming from: Home  Chief Complaint:  Chief Complaint  Patient presents with   Nausea   HPI: James Bradford is a 56 y.o. male with medical history significant of DM2, HTN, HLD. Presenting with nausea and vomiting. He reports that he was in his normal state of health until last night after dinner. He started having nausea and vomiting. He felt chills. He didn't have any fevers or diarrhea. He didn't have any sick contacts. He says he has been compliant on his DM medication. He didn't try any medications for his nausea. He tried to sleep, but he couldn't. When the nausea persisted through this morning, he decided to come to the ED for assistance. He denies any other aggravating or alleviating factors.    Review of Systems: As mentioned in the history of present illness. All other systems reviewed and are negative. Past Medical History:  Diagnosis Date   Aortic atherosclerosis (HCC) 08/24/2020   Hyperlipidemia    Hypertension    Marijuana abuse 09/28/2020   Stage 3 chronic kidney disease (HCC)    Type 2 diabetes mellitus (HCC)    Unspecified glaucoma 02/18/2008   Qualifier: Diagnosis of  By: Daphine Deutscher FNP, Zena Amos     Past Surgical History:  Procedure Laterality Date   HAND SURGERY     LEG SURGERY     Social History:  reports that he has never smoked. He has never used smokeless tobacco. He reports that he does not drink alcohol and does not use drugs.  Allergies  Allergen Reactions   Ibuprofen Other (See Comments)    Hurts his stomach    Pork-Derived Products     Patient does not eat pork products 2/2 to religious beliefs. Wants to avoid all pork containing medications    Aspirin Other (See Comments)    Stomach irritation    Family History  Problem Relation Age of Onset   Hypertension  Mother    Pancreatic cancer Father     Prior to Admission medications   Medication Sig Start Date End Date Taking? Authorizing Provider  acetaminophen (TYLENOL) 500 MG tablet Take 500 mg by mouth every 6 (six) hours as needed for moderate pain.   Yes [provider]  BLACK CURRANT SEED OIL PO Take 5 mLs by mouth daily.   Yes [provider]  dicyclomine (BENTYL) 20 MG tablet Take 1 tablet (20 mg total) by mouth 2 (two) times daily. 12/25/19  Yes Moshe Cipro, NP  glipiZIDE (GLUCOTROL) 10 MG tablet Take 10 mg by mouth 2 (two) times daily. 08/03/20  Yes [provider]  KRILL OIL PO Take 1 capsule by mouth daily.   Yes [provider]  lisinopril (ZESTRIL) 10 MG tablet Take 1 tablet (10 mg total) by mouth daily. 04/24/19  Yes Cresenzo, Cyndi Lennert, MD  metFORMIN (GLUCOPHAGE) 1000 MG tablet Take 1 tablet (1,000 mg total) by mouth 2 (two) times daily with a meal. Patient taking differently: Take 500 mg by mouth 2 (two) times daily with a meal. 04/24/19  Yes Cresenzo, Cyndi Lennert, MD  Multiple Vitamin (MULTIVITAMIN ADULT PO) Take 1 tablet by mouth daily.   Yes [provider]  pravastatin (PRAVACHOL) 10 MG tablet Take 1 tablet (10 mg total) by mouth daily. 04/24/19  Yes Cresenzo, Cyndi Lennert, MD  sildenafil (VIAGRA) 100 MG tablet  Take 100 mg by mouth daily as needed for erectile dysfunction. 12/21/19  Yes [provider]  Accu-Chek Softclix Lancets lancets Use as instructed 04/24/19   Celedonio Savage, MD  glucose blood (ACCU-CHEK GUIDE) test strip Use as instructed 04/24/19   Celedonio Savage, MD    Physical Exam: Vitals:   09/09/21 1030 09/09/21 1139 09/09/21 1200 09/09/21 1300  BP:   99/67 91/75  Pulse: (!) 56  92 95  Resp: 18  18 18   Temp:  98 F (36.7 C)    TempSrc:      SpO2: 100%  100% 100%  Weight:      Height:       General: 56 y.o. male resting in bed in NAD Eyes: PERRL, normal sclera ENMT: Nares patent w/o discharge, orophaynx clear,  dentition normal, ears w/o discharge/lesions/ulcers Neck: Supple, trachea midline Cardiovascular: RRR, +S1, S2, no m/g/r, equal pulses throughout Respiratory: CTABL, no w/r/r, normal WOB GI: BS+, NDNT, no masses noted, no organomegaly noted MSK: No e/c/c Neuro: A&O x 3, no focal deficits Psyc: Appropriate interaction and affect, calm/cooperative  Data Reviewed:  Lab Results  Component Value Date   NA 138 09/09/2021   K 4.0 09/09/2021   CO2 15 (L) 09/09/2021   GLUCOSE 219 (H) 09/09/2021   BUN 20 09/09/2021   CREATININE 1.33 (H) 09/09/2021   CALCIUM 8.9 09/09/2021   GFRNONAA >60 09/09/2021   Lab Results  Component Value Date   ALT 34 09/09/2021   AST 31 09/09/2021   ALKPHOS 63 09/09/2021   BILITOT 0.8 09/09/2021   Lab Results  Component Value Date   WBC 6.4 09/09/2021   HGB 11.9 (L) 09/09/2021   HCT 35.0 (L) 09/09/2021   MCV 96.9 09/09/2021   PLT 265 09/09/2021   EKG: sinus, no st elevations  Assessment and Plan: Early DKA Uncontrolled DM2     - place in obs, tele     - he is normally on metformin, glypizide; 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 NPO except for non-caloric fluids.   N/V     - he is tolerating fluids now     - anti-emetics PRN  CKD 3a     - baseline is 1.2 - 1.4; he is at baseline, follow  Lactic acidosis     - question if metformin is playing at role here as he has no signs of infection; hold metformin     - fluids     - check UA  HTN     - BP is normotensive; resume home regimen in AM  HLD     -  resume home regimen  Normocytic anemia     - no evidence of bleed     - check iron studies   Advance Care Planning:   Code Status: DNR, confirmed through multiple lines of questioning  Consults: None  Family Communication: None  at bedside  Severity of Illness: The appropriate patient status for this patient is OBSERVATION. Observation status is judged to be reasonable and necessary in order to provide the required intensity of service to ensure the patient's safety. The patient's presenting symptoms, physical exam findings, and initial radiographic and laboratory data in the context of their medical condition is felt to place them at decreased risk for further clinical deterioration. Furthermore, it is anticipated that the patient will be medically stable for discharge from the hospital within 2 midnights of admission.   Author: Jonnie Finner, DO 09/09/2021 2:01 PM  For on call review [email or web address].

## 2021-09-10 DIAGNOSIS — E1310 Other specified diabetes mellitus with ketoacidosis without coma: Secondary | ICD-10-CM | POA: Diagnosis not present

## 2021-09-10 DIAGNOSIS — R112 Nausea with vomiting, unspecified: Secondary | ICD-10-CM | POA: Diagnosis not present

## 2021-09-10 DIAGNOSIS — I10 Essential (primary) hypertension: Secondary | ICD-10-CM | POA: Diagnosis not present

## 2021-09-10 LAB — BASIC METABOLIC PANEL
Anion gap: 9 (ref 5–15)
Anion gap: 10 (ref 5–15)
BUN: 17 mg/dL (ref 6–20)
BUN: 16 mg/dL (ref 6–20)
CO2: 19 mmol/L — ABNORMAL LOW (ref 22–32)
CO2: 21 mmol/L — ABNORMAL LOW (ref 22–32)
Calcium: 9.1 mg/dL (ref 8.9–10.3)
Calcium: 9.6 mg/dL (ref 8.9–10.3)
Chloride: 110 mmol/L (ref 98–111)
Chloride: 104 mmol/L (ref 98–111)
Creatinine, Ser: 1.2 mg/dL (ref 0.61–1.24)
Creatinine, Ser: 1.28 mg/dL — ABNORMAL HIGH (ref 0.61–1.24)
GFR, Estimated: >60 mL/min (ref >60)
GFR, Estimated: >60 mL/min (ref >60)
Glucose, Bld: 150 mg/dL — ABNORMAL HIGH (ref 70–99)
Glucose, Bld: 191 mg/dL — ABNORMAL HIGH (ref 70–99)
Potassium: 3.9 mmol/L (ref 3.5–5.1)
Potassium: 4.1 mmol/L (ref 3.5–5.1)
Sodium: 138 mmol/L (ref 135–145)
Sodium: 135 mmol/L (ref 135–145)

## 2021-09-10 LAB — COMPREHENSIVE METABOLIC PANEL
ALT: 29 U/L (ref 0–44)
AST: 28 U/L (ref 15–41)
Albumin: 3.9 g/dL (ref 3.5–5.0)
Alkaline Phosphatase: 54 U/L (ref 38–126)
Anion gap: 9 (ref 5–15)
BUN: 19 mg/dL (ref 6–20)
CO2: 22 mmol/L (ref 22–32)
Calcium: 9.3 mg/dL (ref 8.9–10.3)
Chloride: 108 mmol/L (ref 98–111)
Creatinine, Ser: 1.39 mg/dL — ABNORMAL HIGH (ref 0.61–1.24)
GFR, Estimated: >60 mL/min (ref >60)
Glucose, Bld: 143 mg/dL — ABNORMAL HIGH (ref 70–99)
Potassium: 4 mmol/L (ref 3.5–5.1)
Sodium: 139 mmol/L (ref 135–145)
Total Bilirubin: 0.7 mg/dL (ref 0.3–1.2)
Total Protein: 6.8 g/dL (ref 6.5–8.1)

## 2021-09-10 LAB — GLUCOSE, CAPILLARY
Glucose-Capillary: 112 mg/dL — ABNORMAL HIGH (ref 70–99)
Glucose-Capillary: 120 mg/dL — ABNORMAL HIGH (ref 70–99)
Glucose-Capillary: 135 mg/dL — ABNORMAL HIGH (ref 70–99)
Glucose-Capillary: 145 mg/dL — ABNORMAL HIGH (ref 70–99)
Glucose-Capillary: 153 mg/dL — ABNORMAL HIGH (ref 70–99)
Glucose-Capillary: 153 mg/dL — ABNORMAL HIGH (ref 70–99)
Glucose-Capillary: 154 mg/dL — ABNORMAL HIGH (ref 70–99)
Glucose-Capillary: 182 mg/dL — ABNORMAL HIGH (ref 70–99)
Glucose-Capillary: 217 mg/dL — ABNORMAL HIGH (ref 70–99)
Glucose-Capillary: 270 mg/dL — ABNORMAL HIGH (ref 70–99)
Glucose-Capillary: 97 mg/dL (ref 70–99)

## 2021-09-10 LAB — BETA-HYDROXYBUTYRIC ACID
Beta-Hydroxybutyric Acid: 0.46 mmol/L — ABNORMAL HIGH (ref 0.05–0.27)
Beta-Hydroxybutyric Acid: 1.85 mmol/L — ABNORMAL HIGH (ref 0.05–0.27)

## 2021-09-10 LAB — CBC
HCT: 33 % — ABNORMAL LOW (ref 39.0–52.0)
Hemoglobin: 11 g/dL — ABNORMAL LOW (ref 13.0–17.0)
MCH: 32.2 pg (ref 26.0–34.0)
MCHC: 33.3 g/dL (ref 30.0–36.0)
MCV: 96.5 fL (ref 80.0–100.0)
Platelets: 260 10*3/uL (ref 150–400)
RBC: 3.42 MIL/uL — ABNORMAL LOW (ref 4.22–5.81)
RDW: 14.3 % (ref 11.5–15.5)
WBC: 7 10*3/uL (ref 4.0–10.5)
nRBC: 0 % (ref 0.0–0.2)

## 2021-09-10 LAB — MRSA NEXT GEN BY PCR, NASAL: MRSA by PCR Next Gen: NOT DETECTED

## 2021-09-10 MED ORDER — INSULIN GLARGINE-YFGN 100 UNIT/ML ~~LOC~~ SOLN
10.0000 [IU] | Freq: Every day | SUBCUTANEOUS | Status: DC
  Administered 2021-09-10: 10 [IU] via SUBCUTANEOUS
  Filled 2021-09-10: qty 0.1

## 2021-09-10 MED ORDER — BLOOD GLUCOSE MONITOR KIT: PACK | 0 refills | Status: DC

## 2021-09-10 MED ORDER — METFORMIN HCL 500 MG PO TABS: 500.0000 mg | ORAL_TABLET | Freq: Two times a day (BID) | ORAL | 3 refills | Status: DC

## 2021-09-10 MED ORDER — INSULIN ASPART 100 UNIT/ML IJ SOLN: 0.0000 [IU] | Freq: Three times a day (TID) | INTRAMUSCULAR | Status: DC

## 2021-09-10 MED ORDER — BASAGLAR KWIKPEN 100 UNIT/ML ~~LOC~~ SOPN: 10.0000 [IU] | PEN_INJECTOR | Freq: Every day | SUBCUTANEOUS | 1 refills | Status: DC

## 2021-09-10 MED ORDER — INSULIN ASPART 100 UNIT/ML IJ SOLN
3.0000 [IU] | Freq: Three times a day (TID) | INTRAMUSCULAR | Status: DC
  Administered 2021-09-10: 3 [IU] via SUBCUTANEOUS

## 2021-09-10 MED ORDER — INSULIN PEN NEEDLE 32G X 4 MM MISC: 3 refills | Status: DC

## 2021-09-10 MED ORDER — INSULIN ASPART 100 UNIT/ML IJ SOLN
0.0000 [IU] | Freq: Three times a day (TID) | INTRAMUSCULAR | Status: DC
  Administered 2021-09-10: 2 [IU] via SUBCUTANEOUS

## 2021-09-10 NOTE — Inpatient Diabetes Management (Signed)
Inpatient Diabetes Program Recommendations  AACE/ADA: New Consensus Statement on Inpatient Glycemic Control (2015)  Target Ranges:  Prepandial:   less than 140 mg/dL      Peak postprandial:   less than 180 mg/dL (1-2 hours)      Critically ill patients:  140 - 180 mg/dL   Lab Results  Component Value Date   GLUCAP 153 (H) 09/10/2021   HGBA1C 13.5 (H) 09/09/2021    Review of Glycemic Control  Diabetes history: DM2 Outpatient Diabetes medications: glipizide 10 BID, metformin 500 BID Current orders for Inpatient glycemic control: IV insulin per EndoTool for DKA  HgbA1C - 13.5% (up from 9.6% on 01/12/21)  Inpatient Diabetes Program Recommendations:    When ready to transition off drip to SQ insulin, give Semglee 1-2 H prior to discontinuation of drip  Semglee 10 units QD  Novolog 0-6 units Q4H x 12H, then ac/hs  Novolog 3 units TID with meals when diet advanced to CHO mod.  Will speak with pt at bedside today regarding his diabetes and HgbA1C of 13.5%. Will likely need to be discharged on insulin.  Continue to follow glucose trends.  Thank you. Ailene Ards, RD, LDN, CDE Inpatient Diabetes Coordinator [phone]

## 2021-09-10 NOTE — Discharge Summary (Signed)
Physician Discharge Summary   Patient: James Bradford MRN: [id number] DOB: 06-02-1965  Admit date:     09/09/2021  Discharge date: 09/10/21  Discharge Physician: Oswald Hillock   PCP: Medicine, [facility]   Recommendations at discharge:   Follow-up endocrinology Dr. Meredith Pel as outpatient Start taking long-acting insulin Basaglar 10 units subcu daily along with metformin 500 mg p.o. twice daily Check blood glucose every day at home  Discharge Diagnoses: Principal Problem:   Nausea & vomiting Active Problems:   Essential hypertension   Hyperlipidemia   Stage 3 chronic kidney disease (HCC)   Type 2 diabetes mellitus (HCC)   Normocytic anemia   DKA (diabetic ketoacidosis) ([facility])  Resolved Problems:   * No resolved hospital problems. *  Hospital Course: 56 y.o. male with medical history significant of DM2, HTN, HLD. Presenting with nausea and vomiting. He reports that he was in his normal state of health until last night after dinner. He started having nausea and vomiting. He felt chills. He didn't have any fevers or diarrhea. He didn't have any sick contacts. He says he has been compliant on his DM medication. He didn't try any medications for his nausea. He tried to sleep, but he couldn't. When the nausea persisted through this morning, he decided to come to the ED for assistance. He denies any other aggravating or alleviating factors.   Assessment and Plan:  Early diabetic ketoacidosis -Patient presented with nausea vomiting, mild acidosis -CO2 was 16, anion gap of 16 -Beta-hydroxybutyrate was almost 5 -Patient was started on IV insulin, Endo tool -Diabetic ketoacidosis resolved, last beta-hydroxybutyrate was 0.46 -Blood glucose had normalized, IV insulin was discontinued -Patient started on Lantus 10 units subcu daily  Uncontrolled diabetes mellitus type 2 -Patient's hemoglobin A1c was elevated at 13.5 -I called and discussed with endocrinologist Dr. Buddy Duty, who did  not feel that patient was having metabolic acidosis lactic acidosis -His partner Dr. Meredith Pel will see the patient as outpatient -Will discharge him on Lantus 10 units subcu daily, metformin 500 mg p.o. twice daily -Will discontinue glipizide 10 mg twice daily  Intractable nausea vomiting-resolved Hypertension-blood pressure stable, continue home regimen       Consultants: None Procedures performed: None Disposition: Home Diet recommendation:  Discharge Diet Orders (From admission, onward)     Start     Ordered   09/10/21 0000  Diet - low sodium heart healthy        09/10/21 1434           Carb modified diet DISCHARGE MEDICATION: Allergies as of 09/10/2021       Reactions   Ibuprofen Other (See Comments)   Hurts his stomach   Pork-derived Products    Patient does not eat pork products 2/2 to religious beliefs. Wants to avoid all pork containing medications    Aspirin Other (See Comments)   Stomach irritation        Medication List     STOP taking these medications    glipiZIDE 10 MG tablet Commonly known as: GLUCOTROL       TAKE these medications    Accu-Chek Guide test strip Generic drug: glucose blood Use as instructed   Accu-Chek Softclix Lancets lancets Use as instructed   acetaminophen 500 MG tablet Commonly known as: TYLENOL Take 500 mg by mouth every 6 (six) hours as needed for moderate pain.   Basaglar KwikPen 100 UNIT/ML Inject 10 Units into the skin daily.   BLACK CURRANT SEED OIL PO Take 5  mLs by mouth daily.   blood glucose meter kit and supplies Kit Dispense based on patient and insurance preference. Use up to four times daily as directed.   dicyclomine 20 MG tablet Commonly known as: BENTYL Take 1 tablet (20 mg total) by mouth 2 (two) times daily.   Insulin Pen Needle 32G X 4 MM Misc Use with Insulin pen   KRILL OIL PO Take 1 capsule by mouth daily.   lisinopril 10 MG tablet Commonly known as: ZESTRIL Take 1 tablet (10 mg  total) by mouth daily.   metFORMIN 500 MG tablet Commonly known as: Glucophage Take 1 tablet (500 mg total) by mouth 2 (two) times daily with a meal. What changed:  medication strength how much to take   MULTIVITAMIN ADULT PO Take 1 tablet by mouth daily.   pravastatin 10 MG tablet Commonly known as: PRAVACHOL Take 1 tablet (10 mg total) by mouth daily.   sildenafil 100 MG tablet Commonly known as: VIAGRA Take 100 mg by mouth daily as needed for erectile dysfunction.        Follow-up Information     Alanson Aly, MD. Schedule an appointment as soon as possible for a visit.   Specialty: Internal Medicine Contact information: [address] [place] [place] [address] [phone]                Discharge Exam: [name] Weights   09/09/21 0017 09/09/21 1647 09/09/21 2138  Weight: 68 kg 68.4 kg 68.4 kg   General-appears in no acute distress Heart-S1-S2, regular, no murmur auscultated Lungs-clear to auscultation bilaterally, no wheezing or crackles auscultated Abdomen-soft, nontender, no organomegaly Extremities-no edema in the lower extremities Neuro-alert, oriented x3, no focal deficit noted  Condition at discharge: good  The results of significant diagnostics from this hospitalization (including imaging, microbiology, ancillary and laboratory) are listed below for reference.   Imaging Studies: No results found.  Microbiology: Results for orders placed or performed during the hospital encounter of 09/09/21  MRSA Next Gen by PCR, Nasal     Status: None   Collection Time: 09/09/21 10:43 PM   Specimen: Nasal Mucosa; Nasal Swab  Result Value Ref Range Status   MRSA by PCR Next Gen NOT DETECTED NOT DETECTED Final    Comment: (NOTE) The GeneXpert MRSA Assay (FDA approved for NASAL specimens only), is one component of a comprehensive MRSA colonization surveillance program. It is not intended to diagnose MRSA infection nor to guide or monitor treatment  for MRSA infections. Test performance is not FDA approved in patients less than 67 years old. Performed at [facility], [place] [address], [place], [place] [address]     Labs: CBC: Recent Labs  Lab 09/09/21 [phone] 09/09/21 0807 09/10/21 0218  WBC 6.4  --  7.0  NEUTROABS 5.4  --   --   HGB 11.1* 11.9* 11.0*  HCT 34.4* 35.0* 33.0*  MCV 96.9  --  96.5  PLT 265  --  163   Basic Metabolic Panel: Recent Labs  Lab 09/09/21 1742 09/09/21 2116 09/10/21 0218 09/10/21 0542 09/10/21 0945  NA 138 136 139 138 135  K 4.1 4.6 4.0 3.9 4.1  CL 106 106 108 110 104  CO2 17* 20* 22 19* 21*  GLUCOSE 156* 155* 143* 150* 191*  BUN 22* 22* $Remov'19 17 16  'wZnEXr$ CREATININE 1.54* 1.32* 1.39* 1.20 1.28*  CALCIUM 9.7 8.9 9.3 9.1 9.6   Liver Function Tests: Recent Labs  Lab 09/09/21 0728 09/10/21 8466  AST 31 28  ALT 34 29  ALKPHOS 63 54  BILITOT 0.8 0.7  PROT 8.0 6.8  ALBUMIN 4.6 3.9   CBG: Recent Labs  Lab 09/10/21 0750 09/10/21 0858 09/10/21 0957 09/10/21 1056 09/10/21 1158  GLUCAP 112* 97 217* 270* 182*    Discharge time spent: greater than 30 minutes.  Signed: Oswald Hillock, MD [facility] 09/10/2021

## 2021-09-10 NOTE — TOC Initial Note (Signed)
Transition of Care [facility]) - Initial/Assessment Note    Patient Details  Name: James Bradford MRN: [id number] Date of Birth: 1965-10-01  Transition of Care [facility]) CM/SW Contact:    Golda Acre, RN Phone Number: 09/10/2021, 7:52 AM  Clinical Narrative:                  Transition of Care [facility]) Screening Note   Patient Details  Name: James Bradford Date of Birth: Mar 21, 1965   Transition of Care [facility]) CM/SW Contact:    Golda Acre, RN Phone Number: 09/10/2021, 7:52 AM    Transition of Care Department [facility]) has reviewed patient and no TOC needs have been identified at this time. We will continue to monitor patient advancement through interdisciplinary progression rounds. If new patient transition needs arise, please place a TOC consult.    Expected Discharge Plan: Home/Self Care Barriers to Discharge: Continued Medical Work up   Patient Goals and CMS Choice Patient states their goals for this hospitalization and ongoing recovery are:: to go home and be well CMS Medicare.gov Compare Post Acute Care list provided to:: Patient    Expected Discharge Plan and Services Expected Discharge Plan: Home/Self Care   Discharge Planning Services: CM Consult   Living arrangements for the past 2 months: Single Family Home                                      Prior Living Arrangements/Services Living arrangements for the past 2 months: Single Family Home Lives with:: Self Patient language and need for interpreter reviewed:: Yes Do you feel safe going back to the place where you live?: Yes            Criminal Activity/Legal Involvement Pertinent to Current Situation/Hospitalization: No - Comment as needed  Activities of Daily Living      Permission Sought/Granted                  Emotional Assessment Appearance:: Appears stated age Attitude/Demeanor/Rapport: Engaged Affect (typically observed): Calm Orientation: : Oriented to Place, Oriented to  Self, Oriented to  Time, Oriented to Situation Alcohol / Substance Use: Never Used Psych Involvement: No (comment)  Admission diagnosis:  Nausea & vomiting [R11.2] Diabetic ketoacidosis without coma associated with other specified diabetes mellitus (HCC) [E13.10] Patient Active Problem List   Diagnosis Date Noted   Nausea & vomiting 09/09/2021   Ketoacidosis, diabetic, type 2, no coma (HCC) 01/13/2021   Hyperglycemia 01/12/2021   DKA (diabetic ketoacidosis) (HCC) 01/12/2021   Intractable nausea and vomiting 09/28/2020   Marijuana abuse 09/28/2020   Sinus bradycardia 08/24/2020   Aortic atherosclerosis (HCC) 08/24/2020   AKI (acute kidney injury) (HCC) 08/23/2020   Type 2 diabetes mellitus (HCC) 08/23/2020   Normocytic anemia 08/23/2020   Type 2 diabetes mellitus with hyperosmolar hyperglycemic state (HHS) (HCC) 04/23/2019   Hyperlipidemia    Stage 3 chronic kidney disease (HCC)    HYPERCHOLESTEROLEMIA 02/26/2009   ERECTILE DYSFUNCTION 04/27/2008   Unspecified glaucoma 02/18/2008   Essential hypertension 02/16/2008   Uncontrolled type 2 diabetes mellitus 02/03/2005   PCP:  Med[facility], [facility]:   [facility] [address] [place], [place] - [address] [place] [address] Phone: [phone] Fax: [phone]  [facility] [address] [place] [address] Phone: [phone] Fax: [phone]     Social Determinants of  Health (SDOH) Interventions    Readmission Risk Interventions     No data to display

## 2021-09-10 NOTE — Inpatient Diabetes Management (Signed)
Inpatient Diabetes Program Recommendations  AACE/ADA: New Consensus Statement on Inpatient Glycemic Control (2015)  Target Ranges:  Prepandial:   less than 140 mg/dL      Peak postprandial:   less than 180 mg/dL (1-2 hours)      Critically ill patients:  140 - 180 mg/dL   Lab Results  Component Value Date   GLUCAP 182 (H) 09/10/2021   HGBA1C 13.5 (H) 09/09/2021    Review of Glycemic Control  Pt being discharged on Basaglar 10 units QD, metformin 500 mg BID. Pt is new to insulin.  Educated patient on insulin pen use at home. Reviewed contents of insulin flexpen starter kit. Reviewed all steps if insulin pen including attachment of needle, 2-unit air shot, dialing up dose, giving injection, removing needle, disposal of sharps, storage of unused insulin, disposal of insulin etc. Patient able to provide successful return demonstration. Also reviewed troubleshooting with insulin pen. MD to give patient Rxs for insulin pens and insulin pen needles.   Thank you. Lorenda Peck, RD, LDN, CDE Inpatient Diabetes Coordinator [phone]

## 2021-10-03 ENCOUNTER — Emergency Department (HOSPITAL_COMMUNITY)
Admission: EM | Admit: 2021-10-03 | Discharge: 2021-10-03 | Disposition: A | Discharge status: Home / Self Care | Payer: Commercial Managed Care - HMO | Attending: Emergency Medicine | Admitting: Emergency Medicine

## 2021-10-03 ENCOUNTER — Emergency Department (HOSPITAL_COMMUNITY): Payer: Commercial Managed Care - HMO

## 2021-10-03 ENCOUNTER — Encounter (HOSPITAL_COMMUNITY): Payer: Self-pay

## 2021-10-03 DIAGNOSIS — Z79899 Other long term (current) drug therapy: Secondary | ICD-10-CM | POA: Insufficient documentation

## 2021-10-03 DIAGNOSIS — I10 Essential (primary) hypertension: Secondary | ICD-10-CM | POA: Diagnosis not present

## 2021-10-03 DIAGNOSIS — R112 Nausea with vomiting, unspecified: Secondary | ICD-10-CM | POA: Insufficient documentation

## 2021-10-03 DIAGNOSIS — Z7984 Long term (current) use of oral hypoglycemic drugs: Secondary | ICD-10-CM | POA: Diagnosis not present

## 2021-10-03 DIAGNOSIS — R1084 Generalized abdominal pain: Secondary | ICD-10-CM | POA: Diagnosis not present

## 2021-10-03 DIAGNOSIS — Z794 Long term (current) use of insulin: Secondary | ICD-10-CM | POA: Insufficient documentation

## 2021-10-03 DIAGNOSIS — E119 Type 2 diabetes mellitus without complications: Secondary | ICD-10-CM | POA: Insufficient documentation

## 2021-10-03 HISTORY — DX: Bradycardia, unspecified: R00.1

## 2021-10-03 LAB — CBC WITH DIFFERENTIAL/PLATELET
Abs Immature Granulocytes: 0.02 10*3/uL (ref 0.00–0.07)
Basophils Absolute: 0 10*3/uL (ref 0.0–0.1)
Basophils Relative: 0 %
Eosinophils Absolute: 0.1 10*3/uL (ref 0.0–0.5)
Eosinophils Relative: 2 %
HCT: 37.2 % — ABNORMAL LOW (ref 39.0–52.0)
Hemoglobin: 11.9 g/dL — ABNORMAL LOW (ref 13.0–17.0)
Immature Granulocytes: 0 %
Lymphocytes Relative: 28 %
Lymphs Abs: 1.7 10*3/uL (ref 0.7–4.0)
MCH: 31.6 pg (ref 26.0–34.0)
MCHC: 32 g/dL (ref 30.0–36.0)
MCV: 98.7 fL (ref 80.0–100.0)
Monocytes Absolute: 0.5 10*3/uL (ref 0.1–1.0)
Monocytes Relative: 9 %
Neutro Abs: 3.6 10*3/uL (ref 1.7–7.7)
Neutrophils Relative %: 61 %
Platelets: 272 10*3/uL (ref 150–400)
RBC: 3.77 MIL/uL — ABNORMAL LOW (ref 4.22–5.81)
RDW: 14.6 % (ref 11.5–15.5)
WBC: 5.9 10*3/uL (ref 4.0–10.5)
nRBC: 0 % (ref 0.0–0.2)

## 2021-10-03 LAB — COMPREHENSIVE METABOLIC PANEL
ALT: 29 U/L (ref 0–44)
AST: 23 U/L (ref 15–41)
Albumin: 4.5 g/dL (ref 3.5–5.0)
Alkaline Phosphatase: 61 U/L (ref 38–126)
Anion gap: 8 (ref 5–15)
BUN: 20 mg/dL (ref 6–20)
CO2: 24 mmol/L (ref 22–32)
Calcium: 9.5 mg/dL (ref 8.9–10.3)
Chloride: 107 mmol/L (ref 98–111)
Creatinine, Ser: 1.21 mg/dL (ref 0.61–1.24)
GFR, Estimated: >60 mL/min (ref >60)
Glucose, Bld: 248 mg/dL — ABNORMAL HIGH (ref 70–99)
Potassium: 3.9 mmol/L (ref 3.5–5.1)
Sodium: 139 mmol/L (ref 135–145)
Total Bilirubin: 0.6 mg/dL (ref 0.3–1.2)
Total Protein: 7.6 g/dL (ref 6.5–8.1)

## 2021-10-03 LAB — ETHANOL: Alcohol, Ethyl (B): <10 mg/dL (ref <10)

## 2021-10-03 LAB — LIPASE, BLOOD: Lipase: 42 U/L (ref 11–51)

## 2021-10-03 MED ORDER — IOHEXOL 300 MG/ML  SOLN
100.0000 mL | Freq: Once | INTRAMUSCULAR | Status: AC | PRN
  Administered 2021-10-03: 100 mL via INTRAVENOUS

## 2021-10-03 MED ORDER — ONDANSETRON 8 MG PO TBDP
ORAL_TABLET | ORAL | 0 refills | Status: DC
Start: 2021-10-03 — End: 2021-12-13

## 2021-10-03 MED ORDER — ONDANSETRON HCL 4 MG/2ML IJ SOLN
4.0000 mg | Freq: Once | INTRAMUSCULAR | Status: AC
Start: 2021-10-03 — End: 2021-10-03
  Administered 2021-10-03: 4 mg via INTRAVENOUS
  Filled 2021-10-03: qty 2

## 2021-10-03 MED ORDER — SODIUM CHLORIDE 0.9 % IV SOLN
12.5000 mg | Freq: Once | INTRAVENOUS | Status: AC
  Administered 2021-10-03: 12.5 mg via INTRAVENOUS
  Filled 2021-10-03: qty 0.5

## 2021-10-03 MED ORDER — SODIUM CHLORIDE 0.9 % IV BOLUS
1000.0000 mL | Freq: Once | INTRAVENOUS | Status: AC
  Administered 2021-10-03: 1000 mL via INTRAVENOUS

## 2021-10-03 MED ORDER — SODIUM CHLORIDE (PF) 0.9 % IJ SOLN
INTRAMUSCULAR | Status: AC
  Filled 2021-10-03: qty 50

## 2021-10-03 MED ORDER — MORPHINE SULFATE (PF) 4 MG/ML IV SOLN
4.0000 mg | Freq: Once | INTRAVENOUS | Status: AC
  Administered 2021-10-03: 4 mg via INTRAVENOUS
  Filled 2021-10-03: qty 1

## 2021-10-03 NOTE — ED Provider Notes (Signed)
  Physical Exam  BP (!) 155/83 (BP Location: Right Arm)   Pulse (!) 57   Temp 98.1 F (36.7 C) (Oral)   Resp 16   Ht 5\' 7"  (1.702 m)   Wt 68 kg   SpO2 98%   BMI 23.49 kg/m   Physical Exam  Procedures  Procedures  ED Course / MDM    Medical Decision Making Amount and/or Complexity of Data Reviewed Labs: ordered. Radiology: ordered.  Risk Prescription drug management.   Patient seen by Dr. with some dehydration.  With plan of DC after medication.  Discussed with nurse that if patient worsens before discharge it could provide further management but patient was able to be discharged as planned       Tami Ribas, MD 10/03/21 1527

## 2021-10-03 NOTE — ED Notes (Addendum)
Patient instructed on discharge teaching and given opportunity to ask questions. Patient verbalizes understanding. Patient ambulatory with steady gate, requests wheelchair to lobby d/t exhaustion. Patient given bus pass per request.

## 2021-10-03 NOTE — Discharge Instructions (Signed)
Begin taking Zofran as prescribed as needed for nausea.  Clear liquid diet for the next 12 hours, then slowly advance to normal as tolerated.  Return to the emergency department if you develop worsening abdominal pain, high fevers, bloody stools, or for other new and concerning symptoms.

## 2021-10-03 NOTE — ED Triage Notes (Signed)
Pt from home BIB [facility] for abd pain and n/v. Pt known diabetic, recently placed on insulin. Continues metformin. CBG 90 per EMS, Pt received 4mg  zofran IV, VSS, A&O x4.

## 2021-10-03 NOTE — ED Notes (Signed)
Unable to give phenergan at this time, awaiting delivery from pharmacy. Pharmacy aware.

## 2021-10-03 NOTE — ED Provider Notes (Signed)
[facility] DEPT Provider Note   CSN: [id number] Arrival date & time: 10/03/21  0403     History  Chief Complaint  Patient presents with   Abdominal Pain    James Bradford is a 56 y.o. male.  Patient is a 56 year old male with past medical history of poorly controlled diabetes, hypertension, hyperlipidemia.  Patient presenting today with complaints of abdominal pain, nausea, and vomiting that started acutely at approximately 1 AM.  He describes several episodes of emesis and one episode of loose stools.  He denies any bloody stool or vomit.  He denies fevers or chills.  He denies ill contacts or having consumed any undercooked or suspicious foods.  The history is provided by the patient.       Home Medications Prior to Admission medications   Medication Sig Start Date End Date Taking? Authorizing Provider  Accu-Chek Softclix Lancets lancets Use as instructed 04/24/19   Cresenzo, Angelyn Punt, MD  acetaminophen (TYLENOL) 500 MG tablet Take 500 mg by mouth every 6 (six) hours as needed for moderate pain.    [provider]  BLACK CURRANT SEED OIL PO Take 5 mLs by mouth daily.    [provider]  blood glucose meter kit and supplies KIT Dispense based on patient and insurance preference. Use up to four times daily as directed. 09/10/21   Oswald Hillock, MD  dicyclomine (BENTYL) 20 MG tablet Take 1 tablet (20 mg total) by mouth 2 (two) times daily. 12/25/19   Faustino Congress, NP  glucose blood (ACCU-CHEK GUIDE) test strip Use as instructed 04/24/19   Concepcion Living, MD  Insulin Glargine [facility]) 100 UNIT/ML Inject 10 Units into the skin daily. 09/10/21   Oswald Hillock, MD  Insulin Pen Needle 32G X 4 MM MISC Use with Insulin pen 09/10/21   Oswald Hillock, MD  KRILL OIL PO Take 1 capsule by mouth daily.    [provider]  lisinopril (ZESTRIL) 10 MG tablet Take 1 tablet (10 mg total) by mouth daily. 04/24/19   Concepcion Living, MD   metFORMIN (GLUCOPHAGE) 500 MG tablet Take 1 tablet (500 mg total) by mouth 2 (two) times daily with a meal. 09/10/21 01/08/22  Oswald Hillock, MD  Multiple Vitamin (MULTIVITAMIN ADULT PO) Take 1 tablet by mouth daily.    [provider]  pravastatin (PRAVACHOL) 10 MG tablet Take 1 tablet (10 mg total) by mouth daily. 04/24/19   Concepcion Living, MD  sildenafil (VIAGRA) 100 MG tablet Take 100 mg by mouth daily as needed for erectile dysfunction. 12/21/19   [provider]      Allergies    Ibuprofen, Pork-derived products, and Aspirin    Review of Systems   Review of Systems  All other systems reviewed and are negative.   Physical Exam Updated Vital Signs BP (!) 167/90 (BP Location: Left Arm)   Pulse (!) 54   Temp (!) 97.5 F (36.4 C) (Oral)   Resp 13   Ht $R'5\' 7"'vU$  (1.702 m)   Wt 68 kg   SpO2 100%   BMI 23.49 kg/m  Physical Exam Vitals and nursing note reviewed.  Constitutional:      General: He is not in acute distress.    Appearance: He is well-developed. He is not diaphoretic.  HENT:     Head: Normocephalic and atraumatic.  Cardiovascular:     Rate and Rhythm: Normal rate and regular rhythm.     Heart sounds:  No murmur heard.    No friction rub.  Pulmonary:     Effort: Pulmonary effort is normal. No respiratory distress.     Breath sounds: Normal breath sounds. No wheezing or rales.  Abdominal:     General: Bowel sounds are normal. There is no distension.     Palpations: Abdomen is soft.     Tenderness: There is generalized abdominal tenderness. There is no right CVA tenderness, left CVA tenderness, guarding or rebound.  Musculoskeletal:        General: Normal range of motion.     Cervical back: Normal range of motion and neck supple.  Skin:    General: Skin is warm and dry.  Neurological:     Mental Status: He is alert and oriented to person, place, and time.     Coordination: Coordination normal.     ED Results / Procedures / Treatments    Labs (all labs ordered are listed, but only abnormal results are displayed) Labs Reviewed - No data to display  EKG None  Radiology No results found.  Procedures Procedures    Medications Ordered in ED Medications  sodium chloride 0.9 % bolus 1,000 mL (has no administration in time range)  ondansetron (ZOFRAN) injection 4 mg (has no administration in time range)  morphine (PF) 4 MG/ML injection 4 mg (has no administration in time range)    ED Course/ Medical Decision Making/ A&P  Patient is a 56 year old male with past medical history as per HPI.  He presents today with nausea, vomiting, and abdominal discomfort that started acutely at 1 AM.  Patient has received IV fluids along with medicine for pain and nausea.  He seems to be feeling somewhat better.  Work-up initiated including laboratory studies, all of which were unremarkable and CT scan of the abdomen and pelvis which showed no acute process.  I have identified nothing on exam that is emergent or in need of hospitalization.  I feels the patient can safely be discharged with Zofran and as needed return.  Final Clinical Impression(s) / ED Diagnoses Final diagnoses:  None    Rx / DC Orders ED Discharge Orders     None         Veryl Speak, MD 10/04/21 [phone]

## 2021-10-10 ENCOUNTER — Emergency Department (HOSPITAL_COMMUNITY)
Admission: EM | Admit: 2021-10-10 | Discharge: 2021-10-10 | Discharge status: Against Medical Advice | Payer: Commercial Managed Care - HMO | Attending: Emergency Medicine | Admitting: Emergency Medicine

## 2021-10-10 ENCOUNTER — Other Ambulatory Visit: Payer: Self-pay

## 2021-10-10 ENCOUNTER — Encounter (HOSPITAL_COMMUNITY): Payer: Self-pay

## 2021-10-10 DIAGNOSIS — Z5321 Procedure and treatment not carried out due to patient leaving prior to being seen by health care provider: Secondary | ICD-10-CM | POA: Diagnosis not present

## 2021-10-10 DIAGNOSIS — R200 Anesthesia of skin: Secondary | ICD-10-CM | POA: Insufficient documentation

## 2021-10-10 LAB — COMPREHENSIVE METABOLIC PANEL
ALT: 24 U/L (ref 0–44)
AST: 28 U/L (ref 15–41)
Albumin: 4.3 g/dL (ref 3.5–5.0)
Alkaline Phosphatase: 58 U/L (ref 38–126)
Anion gap: 9 (ref 5–15)
BUN: 32 mg/dL — ABNORMAL HIGH (ref 6–20)
CO2: 23 mmol/L (ref 22–32)
Calcium: 9.7 mg/dL (ref 8.9–10.3)
Chloride: 107 mmol/L (ref 98–111)
Creatinine, Ser: 1.45 mg/dL — ABNORMAL HIGH (ref 0.61–1.24)
GFR, Estimated: 57 mL/min — ABNORMAL LOW (ref >60)
Glucose, Bld: 242 mg/dL — ABNORMAL HIGH (ref 70–99)
Potassium: 4.5 mmol/L (ref 3.5–5.1)
Sodium: 139 mmol/L (ref 135–145)
Total Bilirubin: 0.7 mg/dL (ref 0.3–1.2)
Total Protein: 7.6 g/dL (ref 6.5–8.1)

## 2021-10-10 LAB — CBC WITH DIFFERENTIAL/PLATELET
Abs Immature Granulocytes: 0.02 10*3/uL (ref 0.00–0.07)
Basophils Absolute: 0 10*3/uL (ref 0.0–0.1)
Basophils Relative: 0 %
Eosinophils Absolute: 0.1 10*3/uL (ref 0.0–0.5)
Eosinophils Relative: 2 %
HCT: 36 % — ABNORMAL LOW (ref 39.0–52.0)
Hemoglobin: 12 g/dL — ABNORMAL LOW (ref 13.0–17.0)
Immature Granulocytes: 0 %
Lymphocytes Relative: 30 %
Lymphs Abs: 1.4 10*3/uL (ref 0.7–4.0)
MCH: 32.3 pg (ref 26.0–34.0)
MCHC: 33.3 g/dL (ref 30.0–36.0)
MCV: 96.8 fL (ref 80.0–100.0)
Monocytes Absolute: 0.4 10*3/uL (ref 0.1–1.0)
Monocytes Relative: 8 %
Neutro Abs: 2.9 10*3/uL (ref 1.7–7.7)
Neutrophils Relative %: 60 %
Platelets: 301 10*3/uL (ref 150–400)
RBC: 3.72 MIL/uL — ABNORMAL LOW (ref 4.22–5.81)
RDW: 14 % (ref 11.5–15.5)
WBC: 4.8 10*3/uL (ref 4.0–10.5)
nRBC: 0 % (ref 0.0–0.2)

## 2021-10-10 LAB — CBG MONITORING, ED: Glucose-Capillary: 243 mg/dL — ABNORMAL HIGH (ref 70–99)

## 2021-10-10 NOTE — ED Triage Notes (Addendum)
Patient states he is having a reaction to his insulin. Patient states he was seen 2-3 weeks ago and states he told the doctor that he did not want insulin and now he is having a reaction.   Patient added that he is having abdominal pain as well that started 3-4 days ago.  Patient states that he is having itching in his fingertips  toes, pain in his legs, and  states that he has things growing on his palms and fingers (resemble calluses).

## 2021-10-10 NOTE — ED Provider Triage Note (Signed)
Emergency Medicine Provider Triage Evaluation Note  Jadarian Mckay , a 56 y.o. male  was evaluated in triage.  Pt complains of finger and feet being numb.  Pt concerned that he is allergic to insulin.  Pt reports his stool is alos darker than usual   Review of Systems  Positive: numbness Negative: fever  Physical Exam  BP (!) 150/96 (BP Location: Left Arm)   Pulse 97   Temp 99.1 F (37.3 C) (Oral)   Resp 18   Ht 5\' 7"  (1.702 m)   Wt 68 kg   SpO2 100%   BMI 23.49 kg/m  Gen:   Awake, no distress   Resp:  Normal effort  MSK:   Moves extremities without difficulty  Other:    Medical Decision Making  Medically screening exam initiated at 5:54 PM.  Appropriate orders placed.  Nicolis Sangalang was informed that the remainder of the evaluation will be completed by another provider, this initial triage assessment does not replace that evaluation, and the importance of remaining in the ED until their evaluation is complete.     Zenda Alpers, Elson Areas 10/10/21 1755

## 2021-10-14 ENCOUNTER — Encounter (HOSPITAL_COMMUNITY): Payer: Self-pay | Admitting: Emergency Medicine

## 2021-10-14 ENCOUNTER — Emergency Department (HOSPITAL_COMMUNITY)
Admission: EM | Admit: 2021-10-14 | Discharge: 2021-10-14 | Disposition: A | Discharge status: Home / Self Care | Payer: Commercial Managed Care - HMO | Attending: Emergency Medicine | Admitting: Emergency Medicine

## 2021-10-14 DIAGNOSIS — Z5321 Procedure and treatment not carried out due to patient leaving prior to being seen by health care provider: Secondary | ICD-10-CM | POA: Insufficient documentation

## 2021-10-14 DIAGNOSIS — R109 Unspecified abdominal pain: Secondary | ICD-10-CM | POA: Diagnosis present

## 2021-10-14 DIAGNOSIS — R197 Diarrhea, unspecified: Secondary | ICD-10-CM | POA: Diagnosis not present

## 2021-10-14 DIAGNOSIS — R110 Nausea: Secondary | ICD-10-CM | POA: Insufficient documentation

## 2021-10-14 LAB — COMPREHENSIVE METABOLIC PANEL
ALT: 22 U/L (ref 0–44)
AST: 24 U/L (ref 15–41)
Albumin: 4.9 g/dL (ref 3.5–5.0)
Alkaline Phosphatase: 59 U/L (ref 38–126)
Anion gap: 10 (ref 5–15)
BUN: 19 mg/dL (ref 6–20)
CO2: 22 mmol/L (ref 22–32)
Calcium: 10.3 mg/dL (ref 8.9–10.3)
Chloride: 107 mmol/L (ref 98–111)
Creatinine, Ser: 1.3 mg/dL — ABNORMAL HIGH (ref 0.61–1.24)
GFR, Estimated: >60 mL/min (ref >60)
Glucose, Bld: 99 mg/dL (ref 70–99)
Potassium: 4.6 mmol/L (ref 3.5–5.1)
Sodium: 139 mmol/L (ref 135–145)
Total Bilirubin: 0.7 mg/dL (ref 0.3–1.2)
Total Protein: 8.5 g/dL — ABNORMAL HIGH (ref 6.5–8.1)

## 2021-10-14 LAB — CBC
HCT: 37.5 % — ABNORMAL LOW (ref 39.0–52.0)
Hemoglobin: 12 g/dL — ABNORMAL LOW (ref 13.0–17.0)
MCH: 31.4 pg (ref 26.0–34.0)
MCHC: 32 g/dL (ref 30.0–36.0)
MCV: 98.2 fL (ref 80.0–100.0)
Platelets: 298 10*3/uL (ref 150–400)
RBC: 3.82 MIL/uL — ABNORMAL LOW (ref 4.22–5.81)
RDW: 14.3 % (ref 11.5–15.5)
WBC: 5 10*3/uL (ref 4.0–10.5)
nRBC: 0 % (ref 0.0–0.2)

## 2021-10-14 LAB — LIPASE, BLOOD: Lipase: 37 U/L (ref 11–51)

## 2021-10-14 NOTE — ED Triage Notes (Signed)
Patient c/o abdominal pain with diarrhea since yesterday. Reports nausea, denies vomiting. Also requesting work note from visit on 9/7.

## 2021-12-09 ENCOUNTER — Encounter (HOSPITAL_COMMUNITY): Payer: Self-pay

## 2021-12-09 ENCOUNTER — Emergency Department (HOSPITAL_COMMUNITY): Payer: Commercial Managed Care - HMO

## 2021-12-09 ENCOUNTER — Inpatient Hospital Stay (HOSPITAL_COMMUNITY)
Admission: EM | Admit: 2021-12-09 | Discharge: 2021-12-13 | DRG: 638 | Disposition: A | Discharge status: Home / Self Care | Payer: Commercial Managed Care - HMO | Attending: Internal Medicine | Admitting: Internal Medicine

## 2021-12-09 ENCOUNTER — Other Ambulatory Visit: Payer: Self-pay

## 2021-12-09 DIAGNOSIS — I129 Hypertensive chronic kidney disease with stage 1 through stage 4 chronic kidney disease, or unspecified chronic kidney disease: Secondary | ICD-10-CM | POA: Diagnosis present

## 2021-12-09 DIAGNOSIS — E785 Hyperlipidemia, unspecified: Secondary | ICD-10-CM | POA: Diagnosis present

## 2021-12-09 DIAGNOSIS — R1013 Epigastric pain: Secondary | ICD-10-CM | POA: Diagnosis present

## 2021-12-09 DIAGNOSIS — E1110 Type 2 diabetes mellitus with ketoacidosis without coma: Principal | ICD-10-CM | POA: Diagnosis present

## 2021-12-09 DIAGNOSIS — E1169 Type 2 diabetes mellitus with other specified complication: Secondary | ICD-10-CM | POA: Diagnosis present

## 2021-12-09 DIAGNOSIS — E876 Hypokalemia: Secondary | ICD-10-CM | POA: Diagnosis present

## 2021-12-09 DIAGNOSIS — H409 Unspecified glaucoma: Secondary | ICD-10-CM | POA: Diagnosis present

## 2021-12-09 DIAGNOSIS — K209 Esophagitis, unspecified without bleeding: Secondary | ICD-10-CM | POA: Diagnosis present

## 2021-12-09 DIAGNOSIS — Z91014 Allergy to mammalian meats: Secondary | ICD-10-CM | POA: Diagnosis not present

## 2021-12-09 DIAGNOSIS — Z7984 Long term (current) use of oral hypoglycemic drugs: Secondary | ICD-10-CM | POA: Diagnosis not present

## 2021-12-09 DIAGNOSIS — Z8249 Family history of ischemic heart disease and other diseases of the circulatory system: Secondary | ICD-10-CM

## 2021-12-09 DIAGNOSIS — E871 Hypo-osmolality and hyponatremia: Secondary | ICD-10-CM | POA: Diagnosis present

## 2021-12-09 DIAGNOSIS — F1010 Alcohol abuse, uncomplicated: Secondary | ICD-10-CM | POA: Diagnosis present

## 2021-12-09 DIAGNOSIS — E11649 Type 2 diabetes mellitus with hypoglycemia without coma: Secondary | ICD-10-CM | POA: Diagnosis not present

## 2021-12-09 DIAGNOSIS — E1165 Type 2 diabetes mellitus with hyperglycemia: Secondary | ICD-10-CM

## 2021-12-09 DIAGNOSIS — R066 Hiccough: Secondary | ICD-10-CM | POA: Diagnosis present

## 2021-12-09 DIAGNOSIS — Z886 Allergy status to analgesic agent status: Secondary | ICD-10-CM

## 2021-12-09 DIAGNOSIS — R112 Nausea with vomiting, unspecified: Secondary | ICD-10-CM | POA: Diagnosis not present

## 2021-12-09 DIAGNOSIS — R739 Hyperglycemia, unspecified: Secondary | ICD-10-CM

## 2021-12-09 DIAGNOSIS — E8889 Other specified metabolic disorders: Secondary | ICD-10-CM

## 2021-12-09 DIAGNOSIS — I700 Atherosclerosis of aorta: Secondary | ICD-10-CM | POA: Diagnosis present

## 2021-12-09 DIAGNOSIS — N182 Chronic kidney disease, stage 2 (mild): Secondary | ICD-10-CM | POA: Diagnosis present

## 2021-12-09 DIAGNOSIS — F1210 Cannabis abuse, uncomplicated: Secondary | ICD-10-CM | POA: Diagnosis present

## 2021-12-09 DIAGNOSIS — E1122 Type 2 diabetes mellitus with diabetic chronic kidney disease: Secondary | ICD-10-CM | POA: Diagnosis present

## 2021-12-09 DIAGNOSIS — Z794 Long term (current) use of insulin: Secondary | ICD-10-CM | POA: Diagnosis not present

## 2021-12-09 DIAGNOSIS — E1159 Type 2 diabetes mellitus with other circulatory complications: Secondary | ICD-10-CM | POA: Diagnosis present

## 2021-12-09 DIAGNOSIS — Z79899 Other long term (current) drug therapy: Secondary | ICD-10-CM | POA: Diagnosis not present

## 2021-12-09 DIAGNOSIS — I10 Essential (primary) hypertension: Secondary | ICD-10-CM | POA: Diagnosis not present

## 2021-12-09 DIAGNOSIS — N179 Acute kidney failure, unspecified: Secondary | ICD-10-CM | POA: Diagnosis present

## 2021-12-09 LAB — BASIC METABOLIC PANEL
Anion gap: 12 (ref 5–15)
BUN: 47 mg/dL — ABNORMAL HIGH (ref 6–20)
CO2: 25 mmol/L (ref 22–32)
Calcium: 9.1 mg/dL (ref 8.9–10.3)
Chloride: 97 mmol/L — ABNORMAL LOW (ref 98–111)
Creatinine, Ser: 3 mg/dL — ABNORMAL HIGH (ref 0.61–1.24)
GFR, Estimated: 24 mL/min — ABNORMAL LOW (ref >60)
Glucose, Bld: 350 mg/dL — ABNORMAL HIGH (ref 70–99)
Potassium: 4.1 mmol/L (ref 3.5–5.1)
Sodium: 134 mmol/L — ABNORMAL LOW (ref 135–145)

## 2021-12-09 LAB — BLOOD GAS, VENOUS
Acid-Base Excess: 3.8 mmol/L — ABNORMAL HIGH (ref 0.0–2.0)
Bicarbonate: 25.1 mmol/L (ref 20.0–28.0)
O2 Saturation: 20 %
Patient temperature: 37
pCO2, Ven: 28 mmHg — ABNORMAL LOW (ref 44–60)
pH, Ven: 7.56 — ABNORMAL HIGH (ref 7.25–7.43)
pO2, Ven: <31 mmHg — CL (ref 32–45)

## 2021-12-09 LAB — URINALYSIS, ROUTINE W REFLEX MICROSCOPIC
Bilirubin Urine: NEGATIVE
Glucose, UA: >=500 mg/dL — AB
Ketones, ur: NEGATIVE mg/dL
Leukocytes,Ua: NEGATIVE
Nitrite: NEGATIVE
Protein, ur: >=300 mg/dL — AB
Specific Gravity, Urine: 1.021 (ref 1.005–1.030)
pH: 5 (ref 5.0–8.0)

## 2021-12-09 LAB — BETA-HYDROXYBUTYRIC ACID
Beta-Hydroxybutyric Acid: 0.72 mmol/L — ABNORMAL HIGH (ref 0.05–0.27)
Beta-Hydroxybutyric Acid: 0.25 mmol/L (ref 0.05–0.27)

## 2021-12-09 LAB — CBG MONITORING, ED
Glucose-Capillary: 408 mg/dL — ABNORMAL HIGH (ref 70–99)
Glucose-Capillary: 300 mg/dL — ABNORMAL HIGH (ref 70–99)
Glucose-Capillary: 327 mg/dL — ABNORMAL HIGH (ref 70–99)
Glucose-Capillary: 51 mg/dL — ABNORMAL LOW (ref 70–99)
Glucose-Capillary: 64 mg/dL — ABNORMAL LOW (ref 70–99)
Glucose-Capillary: 79 mg/dL (ref 70–99)

## 2021-12-09 LAB — COMPREHENSIVE METABOLIC PANEL
ALT: 31 U/L (ref 0–44)
AST: 26 U/L (ref 15–41)
Albumin: 4.9 g/dL (ref 3.5–5.0)
Alkaline Phosphatase: 76 U/L (ref 38–126)
Anion gap: 17 — ABNORMAL HIGH (ref 5–15)
BUN: 44 mg/dL — ABNORMAL HIGH (ref 6–20)
CO2: 21 mmol/L — ABNORMAL LOW (ref 22–32)
Calcium: 9.5 mg/dL (ref 8.9–10.3)
Chloride: 95 mmol/L — ABNORMAL LOW (ref 98–111)
Creatinine, Ser: 3.19 mg/dL — ABNORMAL HIGH (ref 0.61–1.24)
GFR, Estimated: 22 mL/min — ABNORMAL LOW (ref >60)
Glucose, Bld: 398 mg/dL — ABNORMAL HIGH (ref 70–99)
Potassium: 3.6 mmol/L (ref 3.5–5.1)
Sodium: 133 mmol/L — ABNORMAL LOW (ref 135–145)
Total Bilirubin: 1.2 mg/dL (ref 0.3–1.2)
Total Protein: 8.4 g/dL — ABNORMAL HIGH (ref 6.5–8.1)

## 2021-12-09 LAB — CBC WITH DIFFERENTIAL/PLATELET
Abs Immature Granulocytes: 0.03 10*3/uL (ref 0.00–0.07)
Basophils Absolute: 0 10*3/uL (ref 0.0–0.1)
Basophils Relative: 0 %
Eosinophils Absolute: 0 10*3/uL (ref 0.0–0.5)
Eosinophils Relative: 0 %
HCT: 41 % (ref 39.0–52.0)
Hemoglobin: 14.3 g/dL (ref 13.0–17.0)
Immature Granulocytes: 0 %
Lymphocytes Relative: 13 %
Lymphs Abs: 1.3 10*3/uL (ref 0.7–4.0)
MCH: 32.1 pg (ref 26.0–34.0)
MCHC: 34.9 g/dL (ref 30.0–36.0)
MCV: 91.9 fL (ref 80.0–100.0)
Monocytes Absolute: 0.8 10*3/uL (ref 0.1–1.0)
Monocytes Relative: 8 %
Neutro Abs: 8 10*3/uL — ABNORMAL HIGH (ref 1.7–7.7)
Neutrophils Relative %: 79 %
Platelets: 319 10*3/uL (ref 150–400)
RBC: 4.46 MIL/uL (ref 4.22–5.81)
RDW: 12.5 % (ref 11.5–15.5)
WBC: 10.2 10*3/uL (ref 4.0–10.5)
nRBC: 0 % (ref 0.0–0.2)

## 2021-12-09 LAB — LIPASE, BLOOD: Lipase: 44 U/L (ref 11–51)

## 2021-12-09 MED ORDER — INSULIN REGULAR(HUMAN) IN NACL 100-0.9 UT/100ML-% IV SOLN
INTRAVENOUS | Status: DC
  Administered 2021-12-09: 8.5 [IU]/h via INTRAVENOUS
  Filled 2021-12-09: qty 100

## 2021-12-09 MED ORDER — LACTATED RINGERS IV SOLN: INTRAVENOUS | Status: DC

## 2021-12-09 MED ORDER — ONDANSETRON HCL 4 MG/2ML IJ SOLN
4.0000 mg | Freq: Four times a day (QID) | INTRAMUSCULAR | Status: DC | PRN
  Administered 2021-12-10 – 2021-12-11 (×3): 4 mg via INTRAVENOUS
  Filled 2021-12-09 (×3): qty 2

## 2021-12-09 MED ORDER — THIAMINE HCL 100 MG/ML IJ SOLN
100.0000 mg | Freq: Every day | INTRAMUSCULAR | Status: DC
  Administered 2021-12-11: 100 mg via INTRAVENOUS
  Filled 2021-12-09 (×3): qty 2

## 2021-12-09 MED ORDER — DEXTROSE 50 % IV SOLN
0.0000 mL | INTRAVENOUS | Status: DC | PRN
  Administered 2021-12-09: 50 mL via INTRAVENOUS
  Filled 2021-12-09: qty 50

## 2021-12-09 MED ORDER — FOLIC ACID 1 MG PO TABS
1.0000 mg | ORAL_TABLET | Freq: Every day | ORAL | Status: DC
  Administered 2021-12-09 – 2021-12-13 (×5): 1 mg via ORAL
  Filled 2021-12-09 (×5): qty 1

## 2021-12-09 MED ORDER — POTASSIUM CHLORIDE 10 MEQ/100ML IV SOLN
10.0000 meq | INTRAVENOUS | Status: AC
  Administered 2021-12-09: 10 meq via INTRAVENOUS
  Filled 2021-12-09 (×3): qty 100

## 2021-12-09 MED ORDER — LORAZEPAM 2 MG/ML IJ SOLN
0.0000 mg | Freq: Three times a day (TID) | INTRAMUSCULAR | Status: DC
  Administered 2021-12-11: 2 mg via INTRAVENOUS
  Administered 2021-12-12: 1 mg via INTRAVENOUS
  Filled 2021-12-09 (×2): qty 1

## 2021-12-09 MED ORDER — LORAZEPAM 1 MG PO TABS: 1.0000 mg | ORAL_TABLET | ORAL | Status: AC | PRN

## 2021-12-09 MED ORDER — ZOLPIDEM TARTRATE 5 MG PO TABS
5.0000 mg | ORAL_TABLET | Freq: Every evening | ORAL | Status: DC | PRN
  Administered 2021-12-10 – 2021-12-12 (×3): 5 mg via ORAL
  Filled 2021-12-09 (×3): qty 1

## 2021-12-09 MED ORDER — LORAZEPAM 2 MG/ML IJ SOLN
1.0000 mg | INTRAMUSCULAR | Status: AC | PRN
  Administered 2021-12-10: 1 mg via INTRAVENOUS
  Administered 2021-12-11: 2 mg via INTRAVENOUS
  Filled 2021-12-09 (×2): qty 1

## 2021-12-09 MED ORDER — LORAZEPAM 2 MG/ML IJ SOLN
0.0000 mg | INTRAMUSCULAR | Status: AC
  Administered 2021-12-09 – 2021-12-11 (×3): 2 mg via INTRAVENOUS
  Filled 2021-12-09 (×4): qty 1

## 2021-12-09 MED ORDER — ADULT MULTIVITAMIN W/MINERALS CH
1.0000 | ORAL_TABLET | Freq: Every day | ORAL | Status: DC
  Administered 2021-12-09 – 2021-12-13 (×5): 1 via ORAL
  Filled 2021-12-09 (×5): qty 1

## 2021-12-09 MED ORDER — ONDANSETRON HCL 4 MG/2ML IJ SOLN
4.0000 mg | Freq: Once | INTRAMUSCULAR | Status: AC
  Administered 2021-12-09: 4 mg via INTRAVENOUS
  Filled 2021-12-09: qty 2

## 2021-12-09 MED ORDER — DEXTROSE 50 % IV SOLN: 0.0000 mL | INTRAVENOUS | Status: DC | PRN

## 2021-12-09 MED ORDER — DEXTROSE IN LACTATED RINGERS 5 % IV SOLN: INTRAVENOUS | Status: DC

## 2021-12-09 MED ORDER — HYDROMORPHONE HCL 1 MG/ML IJ SOLN
1.0000 mg | Freq: Once | INTRAMUSCULAR | Status: AC
  Administered 2021-12-09: 1 mg via INTRAVENOUS
  Filled 2021-12-09: qty 1

## 2021-12-09 MED ORDER — INSULIN REGULAR(HUMAN) IN NACL 100-0.9 UT/100ML-% IV SOLN
INTRAVENOUS | Status: DC
  Filled 2021-12-09: qty 100

## 2021-12-09 MED ORDER — PANTOPRAZOLE SODIUM 40 MG IV SOLR
40.0000 mg | Freq: Once | INTRAVENOUS | Status: AC
  Administered 2021-12-09: 40 mg via INTRAVENOUS
  Filled 2021-12-09: qty 10

## 2021-12-09 MED ORDER — SUCRALFATE 1 GM/10ML PO SUSP
1.0000 g | Freq: Three times a day (TID) | ORAL | Status: DC
  Administered 2021-12-09 – 2021-12-13 (×12): 1 g via ORAL
  Filled 2021-12-09 (×14): qty 10

## 2021-12-09 MED ORDER — DICYCLOMINE HCL 20 MG PO TABS
20.0000 mg | ORAL_TABLET | Freq: Two times a day (BID) | ORAL | Status: DC
  Administered 2021-12-09 – 2021-12-13 (×8): 20 mg via ORAL
  Filled 2021-12-09 (×8): qty 1

## 2021-12-09 MED ORDER — THIAMINE MONONITRATE 100 MG PO TABS
100.0000 mg | ORAL_TABLET | Freq: Every day | ORAL | Status: DC
  Administered 2021-12-09 – 2021-12-13 (×4): 100 mg via ORAL
  Filled 2021-12-09 (×4): qty 1

## 2021-12-09 MED ORDER — PRAVASTATIN SODIUM 20 MG PO TABS
10.0000 mg | ORAL_TABLET | Freq: Every day | ORAL | Status: DC
  Administered 2021-12-09 – 2021-12-13 (×5): 10 mg via ORAL
  Filled 2021-12-09: qty 1
  Filled 2021-12-09: qty 0.5
  Filled 2021-12-09 (×3): qty 1

## 2021-12-09 MED ORDER — LACTATED RINGERS IV BOLUS
20.0000 mL/kg | Freq: Once | INTRAVENOUS | Status: AC
  Administered 2021-12-09: 1360 mL via INTRAVENOUS

## 2021-12-09 MED ORDER — LACTATED RINGERS IV SOLN
INTRAVENOUS | Status: DC
  Administered 2021-12-09: 999 mL via INTRAVENOUS

## 2021-12-09 MED ORDER — PANTOPRAZOLE SODIUM 40 MG PO TBEC
40.0000 mg | DELAYED_RELEASE_TABLET | Freq: Two times a day (BID) | ORAL | Status: DC
  Administered 2021-12-10 – 2021-12-13 (×6): 40 mg via ORAL
  Filled 2021-12-09 (×7): qty 1

## 2021-12-09 NOTE — ED Notes (Signed)
Pt last cbg reading 51. Insulin d/c'd, Pt was given orange juice by NT and 1 amp of dextrose by nurse. Provider notified. Will continue to monitor

## 2021-12-09 NOTE — ED Triage Notes (Signed)
Pt reports upper abd pain described as burning with n/v x2 days. Pt used ETOH yesterday and pain worse after.  Hx IDDM with no meds including insulin in few days. Cbg with ems 309

## 2021-12-09 NOTE — ED Provider Triage Note (Signed)
Emergency Medicine Provider Triage Evaluation Note  James Bradford , a 56 y.o. male  was evaluated in triage.  Pt complains of upper abdominal pain, nausea and vomiting.  Having epigastric pain for the last 3 days.  Also drinking a lot of alcohol as an attempt to help but it made it worse.  He is not taking his insulin because he is not eating and drinking.  No previous abdominal surgeries..  Review of Systems  Per hPI  Physical Exam  BP (!) 160/106 (BP Location: Right Arm)   Pulse 84   Temp 98.4 F (36.9 C) (Oral)   Resp 18   Ht 5\' 7"  (1.702 m)   Wt 68 kg   SpO2 100%   BMI 23.48 kg/m  Gen:   Awake, no distress   Resp:  Normal effort  MSK:   Moves extremities without difficulty  Other:  Epigastric tenderness, voluntary guarding  Medical Decision Making  Medically screening exam initiated at 12:28 PM.  Appropriate orders placed.  James Bradford was informed that the remainder of the evaluation will be completed by another provider, this initial triage assessment does not replace that evaluation, and the importance of remaining in the ED until their evaluation is complete.     Sherrill Raring, PA-C 12/09/21 1230

## 2021-12-09 NOTE — ED Provider Notes (Signed)
I provided a substantive portion of the care of this patient.  I personally performed the entirety of the medical decision making for this encounter.  EKG Interpretation  Date/Time:  Monday December 09 2021 12:41:33 EST Ventricular Rate:  73 PR Interval:  132 QRS Duration: 90 QT Interval:  396 QTC Calculation: 437 R Axis:   62 Text Interpretation: Sinus rhythm Right atrial enlargement Borderline T wave abnormalities Confirmed by Lacretia Leigh ([id number]) on 12/09/2021 2:22:45 PM   56 year old male presents for upper abdominal pain nausea vomiting.  Has evidence of acute kidney injury here.  Also has mild hyperglycemia without severe DKA.  Given IV fluids here and will require admission   Lacretia Leigh, MD 12/09/21 1527

## 2021-12-09 NOTE — H&P (Signed)
History and Physical  Patient: Jakolby Sedivy IZT:[id number] DOB: 09-Jul-1965 DOA: 12/09/2021 DOS: the patient was seen and examined on 12/09/2021 Patient coming from: Home  Chief Complaint:  Chief Complaint  Patient presents with   Abdominal Pain   HPI: Misha Antonini is a 56 y.o. male with PMH significant of HLD, HTN, marijuana use, alcohol use, type II DM, CKD 2.  Patient presented to the hospital with complaints of abdominal pain along with nausea and vomiting. Patient reports he is not a daily drinker of alcohol but ' parties occasionally'.  He drank hard liquor on Saturday.  When he woke up on Sunday he started having episodes of nausea and vomiting.  He reports vomiting every 5 minutes this was followed by dry heaving only.  He reports diffuse abdominal pain which is crampy in nature associated with bowel movement and nausea. He did not use his insulin on Saturday and Sunday. He also reports that he had 5-6 jellylike bowel movement without any blood. Denies any fever or chills.  Denies any chest pain.  Denies any cough. Denies any recent hospitalization. He does have his medications at home.  Review of Systems: As mentioned in the history of present illness. All other systems reviewed and are negative. Past Medical History:  Diagnosis Date   Aortic atherosclerosis ([place]) 08/24/2020   Hyperlipidemia    Hypertension    Marijuana abuse 09/28/2020   Sinus bradycardia    Stage 3 chronic kidney disease ([place])    Type 2 diabetes mellitus ([place])    Unspecified glaucoma 02/18/2008   Qualifier: Diagnosis of  By: Hassell Done FNP, Tori Milks     Past Surgical History:  Procedure Laterality Date   HAND SURGERY     LEG SURGERY     Social History:  reports that he has never smoked. He has never used smokeless tobacco. He reports current drug use. Drug: Marijuana. He reports that he does not drink alcohol. Allergies  Allergen Reactions   Ibuprofen Other (See Comments)    Hurts his stomach     Pork-Derived Products     Patient does not eat pork products 2/2 to religious beliefs. Wants to avoid all pork containing medications   Aspirin Other (See Comments)    Stomach irritation   Family History  Problem Relation Age of Onset   Hypertension Mother    Pancreatic cancer Father    Prior to Admission medications   Medication Sig Start Date End Date Taking? Authorizing Provider  dicyclomine (BENTYL) 20 MG tablet Take 1 tablet (20 mg total) by mouth 2 (two) times daily. 12/25/19  Yes Faustino Congress, NP  glipiZIDE (GLUCOTROL) 10 MG tablet Take 10 mg by mouth 2 (two) times daily before a meal. 10/24/21  Yes [provider]  Insulin Glargine (BASAGLAR KWIKPEN) 100 UNIT/ML Inject 10 Units into the skin daily. 09/10/21  Yes Lama, Marge Duncans, MD  KRILL OIL PO Take 1 capsule by mouth daily.   Yes [provider]  lisinopril (ZESTRIL) 10 MG tablet Take 1 tablet (10 mg total) by mouth daily. 04/24/19  Yes Cresenzo, Angelyn Punt, MD  metFORMIN (GLUCOPHAGE) 500 MG tablet Take 1 tablet (500 mg total) by mouth 2 (two) times daily with a meal. 09/10/21 01/08/22 Yes Darrick Meigs, Marge Duncans, MD  pravastatin (PRAVACHOL) 10 MG tablet Take 1 tablet (10 mg total) by mouth daily. 04/24/19  Yes Cresenzo, Angelyn Punt, MD  sildenafil (VIAGRA) 100 MG tablet Take 100 mg by mouth daily as needed for erectile dysfunction. 12/21/19  Yes  [provider]  Accu-Chek Softclix Lancets lancets Use as instructed 04/24/19   Cresenzo, Angelyn Punt, MD  BLACK CURRANT SEED OIL PO Take 5 mLs by mouth daily. Patient not taking: Reported on 12/09/2021    [provider]  blood glucose meter kit and supplies KIT Dispense based on patient and insurance preference. Use up to four times daily as directed. 09/10/21   Oswald Hillock, MD  glucose blood (ACCU-CHEK GUIDE) test strip Use as instructed 04/24/19   Concepcion Living, MD  Insulin Pen Needle 32G X 4 MM MISC Use with Insulin pen 09/10/21   Oswald Hillock, MD  Multiple Vitamin  (MULTIVITAMIN ADULT PO) Take 1 tablet by mouth daily. Patient not taking: Reported on 12/09/2021    [provider]  ondansetron (ZOFRAN-ODT) 8 MG disintegrating tablet 73m ODT q4 hours prn nausea Patient not taking: Reported on 12/09/2021 10/03/21   DVeryl Speak MD   Physical Exam: Vitals:   12/09/21 1215 12/09/21 1226 12/09/21 1630  BP: (!) 160/106  112/78  Pulse: 84  89  Resp: 18  18  Temp: 98.4 F (36.9 C)  98.5 F (36.9 C)  TempSrc: Oral    SpO2: 100%  100%  Weight:  68 kg   Height:  _0  (1.702 m)    General: Appear in mild distress; no visible Abnormal Neck Mass Or lumps, Conjunctiva normal Cardiovascular: S1 and S2 Present, no Murmur, Respiratory: good respiratory effort, Bilateral Air entry present and CTA, no Crackles, no wheezes Abdomen: Bowel Sound present, diffusely tender Extremities: no Pedal edema Neurology: alert and oriented to time, place, and person Gait not checked due to patient safety concerns   Data Reviewed: Since last encounter, pertinent lab results CBC, CMP, beta-hydroxybutyrate acid level   . I have ordered test including CBC, CMP, beta-hydroxybutyrate acid level  . I have discussed pt's care plan and test results with ED provider  .   Assessment and Plan Principal Problem:   DKA (diabetic ketoacidosis) ([place] gap 18. Beta-hydroxybutyrate Mildly Elevated. Presents with Nausea Vomiting. Has Hyperglycemia. Did Not Use His Insulin for Last 3 Days. Currently on IV Insulin Therapy. Currently on IV Fluids. IV Antibiotics. Monitor.  Intractable nausea and vomiting. In the setting of DKA and alcohol use. As needed Zofran. Monitor. Also has history of cannabis abuse,?  Cyclical vomiting syndrome.  Esophagitis. Seen on the CT scan. We will treat with PPI twice daily and Carafate.  Type 2 diabetes mellitus, uncontrolled with hyperglycemia with chronic kidney disease with long-term insulin use. Hold metformin and  glipizide. Currently on IV insulin. Hemoglobin A1c 13.5 range August 2023. We will recheck.  Acute kidney injury on CKD stage II. Baseline serum creatinine around 1.2. On admission serum creatinine 3.19. Currently receiving aggressive IV hydration. We will monitor. Hyponatremia mostly pseudohyponatremia in the setting of hyperglycemia. Currently receiving potassium replacement for IV insulin therapy for potassium less than 4.  Advance Care Planning:   Code Status: Full Code  Consults: none Family Communication: no one at bedside  Author: PBerle Mull MD 12/09/2021 5:51 PM For on call review [email or web address]

## 2021-12-09 NOTE — ED Provider Notes (Signed)
[facility] DEPT Provider Note   CSN: [id number] Arrival date & time: 12/09/21  1210     History  Chief Complaint  Patient presents with   Abdominal Pain    James Bradford is a 56 y.o. male.   Abdominal Pain    Patient with medical history of alcohol abuse, Uncontrolled type 2 diabetes, marijuana abuse, HTN, HLD, presents today due to nausea and vomiting x2 days.  Associated with epigastric abdominal pain which does not radiate elsewhere.  He is unable to eat or drink anything without vomiting, he states he has been able to drink alcohol and has had large amounts which "did not help my pain".  He has not been taking any of his diabetic medicine secondary to not eating.  Denies any previous abdominal surgeries.  Home Medications Prior to Admission medications   Medication Sig Start Date End Date Taking? Authorizing Provider  dicyclomine (BENTYL) 20 MG tablet Take 1 tablet (20 mg total) by mouth 2 (two) times daily. 12/25/19  Yes Faustino Congress, NP  glipiZIDE (GLUCOTROL) 10 MG tablet Take 10 mg by mouth 2 (two) times daily before a meal. 10/24/21  Yes [provider]  KRILL OIL PO Take 1 capsule by mouth daily.   Yes [provider]  lisinopril (ZESTRIL) 10 MG tablet Take 1 tablet (10 mg total) by mouth daily. 04/24/19  Yes Cresenzo, Angelyn Punt, MD  metFORMIN (GLUCOPHAGE) 500 MG tablet Take 1 tablet (500 mg total) by mouth 2 (two) times daily with a meal. 09/10/21 01/08/22 Yes Darrick Meigs, Marge Duncans, MD  pravastatin (PRAVACHOL) 10 MG tablet Take 1 tablet (10 mg total) by mouth daily. 04/24/19  Yes Cresenzo, Angelyn Punt, MD  sildenafil (VIAGRA) 100 MG tablet Take 100 mg by mouth daily as needed for erectile dysfunction. 12/21/19  Yes [provider]  Accu-Chek Softclix Lancets lancets Use as instructed 04/24/19   Cresenzo, Angelyn Punt, MD  BLACK CURRANT SEED OIL PO Take 5 mLs by mouth daily. Patient not taking: Reported on 12/09/2021    [provider]  blood glucose meter kit and supplies KIT Dispense based on patient and insurance preference. Use up to four times daily as directed. 09/10/21   Oswald Hillock, MD  glucose blood (ACCU-CHEK GUIDE) test strip Use as instructed 04/24/19   Concepcion Living, MD  Insulin Glargine [facility]) 100 UNIT/ML Inject 10 Units into the skin daily. 09/10/21   Oswald Hillock, MD  Insulin Pen Needle 32G X 4 MM MISC Use with Insulin pen 09/10/21   Oswald Hillock, MD  Multiple Vitamin (MULTIVITAMIN ADULT PO) Take 1 tablet by mouth daily. Patient not taking: Reported on 12/09/2021    [provider]  ondansetron (ZOFRAN-ODT) 8 MG disintegrating tablet 34m ODT q4 hours prn nausea Patient not taking: Reported on 12/09/2021 10/03/21   DVeryl Speak MD      Allergies    Ibuprofen, Pork-derived products, and Aspirin    Review of Systems   Review of Systems  Gastrointestinal:  Positive for abdominal pain.    Physical Exam Updated Vital Signs BP (!) 160/106 (BP Location: Right Arm)   Pulse 84   Temp 98.4 F (36.9 C) (Oral)   Resp 18   Ht _0  (1.702 m)   Wt 68 kg   SpO2 100%   BMI 23.48 kg/m  Physical Exam Vitals and nursing note reviewed. Exam conducted with a chaperone present.  Constitutional:      Appearance: Normal appearance.  HENT:     Head: Normocephalic and atraumatic.  Eyes:     General: No scleral icterus.       Right eye: No discharge.        Left eye: No discharge.     Extraocular Movements: Extraocular movements intact.     Pupils: Pupils are equal, round, and reactive to light.  Cardiovascular:     Rate and Rhythm: Normal rate and regular rhythm.     Pulses: Normal pulses.     Heart sounds: Normal heart sounds. No murmur heard.    No friction rub. No gallop.  Pulmonary:     Effort: Pulmonary effort is normal. No respiratory distress.     Breath sounds: Normal breath sounds.  Abdominal:     General: Abdomen is flat. Bowel sounds are normal. There is no  distension.     Palpations: Abdomen is soft.     Tenderness: There is abdominal tenderness in the epigastric area. There is guarding.     Comments: Abdomen soft, epigastric tenderness with voluntary guarding.  Skin:    General: Skin is warm and dry.     Coloration: Skin is not jaundiced.  Neurological:     Mental Status: He is alert. Mental status is at baseline.     Coordination: Coordination normal.     ED Results / Procedures / Treatments   Labs (all labs ordered are listed, but only abnormal results are displayed) Labs Reviewed  CBC WITH DIFFERENTIAL/PLATELET - Abnormal; Notable for the following components:      Result Value   Neutro Abs 8.0 (*)    All other components within normal limits  COMPREHENSIVE METABOLIC PANEL - Abnormal; Notable for the following components:   Sodium 133 (*)    Chloride 95 (*)    CO2 21 (*)    Glucose, Bld 398 (*)    BUN 44 (*)    Creatinine, Ser 3.19 (*)    Total Protein 8.4 (*)    GFR, Estimated 22 (*)    Anion gap 17 (*)    All other components within normal limits  BETA-HYDROXYBUTYRIC ACID - Abnormal; Notable for the following components:   Beta-Hydroxybutyric Acid 0.72 (*)    All other components within normal limits  BLOOD GAS, VENOUS - Abnormal; Notable for the following components:   pH, Ven 7.56 (*)    pCO2, Ven 28 (*)    pO2, Ven <31 (*)    Acid-Base Excess 3.8 (*)    All other components within normal limits  CBG MONITORING, ED - Abnormal; Notable for the following components:   Glucose-Capillary 408 (*)    All other components within normal limits  LIPASE, BLOOD  URINALYSIS, ROUTINE W REFLEX MICROSCOPIC    EKG EKG Interpretation  Date/Time:  Monday December 09 2021 12:41:33 EST Ventricular Rate:  73 PR Interval:  132 QRS Duration: 90 QT Interval:  396 QTC Calculation: 437 R Axis:   62 Text Interpretation: Sinus rhythm Right atrial enlargement Borderline T wave abnormalities Confirmed by Lacretia Leigh ([id number]) on  12/09/2021 2:44:24 PM  Radiology CT ABDOMEN PELVIS WO CONTRAST  Result Date: 12/09/2021 CLINICAL DATA:  Upper abdominal pain. "Pancreatitis severe". Alcohol abuse. Uncontrolled type 2 diabetes. EXAM: CT ABDOMEN AND PELVIS WITHOUT CONTRAST TECHNIQUE: Multidetector CT imaging of the abdomen and pelvis was performed following the standard protocol without IV contrast. RADIATION DOSE REDUCTION: This exam was performed according to the departmental dose-optimization program which includes automated exposure control, adjustment of the mA and/or kV  according to patient size and/or use of iterative reconstruction technique. COMPARISON:  10/03/2021 FINDINGS: Lower chest: Clear lung bases. Normal heart size without pericardial or pleural effusion. Suspect distal esophageal wall thickening, including on 07/02. Hepatobiliary: Normal liver. Normal gallbladder, without biliary ductal dilatation. Pancreas: Normal, without mass or ductal dilatation. Spleen: Normal in size, without focal abnormality. Adrenals/Urinary Tract: Minimal left adrenal thickening. Normal right adrenal gland. Hypoattenuating bilateral renal lesions of maximally 2.0 cm are most likely cysts . In the absence of clinically indicated signs/symptoms require(s) no independent follow-up. No renal calculi or hydronephrosis. No hydroureter or ureteric calculi. No bladder calculi. Stomach/Bowel: Normal stomach, without wall thickening. Normal colon. Appendix not visualized. Normal caliber small bowel. Vascular/Lymphatic: Normal caliber of the aorta and branch vessels. No abdominopelvic adenopathy. Reproductive: Normal prostate. Other: No significant free fluid. Musculoskeletal: Mild disc bulge at the lumbosacral junction. IMPRESSION: 1. No urinary tract calculi or hydronephrosis. 2. No explanation for patient's symptoms, given limitations of stone study technique. 3. Distal esophageal wall thickening, suspicious for esophagitis. Electronically Signed   By: Abigail Miyamoto M.D.   On: 12/09/2021 14:53    Procedures Procedures    Medications Ordered in ED Medications  lactated ringers bolus 1,360 mL (has no administration in time range)  insulin regular, human (MYXREDLIN) 100 units/ 100 mL infusion (has no administration in time range)  lactated ringers infusion ( Intravenous New Bag/Given 12/09/21 1511)  dextrose 5 % in lactated ringers infusion (has no administration in time range)  dextrose 50 % solution 0-50 mL (has no administration in time range)  potassium chloride 10 mEq in 100 mL IVPB (10 mEq Intravenous New Bag/Given 12/09/21 1524)  LORazepam (ATIVAN) tablet 1-4 mg (has no administration in time range)    Or  LORazepam (ATIVAN) injection 1-4 mg (has no administration in time range)  thiamine (VITAMIN B1) tablet 100 mg (has no administration in time range)    Or  thiamine (VITAMIN B1) injection 100 mg (has no administration in time range)  folic acid (FOLVITE) tablet 1 mg (has no administration in time range)  multivitamin with minerals tablet 1 tablet (has no administration in time range)  LORazepam (ATIVAN) injection 0-4 mg (has no administration in time range)    Followed by  LORazepam (ATIVAN) injection 0-4 mg (has no administration in time range)  ondansetron (ZOFRAN) injection 4 mg (has no administration in time range)  pantoprazole (PROTONIX) injection 40 mg (has no administration in time range)  ondansetron (ZOFRAN) injection 4 mg (4 mg Intravenous Given 12/09/21 1504)  HYDROmorphone (DILAUDID) injection 1 mg (1 mg Intravenous Given 12/09/21 1503)    ED Course/ Medical Decision Making/ A&P                           Medical Decision Making Amount and/or Complexity of Data Reviewed Labs: ordered. Radiology: ordered.  Risk Prescription drug management. Decision regarding hospitalization.   Patient presents due to epigastric abdominal pain, nausea and vomiting.  Differential includes but not limited to AKI, DKA, electrolyte  abnormalities, hypoglycemia, dehydration, pancreatitis, cholecystitis.  I reviewed external medical records including previous lab values.  Patient's baseline creatinine is about 1.3.  EMS was independent historian.  I ordered, viewed and interpreted laboratory work-up. No leukocytosis or anemia Lipase within normal limits, not consistent with pancreatitis pH 7.56. CMP with normal K 3.6,, hyperglycemia 398, hyponatremia 133 worsening AKI 3.19 and anion gap of 17.  Beta hydroxybutyrate acid 0.72  EKG shows sinus  rhythm.  I ordered and viewed CT abdomen and pelvis without contrast given patient's renal impairment.  Negative for any acute process.  I agree with radiologist interpretation.  I ordered Dilaudid, fluids, Zofran.  Patient will need admission for new AKI.  He has an anion gap I suspect its more alcoholic ketosis rather than DKA given he is not acidotic.    I will consult the hospitalist service.  Discussed HPI, physical exam and plan of care for this patient with attending Lacretia Leigh. The attending physician evaluated this patient as part of a shared visit and agrees with plan of care.         Final Clinical Impression(s) / ED Diagnoses Final diagnoses:  AKI (acute kidney injury) ([place])  Alcoholic ketosis ([place])  Nausea and vomiting, unspecified vomiting type  Hyperglycemia    Rx / DC Orders ED Discharge Orders     None         Sherrill Raring, Hershal Coria 12/09/21 1605    Lacretia Leigh, MD 12/12/21 [id number]

## 2021-12-09 NOTE — ED Notes (Signed)
Pts cbg is 79. Pt reports nausea. Nurse, NT and secretary has been trying to get in touch with provider on call but have been unsuccessful with several attempts. Will continue to monitor

## 2021-12-10 DIAGNOSIS — E1110 Type 2 diabetes mellitus with ketoacidosis without coma: Secondary | ICD-10-CM | POA: Diagnosis not present

## 2021-12-10 LAB — CBC
HCT: 40.3 % (ref 39.0–52.0)
Hemoglobin: 13.4 g/dL (ref 13.0–17.0)
MCH: 31.8 pg (ref 26.0–34.0)
MCHC: 33.3 g/dL (ref 30.0–36.0)
MCV: 95.7 fL (ref 80.0–100.0)
Platelets: 269 10*3/uL (ref 150–400)
RBC: 4.21 MIL/uL — ABNORMAL LOW (ref 4.22–5.81)
RDW: 12.4 % (ref 11.5–15.5)
WBC: 12.2 10*3/uL — ABNORMAL HIGH (ref 4.0–10.5)
nRBC: 0 % (ref 0.0–0.2)

## 2021-12-10 LAB — CBG MONITORING, ED
Glucose-Capillary: 100 mg/dL — ABNORMAL HIGH (ref 70–99)
Glucose-Capillary: 111 mg/dL — ABNORMAL HIGH (ref 70–99)
Glucose-Capillary: 121 mg/dL — ABNORMAL HIGH (ref 70–99)
Glucose-Capillary: 129 mg/dL — ABNORMAL HIGH (ref 70–99)
Glucose-Capillary: 143 mg/dL — ABNORMAL HIGH (ref 70–99)
Glucose-Capillary: 188 mg/dL — ABNORMAL HIGH (ref 70–99)
Glucose-Capillary: 196 mg/dL — ABNORMAL HIGH (ref 70–99)
Glucose-Capillary: 228 mg/dL — ABNORMAL HIGH (ref 70–99)
Glucose-Capillary: 242 mg/dL — ABNORMAL HIGH (ref 70–99)
Glucose-Capillary: 284 mg/dL — ABNORMAL HIGH (ref 70–99)
Glucose-Capillary: 75 mg/dL (ref 70–99)

## 2021-12-10 LAB — BASIC METABOLIC PANEL
Anion gap: 8 (ref 5–15)
Anion gap: 16 — ABNORMAL HIGH (ref 5–15)
BUN: 37 mg/dL — ABNORMAL HIGH (ref 6–20)
BUN: 27 mg/dL — ABNORMAL HIGH (ref 6–20)
CO2: 24 mmol/L (ref 22–32)
CO2: 26 mmol/L (ref 22–32)
Calcium: 9.4 mg/dL (ref 8.9–10.3)
Calcium: 10 mg/dL (ref 8.9–10.3)
Chloride: 103 mmol/L (ref 98–111)
Chloride: 98 mmol/L (ref 98–111)
Creatinine, Ser: 2.03 mg/dL — ABNORMAL HIGH (ref 0.61–1.24)
Creatinine, Ser: 1.35 mg/dL — ABNORMAL HIGH (ref 0.61–1.24)
GFR, Estimated: 38 mL/min — ABNORMAL LOW (ref >60)
GFR, Estimated: >60 mL/min (ref >60)
Glucose, Bld: 180 mg/dL — ABNORMAL HIGH (ref 70–99)
Glucose, Bld: 73 mg/dL (ref 70–99)
Potassium: 3.5 mmol/L (ref 3.5–5.1)
Potassium: 3.6 mmol/L (ref 3.5–5.1)
Sodium: 135 mmol/L (ref 135–145)
Sodium: 140 mmol/L (ref 135–145)

## 2021-12-10 LAB — GLUCOSE, CAPILLARY
Glucose-Capillary: 127 mg/dL — ABNORMAL HIGH (ref 70–99)
Glucose-Capillary: 155 mg/dL — ABNORMAL HIGH (ref 70–99)
Glucose-Capillary: 67 mg/dL — ABNORMAL LOW (ref 70–99)
Glucose-Capillary: 85 mg/dL (ref 70–99)

## 2021-12-10 LAB — HEMOGLOBIN A1C
Hgb A1c MFr Bld: 7.8 % — ABNORMAL HIGH (ref 4.8–5.6)
Mean Plasma Glucose: 177.16 mg/dL

## 2021-12-10 LAB — MAGNESIUM: Magnesium: 2.5 mg/dL — ABNORMAL HIGH (ref 1.7–2.4)

## 2021-12-10 LAB — BETA-HYDROXYBUTYRIC ACID
Beta-Hydroxybutyric Acid: 0.31 mmol/L — ABNORMAL HIGH (ref 0.05–0.27)
Beta-Hydroxybutyric Acid: 0.72 mmol/L — ABNORMAL HIGH (ref 0.05–0.27)

## 2021-12-10 MED ORDER — POTASSIUM CHLORIDE 10 MEQ/100ML IV SOLN
INTRAVENOUS | Status: AC
  Filled 2021-12-10: qty 100

## 2021-12-10 MED ORDER — HYDRALAZINE HCL 25 MG PO TABS
25.0000 mg | ORAL_TABLET | Freq: Three times a day (TID) | ORAL | Status: DC
  Administered 2021-12-10 – 2021-12-13 (×10): 25 mg via ORAL
  Filled 2021-12-10 (×10): qty 1

## 2021-12-10 MED ORDER — INSULIN ASPART 100 UNIT/ML IJ SOLN
0.0000 [IU] | INTRAMUSCULAR | Status: DC
  Administered 2021-12-10: 2 [IU] via SUBCUTANEOUS
  Administered 2021-12-10 (×2): 1 [IU] via SUBCUTANEOUS
  Administered 2021-12-11 (×2): 2 [IU] via SUBCUTANEOUS
  Administered 2021-12-11: 1 [IU] via SUBCUTANEOUS
  Administered 2021-12-11 (×2): 2 [IU] via SUBCUTANEOUS
  Administered 2021-12-12: 3 [IU] via SUBCUTANEOUS
  Administered 2021-12-12 (×2): 1 [IU] via SUBCUTANEOUS
  Administered 2021-12-12: 3 [IU] via SUBCUTANEOUS
  Filled 2021-12-10: qty 0.09

## 2021-12-10 MED ORDER — LACTATED RINGERS IV SOLN: INTRAVENOUS | Status: DC

## 2021-12-10 MED ORDER — INSULIN GLARGINE-YFGN 100 UNIT/ML ~~LOC~~ SOLN
10.0000 [IU] | SUBCUTANEOUS | Status: DC
  Administered 2021-12-10: 10 [IU] via SUBCUTANEOUS
  Filled 2021-12-10: qty 0.1

## 2021-12-10 MED ORDER — INSULIN ASPART 100 UNIT/ML IJ SOLN
0.0000 [IU] | Freq: Three times a day (TID) | INTRAMUSCULAR | Status: DC
  Filled 2021-12-10: qty 0.15

## 2021-12-10 MED ORDER — METOCLOPRAMIDE HCL 5 MG/ML IJ SOLN
10.0000 mg | Freq: Four times a day (QID) | INTRAMUSCULAR | Status: DC
  Administered 2021-12-10 – 2021-12-13 (×12): 10 mg via INTRAVENOUS
  Filled 2021-12-10 (×12): qty 2

## 2021-12-10 MED ORDER — SODIUM CHLORIDE 0.9 % IV SOLN
12.5000 mg | Freq: Once | INTRAVENOUS | Status: AC
  Administered 2021-12-10: 12.5 mg via INTRAVENOUS
  Filled 2021-12-10: qty 0.5

## 2021-12-10 MED ORDER — INSULIN ASPART 100 UNIT/ML IJ SOLN
2.0000 [IU] | Freq: Three times a day (TID) | INTRAMUSCULAR | Status: DC
  Filled 2021-12-10: qty 0.02

## 2021-12-10 MED ORDER — HYDRALAZINE HCL 20 MG/ML IJ SOLN
10.0000 mg | INTRAMUSCULAR | Status: DC | PRN
  Administered 2021-12-11: 10 mg via INTRAVENOUS
  Filled 2021-12-10: qty 1

## 2021-12-10 MED ORDER — INSULIN ASPART 100 UNIT/ML IJ SOLN
0.0000 [IU] | Freq: Every day | INTRAMUSCULAR | Status: DC
  Filled 2021-12-10: qty 0.05

## 2021-12-10 MED ORDER — INSULIN GLARGINE-YFGN 100 UNIT/ML ~~LOC~~ SOLN
5.0000 [IU] | SUBCUTANEOUS | Status: DC
  Administered 2021-12-11 – 2021-12-13 (×3): 5 [IU] via SUBCUTANEOUS
  Filled 2021-12-10 (×3): qty 0.05

## 2021-12-10 NOTE — Inpatient Diabetes Management (Signed)
Inpatient Diabetes Program Recommendations  AACE/ADA: New Consensus Statement on Inpatient Glycemic Control (2015)  Target Ranges:  Prepandial:   less than 140 mg/dL      Peak postprandial:   less than 180 mg/dL (1-2 hours)      Critically ill patients:  140 - 180 mg/dL   Lab Results  Component Value Date   GLUCAP 75 12/10/2021   HGBA1C 7.8 (H) 12/10/2021    Review of Glycemic Control  Diabetes history: DM2 Outpatient Diabetes medications: glipizide 10 mg BID, Basaglar 10 units QD, metformin 500 BID Current orders for Inpatient glycemic control: IV insulin transitioned to Semglee 10 units QD, Novolog 0-9 units Q4H  HgbA1C - 7.8% CHO mod diet  Inpatient Diabetes Program Recommendations:    Change Novolog to 0-9 units TID with meals and 0-5 HS  Spoke with pt at bedside in ED. Pt states he does not have his Basaglar at home, states "the woman was supposed to give it to me." Pt states he's taking glipizide and metformin. Very sleepy and was not wanting to talk. Will be transferred to floor and will speak with pt tomorrow am about his diabetes control.  Continue to follow.   Thank you. Lorenda Peck, RD, LDN, [facility] Inpatient Diabetes Coordinator [phone]

## 2021-12-10 NOTE — ED Notes (Signed)
CBG 228 

## 2021-12-10 NOTE — Progress Notes (Signed)
[facility] Progress Note Patient: James Bradford QQP:[id number] DOB: [birth year] DOA: 12/09/2021  DOS: the patient was seen and examined on 12/10/2021  Brief hospital course: James Bradford is a 56 y.o. male with PMH significant of HLD, HTN, marijuana use, alcohol use, type II DM, CKD 2.  Patient presented to the hospital with complaints of abdominal pain along with nausea and vomiting.  Found to have DKA.  Treated with IV insulin.  Also has esophagitis.  Continues to have intractable nausea and vomiting.  Assessment and Plan: DKA (diabetic ketoacidosis) ([place]) Anion gap 18. Beta-hydroxybutyrate Mildly Elevated. Has Hyperglycemia. Presents with Nausea Vomiting. Did Not Use His Insulin for Last 3 Days. Treated with IV fluids and IV insulin. Currently basal bolus regimen. Due to poor p.o. intake and ongoing nausea and vomiting utilizing only every 4 hour scale.   Intractable nausea and vomiting. In the setting of DKA and alcohol use. As needed Zofran.  And scheduled Reglan. Also has history of cannabis abuse,?  Cyclical vomiting syndrome.   Esophagitis. Seen on the CT scan. We will treat with PPI twice daily and Carafate. If no improvement may require GI input.  Type 2 diabetes mellitus, uncontrolled with hyperglycemia with chronic kidney disease with long-term insulin use. Hold metformin and glipizide. Hemoglobin A1c 13.5 range August 2023. Hemoglobin A1c 7.8. Currently on every 4 hours scale due to poor p.o. intake.   Acute kidney injury on CKD stage II. Baseline serum creatinine around 1.2. On admission serum creatinine 3.19. Currently receiving aggressive IV hydration. We will monitor.  Hyponatremia mostly pseudohyponatremia in the setting of hyperglycemia.  Principal Problem:   DKA (diabetic ketoacidosis) ([place]) Active Problems:   Essential hypertension   Hyperlipidemia   AKI (acute kidney injury) ([place])   Intractable nausea and vomiting   Uncontrolled type 2  diabetes mellitus with hyperglycemia, with long-term current use of insulin ([facility])   Subjective: Continues to have nausea and vomiting but also reports some hiccups.  No fever no chills.  Passing gas but no BM.  Physical Exam: General: in moderate distress;  Cardiovascular: S1 and S2 Present, no Murmur Respiratory: Normal respiratory effort, Bilateral Air entry present, no Crackles, no wheezes Abdomen: Bowel Sound present, diffusely tender Extremities: No edema Neurology: alert and oriented to time, place, and person   Data Reviewed: I have Reviewed nursing notes, Vitals, and Lab results. Since last encounter, pertinent lab results CBC and BMP   . I have ordered test including CBC and BMP  .   Disposition: Status is: Inpatient Remains inpatient appropriate because: Need to tolerate oral diet  SCDs Start: 12/09/21 1611   Family Communication: No one at bedside Level of care: Telemetry continue telemetry due to sinus tachycardia and hypertension  Vitals:   12/10/21 1408 12/10/21 1528 12/10/21 1600 12/10/21 1946  BP: (!) 153/92 (!) 154/92 (!) 154/92 (!) 144/104  Pulse: (!) 104 92 92 (!) 115  Resp: 16   18  Temp: 99.4 F (37.4 C) 99.2 F (37.3 C)  99.9 F (37.7 C)  TempSrc: Oral Oral  Oral  SpO2: 98% 100%  100%  Weight:      Height:         Author: Berle Mull, MD 12/10/2021 9:05 PM  Please look on [email or web address] to find out who is on call.

## 2021-12-10 NOTE — ED Notes (Signed)
Report given to Illene Labrador.

## 2021-12-10 NOTE — ED Notes (Signed)
IV insulin gtt paused from prior RN due to hypoglycemic episode. Pause continued by this RN until further orders from attending and resolution of low CBG. Admitting doc just rounded to see pt, and after 2 consecutive high CBG's, ok to restart gtt per doc. IV insulin gtt restarted at this time - 7.5U/hr per endotool protocol.

## 2021-12-10 NOTE — Hospital Course (Signed)
James Bradford is a 56 y.o. male with PMH significant of HLD, HTN, marijuana use, alcohol use, type II DM, CKD 2.  Patient presented to the hospital with complaints of abdominal pain along with nausea and vomiting.  Found to have DKA.  Treated with IV insulin.  Also has esophagitis.  Continues to have intractable nausea and vomiting.

## 2021-12-11 DIAGNOSIS — R112 Nausea with vomiting, unspecified: Secondary | ICD-10-CM | POA: Diagnosis not present

## 2021-12-11 DIAGNOSIS — N179 Acute kidney failure, unspecified: Secondary | ICD-10-CM

## 2021-12-11 DIAGNOSIS — I10 Essential (primary) hypertension: Secondary | ICD-10-CM | POA: Diagnosis not present

## 2021-12-11 DIAGNOSIS — E1110 Type 2 diabetes mellitus with ketoacidosis without coma: Secondary | ICD-10-CM | POA: Diagnosis not present

## 2021-12-11 LAB — BASIC METABOLIC PANEL
Anion gap: 13 (ref 5–15)
BUN: 21 mg/dL — ABNORMAL HIGH (ref 6–20)
CO2: 22 mmol/L (ref 22–32)
Calcium: 9.6 mg/dL (ref 8.9–10.3)
Chloride: 100 mmol/L (ref 98–111)
Creatinine, Ser: 1.24 mg/dL (ref 0.61–1.24)
GFR, Estimated: >60 mL/min (ref >60)
Glucose, Bld: 190 mg/dL — ABNORMAL HIGH (ref 70–99)
Potassium: 3.3 mmol/L — ABNORMAL LOW (ref 3.5–5.1)
Sodium: 135 mmol/L (ref 135–145)

## 2021-12-11 LAB — CBC
HCT: 41.6 % (ref 39.0–52.0)
Hemoglobin: 14.1 g/dL (ref 13.0–17.0)
MCH: 31.7 pg (ref 26.0–34.0)
MCHC: 33.9 g/dL (ref 30.0–36.0)
MCV: 93.5 fL (ref 80.0–100.0)
Platelets: 308 10*3/uL (ref 150–400)
RBC: 4.45 MIL/uL (ref 4.22–5.81)
RDW: 12.1 % (ref 11.5–15.5)
WBC: 8.2 10*3/uL (ref 4.0–10.5)
nRBC: 0 % (ref 0.0–0.2)

## 2021-12-11 LAB — MAGNESIUM: Magnesium: 1.9 mg/dL (ref 1.7–2.4)

## 2021-12-11 LAB — GLUCOSE, CAPILLARY
Glucose-Capillary: 170 mg/dL — ABNORMAL HIGH (ref 70–99)
Glucose-Capillary: 171 mg/dL — ABNORMAL HIGH (ref 70–99)
Glucose-Capillary: 169 mg/dL — ABNORMAL HIGH (ref 70–99)
Glucose-Capillary: 170 mg/dL — ABNORMAL HIGH (ref 70–99)
Glucose-Capillary: 130 mg/dL — ABNORMAL HIGH (ref 70–99)
Glucose-Capillary: 175 mg/dL — ABNORMAL HIGH (ref 70–99)

## 2021-12-11 MED ORDER — POTASSIUM CHLORIDE 10 MEQ/100ML IV SOLN
10.0000 meq | INTRAVENOUS | Status: AC
  Administered 2021-12-11 (×4): 10 meq via INTRAVENOUS
  Filled 2021-12-11 (×4): qty 100

## 2021-12-11 NOTE — Progress Notes (Signed)
[facility] Progress Note   Patient: James Bradford NLZ:[id number] DOB: 1965/04/16 DOA: 12/09/2021  DOS: the patient was seen and examined on 12/11/2021  Brief hospital course: James Bradford is a 56 y.o. male with PMH significant of HLD, HTN, marijuana use, alcohol use, type II DM, CKD 2. Patient presented to the hospital with complaints of abdominal pain along with nausea and vomiting. Found to have DKA.  Treated with IV insulin.  Also has esophagitis.  Continues to have intractable nausea and vomiting.  Assessment and Plan: DKA (diabetic ketoacidosis) ([facility]) S/p IV fluids and IV insulin. Currently basal bolus regimen. Due to poor p.o. intake and ongoing nausea and vomiting utilizing only every 4 hour scale   Intractable nausea and vomiting In the setting of DKA and alcohol use Vs history of cannabis abuse,?  Cyclical vomiting syndrome Vs gastroparesis  As needed Zofran, scheduled Reglan. Advance diet as tolerated   Esophagitis Seen on the CT scan PPI twice daily and Carafate. If no improvement may require GI input.  Type 2 diabetes mellitus, uncontrolled with hyperglycemia with chronic kidney disease with long-term insulin use Hold metformin and glipizide Hemoglobin A1c 7.8. Currently on every 4 hours scale due to poor p.o. intake.   Acute kidney injury on CKD stage II Baseline serum creatinine around 1.2. On admission serum creatinine 3.19. Currently receiving IV hydration.  Hypokalemia Replace as needed  Principal Problem:   DKA (diabetic ketoacidosis) ([facility]) Active Problems:   Essential hypertension   Hyperlipidemia   AKI (acute kidney injury) ([facility])   Intractable nausea and vomiting   Uncontrolled type 2 diabetes mellitus with hyperglycemia, with long-term current use of insulin ([facility])   Subjective Continues to complain of intractable nausea/vomiting.  However this evening, patient requested to have a diet, will monitor.  Physical Exam: General: NAD   Cardiovascular: S1, S2 present Respiratory: CTAB Abdomen: Soft, nontender, nondistended, bowel sounds present Musculoskeletal: No bilateral pedal edema noted Skin: Normal Psychiatry: Normal mood    Data Reviewed: I have Reviewed nursing notes, Vitals, and Lab results. Since last encounter, pertinent lab results CBC and BMP   . I have ordered test including CBC and BMP  .   Disposition: Status is: Inpatient Remains inpatient appropriate because: Need to tolerate oral diet  SCDs Start: 12/09/21 1611   Family Communication: None at bedside      Author: Briant Cedar, MD 12/11/2021 6:04 PM     Please look on [email or web address] to find out who is on call.

## 2021-12-12 DIAGNOSIS — N179 Acute kidney failure, unspecified: Secondary | ICD-10-CM | POA: Diagnosis not present

## 2021-12-12 DIAGNOSIS — R112 Nausea with vomiting, unspecified: Secondary | ICD-10-CM | POA: Diagnosis not present

## 2021-12-12 DIAGNOSIS — I10 Essential (primary) hypertension: Secondary | ICD-10-CM | POA: Diagnosis not present

## 2021-12-12 DIAGNOSIS — E1110 Type 2 diabetes mellitus with ketoacidosis without coma: Secondary | ICD-10-CM | POA: Diagnosis not present

## 2021-12-12 LAB — BASIC METABOLIC PANEL
Anion gap: 10 (ref 5–15)
BUN: 19 mg/dL (ref 6–20)
CO2: 24 mmol/L (ref 22–32)
Calcium: 9.4 mg/dL (ref 8.9–10.3)
Chloride: 105 mmol/L (ref 98–111)
Creatinine, Ser: 1.45 mg/dL — ABNORMAL HIGH (ref 0.61–1.24)
GFR, Estimated: 57 mL/min — ABNORMAL LOW (ref >60)
Glucose, Bld: 154 mg/dL — ABNORMAL HIGH (ref 70–99)
Potassium: 3.7 mmol/L (ref 3.5–5.1)
Sodium: 139 mmol/L (ref 135–145)

## 2021-12-12 LAB — GLUCOSE, CAPILLARY
Glucose-Capillary: 122 mg/dL — ABNORMAL HIGH (ref 70–99)
Glucose-Capillary: 139 mg/dL — ABNORMAL HIGH (ref 70–99)
Glucose-Capillary: 145 mg/dL — ABNORMAL HIGH (ref 70–99)
Glucose-Capillary: 207 mg/dL — ABNORMAL HIGH (ref 70–99)
Glucose-Capillary: 231 mg/dL — ABNORMAL HIGH (ref 70–99)
Glucose-Capillary: 250 mg/dL — ABNORMAL HIGH (ref 70–99)

## 2021-12-12 LAB — CBC
HCT: 37.2 % — ABNORMAL LOW (ref 39.0–52.0)
Hemoglobin: 12.9 g/dL — ABNORMAL LOW (ref 13.0–17.0)
MCH: 32.1 pg (ref 26.0–34.0)
MCHC: 34.7 g/dL (ref 30.0–36.0)
MCV: 92.5 fL (ref 80.0–100.0)
Platelets: 276 10*3/uL (ref 150–400)
RBC: 4.02 MIL/uL — ABNORMAL LOW (ref 4.22–5.81)
RDW: 11.9 % (ref 11.5–15.5)
WBC: 5.3 10*3/uL (ref 4.0–10.5)
nRBC: 0 % (ref 0.0–0.2)

## 2021-12-12 LAB — MAGNESIUM: Magnesium: 1.9 mg/dL (ref 1.7–2.4)

## 2021-12-12 MED ORDER — INSULIN ASPART 100 UNIT/ML IJ SOLN
0.0000 [IU] | Freq: Three times a day (TID) | INTRAMUSCULAR | Status: DC
  Administered 2021-12-12: 1 [IU] via SUBCUTANEOUS
  Administered 2021-12-13 (×2): 2 [IU] via SUBCUTANEOUS

## 2021-12-12 MED ORDER — CHLORPROMAZINE HCL 10 MG PO TABS
10.0000 mg | ORAL_TABLET | Freq: Once | ORAL | Status: AC
  Administered 2021-12-12: 10 mg via ORAL
  Filled 2021-12-12: qty 1

## 2021-12-12 MED ORDER — INSULIN ASPART 100 UNIT/ML IJ SOLN
0.0000 [IU] | Freq: Every day | INTRAMUSCULAR | Status: DC
  Administered 2021-12-12: 2 [IU] via SUBCUTANEOUS

## 2021-12-12 NOTE — Inpatient Diabetes Management (Signed)
Inpatient Diabetes Program Recommendations  AACE/ADA: New Consensus Statement on Inpatient Glycemic Control (2015)  Target Ranges:  Prepandial:   less than 140 mg/dL      Peak postprandial:   less than 180 mg/dL (1-2 hours)      Critically ill patients:  140 - 180 mg/dL   Lab Results  Component Value Date   GLUCAP 231 (H) 12/12/2021   HGBA1C 7.8 (H) 12/10/2021    Review of Glycemic Control  Diabetes history: DM2 Outpatient Diabetes medications: Basaglar 10 QD, glipizide 10 BID, metformin 500 mg BID Current orders for Inpatient glycemic control: Semglee 5 units Q24H, Novolog 0-9 units Q4H  HgbA1C - 7.8%  Inpatient Diabetes Program Recommendations:    Consider adding Novolog 2 units TID for meal coverage insulin  Spoke with pt at bedside regarding his diabetes control. Pt states he has appt with Endo Dr Roanna Raider this month. Requesting MD's phone number to find out exactly when and what time his appt is. States he has occasional hypos at home in early am. Had recently decreased basal insulin to 5 units and this seemed to help. Discussed taking a meal time insulin and pt is not opposed to this, if needed. Wants to know his blood type because a friend of his is recommending book called Diabetes and your blood type. States he has meter at home and will get another Dexcom when he goes to PCP/Endo visit. Monitors blood sugars approx 2x/day. Stressed importance of controlling blood sugars to prevent both long and short-term complications. Answered all questions. Pt very appreciative of visit. Discussed above with RN.  Thank you. Ailene Ards, RD, LDN, [facility] Inpatient Diabetes Coordinator [phone]

## 2021-12-12 NOTE — TOC Initial Note (Signed)
Transition of Care [facility]) - Initial/Assessment Note    Patient Details  Name: James Bradford MRN: [id number] Date of Birth: 1965/03/02  Transition of Care [facility]) CM/SW Contact:    Golda Acre, RN Phone Number: 12/12/2021, 7:36 AM  Clinical Narrative:                  Transition of Care [facility]) Screening Note   Patient Details  Name: James Bradford Date of Birth: 12/12/1965   Transition of Care [facility]) CM/SW Contact:    Golda Acre, RN Phone Number: 12/12/2021, 7:36 AM    Transition of Care Department [facility]) has reviewed patient and no TOC needs have been identified at this time. We will continue to monitor patient advancement through interdisciplinary progression rounds. If new patient transition needs arise, please place a TOC consult.    Expected Discharge Plan: Home/Self Care Barriers to Discharge: Continued Medical Work up   Patient Goals and CMS Choice Patient states their goals for this hospitalization and ongoing recovery are:: to return home CMS Medicare.gov Compare Post Acute Care list provided to:: Patient    Expected Discharge Plan and Services Expected Discharge Plan: Home/Self Care   Discharge Planning Services: CM Consult   Living arrangements for the past 2 months: Single Family Home                                      Prior Living Arrangements/Services Living arrangements for the past 2 months: Single Family Home Lives with:: Self Patient language and need for interpreter reviewed:: Yes Do you feel safe going back to the place where you live?: Yes            Criminal Activity/Legal Involvement Pertinent to Current Situation/Hospitalization: No - Comment as needed  Activities of Daily Living Home Assistive Devices/Equipment: Eyeglasses, CBG Meter (dex com g6  - pt states needs to be inserted) ADL Screening (condition at time of admission) Patient's cognitive ability adequate to safely complete daily activities?: Yes Is the  patient deaf or have difficulty hearing?: No Does the patient have difficulty seeing, even when wearing glasses/contacts?: No Does the patient have difficulty concentrating, remembering, or making decisions?: Yes Patient able to express need for assistance with ADLs?: Yes Does the patient have difficulty dressing or bathing?: No Independently performs ADLs?: Yes (appropriate for developmental age) Does the patient have difficulty walking or climbing stairs?: No Weakness of Legs: Both Weakness of Arms/Hands: Both  Permission Sought/Granted                  Emotional Assessment Appearance:: Appears stated age Attitude/Demeanor/Rapport: Engaged Affect (typically observed): Calm Orientation: : Oriented to Self, Oriented to Place, Oriented to  Time, Oriented to Situation Alcohol / Substance Use: Illicit Drugs (marijuana) Psych Involvement: No (comment)  Admission diagnosis:  DKA (diabetic ketoacidosis) (HCC) [E11.10] Hyperglycemia [R73.9] AKI (acute kidney injury) (HCC) [N17.9] 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] Nausea and vomiting, unspecified vomiting type [R11.2] Patient Active Problem List   Diagnosis Date Noted   Nausea & vomiting 09/09/2021   Uncontrolled type 2 diabetes mellitus with hyperglycemia, with long-term current use of insulin (HCC) 01/13/2021   Hyperglycemia 01/12/2021   DKA (diabetic ketoacidosis) (HCC) 01/12/2021   Intractable nausea and vomiting 09/28/2020   Marijuana abuse 09/28/2020   Sinus bradycardia 08/24/2020   Aortic atherosclerosis (HCC) 08/24/2020   AKI (acute kidney injury) (HCC) 08/23/2020   Type 2 diabetes mellitus (HCC)  08/23/2020   Normocytic anemia 08/23/2020   Type 2 diabetes mellitus with hyperosmolar hyperglycemic state (HHS) ([place]) 04/23/2019   Hyperlipidemia    Stage 3 chronic kidney disease ([place])    ERECTILE DYSFUNCTION 04/27/2008   Unspecified glaucoma 02/18/2008   Essential hypertension 02/16/2008   PCP:  [facility]:   [place] [place] [address] Phone: [phone] Fax: [phone]  [facility] [address] [place] [address] Phone: [phone] Fax: [phone]     Social Determinants of Health (SDOH) Interventions    Readmission Risk Interventions   No data to display

## 2021-12-12 NOTE — Progress Notes (Signed)
Initial Nutrition Assessment  DOCUMENTATION CODES:   Not applicable  INTERVENTION:  -Continue soft diet as tolerated -Provide education as needed  NUTRITION DIAGNOSIS:  Altered nutrition lab value related to chronic illness (DM) as evidenced by other (comment) (BG 398 on admission).  GOAL:  Patient will meet greater than or equal to 90% of their needs  MONITOR:  PO intake  REASON FOR ASSESSMENT:  Malnutrition Screening Tool    ASSESSMENT:  Pt is a 56yo M with PMH of HLD, HTN, marijuana use, alcohol use, type II DM, and CKD 2 who presents with abdominal pain, nausea and vomiting, found to have DKA.  Visited pt at bedside this afternoon. He reports a good appetite and denies n/v and recent weight loss. He was seen by diabetes coordinator earlier and had no DM related questions or concerns. No physical signs of malnutrition. Pt noted to have esophagitis on admission. On soft diet and tolerating it well. Eating 100% of meal provided. Pt notes not taking insulin x 3 days PTA. A1c checked during admission is 7.8 which is down from 13.5 in August.   DKA likely in the setting of medication non compliance which was addressed by diabetes educator. No nutrition education needs identified at this time. RD available for education prn.   Medications reviewed and include: folic acid, novolog, semglee, reglan, MVI, protonix, pravastatin, carafate, thiamine, LR @75mL /hr  Labs reviewed: BG:122-250, GFR:57, Cr:1.45, A1c:7.8 (down from 13.5 3 months ago)  NUTRITION - FOCUSED PHYSICAL EXAM:  Flowsheet Row Most Recent Value  Orbital Region No depletion  Upper Arm Region No depletion  Thoracic and Lumbar Region No depletion  Buccal Region No depletion  Temple Region No depletion  Clavicle Bone Region No depletion  Clavicle and Acromion Bone Region No depletion  Scapular Bone Region Unable to assess  Dorsal Hand No depletion  Patellar Region No depletion  Anterior Thigh Region No depletion   Posterior Calf Region No depletion  Hair Reviewed  Eyes Reviewed  Mouth Reviewed  Skin Reviewed  Nails Reviewed       Diet Order:   Diet Order             DIET SOFT Room service appropriate? Yes; Fluid consistency: Thin  Diet effective now                   EDUCATION NEEDS:  No education needs have been identified at this time  Skin:  Skin Assessment: Reviewed RN Assessment  Last BM:  11/6  Height:   Ht Readings from Last 1 Encounters:  12/09/21 5\' 7"  (1.702 m)    Weight:   Wt Readings from Last 1 Encounters:  12/09/21 68 kg    Ideal Body Weight:     BMI:  Body mass index is 23.48 kg/m.  Estimated Nutritional Needs:  Kcal:  1700-2040kcal Protein:  80-100g Fluid:  1700-2056mL  13/06/23, MS, RD, LDN, CNSC See AMiON for contact information

## 2021-12-12 NOTE — Progress Notes (Signed)
[facility] Progress Note   Patient: Leonidus Rowand IOE:[id number] DOB: 1965/07/05 DOA: 12/09/2021  DOS: the patient was seen and examined on 12/12/2021  Brief hospital course: Zaven Klemens is a 56 y.o. male with PMH significant of HLD, HTN, marijuana use, alcohol use, type II DM, CKD 2. Patient presented to the hospital with complaints of abdominal pain along with nausea and vomiting. Found to have DKA.  Treated with IV insulin.  Also has esophagitis.  Continues to have intractable nausea and vomiting.    Assessment and Plan: DKA (diabetic ketoacidosis) (HCC) S/p IV fluids and IV insulin Currently basal bolus regimen   Intractable nausea and vomiting In the setting of DKA and alcohol use Vs history of cannabis abuse,?  Cyclical vomiting syndrome Vs gastroparesis  As needed Zofran, scheduled Reglan Advance diet as tolerated   Esophagitis Seen on the CT scan PPI twice daily and Carafate  Type 2 diabetes mellitus, uncontrolled with hyperglycemia with chronic kidney disease with long-term insulin use Hold metformin and glipizide Hemoglobin A1c 7.8. SSI, Semglee, hypoglycemic protocol   Acute kidney injury on CKD stage II Baseline serum creatinine around 1.2. On admission serum creatinine 3.19. Currently receiving IV hydration.  Hypokalemia Replace as needed  Principal Problem:   DKA (diabetic ketoacidosis) (HCC) Active Problems:   Essential hypertension   Hyperlipidemia   AKI (acute kidney injury) (HCC)   Intractable nausea and vomiting   Uncontrolled type 2 diabetes mellitus with hyperglycemia, with long-term current use of insulin (HCC)   Subjective Patient reports he has had hiccups for the past few days that is persistent.  Trial of Thorazine.  Reports he is able to keep down his clear liquid diet without significant nausea or vomiting.  Plan to advance his diet to soft diet   Physical Exam: General: NAD  Cardiovascular: S1, S2 present Respiratory:  CTAB Abdomen: Soft, nontender, nondistended, bowel sounds present Musculoskeletal: No bilateral pedal edema noted Skin: Normal Psychiatry: Normal mood    Data Reviewed: I have Reviewed nursing notes, Vitals, and Lab results. Since last encounter, pertinent lab results CBC and BMP   . I have ordered test including CBC and BMP  .   Disposition: Status is: Inpatient Remains inpatient appropriate because: Need to tolerate oral diet  SCDs Start: 12/09/21 1611   Family Communication: None at bedside     Author: Briant Cedar, MD 12/12/2021 2:35 PM     Please look on [email or web address] to find out who is on call.

## 2021-12-12 NOTE — Progress Notes (Signed)
Mobility Specialist - Progress Note   12/12/21 1204  Mobility  Activity Ambulated with assistance in hallway  Level of Assistance Modified independent, requires aide device or extra time  Assistive Device  (IV Pole)  Distance Ambulated (ft) 350 ft  Activity Response Tolerated well  Mobility Referral Yes  $Mobility charge 1 Mobility   Pt received in bed and agreeable to mobility. No complaints during mobility. Pt to bed after session with all needs met.    [facility]

## 2021-12-12 NOTE — Inpatient Diabetes Management (Signed)
Pt requested educational material relating to diabetic diet. Pt given a handout from [employer]. Pt read the material and requested website for more information.

## 2021-12-13 LAB — BASIC METABOLIC PANEL
Anion gap: 6 (ref 5–15)
BUN: 19 mg/dL (ref 6–20)
CO2: 27 mmol/L (ref 22–32)
Calcium: 9 mg/dL (ref 8.9–10.3)
Chloride: 105 mmol/L (ref 98–111)
Creatinine, Ser: 1.4 mg/dL — ABNORMAL HIGH (ref 0.61–1.24)
GFR, Estimated: 59 mL/min — ABNORMAL LOW (ref >60)
Glucose, Bld: 155 mg/dL — ABNORMAL HIGH (ref 70–99)
Potassium: 3.6 mmol/L (ref 3.5–5.1)
Sodium: 138 mmol/L (ref 135–145)

## 2021-12-13 LAB — GLUCOSE, CAPILLARY
Glucose-Capillary: 110 mg/dL — ABNORMAL HIGH (ref 70–99)
Glucose-Capillary: 162 mg/dL — ABNORMAL HIGH (ref 70–99)
Glucose-Capillary: 182 mg/dL — ABNORMAL HIGH (ref 70–99)
Glucose-Capillary: 187 mg/dL — ABNORMAL HIGH (ref 70–99)
Glucose-Capillary: 197 mg/dL — ABNORMAL HIGH (ref 70–99)

## 2021-12-13 MED ORDER — HYDRALAZINE HCL 25 MG PO TABS: 25.0000 mg | ORAL_TABLET | Freq: Three times a day (TID) | ORAL | 0 refills | Status: DC

## 2021-12-13 MED ORDER — PANTOPRAZOLE SODIUM 40 MG PO TBEC: 40.0000 mg | DELAYED_RELEASE_TABLET | Freq: Two times a day (BID) | ORAL | 0 refills | Status: DC

## 2021-12-13 MED ORDER — SUCRALFATE 1 G PO TABS: 1.0000 g | ORAL_TABLET | Freq: Four times a day (QID) | ORAL | 0 refills | Status: DC

## 2021-12-13 NOTE — Progress Notes (Addendum)
Patient left hospital without receiving discharge instructions or AVS.  PIV and cardiac monitoring were removed, patient left with his belongings, bus pass, and note for work.  MD and charge RN notified.  Bradd Burner, RN

## 2021-12-13 NOTE — TOC Progression Note (Signed)
Transition of Care [facility]) - Progression Note    Patient Details  Name: James Bradford MRN: [id number] Date of Birth: [birth year]  Transition of Care [facility]) CM/SW Contact  Adrian Prows, RN Phone Number: 12/13/2021, 9:27 AM  Clinical Narrative:    [facility] consult for substance abuse counseling / education; spoke with pt in room; he is from home and plans to return at discharge; the pt says he does not have transportation home and will need a bus pass b/c he forgot his wallet; the pt says he does not need information related to substance abuse; TOC will con't to follow.   Expected Discharge Plan: Home/Self Care Barriers to Discharge: Continued Medical Work up  Expected Discharge Plan and Services Expected Discharge Plan: Home/Self Care   Discharge Planning Services: CM Consult   Living arrangements for the past 2 months: Single Family Home                                       Social Determinants of Health (SDOH) Interventions    Readmission Risk Interventions     No data to display

## 2021-12-13 NOTE — Progress Notes (Signed)
Mobility Specialist - Progress Note   12/13/21 0938  Mobility  Activity Ambulated with assistance in hallway  Level of Assistance Independent after set-up  Assistive Device  (IV Pole)  Distance Ambulated (ft) 700 ft  Activity Response Tolerated well  Mobility Referral Yes  $Mobility charge 1 Mobility   Pt received in bed and agreeable to mobility. No complaints during mobility. Pt to sink after session with all needs met.      [facility]

## 2021-12-13 NOTE — TOC Transition Note (Signed)
Transition of Care [facility]) - CM/SW Discharge Note   Patient Details  Name: James Bradford MRN: [id number] Date of Birth: September 21, 1965  Transition of Care [facility]) CM/SW Contact:  Adrian Prows, RN Phone Number: 12/13/2021, 1:18 PM   Clinical Narrative:    D/C orders written for pt; notified he will need a bus pass for transport him; bus pass given to [place], Charity fundraiser; no TOC needs.     Barriers to Discharge: Continued Medical Work up   Patient Goals and CMS Choice Patient states their goals for this hospitalization and ongoing recovery are:: to return home CMS Medicare.gov Compare Post Acute Care list provided to:: Patient    Discharge Placement                       Discharge Plan and Services   Discharge Planning Services: CM Consult                                 Social Determinants of Health (SDOH) Interventions     Readmission Risk Interventions     No data to display

## 2021-12-14 NOTE — Discharge Summary (Signed)
Physician Discharge Summary   Patient: James Bradford MRN: [id number] DOB: 21-Nov-1965  Admit date:     12/09/2021  Discharge date: 12/13/2021  Discharge Physician: Alma Friendly   PCP: [facility]   Recommendations at discharge:   Follow-up with PCP in 1 week Follow-up with endocrinology   Discharge Diagnoses: Principal Problem:   DKA (diabetic ketoacidosis) ([name]) Active Problems:   Essential hypertension   Hyperlipidemia   AKI (acute kidney injury) ([name])   Intractable nausea and vomiting   Uncontrolled type 2 diabetes mellitus with hyperglycemia, with long-term current use of insulin [facility])   Hospital Course: Wilver Tignor is a 56 y.o. male with PMH significant of HLD, HTN, marijuana use, alcohol use, type II DM, CKD 2. Patient presented to the hospital with complaints of abdominal pain along with nausea and vomiting. Found to have DKA.  Treated with IV insulin.  Also has esophagitis.  Patient stabilized and further discharged.  Advised to be compliant with medication and appointments.   Assessment and Plan:  DKA (diabetic ketoacidosis) (James Bradford) Resolved S/p IV fluids and IV insulin Continue home diabetic regimen   Intractable nausea and vomiting Resolved In the setting of DKA and alcohol use Vs history of cannabis abuse,?  Cyclical vomiting syndrome Vs gastroparesis  Tolerated advance diet   Esophagitis Seen on the CT scan PPI twice daily and Carafate   Type 2 diabetes mellitus, uncontrolled with hyperglycemia with chronic kidney disease with long-term insulin use Continue home diabetic regimen   Acute kidney injury on CKD stage II Improved s/p IV fluids Baseline serum creatinine around 1.2 On admission serum creatinine 3.19.   Hypokalemia Replaced as needed     Consultants: None Procedures performed: None Disposition: Home Diet recommendation:  Cardiac and Carb modified diet   DISCHARGE MEDICATION: Allergies as of  12/13/2021       Reactions   Ibuprofen Other (See Comments)   Hurts his stomach   Pork-derived Products    Patient does not eat pork products 2/2 to religious beliefs. Wants to avoid all pork containing medications   Aspirin Other (See Comments)   Stomach irritation        Medication List     STOP taking these medications    BLACK CURRANT SEED OIL PO   lisinopril 10 MG tablet Commonly known as: ZESTRIL   MULTIVITAMIN ADULT PO   ondansetron 8 MG disintegrating tablet Commonly known as: ZOFRAN-ODT       TAKE these medications    Accu-Chek Guide test strip Generic drug: glucose blood Use as instructed   Accu-Chek Softclix Lancets lancets Use as instructed   Basaglar KwikPen 100 UNIT/ML Inject 10 Units into the skin daily.   blood glucose meter kit and supplies Kit Dispense based on patient and insurance preference. Use up to four times daily as directed.   dicyclomine 20 MG tablet Commonly known as: BENTYL Take 1 tablet (20 mg total) by mouth 2 (two) times daily.   glipiZIDE 10 MG tablet Commonly known as: GLUCOTROL Take 10 mg by mouth 2 (two) times daily before a meal.   hydrALAZINE 25 MG tablet Commonly known as: APRESOLINE Take 1 tablet (25 mg total) by mouth every 8 (eight) hours.   Insulin Pen Needle 32G X 4 MM Misc Use with Insulin pen   KRILL OIL PO Take 1 capsule by mouth daily.   metFORMIN 500 MG tablet Commonly known as: Glucophage Take 1 tablet (500 mg total) by mouth 2 (two) times daily  with a meal.   pantoprazole 40 MG tablet Commonly known as: PROTONIX Take 1 tablet (40 mg total) by mouth 2 (two) times daily before a meal.   pravastatin 10 MG tablet Commonly known as: PRAVACHOL Take 1 tablet (10 mg total) by mouth daily.   sildenafil 100 MG tablet Commonly known as: VIAGRA Take 100 mg by mouth daily as needed for erectile dysfunction.   sucralfate 1 g tablet Commonly known as: Carafate Take 1 tablet (1 g total) by mouth 4  (four) times daily for 10 days.        Follow-up Information     [facility]. Schedule an appointment as soon as possible for a visit in 1 week(s).   Specialty: Family Medicine Contact information: [address] [place] [address] [phone]                Discharge Exam: James Bradford Weights   12/09/21 1226  Weight: 68 kg   General: NAD  Cardiovascular: S1, S2 present Respiratory: CTAB Abdomen: Soft, nontender, nondistended, bowel sounds present Musculoskeletal: No bilateral pedal edema noted Skin: Normal Psychiatry: Normal mood   Condition at discharge: stable  The results of significant diagnostics from this hospitalization (including imaging, microbiology, ancillary and laboratory) are listed below for reference.   Imaging Studies: CT ABDOMEN PELVIS WO CONTRAST  Result Date: 12/09/2021 CLINICAL DATA:  Upper abdominal pain. "Pancreatitis severe". Alcohol abuse. Uncontrolled type 2 diabetes. EXAM: CT ABDOMEN AND PELVIS WITHOUT CONTRAST TECHNIQUE: Multidetector CT imaging of the abdomen and pelvis was performed following the standard protocol without IV contrast. RADIATION DOSE REDUCTION: This exam was performed according to the departmental dose-optimization program which includes automated exposure control, adjustment of the mA and/or kV according to patient size and/or use of iterative reconstruction technique. COMPARISON:  10/03/2021 FINDINGS: Lower chest: Clear lung bases. Normal heart size without pericardial or pleural effusion. Suspect distal esophageal wall thickening, including on 07/02. Hepatobiliary: Normal liver. Normal gallbladder, without biliary ductal dilatation. Pancreas: Normal, without mass or ductal dilatation. Spleen: Normal in size, without focal abnormality. Adrenals/Urinary Tract: Minimal left adrenal thickening. Normal right adrenal gland. Hypoattenuating bilateral renal lesions of maximally 2.0 cm are most likely cysts . In  the absence of clinically indicated signs/symptoms require(s) no independent follow-up. No renal calculi or hydronephrosis. No hydroureter or ureteric calculi. No bladder calculi. Stomach/Bowel: Normal stomach, without wall thickening. Normal colon. Appendix not visualized. Normal caliber small bowel. Vascular/Lymphatic: Normal caliber of the aorta and branch vessels. No abdominopelvic adenopathy. Reproductive: Normal prostate. Other: No significant free fluid. Musculoskeletal: Mild disc bulge at the lumbosacral junction. IMPRESSION: 1. No urinary tract calculi or hydronephrosis. 2. No explanation for patient's symptoms, given limitations of stone study technique. 3. Distal esophageal wall thickening, suspicious for esophagitis. Electronically Signed   By: Abigail Miyamoto M.D.   On: 12/09/2021 14:53    Microbiology: Results for orders placed or performed during the hospital encounter of 09/09/21  MRSA Next Gen by PCR, Nasal     Status: None   Collection Time: 09/09/21 10:43 PM   Specimen: Nasal Mucosa; Nasal Swab  Result Value Ref Range Status   MRSA by PCR Next Gen NOT DETECTED NOT DETECTED Final    Comment: (NOTE) The GeneXpert MRSA Assay (FDA approved for NASAL specimens only), is one component of a comprehensive MRSA colonization surveillance program. It is not intended to diagnose MRSA infection nor to guide or monitor treatment for MRSA infections. Test performance is not FDA approved in patients  less than 43 years old. Performed at [facility], [place] [address] [place], [place] [address]     Labs: CBC: Recent Labs  Lab 12/09/21 1251 12/10/21 0552 12/11/21 0447 12/12/21 0359  WBC 10.2 12.2* 8.2 5.3  NEUTROABS 8.0*  --   --   --   HGB 14.3 13.4 14.1 12.9*  HCT 41.0 40.3 41.6 37.2*  MCV 91.9 95.7 93.5 92.5  PLT 319 269 308 280   Basic Metabolic Panel: Recent Labs  Lab 12/10/21 0459 12/10/21 1606 12/11/21 0447 12/12/21 0359 12/13/21 0347  NA 135 140 135  139 138  K 3.5 3.6 3.3* 3.7 3.6  CL 103 98 100 105 105  CO2 _0 GLUCOSE 180* 73 190* 154* 155*  BUN 37* 27* 21* 19 19  CREATININE 2.03* 1.35* 1.24 1.45* 1.40*  CALCIUM 9.4 10.0 9.6 9.4 9.0  MG 2.5*  --  1.9 1.9  --    Liver Function Tests: Recent Labs  Lab 12/09/21 1251  AST 26  ALT 31  ALKPHOS 76  BILITOT 1.2  PROT 8.4*  ALBUMIN 4.9   CBG: Recent Labs  Lab 12/13/21 0012 12/13/21 0513 12/13/21 0736 12/13/21 1157 12/13/21 1217  GLUCAP 110* 162* 187* 182* 197*    Discharge time spent: greater than 30 minutes.  Signed: Alma Friendly, MD [facility] 12/14/2021

## 2022-03-18 ENCOUNTER — Inpatient Hospital Stay (HOSPITAL_COMMUNITY): Payer: BLUE CROSS/BLUE SHIELD

## 2022-03-18 ENCOUNTER — Encounter (HOSPITAL_COMMUNITY): Payer: Self-pay

## 2022-03-18 ENCOUNTER — Other Ambulatory Visit: Payer: Self-pay

## 2022-03-18 ENCOUNTER — Inpatient Hospital Stay (HOSPITAL_COMMUNITY)
Admission: EM | Admit: 2022-03-18 | Discharge: 2022-03-22 | DRG: 637 | Disposition: A | Discharge status: Home / Self Care | Payer: BLUE CROSS/BLUE SHIELD | Attending: Internal Medicine | Admitting: Internal Medicine

## 2022-03-18 DIAGNOSIS — K920 Hematemesis: Secondary | ICD-10-CM | POA: Diagnosis not present

## 2022-03-18 DIAGNOSIS — I129 Hypertensive chronic kidney disease with stage 1 through stage 4 chronic kidney disease, or unspecified chronic kidney disease: Secondary | ICD-10-CM | POA: Diagnosis present

## 2022-03-18 DIAGNOSIS — H409 Unspecified glaucoma: Secondary | ICD-10-CM | POA: Diagnosis present

## 2022-03-18 DIAGNOSIS — I10 Essential (primary) hypertension: Secondary | ICD-10-CM | POA: Diagnosis not present

## 2022-03-18 DIAGNOSIS — N179 Acute kidney failure, unspecified: Secondary | ICD-10-CM | POA: Diagnosis present

## 2022-03-18 DIAGNOSIS — E785 Hyperlipidemia, unspecified: Secondary | ICD-10-CM | POA: Diagnosis present

## 2022-03-18 DIAGNOSIS — Z800 Family history of malignant neoplasm of digestive organs: Secondary | ICD-10-CM

## 2022-03-18 DIAGNOSIS — F1010 Alcohol abuse, uncomplicated: Secondary | ICD-10-CM | POA: Diagnosis present

## 2022-03-18 DIAGNOSIS — E1110 Type 2 diabetes mellitus with ketoacidosis without coma: Principal | ICD-10-CM | POA: Diagnosis present

## 2022-03-18 DIAGNOSIS — E1122 Type 2 diabetes mellitus with diabetic chronic kidney disease: Secondary | ICD-10-CM | POA: Diagnosis present

## 2022-03-18 DIAGNOSIS — E869 Volume depletion, unspecified: Secondary | ICD-10-CM | POA: Diagnosis present

## 2022-03-18 DIAGNOSIS — Z794 Long term (current) use of insulin: Secondary | ICD-10-CM

## 2022-03-18 DIAGNOSIS — Z79899 Other long term (current) drug therapy: Secondary | ICD-10-CM

## 2022-03-18 DIAGNOSIS — D72829 Elevated white blood cell count, unspecified: Secondary | ICD-10-CM | POA: Diagnosis present

## 2022-03-18 DIAGNOSIS — Z8249 Family history of ischemic heart disease and other diseases of the circulatory system: Secondary | ICD-10-CM | POA: Diagnosis not present

## 2022-03-18 DIAGNOSIS — E878 Other disorders of electrolyte and fluid balance, not elsewhere classified: Secondary | ICD-10-CM | POA: Diagnosis present

## 2022-03-18 DIAGNOSIS — Z91014 Allergy to mammalian meats: Secondary | ICD-10-CM

## 2022-03-18 DIAGNOSIS — N1831 Chronic kidney disease, stage 3a: Secondary | ICD-10-CM | POA: Diagnosis present

## 2022-03-18 DIAGNOSIS — Z7984 Long term (current) use of oral hypoglycemic drugs: Secondary | ICD-10-CM

## 2022-03-18 DIAGNOSIS — E872 Acidosis, unspecified: Secondary | ICD-10-CM | POA: Diagnosis present

## 2022-03-18 DIAGNOSIS — E871 Hypo-osmolality and hyponatremia: Secondary | ICD-10-CM | POA: Diagnosis present

## 2022-03-18 DIAGNOSIS — R1084 Generalized abdominal pain: Secondary | ICD-10-CM | POA: Diagnosis present

## 2022-03-18 DIAGNOSIS — K226 Gastro-esophageal laceration-hemorrhage syndrome: Secondary | ICD-10-CM | POA: Diagnosis present

## 2022-03-18 DIAGNOSIS — I700 Atherosclerosis of aorta: Secondary | ICD-10-CM | POA: Diagnosis present

## 2022-03-18 DIAGNOSIS — E1159 Type 2 diabetes mellitus with other circulatory complications: Secondary | ICD-10-CM | POA: Diagnosis present

## 2022-03-18 DIAGNOSIS — Z91148 Patient's other noncompliance with medication regimen for other reason: Secondary | ICD-10-CM

## 2022-03-18 DIAGNOSIS — Z886 Allergy status to analgesic agent status: Secondary | ICD-10-CM

## 2022-03-18 LAB — COMPREHENSIVE METABOLIC PANEL
ALT: 25 U/L (ref 0–44)
AST: 27 U/L (ref 15–41)
Albumin: 6 g/dL — ABNORMAL HIGH (ref 3.5–5.0)
Alkaline Phosphatase: 103 U/L (ref 38–126)
Anion gap: 22 — ABNORMAL HIGH (ref 5–15)
BUN: 53 mg/dL — ABNORMAL HIGH (ref 6–20)
CO2: 16 mmol/L — ABNORMAL LOW (ref 22–32)
Calcium: 9.9 mg/dL (ref 8.9–10.3)
Chloride: 88 mmol/L — ABNORMAL LOW (ref 98–111)
Creatinine, Ser: 2.27 mg/dL — ABNORMAL HIGH (ref 0.61–1.24)
GFR, Estimated: 33 mL/min — ABNORMAL LOW (ref >60)
Glucose, Bld: 490 mg/dL — ABNORMAL HIGH (ref 70–99)
Potassium: 5.4 mmol/L — ABNORMAL HIGH (ref 3.5–5.1)
Sodium: 126 mmol/L — ABNORMAL LOW (ref 135–145)
Total Bilirubin: 1.2 mg/dL (ref 0.3–1.2)
Total Protein: 10.7 g/dL — ABNORMAL HIGH (ref 6.5–8.1)

## 2022-03-18 LAB — CBG MONITORING, ED
Glucose-Capillary: 475 mg/dL — ABNORMAL HIGH (ref 70–99)
Glucose-Capillary: 433 mg/dL — ABNORMAL HIGH (ref 70–99)
Glucose-Capillary: 334 mg/dL — ABNORMAL HIGH (ref 70–99)

## 2022-03-18 LAB — BASIC METABOLIC PANEL
Anion gap: 15 (ref 5–15)
Anion gap: 14 (ref 5–15)
Anion gap: 15 (ref 5–15)
BUN: 26 mg/dL — ABNORMAL HIGH (ref 6–20)
BUN: 45 mg/dL — ABNORMAL HIGH (ref 6–20)
BUN: 42 mg/dL — ABNORMAL HIGH (ref 6–20)
CO2: 9 mmol/L — ABNORMAL LOW (ref 22–32)
CO2: 24 mmol/L (ref 22–32)
CO2: 21 mmol/L — ABNORMAL LOW (ref 22–32)
Calcium: 6.5 mg/dL — ABNORMAL LOW (ref 8.9–10.3)
Calcium: 9.8 mg/dL (ref 8.9–10.3)
Calcium: 9.8 mg/dL (ref 8.9–10.3)
Chloride: 111 mmol/L (ref 98–111)
Chloride: 96 mmol/L — ABNORMAL LOW (ref 98–111)
Chloride: 97 mmol/L — ABNORMAL LOW (ref 98–111)
Creatinine, Ser: 1.01 mg/dL (ref 0.61–1.24)
Creatinine, Ser: 2.31 mg/dL — ABNORMAL HIGH (ref 0.61–1.24)
Creatinine, Ser: 1.99 mg/dL — ABNORMAL HIGH (ref 0.61–1.24)
GFR, Estimated: >60 mL/min (ref >60)
GFR, Estimated: 33 mL/min — ABNORMAL LOW (ref >60)
GFR, Estimated: 39 mL/min — ABNORMAL LOW (ref >60)
Glucose, Bld: 52 mg/dL — ABNORMAL LOW (ref 70–99)
Glucose, Bld: 137 mg/dL — ABNORMAL HIGH (ref 70–99)
Glucose, Bld: 147 mg/dL — ABNORMAL HIGH (ref 70–99)
Potassium: 3.5 mmol/L (ref 3.5–5.1)
Potassium: 3.7 mmol/L (ref 3.5–5.1)
Potassium: 3.5 mmol/L (ref 3.5–5.1)
Sodium: 135 mmol/L (ref 135–145)
Sodium: 134 mmol/L — ABNORMAL LOW (ref 135–145)
Sodium: 133 mmol/L — ABNORMAL LOW (ref 135–145)

## 2022-03-18 LAB — LACTIC ACID, PLASMA
Lactic Acid, Venous: 2.3 mmol/L (ref 0.5–1.9)
Lactic Acid, Venous: 1.8 mmol/L (ref 0.5–1.9)

## 2022-03-18 LAB — CBC WITH DIFFERENTIAL/PLATELET
Abs Immature Granulocytes: 0.06 10*3/uL (ref 0.00–0.07)
Basophils Absolute: 0 10*3/uL (ref 0.0–0.1)
Basophils Relative: 0 %
Eosinophils Absolute: 0 10*3/uL (ref 0.0–0.5)
Eosinophils Relative: 0 %
HCT: 46.1 % (ref 39.0–52.0)
Hemoglobin: 15.4 g/dL (ref 13.0–17.0)
Immature Granulocytes: 0 %
Lymphocytes Relative: 7 %
Lymphs Abs: 1 10*3/uL (ref 0.7–4.0)
MCH: 30.9 pg (ref 26.0–34.0)
MCHC: 33.4 g/dL (ref 30.0–36.0)
MCV: 92.6 fL (ref 80.0–100.0)
Monocytes Absolute: 0.4 10*3/uL (ref 0.1–1.0)
Monocytes Relative: 3 %
Neutro Abs: 12.2 10*3/uL — ABNORMAL HIGH (ref 1.7–7.7)
Neutrophils Relative %: 90 %
Platelets: 433 10*3/uL — ABNORMAL HIGH (ref 150–400)
RBC: 4.98 MIL/uL (ref 4.22–5.81)
RDW: 12.6 % (ref 11.5–15.5)
WBC: 13.7 10*3/uL — ABNORMAL HIGH (ref 4.0–10.5)
nRBC: 0 % (ref 0.0–0.2)

## 2022-03-18 LAB — LIPASE, BLOOD: Lipase: 65 U/L — ABNORMAL HIGH (ref 11–51)

## 2022-03-18 LAB — GLUCOSE, CAPILLARY
Glucose-Capillary: 136 mg/dL — ABNORMAL HIGH (ref 70–99)
Glucose-Capillary: 124 mg/dL — ABNORMAL HIGH (ref 70–99)
Glucose-Capillary: 186 mg/dL — ABNORMAL HIGH (ref 70–99)
Glucose-Capillary: 161 mg/dL — ABNORMAL HIGH (ref 70–99)
Glucose-Capillary: 136 mg/dL — ABNORMAL HIGH (ref 70–99)
Glucose-Capillary: 142 mg/dL — ABNORMAL HIGH (ref 70–99)
Glucose-Capillary: 155 mg/dL — ABNORMAL HIGH (ref 70–99)
Glucose-Capillary: 160 mg/dL — ABNORMAL HIGH (ref 70–99)

## 2022-03-18 LAB — URINALYSIS, ROUTINE W REFLEX MICROSCOPIC
Bilirubin Urine: NEGATIVE
Glucose, UA: NEGATIVE mg/dL
Hgb urine dipstick: NEGATIVE
Ketones, ur: NEGATIVE mg/dL
Leukocytes,Ua: NEGATIVE
Nitrite: NEGATIVE
Protein, ur: NEGATIVE mg/dL
Specific Gravity, Urine: 1.014 (ref 1.005–1.030)
pH: 8 (ref 5.0–8.0)

## 2022-03-18 LAB — BLOOD GAS, VENOUS
Acid-base deficit: 3.9 mmol/L — ABNORMAL HIGH (ref 0.0–2.0)
Bicarbonate: 19.2 mmol/L — ABNORMAL LOW (ref 20.0–28.0)
O2 Saturation: 33.3 %
Patient temperature: 37
pCO2, Ven: 29 mmHg — ABNORMAL LOW (ref 44–60)
pH, Ven: 7.43 (ref 7.25–7.43)
pO2, Ven: <31 mmHg — CL (ref 32–45)

## 2022-03-18 LAB — BETA-HYDROXYBUTYRIC ACID
Beta-Hydroxybutyric Acid: 5.58 mmol/L — ABNORMAL HIGH (ref 0.05–0.27)
Beta-Hydroxybutyric Acid: 0.44 mmol/L — ABNORMAL HIGH (ref 0.05–0.27)

## 2022-03-18 LAB — MRSA NEXT GEN BY PCR, NASAL: MRSA by PCR Next Gen: NOT DETECTED

## 2022-03-18 MED ORDER — PANTOPRAZOLE SODIUM 40 MG IV SOLR
40.0000 mg | Freq: Two times a day (BID) | INTRAVENOUS | Status: DC
  Administered 2022-03-18 – 2022-03-19 (×3): 40 mg via INTRAVENOUS
  Filled 2022-03-18 (×3): qty 10

## 2022-03-18 MED ORDER — FOLIC ACID 1 MG PO TABS
1.0000 mg | ORAL_TABLET | Freq: Every day | ORAL | Status: DC
  Administered 2022-03-19 – 2022-03-22 (×4): 1 mg via ORAL
  Filled 2022-03-18 (×4): qty 1

## 2022-03-18 MED ORDER — LORAZEPAM 1 MG PO TABS
1.0000 mg | ORAL_TABLET | ORAL | Status: AC | PRN
  Administered 2022-03-19: 2 mg via ORAL
  Administered 2022-03-20: 1 mg via ORAL
  Filled 2022-03-18: qty 2
  Filled 2022-03-18: qty 1

## 2022-03-18 MED ORDER — IOHEXOL 300 MG/ML  SOLN
80.0000 mL | Freq: Once | INTRAMUSCULAR | Status: AC | PRN
  Administered 2022-03-18: 80 mL via INTRAVENOUS

## 2022-03-18 MED ORDER — ONDANSETRON HCL 4 MG/2ML IJ SOLN
4.0000 mg | Freq: Four times a day (QID) | INTRAMUSCULAR | Status: DC | PRN
  Administered 2022-03-18: 4 mg via INTRAVENOUS
  Filled 2022-03-18: qty 2

## 2022-03-18 MED ORDER — LACTATED RINGERS IV BOLUS
1000.0000 mL | Freq: Once | INTRAVENOUS | Status: AC
  Administered 2022-03-18: 1000 mL via INTRAVENOUS

## 2022-03-18 MED ORDER — LACTATED RINGERS IV BOLUS
500.0000 mL | Freq: Once | INTRAVENOUS | Status: AC
  Administered 2022-03-18: 500 mL via INTRAVENOUS

## 2022-03-18 MED ORDER — LORAZEPAM 2 MG/ML IJ SOLN
0.0000 mg | Freq: Four times a day (QID) | INTRAMUSCULAR | Status: AC
  Administered 2022-03-18: 2 mg via INTRAVENOUS
  Administered 2022-03-19: 1 mg via INTRAVENOUS
  Filled 2022-03-18 (×2): qty 1

## 2022-03-18 MED ORDER — MORPHINE SULFATE (PF) 4 MG/ML IV SOLN
4.0000 mg | Freq: Once | INTRAVENOUS | Status: AC
  Administered 2022-03-18: 4 mg via INTRAVENOUS
  Filled 2022-03-18: qty 1

## 2022-03-18 MED ORDER — ADULT MULTIVITAMIN W/MINERALS CH
1.0000 | ORAL_TABLET | Freq: Every day | ORAL | Status: DC
  Administered 2022-03-19 – 2022-03-22 (×4): 1 via ORAL
  Filled 2022-03-18 (×4): qty 1

## 2022-03-18 MED ORDER — THIAMINE MONONITRATE 100 MG PO TABS
100.0000 mg | ORAL_TABLET | Freq: Every day | ORAL | Status: DC
  Administered 2022-03-20 – 2022-03-22 (×3): 100 mg via ORAL
  Filled 2022-03-18 (×4): qty 1

## 2022-03-18 MED ORDER — MORPHINE SULFATE (PF) 2 MG/ML IV SOLN
2.0000 mg | INTRAVENOUS | Status: DC | PRN
  Administered 2022-03-18: 2 mg via INTRAVENOUS
  Filled 2022-03-18: qty 1

## 2022-03-18 MED ORDER — LORAZEPAM 2 MG/ML IJ SOLN
0.0000 mg | Freq: Two times a day (BID) | INTRAMUSCULAR | Status: DC
  Administered 2022-03-21 – 2022-03-22 (×3): 2 mg via INTRAVENOUS
  Filled 2022-03-18 (×3): qty 1

## 2022-03-18 MED ORDER — INSULIN REGULAR(HUMAN) IN NACL 100-0.9 UT/100ML-% IV SOLN
INTRAVENOUS | Status: DC
  Administered 2022-03-18: 10.5 [IU]/h via INTRAVENOUS
  Filled 2022-03-18: qty 100

## 2022-03-18 MED ORDER — DEXTROSE 50 % IV SOLN: 0.0000 mL | INTRAVENOUS | Status: DC | PRN

## 2022-03-18 MED ORDER — DIPHENHYDRAMINE HCL 25 MG PO CAPS
25.0000 mg | ORAL_CAPSULE | Freq: Four times a day (QID) | ORAL | Status: DC | PRN
  Administered 2022-03-18 – 2022-03-21 (×4): 25 mg via ORAL
  Filled 2022-03-18 (×4): qty 1

## 2022-03-18 MED ORDER — METOCLOPRAMIDE HCL 5 MG/ML IJ SOLN
10.0000 mg | Freq: Once | INTRAMUSCULAR | Status: AC
  Administered 2022-03-18: 10 mg via INTRAVENOUS
  Filled 2022-03-18: qty 2

## 2022-03-18 MED ORDER — CHLORHEXIDINE GLUCONATE CLOTH 2 % EX PADS
6.0000 | MEDICATED_PAD | Freq: Every day | CUTANEOUS | Status: DC
  Administered 2022-03-18 – 2022-03-19 (×2): 6 via TOPICAL

## 2022-03-18 MED ORDER — DEXTROSE IN LACTATED RINGERS 5 % IV SOLN: INTRAVENOUS | Status: DC

## 2022-03-18 MED ORDER — LORAZEPAM 2 MG/ML IJ SOLN
1.0000 mg | INTRAMUSCULAR | Status: AC | PRN
  Administered 2022-03-20 – 2022-03-21 (×2): 2 mg via INTRAVENOUS
  Filled 2022-03-18 (×2): qty 1

## 2022-03-18 MED ORDER — THIAMINE HCL 100 MG/ML IJ SOLN
100.0000 mg | Freq: Every day | INTRAMUSCULAR | Status: DC
  Administered 2022-03-19: 100 mg via INTRAVENOUS
  Filled 2022-03-18: qty 2

## 2022-03-18 MED ORDER — PROCHLORPERAZINE EDISYLATE 10 MG/2ML IJ SOLN
10.0000 mg | Freq: Once | INTRAMUSCULAR | Status: AC
  Administered 2022-03-18: 10 mg via INTRAVENOUS
  Filled 2022-03-18: qty 2

## 2022-03-18 MED ORDER — LACTATED RINGERS IV SOLN: INTRAVENOUS | Status: DC

## 2022-03-18 NOTE — Assessment & Plan Note (Addendum)
With patient's frequent bouts of nausea and vomiting over the past several days prior to admission, patient reports that several of these episodes were mixed with what sounds to be coffee-ground emesis Normal hemoglobin and hematocrit while here however these numbers are likely hemoconcentrated Suspect some degree of Mallory-Weiss tear eventually resulting in several bouts of coffee-ground emesis Patient with no further bouts of coffee-ground emesis. Patient maintained on a PPI.   Hemoglobin remained stable throughout the hospitalization/send by day of discharge hemoglobin was 9.8.   Outpatient follow-up.

## 2022-03-18 NOTE — Progress Notes (Signed)
Vomiting continues intermittently despite IV Zofran. Notified Dr Cyd Silence and new order for Compazine . See Emar.  Will hold off on taking him for his CT scan abdomen until nausea/vomiting is under control .

## 2022-03-18 NOTE — Assessment & Plan Note (Addendum)
Acute kidney injury superimposed on chronic kidney disease stage IIIa due to volume depletion. Urinalysis nitrite negative, leukocytes negative, negative protein. Renal function improved with hydration.   Patient noted to have been on ACE inhibitor prior to admission which was subsequently held during the hospitalization due to soft blood pressure and will not be resumed on discharge until follow-up with PCP.

## 2022-03-18 NOTE — Assessment & Plan Note (Addendum)
Obtaining exact information from patient as to how much alcohol he actually drinks is difficult Initially, patient stated to admitting MD that he "only drinks socially" but then when asked again he reports drinking a pint of vodka daily in the days preceding his illness No significant withdrawal noted during this hospitalization. Patient placed on the Ativan withdrawal protocol.   -Patient had no significant withdrawal symptoms during the hospitalization.   Alcohol cessation stressed to patient.

## 2022-03-18 NOTE — Assessment & Plan Note (Signed)
Notable lactic acidosis on chemistry likely secondary to volume depletion Improved with aggressive hydration.

## 2022-03-18 NOTE — ED Provider Notes (Signed)
[place] EMERGENCY DEPARTMENT AT [facility] Provider Note   CSN: [id number] Arrival date & time: 03/18/22  [id number]     History  Chief Complaint  Patient presents with   Hyperglycemia   Nausea    James Bradford is a 57 y.o. male.  Patient is a 57 year old male with a history of hypertension, diabetes, chronic kidney disease who is presenting today due to abdominal pain, nausea vomiting that has been persistent for the last 2 days.  Patient reports however for the last 2 months he has been out of his oral medications including diabetic medications and blood pressure medicines.  He has been intermittently using the insulin that he had but has had a waxing and waning abdominal discomfort.  However the last 2 days is when the vomiting started and he has been unable to hold anything down.  The pain is used in his abdomen.  He denies any hematemesis or diarrhea.  No fevers cough or shortness of breath.  He did take 10 units of insulin last night but reports it did not make him feel any better.  The history is provided by the patient and medical records.  Hyperglycemia      Home Medications Prior to Admission medications   Medication Sig Start Date End Date Taking? Authorizing Provider  Accu-Chek Softclix Lancets lancets Use as instructed 04/24/19   Caron Presume, Angelyn Punt, MD  blood glucose meter kit and supplies KIT Dispense based on patient and insurance preference. Use up to four times daily as directed. 09/10/21   Oswald Hillock, MD  dicyclomine (BENTYL) 20 MG tablet Take 1 tablet (20 mg total) by mouth 2 (two) times daily. 12/25/19   Faustino Congress, NP  glipiZIDE (GLUCOTROL) 10 MG tablet Take 10 mg by mouth 2 (two) times daily before a meal. 10/24/21   [provider]  glucose blood (ACCU-CHEK GUIDE) test strip Use as instructed 04/24/19   Concepcion Living, MD  hydrALAZINE (APRESOLINE) 25 MG tablet Take 1 tablet (25 mg total) by mouth every 8 (eight) hours. 12/13/21  01/12/22  Alma Friendly, MD  Insulin Glargine (BASAGLAR KWIKPEN) 100 UNIT/ML Inject 10 Units into the skin daily. 09/10/21   Oswald Hillock, MD  Insulin Pen Needle 32G X 4 MM MISC Use with Insulin pen 09/10/21   Oswald Hillock, MD  KRILL OIL PO Take 1 capsule by mouth daily.    [provider]  metFORMIN (GLUCOPHAGE) 500 MG tablet Take 1 tablet (500 mg total) by mouth 2 (two) times daily with a meal. 09/10/21 01/08/22  Oswald Hillock, MD  pantoprazole (PROTONIX) 40 MG tablet Take 1 tablet (40 mg total) by mouth 2 (two) times daily before a meal. 12/13/21 01/12/22  Alma Friendly, MD  pravastatin (PRAVACHOL) 10 MG tablet Take 1 tablet (10 mg total) by mouth daily. 04/24/19   Concepcion Living, MD  sildenafil (VIAGRA) 100 MG tablet Take 100 mg by mouth daily as needed for erectile dysfunction. 12/21/19   [provider]  sucralfate (CARAFATE) 1 g tablet Take 1 tablet (1 g total) by mouth 4 (four) times daily for 10 days. 12/13/21 12/23/21  Alma Friendly, MD      Allergies    Ibuprofen, Pork-derived products, and Aspirin    Review of Systems   Review of Systems  Physical Exam Updated Vital Signs BP (!) 158/97 (BP Location: Right Arm)   Pulse 100   Temp 98.5 F (36.9 C) (Oral)   Resp  18   Ht 5' 7"$  (1.702 m)   Wt 70.3 kg   SpO2 98%   BMI 24.28 kg/m  Physical Exam Vitals and nursing note reviewed.  Constitutional:      General: He is not in acute distress.    Appearance: He is well-developed.     Comments: Patient smells of ketones  HENT:     Head: Normocephalic and atraumatic.     Mouth/Throat:     Mouth: Mucous membranes are dry.  Eyes:     Conjunctiva/sclera: Conjunctivae normal.     Pupils: Pupils are equal, round, and reactive to light.  Cardiovascular:     Rate and Rhythm: Regular rhythm. Tachycardia present.     Heart sounds: No murmur heard. Pulmonary:     Effort: Pulmonary effort is normal. No respiratory distress.     Breath sounds: Normal  breath sounds. No wheezing or rales.  Abdominal:     General: There is no distension.     Palpations: Abdomen is soft.     Tenderness: There is abdominal tenderness in the epigastric area. There is no guarding or rebound.  Musculoskeletal:        General: No tenderness. Normal range of motion.     Cervical back: Normal range of motion and neck supple.  Skin:    General: Skin is warm and dry.     Findings: No erythema or rash.  Neurological:     Mental Status: He is alert and oriented to person, place, and time. Mental status is at baseline.  Psychiatric:        Mood and Affect: Mood normal.        Behavior: Behavior normal.     ED Results / Procedures / Treatments   Labs (all labs ordered are listed, but only abnormal results are displayed) Labs Reviewed  CBC WITH DIFFERENTIAL/PLATELET - Abnormal; Notable for the following components:      Result Value   WBC 13.7 (*)    Platelets 433 (*)    Neutro Abs 12.2 (*)    All other components within normal limits  COMPREHENSIVE METABOLIC PANEL - Abnormal; Notable for the following components:   Sodium 126 (*)    Potassium 5.4 (*)    Chloride 88 (*)    CO2 16 (*)    Glucose, Bld 490 (*)    BUN 53 (*)    Creatinine, Ser 2.27 (*)    Total Protein 10.7 (*)    Albumin 6.0 (*)    GFR, Estimated 33 (*)    Anion gap 22 (*)    All other components within normal limits  LIPASE, BLOOD - Abnormal; Notable for the following components:   Lipase 65 (*)    All other components within normal limits  LACTIC ACID, PLASMA - Abnormal; Notable for the following components:   Lactic Acid, Venous 2.3 (*)    All other components within normal limits  BLOOD GAS, VENOUS - Abnormal; Notable for the following components:   pCO2, Ven 29 (*)    pO2, Ven <31 (*)    Bicarbonate 19.2 (*)    Acid-base deficit 3.9 (*)    All other components within normal limits  CBG MONITORING, ED - Abnormal; Notable for the following components:   Glucose-Capillary 475  (*)    All other components within normal limits  LACTIC ACID, PLASMA  BASIC METABOLIC PANEL  BASIC METABOLIC PANEL  BASIC METABOLIC PANEL  BETA-HYDROXYBUTYRIC ACID  BETA-HYDROXYBUTYRIC ACID  URINALYSIS, ROUTINE W REFLEX  MICROSCOPIC    EKG None  Radiology No results found.  Procedures Procedures    Medications Ordered in ED Medications  insulin regular, human (MYXREDLIN) 100 units/ 100 mL infusion (has no administration in time range)  lactated ringers infusion (has no administration in time range)  dextrose 5 % in lactated ringers infusion (has no administration in time range)  dextrose 50 % solution 0-50 mL (has no administration in time range)  lactated ringers bolus 500 mL (has no administration in time range)  metoCLOPramide (REGLAN) injection 10 mg (10 mg Intravenous Given 03/18/22 1054)  morphine (PF) 4 MG/ML injection 4 mg (4 mg Intravenous Given 03/18/22 1054)  lactated ringers bolus 1,000 mL (1,000 mLs Intravenous New Bag/Given 03/18/22 1054)    ED Course/ Medical Decision Making/ A&P                             Medical Decision Making Amount and/or Complexity of Data Reviewed External Data Reviewed: notes. Labs: ordered. Decision-making details documented in ED Course.  Risk Prescription drug management. Decision regarding hospitalization.   Pt with multiple medical problems and comorbidities and presenting today with a complaint that caries a high risk for morbidity and mortality.  Here today after running out of his medications over the last 2 months now with nausea vomiting and abdominal pain.  Concern for DKA versus hypertensive urgency versus worsening renal function versus electrolyte abnormality and dehydration.  Patient's blood sugar is in the 400s here and that after using some insulin last night that he still had leftover.  He has no focal abdominal tenderness concerning for appendicitis, diverticulitis or cholecystitis.  Patient given IV fluids pain  and nausea control with improvement of blood pressure.  Labs are pending.  11:59 AM I independently interpreted patient's labs today and lactate is elevated 2.3, VBG shows compensated metabolic acidosis, lipase is mildly elevated at 65, CMP today with new AKI with creatinine of 2.27 from his baseline of 1.5, hyponatremia and hypochloremia with an anion gap of 22 and a blood sugar of 490, CBC with mild leukocytosis of 13.  Patient started on the Endo tool.  Will hold potassium at this time as his level is 5.4.  Will get every 4 BMPs and beta hydroxybutyric acid.  Will admit to the hospitalist for further care. CRITICAL CARE Performed by: Mersedes Alber Total critical care time: 30 minutes Critical care time was exclusive of separately billable procedures and treating other patients. Critical care was necessary to treat or prevent imminent or life-threatening deterioration. Critical care was time spent personally by me on the following activities: development of treatment plan with patient and/or surrogate as well as nursing, discussions with consultants, evaluation of patient's response to treatment, examination of patient, obtaining history from patient or surrogate, ordering and performing treatments and interventions, ordering and review of laboratory studies, ordering and review of radiographic studies, pulse oximetry and re-evaluation of patient's condition.          Final Clinical Impression(s) / ED Diagnoses Final diagnoses:  Diabetic ketoacidosis without coma associated with type 2 diabetes mellitus ([place])  AKI (acute kidney injury) ([place])    Rx / DC Orders ED Discharge Orders     None         Blanchie Dessert, MD 03/18/22 1159

## 2022-03-18 NOTE — Assessment & Plan Note (Addendum)
Patient presented with severe generalized abdominal pain accompanied by multiple bouts of vomiting Lipase mildly elevated, liver enzymes unremarkable, urinalysis unremarkable Abdominal pain improved with management of DKA.  CT abdomen and pelvis with no acute intra-abdominal process, mild thickening of distal esophagus, possible esophagitis.  Patient maintained on PPI. Patient improved clinically and be discharged home in stable and improved condition.

## 2022-03-18 NOTE — H&P (Signed)
History and Physical    Patient: James Bradford MRN: [id number] DOA: 03/18/2022  Date of Service: the patient was seen and examined on 03/18/2022  Patient coming from: Home  Chief Complaint:  Chief Complaint  Patient presents with   Hyperglycemia   Nausea    HPI:   57 year old male with past medical history of hypertension, hyperlipidemia, marijuana use, alcohol abuse, diabetes mellitus type 2 who presented to [facility] emergency department with complaints of nausea vomiting and abdominal pain.  Patient claims that approximately 2 to 3 days ago he has been experiencing abdominal pain.  Abdominal pain is moderate to severe in intensity, generalized in location, sharp in quality and radiating diffusely.  Abdominal pain has been associated with intense nausea and frequent bouts of vomiting and inability to tolerate oral intake.  Patient describes his vomitus as occasionally coffee-ground in appearance.  Patient denies any associated melena.  Patient denies fevers, sick contacts, recent travel or recent ingestion of undercooked food.  Upon further questioning patient additionally admits that for the past 2 months he has been out of his medications including his diabetic medications.  Due to progressively worsening symptoms patient eventually presented to [facility] emergency department for evaluation.  Upon evaluation in the emergency department patient was found to have acute kidney injury with creatinine of 2.27, severe hyperglycemia with blood sugar 490 and elevated anion gap of 22 all concerning for diabetic ketoacidosis with acute kidney injury.  Patient was additionally found to have a lactic acidosis.  Patient was initiated on intravenous fluids and insulin infusion and the hospitalist group was then called to assess the patient for admission to the hospital.  Review of Systems: Review of Systems  Constitutional:  Positive for malaise/fatigue.  Gastrointestinal:   Positive for abdominal pain, nausea and vomiting.  Neurological:  Positive for weakness.  All other systems reviewed and are negative.    Past Medical History:  Diagnosis Date   Aortic atherosclerosis ([place]) 08/24/2020   Hyperlipidemia    Hypertension    Marijuana abuse 09/28/2020   Sinus bradycardia    Stage 3 chronic kidney disease ([place])    Type 2 diabetes mellitus ([place])    Unspecified glaucoma 02/18/2008   Qualifier: Diagnosis of  By: Hassell Done FNP, Tori Milks      Past Surgical History:  Procedure Laterality Date   HAND SURGERY     LEG SURGERY      Social History:  reports that he has never smoked. He has never used smokeless tobacco. He reports current drug use. Drug: Marijuana. He reports that he does not drink alcohol.  Allergies  Allergen Reactions   Beef-Derived Products    Ibuprofen Other (See Comments)    Hurts his stomach    Pork-Derived Products     Religious beliefs   Aspirin Other (See Comments)    Stomach irritation    Family History  Problem Relation Age of Onset   Hypertension Mother    Pancreatic cancer Father     Prior to Admission medications   Medication Sig Start Date End Date Taking? Authorizing Provider  Accu-Chek Softclix Lancets lancets Use as instructed 04/24/19   Caron Presume, Angelyn Punt, MD  blood glucose meter kit and supplies KIT Dispense based on patient and insurance preference. Use up to four times daily as directed. 09/10/21   Oswald Hillock, MD  dicyclomine (BENTYL) 20 MG tablet Take 1 tablet (20 mg total) by mouth 2 (two) times daily. 12/25/19   Faustino Congress, NP  glipiZIDE (GLUCOTROL) 10 MG tablet Take 10 mg by mouth 2 (two) times daily before a meal. 10/24/21   [provider]  glucose blood (ACCU-CHEK GUIDE) test strip Use as instructed 04/24/19   Concepcion Living, MD  hydrALAZINE (APRESOLINE) 25 MG tablet Take 1 tablet (25 mg total) by mouth every 8 (eight) hours. 12/13/21 01/12/22  Alma Friendly, MD  Insulin Glargine  (BASAGLAR KWIKPEN) 100 UNIT/ML Inject 10 Units into the skin daily. Patient not taking: Reported on 03/18/2022 09/10/21   Oswald Hillock, MD  Insulin Pen Needle 32G X 4 MM MISC Use with Insulin pen 09/10/21   Oswald Hillock, MD  KRILL OIL PO Take 1 capsule by mouth daily.    [provider]  metFORMIN (GLUCOPHAGE) 500 MG tablet Take 1 tablet (500 mg total) by mouth 2 (two) times daily with a meal. 09/10/21 01/08/22  Oswald Hillock, MD  pantoprazole (PROTONIX) 40 MG tablet Take 1 tablet (40 mg total) by mouth 2 (two) times daily before a meal. 12/13/21 01/12/22  Alma Friendly, MD  pravastatin (PRAVACHOL) 10 MG tablet Take 1 tablet (10 mg total) by mouth daily. Patient not taking: Reported on 03/18/2022 04/24/19   Concepcion Living, MD  sucralfate (CARAFATE) 1 g tablet Take 1 tablet (1 g total) by mouth 4 (four) times daily for 10 days. 12/13/21 12/23/21  Alma Friendly, MD    Physical Exam:  Vitals:   03/18/22 1700 03/18/22 1800 03/18/22 1947 03/18/22 2000  BP: (!) 142/89 (!) 146/99 (!) 136/93   Pulse: (!) 103 (!) 111 (!) 108   Resp: 13 14 16   $ Temp:    98.5 F (36.9 C)  TempSrc:    Oral  SpO2: 97% 98% 100%   Weight:      Height:        Constitutional: Awake alert and oriented x3, patient is in distress due to abdominal pain and nausea Skin: no rashes, no lesions, poor skin turgor noted. Eyes: Pupils are equally reactive to light.  No evidence of scleral icterus or conjunctival pallor.  ENMT: Dry mucous membranes noted.  Posterior pharynx clear of any exudate or lesions.   Neck: normal, supple, no masses, no thyromegaly.  No evidence of jugular venous distension.   Respiratory: clear to auscultation bilaterally, no wheezing, no crackles. Normal respiratory effort. No accessory muscle use.  Cardiovascular: Tachycardic rate with regular rhythm no murmurs / rubs / gallops. No extremity edema. 2+ pedal pulses. No carotid bruits.  Chest:   Nontender without crepitus or deformity.    Back:   Nontender without crepitus or deformity. Abdomen: Notable generalized abdominal tenderness.  Abdomen is soft.  No evidence of intra-abdominal masses.  Positive bowel sounds noted in all quadrants.   Musculoskeletal: No joint deformity upper and lower extremities. Good ROM, no contractures. Normal muscle tone.  Neurologic: CN 2-12 grossly intact. Sensation intact.  Patient moving all 4 extremities spontaneously.  Patient is following all commands.  Patient is responsive to verbal stimuli.   Psychiatric: Patient exhibits angry mood with appropriate affect.  Patient seems to possess insight as to their current situation.    Data Reviewed:  I have personally reviewed and interpreted labs, imaging.  Significant findings are   Lab Results  Component Value Date   WBC 13.7 (H) 03/18/2022   HGB 15.4 03/18/2022   HCT 46.1 03/18/2022   MCV 92.6 03/18/2022   PLT 433 (H) 03/18/2022   Lab Results  Component Value Date  K 3.7 03/18/2022   Lab Results  Component Value Date   BUN 45 (H) 03/18/2022   Lab Results  Component Value Date   CREATININE 2.31 (H) 03/18/2022    CXR:   Chest X-ray was personally reviewed.  No evidence of focal infiltrates.  No evidence of pleural effusion.  No evidence of pneumothorax.    Telemetry: Personally reviewed.  Rhythm is tachycardia with heart rate of 110 bpm.  No dynamic ST segment changes appreciated.    Assessment and Plan: * Diabetic ketoacidosis without coma associated with type 2 diabetes mellitus ([place]) Likely secondary to 2 months of noncompliance with medications Admission to stepdown unit Continue insulin infusion Aggressive intravenous volume resuscitation Serial chemistries and beta hydroxybutyrate levels Will initially make n.p.o. and advance diet once gap closes Evaluate for any evidence of contributing infection with chest x-ray and urinalysis and CT imaging of the abdomen and pelvis  Generalized abdominal pain Patient  presenting with severe generalized abdominal pain accompanied by multiple bouts of vomiting Lipase mildly elevated, liver enzymes unremarkable, urinalysis unremarkable Abdomen diffusely tender on exam Will obtain CT imaging of the abdomen pelvis to evaluate further Review of previous hospitalization reveals patient had similar abdominal pain before  Lactic acidosis Notable lactic acidosis on chemistry likely secondary to volume depletion Hydrating patient aggressively with intravenous isotonic fluids Monitoring lactic acid levels to ensure downtrending and resolution  Coffee ground emesis With patient's frequent bouts of nausea and vomiting over the past several days patient reports that several of these episodes were mixed with what sounds to be coffee-ground emesis Normal hemoglobin and hematocrit while here however these numbers are likely hemoconcentrated Suspect some degree of Mallory-Weiss tear eventually resulting in several bouts of coffee-ground emesis Will place patient on Protonix 40 g IV twice daily for now Monitoring hemoglobin and hematocrit with serial CBCs  Alcohol abuse Obtaining exact information from patient as to how much alcohol he actually drinks is difficult Initially, patient states that he "only drinks socially" but then when asked again he reports drinking a pint of vodka daily in the days preceding his illness Placing patient on CIWA protocol Counseling on alcohol cessation  Acute renal failure superimposed on stage 3a chronic kidney disease ([place]) Acute kidney injury superimposed on chronic kidney disease stage IIIa due to volume depletion Hydrating patient with intravenous isotonic fluids. Strict input and output monitoring Monitoring renal function and electrolytes with serial chemistries Avoiding nephrotoxic agents if at all possible   Essential hypertension Currently normotensive without antihypertensives Will manage conservatively, provide patient  with as needed intravenous antihypertensives for markedly elevated blood pressures.   Code Status:  Full code  code status decision has been confirmed with: patient Family Communication: deferred   Consults: none  Severity of Illness:  The appropriate patient status for this patient is INPATIENT. Inpatient status is judged to be reasonable and necessary in order to provide the required intensity of service to ensure the patient's safety. The patient's presenting symptoms, physical exam findings, and initial radiographic and laboratory data in the context of their chronic comorbidities is felt to place them at high risk for further clinical deterioration. Furthermore, it is not anticipated that the patient will be medically stable for discharge from the hospital within 2 midnights of admission.   * I certify that at the point of admission it is my clinical judgment that the patient will require inpatient hospital care spanning beyond 2 midnights from the point of admission due to high intensity of service, high  risk for further deterioration and high frequency of surveillance required.*  Author:  Vernelle Emerald MD  03/18/2022 10:00 PM

## 2022-03-18 NOTE — Assessment & Plan Note (Addendum)
Soft blood pressure improved.   Continue IV fluids for another 24 hours.

## 2022-03-18 NOTE — ED Triage Notes (Signed)
Pt complaining of N/V abd pain for 2x days. Elevated blood sugar noted by EMS.

## 2022-03-18 NOTE — Assessment & Plan Note (Addendum)
Likely secondary to 2 months of noncompliance with medications Urinalysis unremarkable, bland. Chest x-ray negative for any infiltrate.   Patient remained afebrile. Patient with no cardiac symptoms. Patient hydrated with IV fluids.  Anion gap closed, acidosis resolved. Beta hydroxybutyric acid trended down. Hemoglobin A1c 9.9. Compliance stressed to patient. Patient was transitioned from insulin drip to subcutaneous insulin, Semglee 10 units daily, SSI. CBG improved and controlled on long-acting Semglee 10 units daily, as well as SSI.   Tolerating carb modified diet.   Diabetic coordinator consulted and following.  Patient will be discharged home on Tresiba 10 units daily, metformin 1000 mg twice daily, glipizide 10 mg twice daily. Outpatient follow-up with PCP.

## 2022-03-18 NOTE — ED Notes (Signed)
ED TO INPATIENT HANDOFF REPORT  Name/Age/Gender James Bradford 57 y.o. male  Code Status Code Status History     Date Active Date Inactive Code Status Order ID Comments User Context   12/09/2021 1612 12/13/2021 1915 Full Code [id number]  Lavina Hamman, MD ED   09/09/2021 1639 09/10/2021 2047 DNR [id number]  Jonnie Finner, DO ED   01/12/2021 2130 01/16/2021 1623 Full Code [id number]  Shela Leff, MD ED   09/28/2020 0923 09/30/2020 0542 Full Code [id number]  Karmen Bongo, MD ED   08/23/2020 2021 08/24/2020 1943 Full Code [id number]  Reubin Milan, MD ED   04/23/2019 1453 04/24/2019 2041 DNR [id number]  Sherene Sires, DO ED   04/23/2019 1356 04/23/2019 1453 Full Code [id number]  Sherene Sires, DO ED       Home/SNF/Other Home  Chief Complaint Diabetic ketoacidosis ([place]) [E11.10]  Level of Care/Admitting Diagnosis ED Disposition     ED Disposition  Admit   Condition  --   Comment  Hospital Area: [facility] [[id number]]  Level of Care: Stepdown [14]  Admit to SDU based on following criteria: Other see comments  Comments: DKA  May admit patient to [facility] or [facility] if equivalent level of care is available:: No  Covid Evaluation: Asymptomatic - no recent exposure (last 10 days) testing not required  Diagnosis: Diabetic ketoacidosis [facility]) [id number]  Admitting Physician: Vernelle Emerald [id number]  Attending Physician: Vernelle Emerald [id number]  Certification:: I certify this patient will need inpatient services for at least 2 midnights  Estimated Length of Stay: 3          Medical History Past Medical History:  Diagnosis Date   Aortic atherosclerosis ([place]) 08/24/2020   Hyperlipidemia    Hypertension    Marijuana abuse 09/28/2020   Sinus bradycardia    Stage 3 chronic kidney disease ([place])    Type 2 diabetes mellitus ([place])    Unspecified glaucoma 02/18/2008   Qualifier: Diagnosis of  By: Hassell Done FNP, [place]      Allergies Allergies   Allergen Reactions   Ibuprofen Other (See Comments)    Hurts his stomach    Pork-Derived Products     Patient does not eat pork products 2/2 to religious beliefs. Wants to avoid all pork containing medications   Aspirin Other (See Comments)    Stomach irritation    IV Location/Drains/Wounds Patient Lines/Drains/Airways Status     Active Line/Drains/Airways     Name Placement date Placement time Site Days   Peripheral IV 03/18/22 20 G Anterior;Distal;Left;Upper Arm 03/18/22  1053  Arm  less than 1            Labs/Imaging Results for orders placed or performed during the hospital encounter of 03/18/22 (from the past 48 hour(s))  CBG monitoring, ED     Status: Abnormal   Collection Time: 03/18/22 10:06 AM  Result Value Ref Range   Glucose-Capillary 475 (H) 70 - 99 mg/dL    Comment: Glucose reference range applies only to samples taken after fasting for at least 8 hours.  Lactic acid, plasma     Status: Abnormal   Collection Time: 03/18/22 10:29 AM  Result Value Ref Range   Lactic Acid, Venous 2.3 (HH) 0.5 - 1.9 mmol/L    Comment: CRITICAL RESULT CALLED TO, READ BACK BY AND VERIFIED WITH KISER,C. RN AT 1140 03/18/22 MULLINS,T Performed at [facility], [place] [address], [place], [place] [address]   CBC with Differential/Platelet  Status: Abnormal   Collection Time: 03/18/22 10:57 AM  Result Value Ref Range   WBC 13.7 (H) 4.0 - 10.5 K/uL   RBC 4.98 4.22 - 5.81 MIL/uL   Hemoglobin 15.4 13.0 - 17.0 g/dL   HCT 46.1 39.0 - 52.0 %   MCV 92.6 80.0 - 100.0 fL   MCH 30.9 26.0 - 34.0 pg   MCHC 33.4 30.0 - 36.0 g/dL   RDW 12.6 11.5 - 15.5 %   Platelets 433 (H) 150 - 400 K/uL   nRBC 0.0 0.0 - 0.2 %   Neutrophils Relative % 90 %   Neutro Abs 12.2 (H) 1.7 - 7.7 K/uL   Lymphocytes Relative 7 %   Lymphs Abs 1.0 0.7 - 4.0 K/uL   Monocytes Relative 3 %   Monocytes Absolute 0.4 0.1 - 1.0 K/uL   Eosinophils Relative 0 %   Eosinophils Absolute 0.0 0.0 - 0.5  K/uL   Basophils Relative 0 %   Basophils Absolute 0.0 0.0 - 0.1 K/uL   Immature Granulocytes 0 %   Abs Immature Granulocytes 0.06 0.00 - 0.07 K/uL    Comment: Performed at [facility], [place] [address], [place], [place] [address]  Comprehensive metabolic panel     Status: Abnormal   Collection Time: 03/18/22 10:57 AM  Result Value Ref Range   Sodium 126 (L) 135 - 145 mmol/L    Comment: ELECTROLYTES REPEATED TO VERIFY   Potassium 5.4 (H) 3.5 - 5.1 mmol/L   Chloride 88 (L) 98 - 111 mmol/L    Comment: ELECTROLYTES REPEATED TO VERIFY   CO2 16 (L) 22 - 32 mmol/L    Comment: ELECTROLYTES REPEATED TO VERIFY   Glucose, Bld 490 (H) 70 - 99 mg/dL    Comment: Glucose reference range applies only to samples taken after fasting for at least 8 hours.   BUN 53 (H) 6 - 20 mg/dL   Creatinine, Ser 2.27 (H) 0.61 - 1.24 mg/dL   Calcium 9.9 8.9 - 10.3 mg/dL   Total Protein 10.7 (H) 6.5 - 8.1 g/dL   Albumin 6.0 (H) 3.5 - 5.0 g/dL   AST 27 15 - 41 U/L   ALT 25 0 - 44 U/L   Alkaline Phosphatase 103 38 - 126 U/L   Total Bilirubin 1.2 0.3 - 1.2 mg/dL   GFR, Estimated 33 (L) >60 mL/min    Comment: (NOTE) Calculated using the CKD-EPI Creatinine Equation (2021)    Anion gap 22 (H) 5 - 15    Comment: ELECTROLYTES REPEATED TO VERIFY Performed at [place] [address], [place], [place] [address]   Lipase, blood     Status: Abnormal   Collection Time: 03/18/22 10:57 AM  Result Value Ref Range   Lipase 65 (H) 11 - 51 U/L    Comment: Performed at [facility], [place] [address], [place], [place] [address]  Blood gas, venous     Status: Abnormal   Collection Time: 03/18/22 10:57 AM  Result Value Ref Range   pH, Ven 7.43 7.25 - 7.43   pCO2, Ven 29 (L) 44 - 60 mmHg   pO2, Ven <31 (LL) 32 - 45 mmHg    Comment: CRITICAL RESULT CALLED TO, READ BACK BY AND VERIFIED WITH: zack T. RN AT [id number] ON 03/18/2022 BY MECIAL J.    Bicarbonate 19.2 (L) 20.0 -  28.0 mmol/L   Acid-base deficit 3.9 (H) 0.0 - 2.0 mmol/L   O2 Saturation 33.3 %  Patient temperature 37.0     Comment: Performed at [facility], [place] [address], [place], [place] [address]  CBG monitoring, ED     Status: Abnormal   Collection Time: 03/18/22 12:46 PM  Result Value Ref Range   Glucose-Capillary 433 (H) 70 - 99 mg/dL    Comment: Glucose reference range applies only to samples taken after fasting for at least 8 hours.  Urinalysis, Routine w reflex microscopic -Urine, Clean Catch     Status: None   Collection Time: 03/18/22 12:49 PM  Result Value Ref Range   Color, Urine YELLOW YELLOW   APPearance CLEAR CLEAR   Specific Gravity, Urine 1.014 1.005 - 1.030   pH 8.0 5.0 - 8.0   Glucose, UA NEGATIVE NEGATIVE mg/dL   Hgb urine dipstick NEGATIVE NEGATIVE   Bilirubin Urine NEGATIVE NEGATIVE   Ketones, ur NEGATIVE NEGATIVE mg/dL   Protein, ur NEGATIVE NEGATIVE mg/dL   Nitrite NEGATIVE NEGATIVE   Leukocytes,Ua NEGATIVE NEGATIVE    Comment: Performed at [facility], [place] [address], [place], [place] [address]   DG Chest Port 1 View  Result Date: 03/18/2022 CLINICAL DATA:  DKA EXAM: PORTABLE CHEST - 1 VIEW COMPARISON:  08/23/2020 FINDINGS: Cardiac silhouette is unremarkable. No pneumothorax or pleural effusion. The lungs are clear. The visualized skeletal structures are unremarkable. IMPRESSION: No acute cardiopulmonary process. Electronically Signed   By: Sammie Bench M.D.   On: 03/18/2022 12:57    Pending Labs Unresulted Labs (From admission, onward)     Start     Ordered   03/18/22 [id number]  Basic metabolic panel  (Diabetes Ketoacidosis (DKA))  STAT Now then every 4 hours ,   STAT      03/18/22 1156   03/18/22 1155  Beta-hydroxybutyric acid  (Diabetes Ketoacidosis (DKA))  Now then every 8 hours,   STAT (with URGENT occurrences)      03/18/22 1156   03/18/22 1029  Lactic acid, plasma  Now then every 2 hours,   R (with STAT occurrences)       03/18/22 1028            Vitals/Pain Today's Vitals   03/18/22 1014 03/18/22 1015 03/18/22 1017 03/18/22 1019  BP:    (!) 158/97  Pulse:    100  Resp:    18  Temp:      TempSrc:      SpO2: 100%   98%  Weight:   70.3 kg   Height:   5' 7"$  (1.702 m)   PainSc:  10-Worst pain ever      Isolation Precautions No active isolations  Medications Medications  insulin regular, human (MYXREDLIN) 100 units/ 100 mL infusion (10.5 Units/hr Intravenous New Bag/Given 03/18/22 1248)  lactated ringers infusion ( Intravenous New Bag/Given 03/18/22 1245)  dextrose 5 % in lactated ringers infusion (has no administration in time range)  dextrose 50 % solution 0-50 mL (has no administration in time range)  metoCLOPramide (REGLAN) injection 10 mg (10 mg Intravenous Given 03/18/22 1054)  morphine (PF) 4 MG/ML injection 4 mg (4 mg Intravenous Given 03/18/22 1054)  lactated ringers bolus 1,000 mL (1,000 mLs Intravenous New Bag/Given 03/18/22 1054)  lactated ringers bolus 500 mL (500 mLs Intravenous New Bag/Given 03/18/22 1245)    Mobility walks with person assist

## 2022-03-19 ENCOUNTER — Telehealth (HOSPITAL_COMMUNITY): Payer: Self-pay

## 2022-03-19 ENCOUNTER — Other Ambulatory Visit (HOSPITAL_COMMUNITY): Payer: Self-pay

## 2022-03-19 DIAGNOSIS — N179 Acute kidney failure, unspecified: Secondary | ICD-10-CM

## 2022-03-19 DIAGNOSIS — K920 Hematemesis: Secondary | ICD-10-CM | POA: Diagnosis not present

## 2022-03-19 DIAGNOSIS — I10 Essential (primary) hypertension: Secondary | ICD-10-CM | POA: Diagnosis not present

## 2022-03-19 DIAGNOSIS — E1110 Type 2 diabetes mellitus with ketoacidosis without coma: Secondary | ICD-10-CM | POA: Diagnosis not present

## 2022-03-19 HISTORY — DX: Acute kidney failure, unspecified: N17.9

## 2022-03-19 LAB — GLUCOSE, CAPILLARY
Glucose-Capillary: 121 mg/dL — ABNORMAL HIGH (ref 70–99)
Glucose-Capillary: 123 mg/dL — ABNORMAL HIGH (ref 70–99)
Glucose-Capillary: 124 mg/dL — ABNORMAL HIGH (ref 70–99)
Glucose-Capillary: 134 mg/dL — ABNORMAL HIGH (ref 70–99)
Glucose-Capillary: 138 mg/dL — ABNORMAL HIGH (ref 70–99)
Glucose-Capillary: 138 mg/dL — ABNORMAL HIGH (ref 70–99)
Glucose-Capillary: 143 mg/dL — ABNORMAL HIGH (ref 70–99)
Glucose-Capillary: 143 mg/dL — ABNORMAL HIGH (ref 70–99)
Glucose-Capillary: 144 mg/dL — ABNORMAL HIGH (ref 70–99)
Glucose-Capillary: 154 mg/dL — ABNORMAL HIGH (ref 70–99)
Glucose-Capillary: 165 mg/dL — ABNORMAL HIGH (ref 70–99)
Glucose-Capillary: 179 mg/dL — ABNORMAL HIGH (ref 70–99)
Glucose-Capillary: 205 mg/dL — ABNORMAL HIGH (ref 70–99)
Glucose-Capillary: 96 mg/dL (ref 70–99)

## 2022-03-19 LAB — BASIC METABOLIC PANEL
Anion gap: 13 (ref 5–15)
Anion gap: 13 (ref 5–15)
BUN: 36 mg/dL — ABNORMAL HIGH (ref 6–20)
BUN: 37 mg/dL — ABNORMAL HIGH (ref 6–20)
CO2: 21 mmol/L — ABNORMAL LOW (ref 22–32)
CO2: 22 mmol/L (ref 22–32)
Calcium: 9.3 mg/dL (ref 8.9–10.3)
Calcium: 9.3 mg/dL (ref 8.9–10.3)
Chloride: 99 mmol/L (ref 98–111)
Chloride: 99 mmol/L (ref 98–111)
Creatinine, Ser: 1.91 mg/dL — ABNORMAL HIGH (ref 0.61–1.24)
Creatinine, Ser: 1.96 mg/dL — ABNORMAL HIGH (ref 0.61–1.24)
GFR, Estimated: 41 mL/min — ABNORMAL LOW (ref >60)
GFR, Estimated: 40 mL/min — ABNORMAL LOW (ref >60)
Glucose, Bld: 189 mg/dL — ABNORMAL HIGH (ref 70–99)
Glucose, Bld: 124 mg/dL — ABNORMAL HIGH (ref 70–99)
Potassium: 4.1 mmol/L (ref 3.5–5.1)
Potassium: 3.4 mmol/L — ABNORMAL LOW (ref 3.5–5.1)
Sodium: 133 mmol/L — ABNORMAL LOW (ref 135–145)
Sodium: 134 mmol/L — ABNORMAL LOW (ref 135–145)

## 2022-03-19 LAB — HEMOGLOBIN A1C
Hgb A1c MFr Bld: 9.9 % — ABNORMAL HIGH (ref 4.8–5.6)
Mean Plasma Glucose: 237.43 mg/dL

## 2022-03-19 LAB — ALBUMIN: Albumin: 3.7 g/dL (ref 3.5–5.0)

## 2022-03-19 LAB — BETA-HYDROXYBUTYRIC ACID: Beta-Hydroxybutyric Acid: 1.01 mmol/L — ABNORMAL HIGH (ref 0.05–0.27)

## 2022-03-19 MED ORDER — LACTATED RINGERS IV SOLN: INTRAVENOUS | Status: DC

## 2022-03-19 MED ORDER — INSULIN ASPART 100 UNIT/ML IJ SOLN
0.0000 [IU] | INTRAMUSCULAR | Status: DC
  Administered 2022-03-19: 2 [IU] via SUBCUTANEOUS
  Administered 2022-03-19 – 2022-03-20 (×2): 1 [IU] via SUBCUTANEOUS

## 2022-03-19 MED ORDER — HYDROMORPHONE HCL 1 MG/ML IJ SOLN: 0.5000 mg | INTRAMUSCULAR | Status: DC | PRN

## 2022-03-19 MED ORDER — SODIUM CHLORIDE 0.9 % IV BOLUS
1000.0000 mL | Freq: Once | INTRAVENOUS | Status: AC
  Administered 2022-03-19: 1000 mL via INTRAVENOUS

## 2022-03-19 MED ORDER — LIVING WELL WITH DIABETES BOOK
Freq: Once | Status: AC
  Filled 2022-03-19: qty 1

## 2022-03-19 MED ORDER — POTASSIUM CHLORIDE CRYS ER 20 MEQ PO TBCR
40.0000 meq | EXTENDED_RELEASE_TABLET | Freq: Once | ORAL | Status: AC
  Administered 2022-03-19: 40 meq via ORAL
  Filled 2022-03-19: qty 2

## 2022-03-19 MED ORDER — ORAL CARE MOUTH RINSE: 15.0000 mL | OROMUCOSAL | Status: DC | PRN

## 2022-03-19 MED ORDER — INSULIN GLARGINE-YFGN 100 UNIT/ML ~~LOC~~ SOLN
10.0000 [IU] | SUBCUTANEOUS | Status: DC
  Administered 2022-03-19 – 2022-03-22 (×4): 10 [IU] via SUBCUTANEOUS
  Filled 2022-03-19 (×4): qty 0.1

## 2022-03-19 NOTE — Progress Notes (Signed)
  Transition of Care [facility]) Screening Note   Patient Details  Name: James Bradford Date of Birth: 06-May-1965   Transition of Care [facility]) CM/SW Contact:    Dessa Phi, RN Phone Number: 03/19/2022, 9:51 AM    Transition of Care Department [facility]) has reviewed patient and no TOC needs have been identified at this time. We will continue to monitor patient advancement through interdisciplinary progression rounds. If new patient transition needs arise, please place a TOC consult.

## 2022-03-19 NOTE — Telephone Encounter (Signed)
Pharmacy Patient Advocate Encounter  Insurance verification completed.    The patient is insured through [employer]   The patient is currently admitted and ran test claims for the following: Lantus, Toujeo, [place], Tresiba, [place], Novolog, Humalog.  Copays and coinsurance results were relayed to Inpatient clinical team.

## 2022-03-19 NOTE — TOC Benefit Eligibility Note (Signed)
Patient Teacher, English as a foreign language completed.    The patient is currently admitted and upon discharge could be taking Toujeo.  The current 30 day co-pay is $15.00   The patient is currently admitted and upon discharge could be taking [place].  The current 30 day co-pay is $15.00   The patient is currently admitted and upon discharge could be taking [place].  The current 30 day co-pay is $15.00   The patient is currently admitted and upon discharge could be taking Novolog.  The current 30 day co-pay is $15.00   The patient is currently admitted and upon discharge could be taking Humalog.  The current 30 day co-pay is $35.00   The patient is currently admitted and upon discharge could be taking Lantus.  The current 30 day co-pay is $35.00  The patient is insured through [employer]

## 2022-03-19 NOTE — Hospital Course (Signed)
Patient 57 year old gentleman history of hypertension, hyperlipidemia, alcohol abuse, type 2 diabetes presented to the ED with complaints of nausea vomiting abdominal pain x 2 to 3 days.  Patient seen in the ED noted to be in acute kidney injury with a creatinine of 2.27, severe hyperglycemia with blood glucose level of 490, elevated anion gap of 22, patient admitted for DKA.  Patient placed on the Endo tool/insulin drip, IV fluids, clear liquids.

## 2022-03-19 NOTE — Inpatient Diabetes Management (Addendum)
Inpatient Diabetes Program Recommendations  AACE/ADA: New Consensus Statement on Inpatient Glycemic Control (2015)  Target Ranges:  Prepandial:   less than 140 mg/dL      Peak postprandial:   less than 180 mg/dL (1-2 hours)      Critically ill patients:  140 - 180 mg/dL   Lab Results  Component Value Date   GLUCAP 124 (H) 03/19/2022   HGBA1C 7.8 (H) 12/10/2021    Review of Glycemic Control  Diabetes history: DM2 Outpatient Diabetes medications:  Basaglar 10 units QD-not taking Metformin 500 mg BID-not taking  Glipizide 10 mg BID-Not taking Current orders for Inpatient glycemic control: IV insulin  Inpatient Diabetes Program Recommendations:    When MD is ready to transition please consider:  Semglee 8 units QD (administer 2 hrs prior to discontinuing IV insulin) Novolog 0-9 units TID and 0-5 units QHS  Will speak with patient today regarding DM management at home.  Ordered the Living Well with Diabetes booklet.  Addendum@12$ :15:  Spoke with patient at bedside.  He states his insurance changed this year.  He used to have his insulins mailed to his home.  Since the new year and change in insurance he has not received his insulins.  He now has BSBS.  Asked pharmacy for a benefit check on insulins for DC.  He states he will start getting his insulin at [facility].  He can provide [employer] with his new insurance card.  Educated patient on [employer] Relion insulin which is available over the counter without a prescription if he were to ever run into this problem again.  Will attach information to AVS.    He has a glucometer at home and checks his BG regularly.  He is current with his PCP and endocrinologist, Dr. Meredith Pel.    For DC:  Tresiba 200 unit/ml flexpen order # [id number] ($15 co-pay) Novolog flexpen order # [id number] ($15 co-pay)  Will continue to follow while inpatient.  Thank you, Reche Dixon, MSN, [place] Diabetes Coordinator Inpatient Diabetes Program [phone] (team pager  from 8a-5p)

## 2022-03-19 NOTE — Progress Notes (Addendum)
PROGRESS NOTE    James Bradford  [id number] DOB: 1965-08-09 DOA: 03/18/2022 PCP: [facility]    Chief Complaint  Patient presents with   Hyperglycemia   Nausea    Brief Narrative:  Patient 57 year old gentleman history of hypertension, hyperlipidemia, alcohol abuse, type 2 diabetes presented to the ED with complaints of nausea vomiting abdominal pain x 2 to 3 days.  Patient seen in the ED noted to be in acute kidney injury with a creatinine of 2.27, severe hyperglycemia with blood glucose level of 490, elevated anion gap of 22, patient admitted for DKA.  Patient placed on the Endo tool/insulin drip, IV fluids, clear liquids.    Assessment & Plan:  Principal Problem:   Diabetic ketoacidosis without coma associated with type 2 diabetes mellitus (HCC) Active Problems:   Generalized abdominal pain   Lactic acidosis   Coffee ground emesis   Alcohol abuse   Acute renal failure superimposed on stage 3a chronic kidney disease (HCC)   Essential hypertension   AKI (acute kidney injury) ([place])    Assessment and Plan: * Diabetic ketoacidosis without coma associated with type 2 diabetes mellitus ([place]) Likely secondary to 2 months of noncompliance with medications Urinalysis unremarkable, bland. Chest x-ray negative for any infiltrate.  Patient afebrile. -Patient with no cardiac symptoms. Patient on IV fluids. Anion gap closed, acidosis resolved. Beta hydroxybutyric acid trending down. Hemoglobin A1c 9.9. Compliance stressed to patient. Will transition from insulin drip to subcutaneous insulin, Semglee 10 units daily, SSI, CBG every 4 hours. Advance diet to a full liquid diet and if tolerates will advance to a carb modified diet. Consult with diabetic coordinator for diabetes education. Supportive care.  Generalized abdominal pain Patient presented with severe generalized abdominal pain accompanied by multiple bouts of vomiting Lipase mildly elevated,  liver enzymes unremarkable, urinalysis unremarkable Abdominal pain improving with management of DKA.  CT abdomen and pelvis with no acute intra-abdominal process, mild thickening of distal esophagus, possible esophagitis.  Improving clinically.  Supportive care.   Lactic acidosis Notable lactic acidosis on chemistry likely secondary to volume depletion Improved with aggressive hydration.   Coffee ground emesis With patient's frequent bouts of nausea and vomiting over the past several days patient reports that several of these episodes were mixed with what sounds to be coffee-ground emesis Normal hemoglobin and hematocrit while here however these numbers are likely hemoconcentrated Suspect some degree of Mallory-Weiss tear eventually resulting in several bouts of coffee-ground emesis Continue Protonix 40 mg IV every 12 hours.   Follow H&H.   Alcohol abuse Obtaining exact information from patient as to how much alcohol he actually drinks is difficult Initially, patient statedf to admitting MD that he "only drinks socially" but then when asked again he reports drinking a pint of vodka daily in the days preceding his illness Continue CIWA protocol Counseling on alcohol cessation  Acute renal failure superimposed on stage 3a chronic kidney disease ([place]) Acute kidney injury superimposed on chronic kidney disease stage IIIa due to volume depletion. Urinalysis nitrite negative, leukocytes negative, negative protein. -Creatinine slowly trending down since admission, BP soft. Continue IV fluids with normal saline 1 L bolus, increase maintenance fluids to LR 125 cc an hour. -Repeat labs in the AM. If no significant improvement with renal function will need to check a renal ultrasound and urine electrolytes.    Essential hypertension BP soft this morning.   1 L normal saline bolus, increase IV fluids to LR at 125 cc an hour once patient  has been transition to subcutaneous insulin.           DVT prophylaxis: SCDs Code Status: Full Family Communication: Updated patient.  No family at bedside. Disposition: Home when clinically improved, blood glucose levels controlled, tolerating oral intake.  Status is: Inpatient Remains inpatient appropriate because: Severity of illness   Consultants:  None  Procedures:  CT abdomen and pelvis 03/18/2022 Chest x-ray 03/18/2022   Antimicrobials:  None   Subjective: Patient laying in bed.  States overall feels better than on admission.  Last episode of emesis last night.  Still with some nausea.  Patient denies any chest pain.  No shortness of breath.  Some dizziness with sitting up.  Objective: Vitals:   03/19/22 1211 03/19/22 1300 03/19/22 1400 03/19/22 1500  BP: 101/76 106/74 116/87 100/73  Pulse: 87 93 79 87  Resp: 10 (!) 9 (!) 9 10  Temp:      TempSrc:      SpO2: 100% 100% 100% 100%  Weight:      Height:        Intake/Output Summary (Last 24 hours) at 03/19/2022 1540 Last data filed at 03/19/2022 1500 Gross per 24 hour  Intake 4464.33 ml  Output 1175 ml  Net 3289.33 ml   Filed Weights   03/18/22 1017 03/18/22 1422  Weight: 70.3 kg 68.6 kg    Examination:  General exam: Appears calm and comfortable  Respiratory system: Clear to auscultation.  No wheezes, no crackles, no rhonchi.  Fair air movement.  Speaking in full sentences.  Respiratory effort normal. Cardiovascular system: S1 & S2 heard, RRR. No JVD, murmurs, rubs, gallops or clicks. No pedal edema. Gastrointestinal system: Abdomen is nondistended, soft and nontender. No organomegaly or masses felt. Normal bowel sounds heard. Central nervous system: Alert and oriented. No focal neurological deficits. Extremities: Symmetric 5 x 5 power. Skin: No rashes, lesions or ulcers Psychiatry: Judgement and insight appear normal. Mood & affect appropriate.     Data Reviewed:   CBC: Recent Labs  Lab 03/18/22 1057  WBC 13.7*  NEUTROABS 12.2*  HGB 15.4  HCT  46.1  MCV 92.6  PLT 433*    Basic Metabolic Panel: Recent Labs  Lab 03/18/22 1500 03/18/22 1934 03/18/22 2204 03/19/22 0305 03/19/22 0717  NA 135 134* 133* 133* 134*  K 3.5 3.7 3.5 4.1 3.4*  CL 111 96* 97* 99 99  CO2 9* 24 21* 21* 22  GLUCOSE 52* 137* 147* 189* 124*  BUN 26* 45* 42* 36* 37*  CREATININE 1.01 2.31* 1.99* 1.91* 1.96*  CALCIUM 6.5* 9.8 9.8 9.3 9.3    GFR: Estimated Creatinine Clearance: 39.8 mL/min (A) (by C-G formula based on SCr of 1.96 mg/dL (H)).  Liver Function Tests: Recent Labs  Lab 03/18/22 1057 03/19/22 0717  AST 27  --   ALT 25  --   ALKPHOS 103  --   BILITOT 1.2  --   PROT 10.7*  --   ALBUMIN 6.0* 3.7    CBG: Recent Labs  Lab 03/19/22 0817 03/19/22 0934 03/19/22 1032 03/19/22 1154 03/19/22 1346  GLUCAP 124* 138* 143* 121* 123*     Recent Results (from the past 240 hour(s))  MRSA Next Gen by PCR, Nasal     Status: None   Collection Time: 03/18/22  2:32 PM   Specimen: Nasal Mucosa; Nasal Swab  Result Value Ref Range Status   MRSA by PCR Next Gen NOT DETECTED NOT DETECTED Final    Comment: (NOTE) The GeneXpert MRSA  Assay (FDA approved for NASAL specimens only), is one component of a comprehensive MRSA colonization surveillance program. It is not intended to diagnose MRSA infection nor to guide or monitor treatment for MRSA infections. Test performance is not FDA approved in patients less than 64 years old. Performed at [facility], [place] [address], [place], [place] [address]          Radiology Studies: CT ABDOMEN PELVIS W CONTRAST  Result Date: 03/18/2022 CLINICAL DATA:  Abdominal pain, acute, nonlocalized. EXAM: CT ABDOMEN AND PELVIS WITH CONTRAST TECHNIQUE: Multidetector CT imaging of the abdomen and pelvis was performed using the standard protocol following bolus administration of intravenous contrast. RADIATION DOSE REDUCTION: This exam was performed according to the departmental dose-optimization  program which includes automated exposure control, adjustment of the mA and/or kV according to patient size and/or use of iterative reconstruction technique. CONTRAST:  102m OMNIPAQUE IOHEXOL 300 MG/ML  SOLN COMPARISON:  12/09/2021. FINDINGS: Lower chest: No acute abnormality. Hepatobiliary: No focal liver abnormality is seen. No gallstones, gallbladder wall thickening, or biliary dilatation. Pancreas: Unremarkable. No pancreatic ductal dilatation or surrounding inflammatory changes. Spleen: Normal in size without focal abnormality. Adrenals/Urinary Tract: The adrenal glands are within normal limits. Cysts are present in the kidneys bilaterally. Additional subcentimeter hypodensities are noted which are too small to further characterize. The kidneys enhance symmetrically. No renal calculus or hydronephrosis. The bladder is unremarkable. Stomach/Bowel: There is thickening of the walls of the distal esophagus. Stomach is within normal limits. Appendix is not seen. No evidence of bowel wall thickening, distention, or inflammatory changes. No free air or pneumatosis. Vascular/Lymphatic: No significant vascular findings are present. No enlarged abdominal or pelvic lymph nodes. Reproductive: Prostate is unremarkable. Other: No abdominopelvic ascites. Musculoskeletal: No acute osseous abnormality. IMPRESSION: 1. No acute intra-abdominal process. 2. Mild thickening of the distal esophagus, possible esophagitis. Electronically Signed   By: LBrett FairyM.D.   On: 03/18/2022 23:26   DG Chest Port 1 View  Result Date: 03/18/2022 CLINICAL DATA:  DKA EXAM: PORTABLE CHEST - 1 VIEW COMPARISON:  08/23/2020 FINDINGS: Cardiac silhouette is unremarkable. No pneumothorax or pleural effusion. The lungs are clear. The visualized skeletal structures are unremarkable. IMPRESSION: No acute cardiopulmonary process. Electronically Signed   By: JSammie BenchM.D.   On: 03/18/2022 12:57        Scheduled Meds:  Chlorhexidine  Gluconate Cloth  6 each Topical Daily   folic acid  1 mg Oral Daily   insulin aspart  0-9 Units Subcutaneous Q4H   insulin glargine-yfgn  10 Units Subcutaneous Q24H   LORazepam  0-4 mg Intravenous Q6H   Followed by   [Derrill MemoON 03/21/2022] LORazepam  0-4 mg Intravenous Q12H   multivitamin with minerals  1 tablet Oral Daily   pantoprazole (PROTONIX) IV  40 mg Intravenous Q12H   thiamine  100 mg Oral Daily   Or   thiamine  100 mg Intravenous Daily   Continuous Infusions:  dextrose 5% lactated ringers Stopped (03/19/22 1352)   insulin Stopped (03/19/22 1352)   lactated ringers Stopped (03/18/22 1456)   lactated ringers 125 mL/hr at 03/19/22 1500     LOS: 1 day    Time spent: 40 minutes    DIrine Seal MD [facility]   To contact the attending provider between 7A-7P or the covering provider during after hours 7P-7A, please log into the web site [email or web address] and access using universal [place] password for that web site. If you do not have the  password, please call the hospital operator.  03/19/2022, 3:40 PM

## 2022-03-20 DIAGNOSIS — K920 Hematemesis: Secondary | ICD-10-CM | POA: Diagnosis not present

## 2022-03-20 DIAGNOSIS — E1110 Type 2 diabetes mellitus with ketoacidosis without coma: Secondary | ICD-10-CM | POA: Diagnosis not present

## 2022-03-20 DIAGNOSIS — N179 Acute kidney failure, unspecified: Secondary | ICD-10-CM | POA: Diagnosis not present

## 2022-03-20 DIAGNOSIS — I10 Essential (primary) hypertension: Secondary | ICD-10-CM | POA: Diagnosis not present

## 2022-03-20 LAB — CBC WITH DIFFERENTIAL/PLATELET
Abs Immature Granulocytes: 0.01 10*3/uL (ref 0.00–0.07)
Basophils Absolute: 0 10*3/uL (ref 0.0–0.1)
Basophils Relative: 1 %
Eosinophils Absolute: 0.1 10*3/uL (ref 0.0–0.5)
Eosinophils Relative: 1 %
HCT: 36.2 % — ABNORMAL LOW (ref 39.0–52.0)
Hemoglobin: 11.9 g/dL — ABNORMAL LOW (ref 13.0–17.0)
Immature Granulocytes: 0 %
Lymphocytes Relative: 45 %
Lymphs Abs: 2.4 10*3/uL (ref 0.7–4.0)
MCH: 30.9 pg (ref 26.0–34.0)
MCHC: 32.9 g/dL (ref 30.0–36.0)
MCV: 94 fL (ref 80.0–100.0)
Monocytes Absolute: 0.5 10*3/uL (ref 0.1–1.0)
Monocytes Relative: 9 %
Neutro Abs: 2.4 10*3/uL (ref 1.7–7.7)
Neutrophils Relative %: 44 %
Platelets: 304 10*3/uL (ref 150–400)
RBC: 3.85 MIL/uL — ABNORMAL LOW (ref 4.22–5.81)
RDW: 12.4 % (ref 11.5–15.5)
WBC: 5.3 10*3/uL (ref 4.0–10.5)
nRBC: 0 % (ref 0.0–0.2)

## 2022-03-20 LAB — GLUCOSE, CAPILLARY
Glucose-Capillary: 79 mg/dL (ref 70–99)
Glucose-Capillary: 89 mg/dL (ref 70–99)
Glucose-Capillary: 107 mg/dL — ABNORMAL HIGH (ref 70–99)
Glucose-Capillary: 235 mg/dL — ABNORMAL HIGH (ref 70–99)
Glucose-Capillary: 262 mg/dL — ABNORMAL HIGH (ref 70–99)
Glucose-Capillary: 127 mg/dL — ABNORMAL HIGH (ref 70–99)

## 2022-03-20 LAB — BASIC METABOLIC PANEL
Anion gap: 8 (ref 5–15)
BUN: 21 mg/dL — ABNORMAL HIGH (ref 6–20)
CO2: 23 mmol/L (ref 22–32)
Calcium: 8.9 mg/dL (ref 8.9–10.3)
Chloride: 107 mmol/L (ref 98–111)
Creatinine, Ser: 1.43 mg/dL — ABNORMAL HIGH (ref 0.61–1.24)
GFR, Estimated: 58 mL/min — ABNORMAL LOW (ref >60)
Glucose, Bld: 80 mg/dL (ref 70–99)
Potassium: 3.7 mmol/L (ref 3.5–5.1)
Sodium: 138 mmol/L (ref 135–145)

## 2022-03-20 MED ORDER — INSULIN ASPART 100 UNIT/ML IJ SOLN
0.0000 [IU] | Freq: Three times a day (TID) | INTRAMUSCULAR | Status: DC
  Administered 2022-03-20: 3 [IU] via SUBCUTANEOUS
  Administered 2022-03-20: 5 [IU] via SUBCUTANEOUS
  Administered 2022-03-21: 1 [IU] via SUBCUTANEOUS
  Administered 2022-03-21: 5 [IU] via SUBCUTANEOUS
  Administered 2022-03-21 – 2022-03-22 (×2): 2 [IU] via SUBCUTANEOUS
  Administered 2022-03-22: 5 [IU] via SUBCUTANEOUS

## 2022-03-20 MED ORDER — PANTOPRAZOLE SODIUM 40 MG PO TBEC
40.0000 mg | DELAYED_RELEASE_TABLET | Freq: Two times a day (BID) | ORAL | Status: DC
  Administered 2022-03-20 – 2022-03-22 (×5): 40 mg via ORAL
  Filled 2022-03-20 (×5): qty 1

## 2022-03-20 NOTE — Progress Notes (Signed)
Nutrition Note  RD consulted for nutrition education regarding diabetes.   Lab Results  Component Value Date   HGBA1C 9.9 (H) 03/19/2022    RD provided "Carbohydrate Counting for People with Diabetes" and "Plate Method" handout from the Academy of Nutrition and Dietetics. Discussed different food groups and their effects on blood sugar, emphasizing carbohydrate-containing foods. Provided list of carbohydrates and recommended serving sizes of common foods.  Discussed importance of controlled and consistent carbohydrate intake throughout the day. Provided examples of ways to balance meals/snacks and encouraged intake of high-fiber, whole grain complex carbohydrates. Teach back method used.  Expect fair compliance. Pt fasting in preparation for Ramadan in March/April. Pt states he is able to eat prior to first prayer and after the last prayer around sunset. Reviewed complete meals with patient and encouraged him to consume meals prior to first prayer and following last prayer of the day.  We reviewed protein sources that are not beef or pork.  Body mass index is 23.69 kg/m. Pt meets criteria for normal based on current BMI.  Current diet order is CHO modified. Labs and medications reviewed. No further nutrition interventions warranted at this time. If additional nutrition issues arise, please re-consult RD.  James Bibles, MS, RD, LDN Inpatient Clinical Dietitian Contact information available via Amion

## 2022-03-20 NOTE — TOC Progression Note (Signed)
Transition of Care [facility]) - Progression Note    Patient Details  Name: Quamere Lemas MRN: [id number] Date of Birth: 07-30-1965  Transition of [facility]) CM/SW [place], LCSW Phone Number: 03/20/2022, 11:08 AM  Clinical Narrative:    [facility] consulted for PCP needs. Pt has private insurance and has PCP w/ [facility] listed. CSW spoke with pt via t/c to room. Pt shares that he sees a provider at [facility] for diabetes but, does not have a PCP there. Pt is not satisifed with services he has been receiving at [place] and would like to find a new provider. CSW shared that as pt has private insurance pt will need to call insurance provider to determine PCP's in-network with insurance. CSW provided list of PCP's on AVS for pt to call.   No further TOC needs identified. TOC signing off.         Expected Discharge Plan and Services                                               Social Determinants of Health (SDOH) Interventions SDOH Screenings   Food Insecurity: No Food Insecurity (03/20/2022)  Housing: Medium Risk (03/20/2022)  Transportation Needs: Unmet Transportation Needs (03/20/2022)  Utilities: Not At Risk (03/20/2022)  Tobacco Use: Low Risk  (03/18/2022)    Readmission Risk Interventions    03/20/2022   11:05 AM 03/19/2022    9:53 AM 12/13/2021    1:26 PM  Readmission Risk Prevention Plan  Transportation Screening Complete Complete Complete  PCP or Specialist Appt within 3-5 Days Complete Complete Complete  HRI or Home Care Consult Complete Complete Complete  Social Work Consult for [place] Planning/Counseling Complete Complete Complete  Palliative Care Screening Not Applicable Not Applicable Not Applicable  Medication Review Press photographer) Complete Complete Complete

## 2022-03-20 NOTE — Progress Notes (Signed)
PROGRESS NOTE    James Bradford  [id number] DOB: 1965/12/14 DOA: 03/18/2022 PCP: Medicine, [facility]    Chief Complaint  Patient presents with   Hyperglycemia   Nausea    Brief Narrative:  Patient 57 year old gentleman history of hypertension, hyperlipidemia, alcohol abuse, type 2 diabetes presented to the ED with complaints of nausea vomiting abdominal pain x 2 to 3 days.  Patient seen in the ED noted to be in acute kidney injury with a creatinine of 2.27, severe hyperglycemia with blood glucose level of 490, elevated anion gap of 22, patient admitted for DKA.  Patient placed on the Endo tool/insulin drip, IV fluids, clear liquids.    Assessment & Plan:  Principal Problem:   Diabetic ketoacidosis without coma associated with type 2 diabetes mellitus (HCC) Active Problems:   Generalized abdominal pain   Lactic acidosis   Coffee ground emesis   Alcohol abuse   Acute renal failure superimposed on stage 3a chronic kidney disease (HCC)   Essential hypertension   AKI (acute kidney injury) ([place])    Assessment and Plan: * Diabetic ketoacidosis without coma associated with type 2 diabetes mellitus ([place]) Likely secondary to 2 months of noncompliance with medications Urinalysis unremarkable, bland. Chest x-ray negative for any infiltrate.   Patient afebrile. Patient with no cardiac symptoms. Patient on IV fluids. Anion gap closed, acidosis resolved. Beta hydroxybutyric acid trending down. Hemoglobin A1c 9.9. Compliance stressed to patient. Patient was transitioned from insulin drip to subcutaneous insulin, Semglee 10 units daily, SSI. CBG at 89 this morning. Tolerating full liquid diet, advance to carb modified diet. Change CBGs to before meals and at bedtime. Consult with diabetic coordinator. Supportive care.  Generalized abdominal pain Patient presented with severe generalized abdominal pain accompanied by multiple bouts of vomiting Lipase mildly  elevated, liver enzymes unremarkable, urinalysis unremarkable Abdominal pain improved with management of DKA.  CT abdomen and pelvis with no acute intra-abdominal process, mild thickening of distal esophagus, possible esophagitis.  Improving clinically.  Continue PPI. Supportive care.   Lactic acidosis Notable lactic acidosis on chemistry likely secondary to volume depletion Improved with aggressive hydration.   Coffee ground emesis With patient's frequent bouts of nausea and vomiting over the past several days patient reports that several of these episodes were mixed with what sounds to be coffee-ground emesis Normal hemoglobin and hematocrit while here however these numbers are likely hemoconcentrated Suspect some degree of Mallory-Weiss tear eventually resulting in several bouts of coffee-ground emesis Patient with no further bouts of coffee-ground emesis. Continue PPI twice daily. Follow H&H.   Alcohol abuse Obtaining exact information from patient as to how much alcohol he actually drinks is difficult Initially, patient statedf to admitting MD that he "only drinks socially" but then when asked again he reports drinking a pint of vodka daily in the days preceding his illness No significant withdrawal noted during this hospitalization. Continue CIWA protocol Counseling on alcohol cessation  Acute renal failure superimposed on stage 3a chronic kidney disease ([place]) Acute kidney injury superimposed on chronic kidney disease stage IIIa due to volume depletion. Urinalysis nitrite negative, leukocytes negative, negative protein. Creatinine improving with hydration.   Continue IV fluids. -Repeat labs in the AM.    Essential hypertension Soft blood pressure improved. Continue IV fluids.         DVT prophylaxis: SCDs Code Status: Full Family Communication: Updated patient.  No family at bedside. Disposition: Home when clinically improved, blood glucose levels controlled,  tolerating oral intake.  Status is:  Inpatient Remains inpatient appropriate because: Severity of illness   Consultants:  None  Procedures:  CT abdomen and pelvis 03/18/2022 Chest x-ray 03/18/2022   Antimicrobials:  None   Subjective: Laying in bed watching something on iPad.  Denies any chest pain.  No shortness of breath.  Nausea and vomiting and abdominal pain have improved.  Tolerating full liquids.  Still with some lightheadedness and dizziness from supine to standing.  Patient although slowly improving.   Objective: Vitals:   03/20/22 0400 03/20/22 0434 03/20/22 0625 03/20/22 1338  BP: (!) 91/59 114/75 117/71 (!) 127/95  Pulse: 93 94 90 90  Resp: 11 20 17 20  $ Temp:  98.1 F (36.7 C) 98.3 F (36.8 C) 98.5 F (36.9 C)  TempSrc:    Oral  SpO2: 98% 100% 100% 100%  Weight:      Height:        Intake/Output Summary (Last 24 hours) at 03/20/2022 1620 Last data filed at 03/20/2022 0317 Gross per 24 hour  Intake 1523.68 ml  Output 500 ml  Net 1023.68 ml   Filed Weights   03/18/22 1017 03/18/22 1422  Weight: 70.3 kg 68.6 kg    Examination:  General exam: NAD. Respiratory system: CTAB.  No wheezes, no crackles, no rhonchi.  Fair air movement.  Speaking in full sentences.   Cardiovascular system: Regular rate rhythm no murmurs rubs or gallops.  No JVD.  No lower extremity edema.  Gastrointestinal system: Abdomen is soft, nontender, nondistended, positive bowel sounds.  No rebound.  No guarding.  Central nervous system: Alert and oriented. No focal neurological deficits. Extremities: Symmetric 5 x 5 power. Skin: No rashes, lesions or ulcers Psychiatry: Judgement and insight appear normal. Mood & affect appropriate.     Data Reviewed:   CBC: Recent Labs  Lab 03/18/22 1057 03/20/22 0304  WBC 13.7* 5.3  NEUTROABS 12.2* 2.4  HGB 15.4 11.9*  HCT 46.1 36.2*  MCV 92.6 94.0  PLT 433* [id number]    Basic Metabolic Panel: Recent Labs  Lab 03/18/22 1934 03/18/22 2204  03/19/22 0305 03/19/22 0717 03/20/22 0304  NA 134* 133* 133* 134* 138  K 3.7 3.5 4.1 3.4* 3.7  CL 96* 97* 99 99 107  CO2 24 21* 21* 22 23  GLUCOSE 137* 147* 189* 124* 80  BUN 45* 42* 36* 37* 21*  CREATININE 2.31* 1.99* 1.91* 1.96* 1.43*  CALCIUM 9.8 9.8 9.3 9.3 8.9    GFR: Estimated Creatinine Clearance: 54.6 mL/min (A) (by C-G formula based on SCr of 1.43 mg/dL (H)).  Liver Function Tests: Recent Labs  Lab 03/18/22 1057 03/19/22 0717  AST 27  --   ALT 25  --   ALKPHOS 103  --   BILITOT 1.2  --   PROT 10.7*  --   ALBUMIN 6.0* 3.7    CBG: Recent Labs  Lab 03/19/22 2356 03/20/22 0432 03/20/22 0633 03/20/22 0744 03/20/22 1137  GLUCAP 144* 79 89 107* 235*     Recent Results (from the past 240 hour(s))  MRSA Next Gen by PCR, Nasal     Status: None   Collection Time: 03/18/22  2:32 PM   Specimen: Nasal Mucosa; Nasal Swab  Result Value Ref Range Status   MRSA by PCR Next Gen NOT DETECTED NOT DETECTED Final    Comment: (NOTE) The GeneXpert MRSA Assay (FDA approved for NASAL specimens only), is one component of a comprehensive MRSA colonization surveillance program. It is not intended to diagnose MRSA infection nor to guide or  monitor treatment for MRSA infections. Test performance is not FDA approved in patients less than 76 years old. Performed at [facility], [place] [address], [place],  [address]          Radiology Studies: CT ABDOMEN PELVIS W CONTRAST  Result Date: 03/18/2022 CLINICAL DATA:  Abdominal pain, acute, nonlocalized. EXAM: CT ABDOMEN AND PELVIS WITH CONTRAST TECHNIQUE: Multidetector CT imaging of the abdomen and pelvis was performed using the standard protocol following bolus administration of intravenous contrast. RADIATION DOSE REDUCTION: This exam was performed according to the departmental dose-optimization program which includes automated exposure control, adjustment of the mA and/or kV according to patient size  and/or use of iterative reconstruction technique. CONTRAST:  25m OMNIPAQUE IOHEXOL 300 MG/ML  SOLN COMPARISON:  12/09/2021. FINDINGS: Lower chest: No acute abnormality. Hepatobiliary: No focal liver abnormality is seen. No gallstones, gallbladder wall thickening, or biliary dilatation. Pancreas: Unremarkable. No pancreatic ductal dilatation or surrounding inflammatory changes. Spleen: Normal in size without focal abnormality. Adrenals/Urinary Tract: The adrenal glands are within normal limits. Cysts are present in the kidneys bilaterally. Additional subcentimeter hypodensities are noted which are too small to further characterize. The kidneys enhance symmetrically. No renal calculus or hydronephrosis. The bladder is unremarkable. Stomach/Bowel: There is thickening of the walls of the distal esophagus. Stomach is within normal limits. Appendix is not seen. No evidence of bowel wall thickening, distention, or inflammatory changes. No free air or pneumatosis. Vascular/Lymphatic: No significant vascular findings are present. No enlarged abdominal or pelvic lymph nodes. Reproductive: Prostate is unremarkable. Other: No abdominopelvic ascites. Musculoskeletal: No acute osseous abnormality. IMPRESSION: 1. No acute intra-abdominal process. 2. Mild thickening of the distal esophagus, possible esophagitis. Electronically Signed   By: LBrett FairyM.D.   On: 03/18/2022 23:26        Scheduled Meds:  folic acid  1 mg Oral Daily   insulin aspart  0-9 Units Subcutaneous TID WC   insulin glargine-yfgn  10 Units Subcutaneous Q24H   LORazepam  0-4 mg Intravenous Q6H   Followed by   [Derrill MemoON 03/21/2022] LORazepam  0-4 mg Intravenous Q12H   multivitamin with minerals  1 tablet Oral Daily   pantoprazole  40 mg Oral BID   thiamine  100 mg Oral Daily   Continuous Infusions:  lactated ringers Stopped (03/18/22 1456)   lactated ringers 125 mL/hr at 03/20/22 0614     LOS: 2 days    Time spent: 40  minutes    DIrine Seal MD [facility]   To contact the attending provider between 7A-7P or the covering provider during after hours 7P-7A, please log into the web site [email or web address] and access using universal [place] password for that web site. If you do not have the password, please call the hospital operator.  03/20/2022, 4:20 PM

## 2022-03-21 DIAGNOSIS — K920 Hematemesis: Secondary | ICD-10-CM | POA: Diagnosis not present

## 2022-03-21 DIAGNOSIS — I10 Essential (primary) hypertension: Secondary | ICD-10-CM | POA: Diagnosis not present

## 2022-03-21 DIAGNOSIS — N179 Acute kidney failure, unspecified: Secondary | ICD-10-CM | POA: Diagnosis not present

## 2022-03-21 DIAGNOSIS — E1110 Type 2 diabetes mellitus with ketoacidosis without coma: Secondary | ICD-10-CM | POA: Diagnosis not present

## 2022-03-21 LAB — CBC
HCT: 31.6 % — ABNORMAL LOW (ref 39.0–52.0)
Hemoglobin: 10.3 g/dL — ABNORMAL LOW (ref 13.0–17.0)
MCH: 30.7 pg (ref 26.0–34.0)
MCHC: 32.6 g/dL (ref 30.0–36.0)
MCV: 94 fL (ref 80.0–100.0)
Platelets: 253 10*3/uL (ref 150–400)
RBC: 3.36 MIL/uL — ABNORMAL LOW (ref 4.22–5.81)
RDW: 12.2 % (ref 11.5–15.5)
WBC: 3.8 10*3/uL — ABNORMAL LOW (ref 4.0–10.5)
nRBC: 0 % (ref 0.0–0.2)

## 2022-03-21 LAB — BASIC METABOLIC PANEL
Anion gap: 9 (ref 5–15)
BUN: 21 mg/dL — ABNORMAL HIGH (ref 6–20)
CO2: 24 mmol/L (ref 22–32)
Calcium: 8.7 mg/dL — ABNORMAL LOW (ref 8.9–10.3)
Chloride: 105 mmol/L (ref 98–111)
Creatinine, Ser: 1.36 mg/dL — ABNORMAL HIGH (ref 0.61–1.24)
GFR, Estimated: >60 mL/min (ref >60)
Glucose, Bld: 119 mg/dL — ABNORMAL HIGH (ref 70–99)
Potassium: 3.8 mmol/L (ref 3.5–5.1)
Sodium: 138 mmol/L (ref 135–145)

## 2022-03-21 LAB — GLUCOSE, CAPILLARY
Glucose-Capillary: 135 mg/dL — ABNORMAL HIGH (ref 70–99)
Glucose-Capillary: 160 mg/dL — ABNORMAL HIGH (ref 70–99)
Glucose-Capillary: 170 mg/dL — ABNORMAL HIGH (ref 70–99)
Glucose-Capillary: 191 mg/dL — ABNORMAL HIGH (ref 70–99)
Glucose-Capillary: 261 mg/dL — ABNORMAL HIGH (ref 70–99)

## 2022-03-21 MED ORDER — SODIUM CHLORIDE 0.9 % IV BOLUS
1000.0000 mL | Freq: Once | INTRAVENOUS | Status: AC
  Administered 2022-03-21: 1000 mL via INTRAVENOUS

## 2022-03-21 NOTE — Progress Notes (Signed)
PROGRESS NOTE    James Bradford  [id number] DOB: 1965-04-01 DOA: 03/18/2022 PCP: Medicine, [facility]    Chief Complaint  Patient presents with   Hyperglycemia   Nausea    Brief Narrative:  Patient 57 year old gentleman history of hypertension, hyperlipidemia, alcohol abuse, type 2 diabetes presented to the ED with complaints of nausea vomiting abdominal pain x 2 to 3 days.  Patient seen in the ED noted to be in acute kidney injury with a creatinine of 2.27, severe hyperglycemia with blood glucose level of 490, elevated anion gap of 22, patient admitted for DKA.  Patient placed on the Endo tool/insulin drip, IV fluids, clear liquids.    Assessment & Plan:  Principal Problem:   Diabetic ketoacidosis without coma associated with type 2 diabetes mellitus (HCC) Active Problems:   Generalized abdominal pain   Lactic acidosis   Coffee ground emesis   Alcohol abuse   Acute renal failure superimposed on stage 3a chronic kidney disease (HCC)   Essential hypertension   AKI (acute kidney injury) ([place])    Assessment and Plan: * Diabetic ketoacidosis without coma associated with type 2 diabetes mellitus ([place]) Likely secondary to 2 months of noncompliance with medications Urinalysis unremarkable, bland. Chest x-ray negative for any infiltrate.   Patient afebrile. Patient with no cardiac symptoms. Patient on IV fluids. Anion gap closed, acidosis resolved. Beta hydroxybutyric acid trending down. Hemoglobin A1c 9.9. Compliance stressed to patient. Patient was transitioned from insulin drip to subcutaneous insulin, Semglee 10 units daily, SSI. CBG at 135 this morning. Tolerating carb modified diet.   Diabetic coordinator consulted and following.  Supportive care.  Generalized abdominal pain Patient presented with severe generalized abdominal pain accompanied by multiple bouts of vomiting Lipase mildly elevated, liver enzymes unremarkable, urinalysis  unremarkable Abdominal pain improved with management of DKA.  CT abdomen and pelvis with no acute intra-abdominal process, mild thickening of distal esophagus, possible esophagitis.  Clinical improvement. PPI. Supportive care.   Lactic acidosis Notable lactic acidosis on chemistry likely secondary to volume depletion Improved with aggressive hydration.   Coffee ground emesis With patient's frequent bouts of nausea and vomiting over the past several days patient reports that several of these episodes were mixed with what sounds to be coffee-ground emesis Normal hemoglobin and hematocrit while here however these numbers are likely hemoconcentrated Suspect some degree of Mallory-Weiss tear eventually resulting in several bouts of coffee-ground emesis Patient with no further bouts of coffee-ground emesis. Continue PPI twice daily. Hemoglobin stable at 10.3. Follow H&H.   Alcohol abuse Obtaining exact information from patient as to how much alcohol he actually drinks is difficult Initially, patient statedf to admitting MD that he "only drinks socially" but then when asked again he reports drinking a pint of vodka daily in the days preceding his illness No significant withdrawal noted during this hospitalization. Continue CIWA protocol Counseling on alcohol cessation  Acute renal failure superimposed on stage 3a chronic kidney disease ([place]) Acute kidney injury superimposed on chronic kidney disease stage IIIa due to volume depletion. Urinalysis nitrite negative, leukocytes negative, negative protein. Creatinine improving with hydration.   Continue IV fluids for another 24 hours.. -Repeat labs in the AM.    Essential hypertension Soft blood pressure improved.   Continue IV fluids for another 24 hours.          DVT prophylaxis: SCDs Code Status: Full Family Communication: Updated patient.  No family at bedside. Disposition: Home when clinically improved, blood glucose levels  controlled, tolerating oral  intake hopefully in the next 24 to 48 hours..  Status is: Inpatient Remains inpatient appropriate because: Severity of illness   Consultants:  None  Procedures:  CT abdomen and pelvis 03/18/2022 Chest x-ray 03/18/2022   Antimicrobials:  None   Subjective: Standing up by the sink.  No chest pain.  No shortness of breath.  No nausea or vomiting.  Abdominal pain improved.  Still with some complaints of dizziness.  Tolerating current diet.  Overall feeling better.    Objective: Vitals:   03/20/22 1338 03/20/22 2029 03/21/22 0924 03/21/22 1350  BP: (!) 127/95 (!) 149/98  126/84  Pulse: 90 96  96  Resp: 20 17  16  $ Temp: 98.5 F (36.9 C) 98.5 F (36.9 C)  97.9 F (36.6 C)  TempSrc: Oral   Oral  SpO2: 100% 100% 99% 100%  Weight:      Height:        Intake/Output Summary (Last 24 hours) at 03/21/2022 1905 Last data filed at 03/21/2022 1700 Gross per 24 hour  Intake 1736.82 ml  Output 2800 ml  Net -1063.18 ml   Filed Weights   03/18/22 1017 03/18/22 1422  Weight: 70.3 kg 68.6 kg    Examination:  General exam: NAD. Respiratory system: Lungs clear to auscultation bilaterally.  No wheezes, no crackles, no rhonchi.  Fair air movement.  Speaking in full sentences.  Cardiovascular system: RRR no murmurs rubs or gallops.  No JVD.  No lower extremity edema.  Gastrointestinal system: Abdomen is soft, nontender, nondistended, positive bowel sounds.  No rebound.  No guarding.    Central nervous system: Alert and oriented. No focal neurological deficits. Extremities: Symmetric 5 x 5 power. Skin: No rashes, lesions or ulcers Psychiatry: Judgement and insight appear normal. Mood & affect appropriate.     Data Reviewed:   CBC: Recent Labs  Lab 03/18/22 1057 03/20/22 0304 03/21/22 0555  WBC 13.7* 5.3 3.8*  NEUTROABS 12.2* 2.4  --   HGB 15.4 11.9* 10.3*  HCT 46.1 36.2* 31.6*  MCV 92.6 94.0 94.0  PLT 433* 304 [id number]    Basic Metabolic  Panel: Recent Labs  Lab 03/18/22 2204 03/19/22 0305 03/19/22 0717 03/20/22 0304 03/21/22 0555  NA 133* 133* 134* 138 138  K 3.5 4.1 3.4* 3.7 3.8  CL 97* 99 99 107 105  CO2 21* 21* 22 23 24  $ GLUCOSE 147* 189* 124* 80 119*  BUN 42* 36* 37* 21* 21*  CREATININE 1.99* 1.91* 1.96* 1.43* 1.36*  CALCIUM 9.8 9.3 9.3 8.9 8.7*    GFR: Estimated Creatinine Clearance: 57.4 mL/min (A) (by C-G formula based on SCr of 1.36 mg/dL (H)).  Liver Function Tests: Recent Labs  Lab 03/18/22 1057 03/19/22 0717  AST 27  --   ALT 25  --   ALKPHOS 103  --   BILITOT 1.2  --   PROT 10.7*  --   ALBUMIN 6.0* 3.7    CBG: Recent Labs  Lab 03/20/22 1659 03/20/22 2025 03/21/22 0833 03/21/22 1136 03/21/22 1631  GLUCAP 262* 127* 135* 170* 261*     Recent Results (from the past 240 hour(s))  MRSA Next Gen by PCR, Nasal     Status: None   Collection Time: 03/18/22  2:32 PM   Specimen: Nasal Mucosa; Nasal Swab  Result Value Ref Range Status   MRSA by PCR Next Gen NOT DETECTED NOT DETECTED Final    Comment: (NOTE) The GeneXpert MRSA Assay (FDA approved for NASAL specimens only), is one component of  a comprehensive MRSA colonization surveillance program. It is not intended to diagnose MRSA infection nor to guide or monitor treatment for MRSA infections. Test performance is not FDA approved in patients less than 78 years old. Performed at [facility], [place] [address] [place], [place] [address]          Radiology Studies: No results found.      Scheduled Meds:  folic acid  1 mg Oral Daily   insulin aspart  0-9 Units Subcutaneous TID WC   insulin glargine-yfgn  10 Units Subcutaneous Q24H   LORazepam  0-4 mg Intravenous Q12H   multivitamin with minerals  1 tablet Oral Daily   pantoprazole  40 mg Oral BID   thiamine  100 mg Oral Daily   Continuous Infusions:  lactated ringers Stopped (03/18/22 1456)   lactated ringers 125 mL/hr at 03/21/22 1222     LOS: 3  days    Time spent: 40 minutes    Irine Seal, MD [facility]   To contact the attending provider between 7A-7P or the covering provider during after hours 7P-7A, please log into the web site [email or web address] and access using universal [place] password for that web site. If you do not have the password, please call the hospital operator.  03/21/2022, 7:05 PM

## 2022-03-21 NOTE — Inpatient Diabetes Management (Signed)
Inpatient Diabetes Program Recommendations  AACE/ADA: New Consensus Statement on Inpatient Glycemic Control (2015)  Target Ranges:  Prepandial:   less than 140 mg/dL      Peak postprandial:   less than 180 mg/dL (1-2 hours)      Critically ill patients:  140 - 180 mg/dL   Lab Results  Component Value Date   GLUCAP 135 (H) 03/21/2022   HGBA1C 9.9 (H) 03/19/2022    Review of Glycemic Control  Latest Reference Range & Units 03/20/22 07:44 03/20/22 11:37 03/20/22 16:59 03/20/22 20:25 03/21/22 08:33  Glucose-Capillary 70 - 99 mg/dL 107 (H) 235 (H) 262 (H) 127 (H) 135 (H)  (H): Data is abnormally high  Diabetes history: DM2 Outpatient Diabetes medications:  Basaglar 10 units QD-not taking Metformin 500 mg BID-not taking  Glipizide 10 mg BID-Not taking Current orders for Inpatient glycemic control:  Semglee 10 units QD Novolog 0-9 units TID   Inpatient Diabetes Program Recommendations:    If patient is to remain inpatient,  please consider:  Novolog 3 units TID as PP are elevated.    For DC:   Tresiba 200 unit/ml flexpen order # [id number] ($15 co-pay) Novolog flexpen order # [id number] ($15 co-pay)  Will continue to follow while inpatient.  Thank you, Reche Dixon, MSN, [place] Diabetes Coordinator Inpatient Diabetes Program [phone] (team pager from 8a-5p)

## 2022-03-21 NOTE — Evaluation (Signed)
Physical Therapy Evaluation Patient Details Name: James Bradford MRN: [id number] DOB: 1966/01/19 Today's Date: 03/21/2022  History of Present Illness  Pt is 57 yo male admitted 03/18/22 with DKA. Pt with hx including HTN, HLD, EtOH abuse, DM2  Clinical Impression  Pt admitted with above diagnosis.  He has been ambulating in room on his own.  Pt demonstrated safe transfers at supervision level and ambulated 500' with supervision and no AD.  He had some c/o feeling dizzy but later described as a little faint when OOB but all VSS and pt did not demonstrate instability with balance.  Dizziness improved in bed, did not worsen with head turns or EOEM - does not appear to be vestibular in nature.  Pt does not require further skilled PT services.        Recommendations for follow up therapy are one component of a multi-disciplinary discharge planning process, led by the attending physician.  Recommendations may be updated based on patient status, additional functional criteria and insurance authorization.  Follow Up Recommendations No PT follow up      Assistance Recommended at Discharge None  Patient can return home with the following       Equipment Recommendations None recommended by PT  Recommendations for Other Services       Functional Status Assessment Patient has not had a recent decline in their functional status     Precautions / Restrictions Precautions Precautions: Fall      Mobility  Bed Mobility Overal bed mobility: Independent Bed Mobility: Supine to Sit, Sit to Supine     Supine to sit: Independent Sit to supine: Independent        Transfers Overall transfer level: Needs assistance Equipment used: None Transfers: Sit to/from Stand Sit to Stand: Supervision                Ambulation/Gait Ambulation/Gait assistance: Supervision Gait Distance (Feet): 500 Feet Assistive device: None Gait Pattern/deviations: WFL(Within Functional Limits) Gait velocity:  normal     General Gait Details: Normal gait pattern; did drift R/L with head turns but no LOB  Stairs            Wheelchair Mobility    Modified Rankin (Stroke Patients Only)       Balance Overall balance assessment: Needs assistance Sitting-balance support: No upper extremity supported Sitting balance-Leahy Scale: Normal     Standing balance support: No upper extremity supported Standing balance-Leahy Scale: Good                 High Level Balance Comments: Pt did drift R/L with head turns but no LOB; able to step over object, stop, turn, and change speeds without LOB             Pertinent Vitals/Pain Pain Assessment Pain Assessment: 0-10 Pain Score: 1  Pain Location: generalized Pain Descriptors / Indicators: Discomfort Pain Intervention(s): Limited activity within patient's tolerance, Monitored during session    Home Living Family/patient expects to be discharged to:: Private residence Living Arrangements: Non-relatives/Friends Available Help at Discharge: Available PRN/intermittently Type of Home: Apartment Home Access: Stairs to enter Entrance Stairs-Rails: Psychiatric nurse of Steps: 1 flight   Home Layout: One level Home Equipment: None      Prior Function Prior Level of Function : Independent/Modified Independent;Working/employed;Driving                     Hand Dominance        Extremity/Trunk Assessment   Upper Extremity Assessment  Upper Extremity Assessment: Overall WFL for tasks assessed    Lower Extremity Assessment Lower Extremity Assessment: Overall WFL for tasks assessed    Cervical / Trunk Assessment Cervical / Trunk Assessment: Normal  Communication   Communication: No difficulties  Cognition Arousal/Alertness: Awake/alert Behavior During Therapy: WFL for tasks assessed/performed Overall Cognitive Status: Within Functional Limits for tasks assessed                                  General Comments: Pt agitated at end of session in regards to Presidential Election coverage that was on TV and Wifi not working on [employer] Comments  Noted orthostatic BP were negative earlier today.  While ambulating pt reports a little dizziness then described as a little faint.  Obtained VS when returned to room in standing and BP was 126/98 , HR 99 bpm, O2 sats 100%.  He reports symptoms improved in bed. Notified RN.   Performed vestibular screen: Smooth pursuit, gaze stabilization, and head turns all intact with no nystagmus or dizziness.  Pt denies dizziness when rolling in bed and did not have symptoms when returned to supine.      Exercises     Assessment/Plan    PT Assessment Patient does not need any further PT services  PT Problem List         PT Treatment Interventions      PT Goals (Current goals can be found in the Care Plan section)  Acute Rehab PT Goals Patient Stated Goal: go home PT Goal Formulation: All assessment and education complete, DC therapy    Frequency       Co-evaluation               AM-PAC PT "6 Clicks" Mobility  Outcome Measure Help needed turning from your back to your side while in a flat bed without using bedrails?: None Help needed moving from lying on your back to sitting on the side of a flat bed without using bedrails?: None Help needed moving to and from a bed to a chair (including a wheelchair)?: A Little Help needed standing up from a chair using your arms (e.g., wheelchair or bedside chair)?: A Little Help needed to walk in hospital room?: A Little Help needed climbing 3-5 steps with a railing? : A Little 6 Click Score: 20    End of Session Equipment Utilized During Treatment: Gait belt Activity Tolerance: Patient tolerated treatment well Patient left: in bed;with call bell/phone within reach;with SCD's reapplied Nurse Communication: Mobility status PT Visit Diagnosis: Other abnormalities of gait and  mobility (R26.89)    Time: 1713-1730 PT Time Calculation (min) (ACUTE ONLY): 17 min   Charges:   PT Evaluation $PT Eval Low Complexity: 1 Low          Gentry Pilson, PT Acute Rehab [facility] Rehab [phone]   Karlton Lemon 03/21/2022, 5:47 PM

## 2022-03-22 DIAGNOSIS — E1110 Type 2 diabetes mellitus with ketoacidosis without coma: Secondary | ICD-10-CM | POA: Diagnosis not present

## 2022-03-22 DIAGNOSIS — F1010 Alcohol abuse, uncomplicated: Secondary | ICD-10-CM | POA: Diagnosis not present

## 2022-03-22 DIAGNOSIS — N179 Acute kidney failure, unspecified: Secondary | ICD-10-CM | POA: Diagnosis not present

## 2022-03-22 DIAGNOSIS — K920 Hematemesis: Secondary | ICD-10-CM | POA: Diagnosis not present

## 2022-03-22 LAB — CBC
HCT: 29.2 % — ABNORMAL LOW (ref 39.0–52.0)
Hemoglobin: 9.8 g/dL — ABNORMAL LOW (ref 13.0–17.0)
MCH: 31.6 pg (ref 26.0–34.0)
MCHC: 33.6 g/dL (ref 30.0–36.0)
MCV: 94.2 fL (ref 80.0–100.0)
Platelets: 237 10*3/uL (ref 150–400)
RBC: 3.1 MIL/uL — ABNORMAL LOW (ref 4.22–5.81)
RDW: 12.4 % (ref 11.5–15.5)
WBC: 3.7 10*3/uL — ABNORMAL LOW (ref 4.0–10.5)
nRBC: 0 % (ref 0.0–0.2)

## 2022-03-22 LAB — GLUCOSE, CAPILLARY
Glucose-Capillary: 198 mg/dL — ABNORMAL HIGH (ref 70–99)
Glucose-Capillary: 241 mg/dL — ABNORMAL HIGH (ref 70–99)
Glucose-Capillary: 253 mg/dL — ABNORMAL HIGH (ref 70–99)

## 2022-03-22 LAB — BASIC METABOLIC PANEL
Anion gap: 8 (ref 5–15)
BUN: 20 mg/dL (ref 6–20)
CO2: 25 mmol/L (ref 22–32)
Calcium: 8.9 mg/dL (ref 8.9–10.3)
Chloride: 106 mmol/L (ref 98–111)
Creatinine, Ser: 1.49 mg/dL — ABNORMAL HIGH (ref 0.61–1.24)
GFR, Estimated: 55 mL/min — ABNORMAL LOW (ref >60)
Glucose, Bld: 194 mg/dL — ABNORMAL HIGH (ref 70–99)
Potassium: 3.9 mmol/L (ref 3.5–5.1)
Sodium: 139 mmol/L (ref 135–145)

## 2022-03-22 MED ORDER — VITAMIN B-1 100 MG PO TABS: 100.0000 mg | ORAL_TABLET | Freq: Every day | ORAL | Status: DC

## 2022-03-22 MED ORDER — GLIPIZIDE 10 MG PO TABS: 10.0000 mg | ORAL_TABLET | Freq: Two times a day (BID) | ORAL | 1 refills | Status: DC

## 2022-03-22 MED ORDER — TRESIBA FLEXTOUCH 200 UNIT/ML ~~LOC~~ SOPN: 10.0000 [IU] | PEN_INJECTOR | Freq: Every day | SUBCUTANEOUS | 0 refills | Status: DC

## 2022-03-22 MED ORDER — FOLIC ACID 1 MG PO TABS: 1.0000 mg | ORAL_TABLET | Freq: Every day | ORAL | Status: DC

## 2022-03-22 MED ORDER — ADULT MULTIVITAMIN W/MINERALS CH: 1.0000 | ORAL_TABLET | Freq: Every day | ORAL | Status: DC

## 2022-03-22 MED ORDER — METFORMIN HCL 1000 MG PO TABS: 1000.0000 mg | ORAL_TABLET | Freq: Two times a day (BID) | ORAL | 1 refills | Status: DC

## 2022-03-22 MED ORDER — PANTOPRAZOLE SODIUM 40 MG PO TBEC: 40.0000 mg | DELAYED_RELEASE_TABLET | Freq: Two times a day (BID) | ORAL | 1 refills | Status: DC

## 2022-03-22 MED ORDER — PRAVASTATIN SODIUM 10 MG PO TABS: 10.0000 mg | ORAL_TABLET | Freq: Every day | ORAL | 1 refills | Status: AC

## 2022-03-22 NOTE — Progress Notes (Signed)
OT Cancellation Note  Patient Details Name: Nathian Calahan MRN: [id number] DOB: 10-04-1965   Cancelled Treatment:    Reason Eval/Treat Not Completed: OT screened, no needs identified, will sign off Patient reported being independent in ADLs at this time and not needing skilled OT services. OT signing off at this time.  Rennie Plowman, MS Acute Rehabilitation Department Office# [phone]  03/22/2022, 11:35 AM

## 2022-03-22 NOTE — Progress Notes (Signed)
I attest to student documentation.  Ammie Ferrier, MSN-RN [place]

## 2022-03-22 NOTE — Discharge Summary (Signed)
Physician Discharge Summary  James Bradford [id number] DOB: 01-11-1966 DOA: 03/18/2022  PCP: [facility]  Admit date: 03/18/2022 Discharge date: 03/22/2022  Time spent: 60 minutes  Recommendations for Outpatient Follow-up:  Follow-up with PCP as scheduled on April 03, 2022.  On follow-up patient will need a basic metabolic profile, magnesium level done to follow-up on electrolytes and renal function.  Patient need a CBC done to follow-up on counts.  Patient's diabetes will need to be reassessed.  Patient's blood pressure also need to be followed up upon.   Discharge Diagnoses:  Principal Problem:   Diabetic ketoacidosis without coma associated with type 2 diabetes mellitus (HCC) Active Problems:   Generalized abdominal pain   Lactic acidosis   Coffee ground emesis   Alcohol abuse   Acute renal failure superimposed on stage 3a chronic kidney disease ([place])   Essential hypertension   AKI (acute kidney injury) ([place])   Discharge Condition: Stable and improved.  Diet recommendation: Carb modified diet  Filed Weights   03/18/22 1017 03/18/22 1422  Weight: 70.3 kg 68.6 kg    History of present illness:  HPI per Dr. Cyd Silence. 57 year old male with past medical history of hypertension, hyperlipidemia, marijuana use, alcohol abuse, diabetes mellitus type 2 who presented to [facility] emergency department with complaints of nausea vomiting and abdominal pain.   Patient claims that approximately 2 to 3 days ago he has been experiencing abdominal pain.  Abdominal pain is moderate to severe in intensity, generalized in location, sharp in quality and radiating diffusely.  Abdominal pain has been associated with intense nausea and frequent bouts of vomiting and inability to tolerate oral intake.  Patient describes his vomitus as occasionally coffee-ground in appearance.  Patient denies any associated melena.  Patient denies fevers, sick contacts, recent  travel or recent ingestion of undercooked food.   Upon further questioning patient additionally admits that for the past 2 months he has been out of his medications including his diabetic medications.  Due to progressively worsening symptoms patient eventually presented to [facility] emergency department for evaluation.   Upon evaluation in the emergency department patient was found to have acute kidney injury with creatinine of 2.27, severe hyperglycemia with blood sugar 490 and elevated anion gap of 22 all concerning for diabetic ketoacidosis with acute kidney injury.  Patient was additionally found to have a lactic acidosis.  Patient was initiated on intravenous fluids and insulin infusion and the hospitalist group was then called to assess the patient for admission to the hospital. Hospital Course:   Assessment and Plan: * Diabetic ketoacidosis without coma associated with type 2 diabetes mellitus ([place]) Likely secondary to 2 months of noncompliance with medications Urinalysis unremarkable, bland. Chest x-ray negative for any infiltrate.   Patient remained afebrile. Patient with no cardiac symptoms. Patient hydrated with IV fluids.  Anion gap closed, acidosis resolved. Beta hydroxybutyric acid trended down. Hemoglobin A1c 9.9. Compliance stressed to patient. Patient was transitioned from insulin drip to subcutaneous insulin, Semglee 10 units daily, SSI. CBG improved and controlled on long-acting Semglee 10 units daily, as well as SSI.   Tolerating carb modified diet.   Diabetic coordinator consulted and following.  Patient will be discharged home on Tresiba 10 units daily, metformin 1000 mg twice daily, glipizide 10 mg twice daily. Outpatient follow-up with PCP.  Generalized abdominal pain Patient presented with severe generalized abdominal pain accompanied by multiple bouts of vomiting Lipase mildly elevated, liver enzymes unremarkable, urinalysis unremarkable Abdominal  pain  improved with management of DKA.  CT abdomen and pelvis with no acute intra-abdominal process, mild thickening of distal esophagus, possible esophagitis.  Patient maintained on PPI. Patient improved clinically and be discharged home in stable and improved condition.  Lactic acidosis Notable lactic acidosis on chemistry likely secondary to volume depletion Improved with aggressive hydration.   Coffee ground emesis With patient's frequent bouts of nausea and vomiting over the past several days prior to admission, patient reports that several of these episodes were mixed with what sounds to be coffee-ground emesis Normal hemoglobin and hematocrit while here however these numbers are likely hemoconcentrated Suspect some degree of Mallory-Weiss tear eventually resulting in several bouts of coffee-ground emesis Patient with no further bouts of coffee-ground emesis. Patient maintained on a PPI.   Hemoglobin remained stable throughout the hospitalization/send by day of discharge hemoglobin was 9.8.   Outpatient follow-up.   Alcohol abuse Obtaining exact information from patient as to how much alcohol he actually drinks is difficult Initially, patient stated to admitting MD that he "only drinks socially" but then when asked again he reports drinking a pint of vodka daily in the days preceding his illness No significant withdrawal noted during this hospitalization. Patient placed on the Ativan withdrawal protocol.   -Patient had no significant withdrawal symptoms during the hospitalization.   Alcohol cessation stressed to patient.   Acute renal failure superimposed on stage 3a chronic kidney disease ([place]) Acute kidney injury superimposed on chronic kidney disease stage IIIa due to volume depletion. Urinalysis nitrite negative, leukocytes negative, negative protein. Renal function improved with hydration.   Patient noted to have been on ACE inhibitor prior to admission which was subsequently  held during the hospitalization due to soft blood pressure and will not be resumed on discharge until follow-up with PCP.     Essential hypertension Blood pressure initially soft during the hospitalization.   Antihypertensive medications held during the hospitalization.   Patient hydrated IV fluid with resolution of soft blood pressure.   Outpatient follow-up with PCP.          Procedures: CT abdomen and pelvis 03/18/2022 Chest x-ray 03/18/2022  Consultations: None  Discharge Exam: Vitals:   03/22/22 0720 03/22/22 1327  BP: 132/85 (!) 135/92  Pulse: 88 94  Resp: 18 16  Temp: (!) 97.4 F (36.3 C) (!) 97.4 F (36.3 C)  SpO2: 100% 100%    General: NAD. Cardiovascular: Regular rate and rhythm no murmurs rubs or gallops.  No JVD.  No lower extremity edema. Respiratory: Clear to auscultation bilaterally.  No wheezes, no crackles, no rhonchi.  Fair air movement.  Speaking in full sentences.  Discharge Instructions   Discharge Instructions     Diet Carb Modified   Complete by: As directed    Increase activity slowly   Complete by: As directed       Allergies as of 03/22/2022       Reactions   Ibuprofen Other (See Comments)   Hurts his stomach   Pork-derived Products    Religious beliefs   Aspirin Other (See Comments)   Stomach irritation        Medication List     STOP taking these medications    Accu-Chek Guide test strip Generic drug: glucose blood   Accu-Chek Softclix Lancets lancets   Basaglar KwikPen 100 UNIT/ML   blood glucose meter kit and supplies Kit   hydrALAZINE 25 MG tablet Commonly known as: APRESOLINE   Insulin Pen Needle 32G X 4 MM Misc  KRILL OIL PO   lisinopril 10 MG tablet Commonly known as: ZESTRIL   sucralfate 1 g tablet Commonly known as: Carafate       TAKE these medications    dicyclomine 20 MG tablet Commonly known as: BENTYL Take 1 tablet (20 mg total) by mouth 2 (two) times daily.   folic acid 1 MG  tablet Commonly known as: FOLVITE Take 1 tablet (1 mg total) by mouth daily. Start taking on: March 23, 2022   glipiZIDE 10 MG tablet Commonly known as: GLUCOTROL Take 1 tablet (10 mg total) by mouth 2 (two) times daily before a meal. What changed: how much to take   metFORMIN 1000 MG tablet Commonly known as: GLUCOPHAGE Take 1 tablet (1,000 mg total) by mouth 2 (two) times daily with a meal. What changed:  medication strength how much to take   multivitamin with minerals Tabs tablet Take 1 tablet by mouth daily. Start taking on: March 23, 2022   pantoprazole 40 MG tablet Commonly known as: PROTONIX Take 1 tablet (40 mg total) by mouth 2 (two) times daily before a meal.   pravastatin 10 MG tablet Commonly known as: PRAVACHOL Take 1 tablet (10 mg total) by mouth daily.   thiamine 100 MG tablet Commonly known as: Vitamin B-1 Take 1 tablet (100 mg total) by mouth daily. Start taking on: March 23, 2022   Tyler Aas FlexTouch 200 UNIT/ML FlexTouch Pen Generic drug: insulin degludec Inject 10 Units into the skin daily.       Allergies  Allergen Reactions   Ibuprofen Other (See Comments)    Hurts his stomach    Pork-Derived Products     Religious beliefs   Aspirin Other (See Comments)    Stomach irritation    Follow-up [place], [place]. Call.   Why: Please call or walk-in during business hours to establish primary care services. Contact information: [place] [address] [phone]         [facility]. Call.   Why: This is another option for primary care services. Please call to schedule appointment. Contact information: [place] [phone]        [facility] Follow up.   Why: This is another option for primary care services. Please call to schedule appointment. Contact information: [address], [place] [phone]        [facility]. Call.   Specialty: Internal Medicine Why: This is another option for primary care services. Please call to schedule appointment. Contact information: [place] [id number] [place] [phone]                 The results of significant diagnostics from this hospitalization (including imaging, microbiology, ancillary and laboratory) are listed below for reference.    Significant Diagnostic Studies: CT ABDOMEN PELVIS W CONTRAST  Result Date: 03/18/2022 CLINICAL DATA:  Abdominal pain, acute, nonlocalized. EXAM: CT ABDOMEN AND PELVIS WITH CONTRAST TECHNIQUE: Multidetector CT imaging of the abdomen and pelvis was performed using the standard protocol following bolus administration of intravenous contrast. RADIATION DOSE REDUCTION: This exam was performed according to the departmental dose-optimization program which includes automated exposure control, adjustment of the mA and/or kV according to patient size and/or use of iterative reconstruction technique. CONTRAST:  62m OMNIPAQUE IOHEXOL 300 MG/ML  SOLN COMPARISON:  12/09/2021. FINDINGS: Lower chest: No acute abnormality. Hepatobiliary:  No focal liver abnormality is seen. No gallstones, gallbladder wall thickening, or biliary dilatation. Pancreas: Unremarkable. No pancreatic ductal dilatation or surrounding inflammatory changes. Spleen: Normal in size without focal abnormality. Adrenals/Urinary Tract: The adrenal glands are within normal limits. Cysts are present in the kidneys bilaterally. Additional subcentimeter hypodensities are noted which are too small to further characterize. The kidneys enhance symmetrically. No renal calculus or hydronephrosis. The bladder is unremarkable. Stomach/Bowel: There is thickening of the walls of the distal esophagus. Stomach is within normal limits. Appendix is not seen. No evidence of bowel wall thickening,  distention, or inflammatory changes. No free air or pneumatosis. Vascular/Lymphatic: No significant vascular findings are present. No enlarged abdominal or pelvic lymph nodes. Reproductive: Prostate is unremarkable. Other: No abdominopelvic ascites. Musculoskeletal: No acute osseous abnormality. IMPRESSION: 1. No acute intra-abdominal process. 2. Mild thickening of the distal esophagus, possible esophagitis. Electronically Signed   By: Brett Fairy M.D.   On: 03/18/2022 23:26   DG Chest Port 1 View  Result Date: 03/18/2022 CLINICAL DATA:  DKA EXAM: PORTABLE CHEST - 1 VIEW COMPARISON:  08/23/2020 FINDINGS: Cardiac silhouette is unremarkable. No pneumothorax or pleural effusion. The lungs are clear. The visualized skeletal structures are unremarkable. IMPRESSION: No acute cardiopulmonary process. Electronically Signed   By: Sammie Bench M.D.   On: 03/18/2022 12:57    Microbiology: Recent Results (from the past 240 hour(s))  MRSA Next Gen by PCR, Nasal     Status: None   Collection Time: 03/18/22  2:32 PM   Specimen: Nasal Mucosa; Nasal Swab  Result Value Ref Range Status   MRSA by PCR Next Gen NOT DETECTED NOT DETECTED Final    Comment: (NOTE) The GeneXpert MRSA Assay (FDA approved for NASAL specimens only), is one component of a comprehensive MRSA colonization surveillance program. It is not intended to diagnose MRSA infection nor to guide or monitor treatment for MRSA infections. Test performance is not FDA approved in patients less than 75 years old. Performed at [facility], [place] [address], [place], [place] [address]      Labs: Basic Metabolic Panel: Recent Labs  Lab 03/19/22 0305 03/19/22 0717 03/20/22 0304 03/21/22 0555 03/22/22 0542  NA 133* 134* 138 138 139  K 4.1 3.4* 3.7 3.8 3.9  CL 99 99 107 105 106  CO2 21* 22 23 24 25  $ GLUCOSE 189* 124* 80 119* 194*  BUN 36* 37* 21* 21* 20  CREATININE 1.91* 1.96* 1.43* 1.36* 1.49*  CALCIUM 9.3 9.3 8.9 8.7*  8.9   Liver Function Tests: Recent Labs  Lab 03/18/22 1057 03/19/22 0717  AST 27  --   ALT 25  --   ALKPHOS 103  --   BILITOT 1.2  --   PROT 10.7*  --   ALBUMIN 6.0* 3.7   Recent Labs  Lab 03/18/22 1057  LIPASE 65*   No results for input(s): "AMMONIA" in the last 168 hours. CBC: Recent Labs  Lab 03/18/22 1057 03/20/22 0304 03/21/22 0555 03/22/22 0542  WBC 13.7* 5.3 3.8* 3.7*  NEUTROABS 12.2* 2.4  --   --   HGB 15.4 11.9* 10.3* 9.8*  HCT 46.1 36.2* 31.6* 29.2*  MCV 92.6 94.0 94.0 94.2  PLT 433* 304 253 237   Cardiac Enzymes: No results for input(s): "CKTOTAL", "CKMB", "CKMBINDEX", "TROPONINI" in the last 168 hours. BNP: BNP (last 3 results) No results for input(s): "BNP" in the last 8760 hours.  ProBNP (last 3 results) No results for input(s): "PROBNP" in the last  8760 hours.  CBG: Recent Labs  Lab 03/21/22 2004 03/21/22 2351 03/22/22 0734 03/22/22 1212 03/22/22 1329  GLUCAP 160* 191* 198* 241* 253*       Signed:  Irine Seal MD.  [facility] 03/22/2022, 3:13 PM

## 2022-03-22 NOTE — TOC Transition Note (Signed)
Transition of Care [facility]) - CM/SW Discharge Note   Patient Details  Name: James Bradford MRN: [id number] Date of Birth: 12-19-1965  [facility] CM/SW Contact:  Henrietta Dine, RN Phone Number: 03/22/2022, 3:52 PM   Clinical Narrative:    D/C orders received; bus pass provided; see TOC progress note dated 03/22/22; no TOC needs.   Final next level of care: Home/Self Care Barriers to Discharge: No Barriers Identified   Patient Goals and CMS Choice      Discharge Placement                         Discharge Plan and Services Additional resources added to the After Visit Summary for                                       Social Determinants of Health (SDOH) Interventions SDOH Screenings   Food Insecurity: No Food Insecurity (03/20/2022)  Housing: Medium Risk (03/20/2022)  Transportation Needs: Unmet Transportation Needs (03/20/2022)  Utilities: Not At Risk (03/20/2022)  Tobacco Use: Low Risk  (03/18/2022)     Readmission Risk Interventions    03/20/2022   11:05 AM 03/19/2022    9:53 AM 12/13/2021    1:26 PM  Readmission Risk Prevention Plan  Transportation Screening Complete Complete Complete  PCP or Specialist Appt within 3-5 Days Complete Complete Complete  HRI or Home Care Consult Complete Complete Complete  Social Work Consult for [place] Planning/Counseling Complete Complete Complete  Palliative Care Screening Not Applicable Not Applicable Not Applicable  Medication Review Press photographer) Complete Complete Complete

## 2022-03-22 NOTE — TOC Progression Note (Signed)
Transition of Care [facility]) - Progression Note    Patient Details  Name: James Bradford MRN: [id number] Date of Birth: November 29, 1965  Transition of Care [facility]) CM/SW Contact  Rodney Booze, LCSW Phone Number: 03/22/2022, 3:42 PM  Clinical Narrative:    CSW has given the nurse bus pass for this patient at this time there are no other TOC needs.         Expected Discharge Plan and Services         Expected Discharge Date: 03/22/22                                     Social Determinants of Health (SDOH) Interventions SDOH Screenings   Food Insecurity: No Food Insecurity (03/20/2022)  Housing: Medium Risk (03/20/2022)  Transportation Needs: Unmet Transportation Needs (03/20/2022)  Utilities: Not At Risk (03/20/2022)  Tobacco Use: Low Risk  (03/18/2022)    Readmission Risk Interventions    03/20/2022   11:05 AM 03/19/2022    9:53 AM 12/13/2021    1:26 PM  Readmission Risk Prevention Plan  Transportation Screening Complete Complete Complete  PCP or Specialist Appt within 3-5 Days Complete Complete Complete  HRI or Home Care Consult Complete Complete Complete  Social Work Consult for [place] Planning/Counseling Complete Complete Complete  Palliative Care Screening Not Applicable Not Applicable Not Applicable  Medication Review Press photographer) Complete Complete Complete

## 2022-05-30 ENCOUNTER — Encounter (HOSPITAL_COMMUNITY): Payer: Self-pay

## 2022-05-30 ENCOUNTER — Other Ambulatory Visit: Payer: Self-pay

## 2022-05-30 ENCOUNTER — Emergency Department (HOSPITAL_COMMUNITY)
Admission: EM | Admit: 2022-05-30 | Discharge: 2022-05-30 | Disposition: A | Discharge status: Home / Self Care | Payer: BLUE CROSS/BLUE SHIELD | Attending: Emergency Medicine | Admitting: Emergency Medicine

## 2022-05-30 DIAGNOSIS — Z7984 Long term (current) use of oral hypoglycemic drugs: Secondary | ICD-10-CM | POA: Diagnosis not present

## 2022-05-30 DIAGNOSIS — Z794 Long term (current) use of insulin: Secondary | ICD-10-CM | POA: Insufficient documentation

## 2022-05-30 DIAGNOSIS — I10 Essential (primary) hypertension: Secondary | ICD-10-CM | POA: Insufficient documentation

## 2022-05-30 DIAGNOSIS — R109 Unspecified abdominal pain: Secondary | ICD-10-CM | POA: Insufficient documentation

## 2022-05-30 DIAGNOSIS — R739 Hyperglycemia, unspecified: Secondary | ICD-10-CM

## 2022-05-30 DIAGNOSIS — E1165 Type 2 diabetes mellitus with hyperglycemia: Secondary | ICD-10-CM | POA: Insufficient documentation

## 2022-05-30 DIAGNOSIS — R112 Nausea with vomiting, unspecified: Secondary | ICD-10-CM | POA: Diagnosis not present

## 2022-05-30 LAB — CBC WITH DIFFERENTIAL/PLATELET
Abs Immature Granulocytes: 0.04 10*3/uL (ref 0.00–0.07)
Basophils Absolute: 0 10*3/uL (ref 0.0–0.1)
Basophils Relative: 0 %
Eosinophils Absolute: 0 10*3/uL (ref 0.0–0.5)
Eosinophils Relative: 0 %
HCT: 37.8 % — ABNORMAL LOW (ref 39.0–52.0)
Hemoglobin: 12.1 g/dL — ABNORMAL LOW (ref 13.0–17.0)
Immature Granulocytes: 1 %
Lymphocytes Relative: 16 %
Lymphs Abs: 1.2 10*3/uL (ref 0.7–4.0)
MCH: 30.9 pg (ref 26.0–34.0)
MCHC: 32 g/dL (ref 30.0–36.0)
MCV: 96.7 fL (ref 80.0–100.0)
Monocytes Absolute: 0.4 10*3/uL (ref 0.1–1.0)
Monocytes Relative: 5 %
Neutro Abs: 5.9 10*3/uL (ref 1.7–7.7)
Neutrophils Relative %: 78 %
Platelets: 271 10*3/uL (ref 150–400)
RBC: 3.91 MIL/uL — ABNORMAL LOW (ref 4.22–5.81)
RDW: 14.1 % (ref 11.5–15.5)
WBC: 7.5 10*3/uL (ref 4.0–10.5)
nRBC: 0 % (ref 0.0–0.2)

## 2022-05-30 LAB — COMPREHENSIVE METABOLIC PANEL
ALT: 21 U/L (ref 0–44)
AST: 22 U/L (ref 15–41)
Albumin: 4.4 g/dL (ref 3.5–5.0)
Alkaline Phosphatase: 65 U/L (ref 38–126)
Anion gap: 14 (ref 5–15)
BUN: 24 mg/dL — ABNORMAL HIGH (ref 6–20)
CO2: 17 mmol/L — ABNORMAL LOW (ref 22–32)
Calcium: 9.6 mg/dL (ref 8.9–10.3)
Chloride: 108 mmol/L (ref 98–111)
Creatinine, Ser: 1.56 mg/dL — ABNORMAL HIGH (ref 0.61–1.24)
GFR, Estimated: 52 mL/min — ABNORMAL LOW (ref >60)
Glucose, Bld: 232 mg/dL — ABNORMAL HIGH (ref 70–99)
Potassium: 3.9 mmol/L (ref 3.5–5.1)
Sodium: 139 mmol/L (ref 135–145)
Total Bilirubin: 0.8 mg/dL (ref 0.3–1.2)
Total Protein: 7.5 g/dL (ref 6.5–8.1)

## 2022-05-30 LAB — BLOOD GAS, VENOUS
Acid-base deficit: 5.9 mmol/L — ABNORMAL HIGH (ref 0.0–2.0)
Bicarbonate: 21.1 mmol/L (ref 20.0–28.0)
O2 Saturation: 23.2 %
Patient temperature: 37
pCO2, Ven: 46 mmHg (ref 44–60)
pH, Ven: 7.27 (ref 7.25–7.43)
pO2, Ven: <31 mmHg — CL (ref 32–45)

## 2022-05-30 LAB — CBG MONITORING, ED: Glucose-Capillary: 182 mg/dL — ABNORMAL HIGH (ref 70–99)

## 2022-05-30 LAB — BETA-HYDROXYBUTYRIC ACID: Beta-Hydroxybutyric Acid: 1.95 mmol/L — ABNORMAL HIGH (ref 0.05–0.27)

## 2022-05-30 MED ORDER — LACTATED RINGERS IV BOLUS
1000.0000 mL | Freq: Once | INTRAVENOUS | Status: AC
  Administered 2022-05-30: 1000 mL via INTRAVENOUS

## 2022-05-30 MED ORDER — METOCLOPRAMIDE HCL 5 MG/ML IJ SOLN
10.0000 mg | Freq: Once | INTRAMUSCULAR | Status: AC
  Administered 2022-05-30: 10 mg via INTRAVENOUS
  Filled 2022-05-30: qty 2

## 2022-05-30 MED ORDER — ONDANSETRON HCL 4 MG/2ML IJ SOLN
4.0000 mg | Freq: Once | INTRAMUSCULAR | Status: AC
  Administered 2022-05-30: 4 mg via INTRAVENOUS
  Filled 2022-05-30: qty 2

## 2022-05-30 MED ORDER — PROCHLORPERAZINE EDISYLATE 10 MG/2ML IJ SOLN
10.0000 mg | Freq: Once | INTRAMUSCULAR | Status: AC
  Administered 2022-05-30: 10 mg via INTRAVENOUS
  Filled 2022-05-30: qty 2

## 2022-05-30 MED ORDER — ONDANSETRON HCL 4 MG PO TABS: 4.0000 mg | ORAL_TABLET | Freq: Three times a day (TID) | ORAL | 0 refills | Status: DC | PRN

## 2022-05-30 MED ORDER — HYDROMORPHONE HCL 1 MG/ML IJ SOLN
1.0000 mg | Freq: Once | INTRAMUSCULAR | Status: AC
  Administered 2022-05-30: 1 mg via INTRAVENOUS
  Filled 2022-05-30: qty 1

## 2022-05-30 NOTE — ED Provider Notes (Signed)
[place] EMERGENCY DEPARTMENT AT [facility] Provider Note   CSN: [id number] Arrival date & time: 05/30/22  1158     History  Chief Complaint  Patient presents with   Nausea   Emesis    James Bradford is a 57 y.o. male.   Emesis    Patient is a 57 year old male presenting by EMS to the ED due to nausea and vomiting.  Patient medical history is notable for poorly controlled diabetes on insulin, hypertension, hyperlipidemia, alcohol and marijuana abuse.  Patient states this morning he woke up feeling nauseated and has been vomiting.  He has been out of his diabetic medicine for the past week.  He is having some epigastric pain without radiation elsewhere, there is no chest pain or shortness of breath.  Home Medications Prior to Admission medications   Medication Sig Start Date End Date Taking? Authorizing Provider  ondansetron (ZOFRAN) 4 MG tablet Take 1 tablet (4 mg total) by mouth every 8 (eight) hours as needed for nausea or vomiting. 05/30/22  Yes Theron Arista, PA-C  dicyclomine (BENTYL) 20 MG tablet Take 1 tablet (20 mg total) by mouth 2 (two) times daily. 12/25/19   Moshe Cipro, NP  folic acid (FOLVITE) 1 MG tablet Take 1 tablet (1 mg total) by mouth daily. 03/23/22   Rodolph Bong, MD  glipiZIDE (GLUCOTROL) 10 MG tablet Take 1 tablet (10 mg total) by mouth 2 (two) times daily before a meal. 03/22/22   Rodolph Bong, MD  insulin degludec (TRESIBA FLEXTOUCH) 200 UNIT/ML FlexTouch Pen Inject 10 Units into the skin daily. 03/22/22   Rodolph Bong, MD  metFORMIN (GLUCOPHAGE) 1000 MG tablet Take 1 tablet (1,000 mg total) by mouth 2 (two) times daily with a meal. 03/22/22   Rodolph Bong, MD  Multiple Vitamin (MULTIVITAMIN WITH MINERALS) TABS tablet Take 1 tablet by mouth daily. 03/23/22   Rodolph Bong, MD  pantoprazole (PROTONIX) 40 MG tablet Take 1 tablet (40 mg total) by mouth 2 (two) times daily before a meal. 03/22/22   Rodolph Bong, MD   pravastatin (PRAVACHOL) 10 MG tablet Take 1 tablet (10 mg total) by mouth daily. 03/22/22   Rodolph Bong, MD  thiamine (VITAMIN B-1) 100 MG tablet Take 1 tablet (100 mg total) by mouth daily. 03/23/22   Rodolph Bong, MD      Allergies    Ibuprofen, Pork-derived products, and Aspirin    Review of Systems   Review of Systems  Gastrointestinal:  Positive for vomiting.    Physical Exam Updated Vital Signs BP (!) 111/57   Pulse 76   Temp 97.9 F (36.6 C) (Oral)   Resp 18   Ht 5\' 7"  (1.702 m)   Wt 68.5 kg   SpO2 100%   BMI 23.65 kg/m  Physical Exam Vitals and nursing note reviewed. Exam conducted with a chaperone present.  Constitutional:      Appearance: Normal appearance.  HENT:     Head: Normocephalic and atraumatic.  Eyes:     General: No scleral icterus.       Right eye: No discharge.        Left eye: No discharge.     Extraocular Movements: Extraocular movements intact.     Pupils: Pupils are equal, round, and reactive to light.  Cardiovascular:     Rate and Rhythm: Regular rhythm. Bradycardia present.     Pulses: Normal pulses.     Heart sounds: Normal heart sounds.  No friction rub. No gallop.  Pulmonary:     Effort: Pulmonary effort is normal.     Breath sounds: Normal breath sounds.  Abdominal:     General: Abdomen is flat. Bowel sounds are normal. There is no distension.     Palpations: Abdomen is soft.     Tenderness: There is abdominal tenderness.     Comments: Not epigastric tenderness without rigidity or guarding  Skin:    General: Skin is warm and dry.     Coloration: Skin is not jaundiced.  Neurological:     Mental Status: He is alert. Mental status is at baseline.     Coordination: Coordination normal.     ED Results / Procedures / Treatments   Labs (all labs ordered are listed, but only abnormal results are displayed) Labs Reviewed  COMPREHENSIVE METABOLIC PANEL - Abnormal; Notable for the following components:      Result  Value   CO2 17 (*)    Glucose, Bld 232 (*)    BUN 24 (*)    Creatinine, Ser 1.56 (*)    GFR, Estimated 52 (*)    All other components within normal limits  CBC WITH DIFFERENTIAL/PLATELET - Abnormal; Notable for the following components:   RBC 3.91 (*)    Hemoglobin 12.1 (*)    HCT 37.8 (*)    All other components within normal limits  BLOOD GAS, VENOUS - Abnormal; Notable for the following components:   pO2, Ven <31 (*)    Acid-base deficit 5.9 (*)    All other components within normal limits  BETA-HYDROXYBUTYRIC ACID - Abnormal; Notable for the following components:   Beta-Hydroxybutyric Acid 1.95 (*)    All other components within normal limits  CBG MONITORING, ED - Abnormal; Notable for the following components:   Glucose-Capillary 182 (*)    All other components within normal limits    EKG EKG Interpretation  Date/Time:  Friday May 30 2022 13:08:17 EDT Ventricular Rate:  49 PR Interval:  147 QRS Duration: 85 QT Interval:  452 QTC Calculation: 408 R Axis:   38 Text Interpretation: Sinus bradycardia Abnormal ECG Confirmed by Gerhard Munch [phone]) on 05/30/2022 1:21:13 PM  Radiology No results found.  Procedures Procedures    Medications Ordered in ED Medications  ondansetron (ZOFRAN) injection 4 mg (4 mg Intravenous Given 05/30/22 1255)  lactated ringers bolus 1,000 mL (0 mLs Intravenous Stopped 05/30/22 1400)  metoCLOPramide (REGLAN) injection 10 mg (10 mg Intravenous Given 05/30/22 1336)  lactated ringers bolus 1,000 mL (0 mLs Intravenous Stopped 05/30/22 1612)  HYDROmorphone (DILAUDID) injection 1 mg (1 mg Intravenous Given 05/30/22 1409)  prochlorperazine (COMPAZINE) injection 10 mg (10 mg Intravenous Given 05/30/22 1518)    ED Course/ Medical Decision Making/ A&P                             Medical Decision Making Amount and/or Complexity of Data Reviewed Labs: ordered.  Risk Prescription drug management.   This is a 57 year old male with history  of diabetes, hypertension, hyperlipidemia, marijuana abuse presenting to the emergency department due to nausea, vomiting and epigastric pain.  Differential includes but not limited to DKA, dehydration, AKI, electrolyte derangement, HHS, potential diabetes/gastroparesis, cyclic vomiting, pancreatitis, atypical ACS although less likely.  Will start with labs, EKG, fluids and antiemetics and reassess.  EKG shows sinus bradycardia without any signs of hyperkalemia.  On cardiac monitoring he is in sinus bradycardia.  No leukocytosis, mild  anemia with hemoglobin of 12.1.  CMP without gross electrolyte derangement, bicarb slightly low at 17, AKI with a creatinine 1.56 significantly changed compared to previous per chart review.  No transaminitis, no anion gap acidosis.  VBG without acidosis, pH 7.27.  Beta hydroxybutyric acid is mildly elevated.  Clinically patient is not in DKA or HHS.  There is no significant electrolyte derangement patient's creatinine is roughly at baseline although impaired at 1.56.  Patient has had 2 LR, antiemetics.  Patient is feeling improved, tolerating p.o.  Recommended continuing home medications and close outpatient follow-up with primary and GI.  Patient is stable for discharge at this time.        Final Clinical Impression(s) / ED Diagnoses Final diagnoses:  Nausea and vomiting, unspecified vomiting type  Hyperglycemia    Rx / DC Orders ED Discharge Orders          Ordered    ondansetron (ZOFRAN) 4 MG tablet  Every 8 hours PRN        05/30/22 1550              Theron Arista, PA-C 05/30/22 2133    Gerhard Munch, MD 06/01/22 [phone]

## 2022-05-30 NOTE — ED Triage Notes (Signed)
Patient brought in by EMS due to nausea and vomiting. Reports history of Type 2 diabetes and states he has been in DKA before and this feels similar. Patient diaphoretic on initial assessment and currently vomiting.

## 2022-05-30 NOTE — Discharge Instructions (Signed)
Workup today was reassuring, drink plenty of fluids, take your home medicine for diabetes as prescribed.  Take Zofran as needed for nausea.  Return to the ED for new or worsening or concerning symptoms.

## 2022-06-17 ENCOUNTER — Encounter (HOSPITAL_COMMUNITY): Payer: Self-pay | Admitting: Internal Medicine

## 2022-06-17 ENCOUNTER — Emergency Department (HOSPITAL_COMMUNITY): Payer: BLUE CROSS/BLUE SHIELD

## 2022-06-17 ENCOUNTER — Other Ambulatory Visit (HOSPITAL_COMMUNITY): Payer: Self-pay

## 2022-06-17 ENCOUNTER — Other Ambulatory Visit: Payer: Self-pay

## 2022-06-17 ENCOUNTER — Observation Stay (HOSPITAL_COMMUNITY)
Admission: EM | Admit: 2022-06-17 | Discharge: 2022-06-18 | Discharge status: Against Medical Advice | Payer: BLUE CROSS/BLUE SHIELD | Attending: Internal Medicine | Admitting: Internal Medicine

## 2022-06-17 ENCOUNTER — Observation Stay (HOSPITAL_COMMUNITY): Payer: BLUE CROSS/BLUE SHIELD

## 2022-06-17 DIAGNOSIS — Z794 Long term (current) use of insulin: Secondary | ICD-10-CM | POA: Insufficient documentation

## 2022-06-17 DIAGNOSIS — Z7984 Long term (current) use of oral hypoglycemic drugs: Secondary | ICD-10-CM | POA: Insufficient documentation

## 2022-06-17 DIAGNOSIS — E1122 Type 2 diabetes mellitus with diabetic chronic kidney disease: Secondary | ICD-10-CM | POA: Diagnosis not present

## 2022-06-17 DIAGNOSIS — N179 Acute kidney failure, unspecified: Principal | ICD-10-CM | POA: Insufficient documentation

## 2022-06-17 DIAGNOSIS — E1159 Type 2 diabetes mellitus with other circulatory complications: Secondary | ICD-10-CM | POA: Diagnosis present

## 2022-06-17 DIAGNOSIS — E875 Hyperkalemia: Secondary | ICD-10-CM | POA: Diagnosis not present

## 2022-06-17 DIAGNOSIS — N189 Chronic kidney disease, unspecified: Secondary | ICD-10-CM | POA: Diagnosis not present

## 2022-06-17 DIAGNOSIS — E1169 Type 2 diabetes mellitus with other specified complication: Secondary | ICD-10-CM | POA: Diagnosis not present

## 2022-06-17 DIAGNOSIS — Z79899 Other long term (current) drug therapy: Secondary | ICD-10-CM | POA: Diagnosis not present

## 2022-06-17 DIAGNOSIS — E119 Type 2 diabetes mellitus without complications: Secondary | ICD-10-CM

## 2022-06-17 DIAGNOSIS — R112 Nausea with vomiting, unspecified: Secondary | ICD-10-CM | POA: Diagnosis present

## 2022-06-17 DIAGNOSIS — E860 Dehydration: Secondary | ICD-10-CM | POA: Diagnosis not present

## 2022-06-17 DIAGNOSIS — I152 Hypertension secondary to endocrine disorders: Secondary | ICD-10-CM | POA: Diagnosis present

## 2022-06-17 DIAGNOSIS — E785 Hyperlipidemia, unspecified: Secondary | ICD-10-CM | POA: Diagnosis present

## 2022-06-17 DIAGNOSIS — N1831 Chronic kidney disease, stage 3a: Secondary | ICD-10-CM | POA: Diagnosis not present

## 2022-06-17 LAB — CBC
HCT: 39.5 % (ref 39.0–52.0)
Hemoglobin: 13 g/dL (ref 13.0–17.0)
MCH: 31.3 pg (ref 26.0–34.0)
MCHC: 32.9 g/dL (ref 30.0–36.0)
MCV: 95 fL (ref 80.0–100.0)
Platelets: 283 10*3/uL (ref 150–400)
RBC: 4.16 MIL/uL — ABNORMAL LOW (ref 4.22–5.81)
RDW: 14 % (ref 11.5–15.5)
WBC: 5.7 10*3/uL (ref 4.0–10.5)
nRBC: 0 % (ref 0.0–0.2)

## 2022-06-17 LAB — URINALYSIS, ROUTINE W REFLEX MICROSCOPIC
Bacteria, UA: NONE SEEN
Bilirubin Urine: NEGATIVE
Glucose, UA: 50 mg/dL — AB
Hgb urine dipstick: NEGATIVE
Ketones, ur: 20 mg/dL — AB
Leukocytes,Ua: NEGATIVE
Nitrite: NEGATIVE
Protein, ur: 100 mg/dL — AB
Specific Gravity, Urine: 1.016 (ref 1.005–1.030)
pH: 5 (ref 5.0–8.0)

## 2022-06-17 LAB — MAGNESIUM: Magnesium: 1.9 mg/dL (ref 1.7–2.4)

## 2022-06-17 LAB — I-STAT VENOUS BLOOD GAS, ED
Acid-base deficit: 7 mmol/L — ABNORMAL HIGH (ref 0.0–2.0)
Bicarbonate: 18 mmol/L — ABNORMAL LOW (ref 20.0–28.0)
Calcium, Ion: 1.16 mmol/L (ref 1.15–1.40)
HCT: 40 % (ref 39.0–52.0)
Hemoglobin: 13.6 g/dL (ref 13.0–17.0)
O2 Saturation: 85 %
Potassium: 5.2 mmol/L — ABNORMAL HIGH (ref 3.5–5.1)
Sodium: 131 mmol/L — ABNORMAL LOW (ref 135–145)
TCO2: 19 mmol/L — ABNORMAL LOW (ref 22–32)
pCO2, Ven: 35.3 mmHg — ABNORMAL LOW (ref 44–60)
pH, Ven: 7.314 (ref 7.25–7.43)
pO2, Ven: 54 mmHg — ABNORMAL HIGH (ref 32–45)

## 2022-06-17 LAB — COMPREHENSIVE METABOLIC PANEL
ALT: 28 U/L (ref 0–44)
AST: 41 U/L (ref 15–41)
Albumin: 4.7 g/dL (ref 3.5–5.0)
Alkaline Phosphatase: 58 U/L (ref 38–126)
Anion gap: 15 (ref 5–15)
BUN: 42 mg/dL — ABNORMAL HIGH (ref 6–20)
CO2: 18 mmol/L — ABNORMAL LOW (ref 22–32)
Calcium: 9.5 mg/dL (ref 8.9–10.3)
Chloride: 100 mmol/L (ref 98–111)
Creatinine, Ser: 2.3 mg/dL — ABNORMAL HIGH (ref 0.61–1.24)
GFR, Estimated: 33 mL/min — ABNORMAL LOW (ref >60)
Glucose, Bld: 158 mg/dL — ABNORMAL HIGH (ref 70–99)
Potassium: 5.7 mmol/L — ABNORMAL HIGH (ref 3.5–5.1)
Sodium: 133 mmol/L — ABNORMAL LOW (ref 135–145)
Total Bilirubin: 0.8 mg/dL (ref 0.3–1.2)
Total Protein: 7.9 g/dL (ref 6.5–8.1)

## 2022-06-17 LAB — LIPASE, BLOOD: Lipase: 44 U/L (ref 11–51)

## 2022-06-17 LAB — BETA-HYDROXYBUTYRIC ACID: Beta-Hydroxybutyric Acid: 1.98 mmol/L — ABNORMAL HIGH (ref 0.05–0.27)

## 2022-06-17 LAB — GLUCOSE, CAPILLARY: Glucose-Capillary: 156 mg/dL — ABNORMAL HIGH (ref 70–99)

## 2022-06-17 LAB — CBG MONITORING, ED: Glucose-Capillary: 155 mg/dL — ABNORMAL HIGH (ref 70–99)

## 2022-06-17 MED ORDER — SODIUM CHLORIDE 0.9% FLUSH
3.0000 mL | Freq: Two times a day (BID) | INTRAVENOUS | Status: DC
  Administered 2022-06-17: 3 mL via INTRAVENOUS

## 2022-06-17 MED ORDER — SODIUM CHLORIDE 0.9 % IV BOLUS
1000.0000 mL | Freq: Once | INTRAVENOUS | Status: AC
  Administered 2022-06-17: 1000 mL via INTRAVENOUS

## 2022-06-17 MED ORDER — ONDANSETRON HCL 4 MG/2ML IJ SOLN
4.0000 mg | Freq: Once | INTRAMUSCULAR | Status: AC
  Administered 2022-06-17: 4 mg via INTRAVENOUS
  Filled 2022-06-17: qty 2

## 2022-06-17 MED ORDER — ONDANSETRON HCL 4 MG/2ML IJ SOLN: 4.0000 mg | Freq: Four times a day (QID) | INTRAMUSCULAR | Status: DC | PRN

## 2022-06-17 MED ORDER — INSULIN ASPART 100 UNIT/ML IJ SOLN: 0.0000 [IU] | Freq: Every day | INTRAMUSCULAR | Status: DC

## 2022-06-17 MED ORDER — ONDANSETRON HCL 4 MG PO TABS: 4.0000 mg | ORAL_TABLET | Freq: Four times a day (QID) | ORAL | Status: DC | PRN

## 2022-06-17 MED ORDER — SODIUM ZIRCONIUM CYCLOSILICATE 10 G PO PACK
10.0000 g | PACK | Freq: Once | ORAL | Status: AC
  Administered 2022-06-17: 10 g via ORAL
  Filled 2022-06-17: qty 1

## 2022-06-17 MED ORDER — PANTOPRAZOLE SODIUM 40 MG PO TBEC
40.0000 mg | DELAYED_RELEASE_TABLET | Freq: Every day | ORAL | Status: DC
  Administered 2022-06-17: 40 mg via ORAL
  Filled 2022-06-17: qty 1

## 2022-06-17 MED ORDER — PRAVASTATIN SODIUM 10 MG PO TABS
10.0000 mg | ORAL_TABLET | Freq: Every day | ORAL | Status: DC
  Administered 2022-06-17: 10 mg via ORAL
  Filled 2022-06-17: qty 1

## 2022-06-17 MED ORDER — ACETAMINOPHEN 650 MG RE SUPP: 650.0000 mg | Freq: Four times a day (QID) | RECTAL | Status: DC | PRN

## 2022-06-17 MED ORDER — SENNOSIDES-DOCUSATE SODIUM 8.6-50 MG PO TABS: 1.0000 | ORAL_TABLET | Freq: Every evening | ORAL | Status: DC | PRN

## 2022-06-17 MED ORDER — MELATONIN 3 MG PO TABS
3.0000 mg | ORAL_TABLET | Freq: Every evening | ORAL | Status: DC | PRN
  Administered 2022-06-17: 3 mg via ORAL
  Filled 2022-06-17: qty 1

## 2022-06-17 MED ORDER — ALUM & MAG HYDROXIDE-SIMETH 200-200-20 MG/5ML PO SUSP: 30.0000 mL | Freq: Four times a day (QID) | ORAL | Status: DC | PRN

## 2022-06-17 MED ORDER — ACETAMINOPHEN 325 MG PO TABS: 650.0000 mg | ORAL_TABLET | Freq: Four times a day (QID) | ORAL | Status: DC | PRN

## 2022-06-17 MED ORDER — SODIUM CHLORIDE 0.9 % IV SOLN: INTRAVENOUS | Status: AC

## 2022-06-17 MED ORDER — INSULIN ASPART 100 UNIT/ML IJ SOLN: 0.0000 [IU] | Freq: Three times a day (TID) | INTRAMUSCULAR | Status: DC

## 2022-06-17 NOTE — Assessment & Plan Note (Signed)
BP stable.  Holding lisinopril as above.

## 2022-06-17 NOTE — ED Triage Notes (Signed)
Pt c/o abd pain and N/V for two days. Pt states he has T2DM and is on metformin since 2007. Pt denies polydipsia and polyuria.

## 2022-06-17 NOTE — Assessment & Plan Note (Signed)
In setting of AKI.  Continue IV fluids, give Lokelma.  Repeat labs in AM.

## 2022-06-17 NOTE — Hospital Course (Signed)
James Bradford is a 57 y.o. male with medical history significant for insulin-dependent T2DM, HTN, CKD stage IIIa, and HLD who is admitted with AKI on CKD stage IIIa with hyperkalemia in setting of nausea and vomiting.

## 2022-06-17 NOTE — Assessment & Plan Note (Signed)
Continue pravastatin 

## 2022-06-17 NOTE — Assessment & Plan Note (Signed)
Likely prerenal from hypovolemia due to GI losses. -Continue IV fluid hydration overnight -Repeat labs in a.m. -Hold metformin and lisinopril

## 2022-06-17 NOTE — Assessment & Plan Note (Signed)
Unclear etiology.  He has had some epigastric pain.  Lipase is normal.  History of GERD/gastritis. -Obtain KUB -Continue antiemetics as needed -IV fluid hydration overnight -Advance diet as tolerated -Continue oral Protonix

## 2022-06-17 NOTE — Assessment & Plan Note (Signed)
No evidence of DKA/HHS.  Hold metformin and glipizide.  Placed on SSI.

## 2022-06-17 NOTE — ED Notes (Signed)
ED TO INPATIENT HANDOFF REPORT  ED Nurse Name and Phone #: Raquel Sarna   S Name/Age/Gender James Bradford 57 y.o. male Room/Bed: RESUSC/RESUSC  Code Status   Code Status: Prior  Home/SNF/Other Home Patient oriented to: self, place, time, and situation Is this baseline? Yes   Triage Complete: Triage complete  Chief Complaint Acute kidney injury superimposed on chronic kidney disease (HCC) [N17.9, N18.9]  Triage Note Pt c/o abd pain and N/V for two days. Pt states he has T2DM and is on metformin since 2007. Pt denies polydipsia and polyuria.    Allergies Allergies  Allergen Reactions   Ibuprofen Other (See Comments)    Hurts his stomach    Pork-Derived Products     Religious beliefs   Aspirin Other (See Comments)    Stomach irritation    Level of Care/Admitting Diagnosis ED Disposition     ED Disposition  Admit   Condition  --   Comment  Hospital Area: [facility] [[id number]]  Level of Care: Telemetry Medical [104]  May place patient in observation at [facility] or [facility] if equivalent level of care is available:: No  Covid Evaluation: Asymptomatic - no recent exposure (last 10 days) testing not required  Diagnosis: Acute kidney injury superimposed on chronic kidney disease [facility]) [[id number]]  Admitting Physician: Charlsie Quest [[id number]]  Attending Physician: Charlsie Quest [[id number]]          B Medical/Surgery History Past Medical History:  Diagnosis Date   Aortic atherosclerosis (HCC) 08/24/2020   Hyperlipidemia    Hypertension    Marijuana abuse 09/28/2020   Sinus bradycardia    Stage 3 chronic kidney disease (HCC)    Type 2 diabetes mellitus (HCC)    Unspecified glaucoma 02/18/2008   Qualifier: Diagnosis of  By: Daphine Deutscher FNP, Zena Amos     Past Surgical History:  Procedure Laterality Date   HAND SURGERY     LEG SURGERY       A IV Location/Drains/Wounds Patient Lines/Drains/Airways Status     Active Line/Drains/Airways      Name Placement date Placement time Site Days   Peripheral IV 06/17/22 20 G Anterior;Left Forearm 06/17/22  1809  Forearm  less than 1            Intake/Output Last 24 hours  Intake/Output Summary (Last 24 hours) at 06/17/2022 2031 Last data filed at 06/17/2022 2009 Gross per 24 hour  Intake 1000 ml  Output --  Net 1000 ml    Labs/Imaging Results for orders placed or performed during the hospital encounter of 06/17/22 (from the past 48 hour(s))  CBG monitoring, ED     Status: Abnormal   Collection Time: 06/17/22  3:31 PM  Result Value Ref Range   Glucose-Capillary 155 (H) 70 - 99 mg/dL    Comment: Glucose reference range applies only to samples taken after fasting for at least 8 hours.  Lipase, blood     Status: None   Collection Time: 06/17/22  3:39 PM  Result Value Ref Range   Lipase 44 11 - 51 U/L    Comment: Performed at [facility], [address], [place], [place] [address]  Comprehensive metabolic panel     Status: Abnormal   Collection Time: 06/17/22  3:39 PM  Result Value Ref Range   Sodium 133 (L) 135 - 145 mmol/L   Potassium 5.7 (H) 3.5 - 5.1 mmol/L   Chloride 100 98 - 111 mmol/L   CO2 18 (L) 22 -  32 mmol/L   Glucose, Bld 158 (H) 70 - 99 mg/dL    Comment: Glucose reference range applies only to samples taken after fasting for at least 8 hours.   BUN 42 (H) 6 - 20 mg/dL   Creatinine, Ser 1.61 (H) 0.61 - 1.24 mg/dL   Calcium 9.5 8.9 - 09.6 mg/dL   Total Protein 7.9 6.5 - 8.1 g/dL   Albumin 4.7 3.5 - 5.0 g/dL   AST 41 15 - 41 U/L   ALT 28 0 - 44 U/L   Alkaline Phosphatase 58 38 - 126 U/L   Total Bilirubin 0.8 0.3 - 1.2 mg/dL   GFR, Estimated 33 (L) >60 mL/min    Comment: (NOTE) Calculated using the CKD-EPI Creatinine Equation (2021)    Anion gap 15 5 - 15    Comment: Performed at [facility], [address], [place], [place] [address]  CBC     Status: Abnormal   Collection Time: 06/17/22  3:39 PM  Result Value Ref Range   WBC 5.7 4.0 -  10.5 K/uL   RBC 4.16 (L) 4.22 - 5.81 MIL/uL   Hemoglobin 13.0 13.0 - 17.0 g/dL   HCT 98.1 19.1 - 47.8 %   MCV 95.0 80.0 - 100.0 fL   MCH 31.3 26.0 - 34.0 pg   MCHC 32.9 30.0 - 36.0 g/dL   RDW 29.5 62.1 - 30.8 %   Platelets 283 150 - 400 K/uL   nRBC 0.0 0.0 - 0.2 %    Comment: Performed at [facility], [address], [place], [place] [address]  Urinalysis, Routine w reflex microscopic -Urine, Clean Catch     Status: Abnormal   Collection Time: 06/17/22  5:47 PM  Result Value Ref Range   Color, Urine YELLOW YELLOW   APPearance HAZY (A) CLEAR   Specific Gravity, Urine 1.016 1.005 - 1.030   pH 5.0 5.0 - 8.0   Glucose, UA 50 (A) NEGATIVE mg/dL   Hgb urine dipstick NEGATIVE NEGATIVE   Bilirubin Urine NEGATIVE NEGATIVE   Ketones, ur 20 (A) NEGATIVE mg/dL   Protein, ur 696 (A) NEGATIVE mg/dL   Nitrite NEGATIVE NEGATIVE   Leukocytes,Ua NEGATIVE NEGATIVE   RBC / HPF 0-5 0 - 5 RBC/hpf   WBC, UA 0-5 0 - 5 WBC/hpf   Bacteria, UA NONE SEEN NONE SEEN   Squamous Epithelial / HPF 0-5 0 - 5 /HPF   Hyaline Casts, UA PRESENT     Comment: Performed at [facility], [address], [place], [place] [address]  Magnesium     Status: None   Collection Time: 06/17/22  6:12 PM  Result Value Ref Range   Magnesium 1.9 1.7 - 2.4 mg/dL    Comment: Performed at [facility], [address], [place], [place] [address]  Beta-hydroxybutyric acid     Status: Abnormal   Collection Time: 06/17/22  6:12 PM  Result Value Ref Range   Beta-Hydroxybutyric Acid 1.98 (H) 0.05 - 0.27 mmol/L    Comment: Performed at [facility], [address] [place], [place] [address]  I-Stat venous blood gas, [facility], [facility], [facility])     Status: Abnormal   Collection Time: 06/17/22  6:28 PM  Result Value Ref Range   pH, Ven 7.314 7.25 - 7.43   pCO2, Ven 35.3 (L) 44 - 60 mmHg   pO2, Ven 54 (H) 32 - 45 mmHg   Bicarbonate 18.0 (L) 20.0 - 28.0 mmol/L  TCO2 19 (L) 22 - 32 mmol/L   O2 Saturation 85 %    Acid-base deficit 7.0 (H) 0.0 - 2.0 mmol/L   Sodium 131 (L) 135 - 145 mmol/L   Potassium 5.2 (H) 3.5 - 5.1 mmol/L   Calcium, Ion 1.16 1.15 - 1.40 mmol/L   HCT 40.0 39.0 - 52.0 %   Hemoglobin 13.6 13.0 - 17.0 g/dL   Sample type VENOUS    DG Chest Portable 1 View  Result Date: 06/17/2022 CLINICAL DATA:  Nausea and vomiting for 2 days, initial encounter EXAM: PORTABLE CHEST 1 VIEW COMPARISON:  03/18/2022 FINDINGS: Cardiac shadow is within normal limits. The lungs are well aerated bilaterally. No focal infiltrate or effusion is seen. No bony abnormality is noted. IMPRESSION: No acute abnormality noted. Electronically Signed   By: Alcide Clever M.D.   On: 06/17/2022 19:27    Pending Labs Unresulted Labs (From admission, onward)     Start     Ordered   06/17/22 2026  Rapid urine drug screen (hospital performed)  Add-on,   AD        06/17/22 2026            Vitals/Pain Today's Vitals   06/17/22 1830 06/17/22 1845 06/17/22 2015 06/17/22 2016  BP: 109/80 (!) 133/93 114/61   Pulse: (!) 107 93 89   Resp:  (!) 22 12   Temp:    98.2 F (36.8 C)  TempSrc:    Oral  SpO2: 100% 100% 99%   PainSc:        Isolation Precautions No active isolations  Medications Medications  sodium chloride 0.9 % bolus 1,000 mL (0 mLs Intravenous Stopped 06/17/22 2009)  ondansetron (ZOFRAN) injection 4 mg (4 mg Intravenous Given 06/17/22 1811)  sodium chloride 0.9 % bolus 1,000 mL (1,000 mLs Intravenous New Bag/Given 06/17/22 2011)  ondansetron (ZOFRAN) injection 4 mg (4 mg Intravenous Given 06/17/22 2016)    Mobility walks     Focused Assessments   Pt with generalized abdominal pain, multiple episodes of nausea no vomiting   R Recommendations: See Admitting Provider Note  Report given to:   Additional Notes: Pt a/o x 4 able to ambulate without assistance

## 2022-06-17 NOTE — ED Provider Notes (Signed)
[place] EMERGENCY DEPARTMENT AT [facility] Provider Note   CSN: [id number] Arrival date & time: 06/17/22  1507     History  Chief Complaint  Patient presents with   Abdominal Pain    James Bradford is a 57 y.o. male.  Pt is a 57 yo male with pmhx significant for DM2, HTN, glaucoma, CKD, HLD, and marijuana abuse.  Pt said he's been having n/v and abd pain for 3 days.  Pt said his sister threw away his glucometer, so he has not been checking his blood sugars.         Home Medications Prior to Admission medications   Medication Sig Start Date End Date Taking? Authorizing Provider  dicyclomine (BENTYL) 20 MG tablet Take 1 tablet (20 mg total) by mouth 2 (two) times daily. 12/25/19   Moshe Cipro, NP  folic acid (FOLVITE) 1 MG tablet Take 1 tablet (1 mg total) by mouth daily. 03/23/22   Rodolph Bong, MD  glipiZIDE (GLUCOTROL) 10 MG tablet Take 1 tablet (10 mg total) by mouth 2 (two) times daily before a meal. 03/22/22   Rodolph Bong, MD  insulin degludec (TRESIBA FLEXTOUCH) 200 UNIT/ML FlexTouch Pen Inject 10 Units into the skin daily. 03/22/22   Rodolph Bong, MD  metFORMIN (GLUCOPHAGE) 1000 MG tablet Take 1 tablet (1,000 mg total) by mouth 2 (two) times daily with a meal. 03/22/22   Rodolph Bong, MD  Multiple Vitamin (MULTIVITAMIN WITH MINERALS) TABS tablet Take 1 tablet by mouth daily. 03/23/22   Rodolph Bong, MD  ondansetron (ZOFRAN) 4 MG tablet Take 1 tablet (4 mg total) by mouth every 8 (eight) hours as needed for nausea or vomiting. 05/30/22   Theron Arista, PA-C  pantoprazole (PROTONIX) 40 MG tablet Take 1 tablet (40 mg total) by mouth 2 (two) times daily before a meal. 03/22/22   Rodolph Bong, MD  pravastatin (PRAVACHOL) 10 MG tablet Take 1 tablet (10 mg total) by mouth daily. 03/22/22   Rodolph Bong, MD  thiamine (VITAMIN B-1) 100 MG tablet Take 1 tablet (100 mg total) by mouth daily. 03/23/22   Rodolph Bong, MD       Allergies    Ibuprofen, Pork-derived products, and Aspirin    Review of Systems   Review of Systems  Gastrointestinal:  Positive for abdominal pain, nausea and vomiting.  All other systems reviewed and are negative.   Physical Exam Updated Vital Signs BP 114/61   Pulse 89   Temp 98.2 F (36.8 C) (Oral)   Resp 12   SpO2 99%  Physical Exam Vitals and nursing note reviewed.  Constitutional:      Appearance: He is well-developed.  HENT:     Head: Normocephalic and atraumatic.     Mouth/Throat:     Mouth: Mucous membranes are dry.     Pharynx: Oropharynx is clear.  Eyes:     Extraocular Movements: Extraocular movements intact.     Pupils: Pupils are equal, round, and reactive to light.  Cardiovascular:     Rate and Rhythm: Regular rhythm. Tachycardia present.     Heart sounds: Normal heart sounds.  Pulmonary:     Effort: Pulmonary effort is normal.     Breath sounds: Normal breath sounds.  Abdominal:     General: Abdomen is flat. Bowel sounds are normal.     Palpations: Abdomen is soft.     Tenderness: There is generalized abdominal tenderness.  Skin:    General:  Skin is warm.     Capillary Refill: Capillary refill takes less than 2 seconds.  Neurological:     General: No focal deficit present.     Mental Status: He is alert and oriented to person, place, and time.  Psychiatric:        Mood and Affect: Mood normal.        Behavior: Behavior normal.     ED Results / Procedures / Treatments   Labs (all labs ordered are listed, but only abnormal results are displayed) Labs Reviewed  COMPREHENSIVE METABOLIC PANEL - Abnormal; Notable for the following components:      Result Value   Sodium 133 (*)    Potassium 5.7 (*)    CO2 18 (*)    Glucose, Bld 158 (*)    BUN 42 (*)    Creatinine, Ser 2.30 (*)    GFR, Estimated 33 (*)    All other components within normal limits  CBC - Abnormal; Notable for the following components:   RBC 4.16 (*)    All other  components within normal limits  URINALYSIS, ROUTINE W REFLEX MICROSCOPIC - Abnormal; Notable for the following components:   APPearance HAZY (*)    Glucose, UA 50 (*)    Ketones, ur 20 (*)    Protein, ur 100 (*)    All other components within normal limits  BETA-HYDROXYBUTYRIC ACID - Abnormal; Notable for the following components:   Beta-Hydroxybutyric Acid 1.98 (*)    All other components within normal limits  CBG MONITORING, ED - Abnormal; Notable for the following components:   Glucose-Capillary 155 (*)    All other components within normal limits  I-STAT VENOUS BLOOD GAS, ED - Abnormal; Notable for the following components:   pCO2, Ven 35.3 (*)    pO2, Ven 54 (*)    Bicarbonate 18.0 (*)    TCO2 19 (*)    Acid-base deficit 7.0 (*)    Sodium 131 (*)    Potassium 5.2 (*)    All other components within normal limits  LIPASE, BLOOD  MAGNESIUM    EKG EKG Interpretation  Date/Time:  Tuesday Jun 17 2022 18:41:41 EDT Ventricular Rate:  93 PR Interval:  147 QRS Duration: 80 QT Interval:  334 QTC Calculation: 411 R Axis:   23 Text Interpretation: Sinus rhythm Atrial premature complex RSR' in V1 or V2, probably normal variant Since last tracing rate faster Confirmed by Jacalyn Lefevre [phone]) on 06/17/2022 7:11:34 PM  Radiology DG Chest Portable 1 View  Result Date: 06/17/2022 CLINICAL DATA:  Nausea and vomiting for 2 days, initial encounter EXAM: PORTABLE CHEST 1 VIEW COMPARISON:  03/18/2022 FINDINGS: Cardiac shadow is within normal limits. The lungs are well aerated bilaterally. No focal infiltrate or effusion is seen. No bony abnormality is noted. IMPRESSION: No acute abnormality noted. Electronically Signed   By: Alcide Clever M.D.   On: 06/17/2022 19:27    Procedures Procedures    Medications Ordered in ED Medications  sodium chloride 0.9 % bolus 1,000 mL (0 mLs Intravenous Stopped 06/17/22 2009)  ondansetron (ZOFRAN) injection 4 mg (4 mg Intravenous Given 06/17/22 1811)   sodium chloride 0.9 % bolus 1,000 mL (1,000 mLs Intravenous New Bag/Given 06/17/22 2011)  ondansetron (ZOFRAN) injection 4 mg (4 mg Intravenous Given 06/17/22 2016)    ED Course/ Medical Decision Making/ A&P  Medical Decision Making Amount and/or Complexity of Data Reviewed Labs: ordered. Radiology: ordered.  Risk Prescription drug management. Decision regarding hospitalization.   This patient presents to the ED for concern of n/v, this involves an extensive number of treatment options, and is a complaint that carries with it a high risk of complications and morbidity.  The differential diagnosis includes dka, hyperemesis, infection   Co morbidities that complicate the patient evaluation  DM2, HTN, glaucoma, CKD, HLD, and marijuana abuse   Additional history obtained:  Additional history obtained from epic chart review    Lab Tests:  I Ordered, and personally interpreted labs.  The pertinent results include:   Vbg with ph 7.3 and PCO2 35; cmp with k elevated at 5.7, BS 158, bun elevated at 42 and cr elevated at 2.3 (bun 24 and cr 1.56 on 4/26); ua + glucose, ketones, protein; lip nl   Imaging Studies ordered:  I ordered imaging studies including cxr  I independently visualized and interpreted imaging which showed No acute abnormality noted.  I agree with the radiologist interpretation   Cardiac Monitoring:  The patient was maintained on a cardiac monitor.  I personally viewed and interpreted the cardiac monitored which showed an underlying rhythm of: st   Medicines ordered and prescription drug management:  I ordered medication including ivfs/zofran  for sx  Reevaluation of the patient after these medicines showed that the patient improved I have reviewed the patients home medicines and have made adjustments as needed   Test Considered:  ct   Critical Interventions:  ivfs   Consultations Obtained:  I requested  consultation with the hospitalist (Dr. Allena Katz),  and discussed lab and imaging findings as well as pertinent plan - he will admit   Problem List / ED Course:  Dehydration:  pt is given IVFs N/v:  pt given zofran which has helped, but the nausea came back AKI with hyperkalemia:  pt given ivfs.  I think k will come down with just fluids, so I have not ordered bicarb or lokelma.   Reevaluation:  After the interventions noted above, I reevaluated the patient and found that they have :improved   Social Determinants of Health:  Lives at home   Dispostion:  After consideration of the diagnostic results and the patients response to treatment, I feel that the patent would benefit from admission.          Final Clinical Impression(s) / ED Diagnoses Final diagnoses:  AKI (acute kidney injury) (HCC)  Dehydration  Nausea and vomiting, unspecified vomiting type  Hyperkalemia    Rx / DC Orders ED Discharge Orders     None         Jacalyn Lefevre, MD 06/17/22 2023

## 2022-06-17 NOTE — H&P (Signed)
History and Physical    James Bradford ZOX:[id number] DOB: 22-Dec-1965 DOA: 06/17/2022  PCP: Medicine, [facility]  Patient coming from: Home  I have personally briefly reviewed patient's old medical records in [facility]  Chief Complaint: Nausea vomiting  HPI: James Bradford is a 57 y.o. male with medical history significant for T2DM, HTN, CKD stage IIIa, and HLD who presented to the ED for evaluation of nausea and vomiting.  Patient reports 2 days of nausea and vomiting.  He has not been able to keep down any food.  He has had associated epigastric pain which has been nonradiating.  He denies any obvious dietary causes to his symptoms.  He believes his symptoms have been occurring due to taking all of his medications without meals.  He had an episode of chills but no fevers.  He denies chest pain, dyspnea, dysuria, diarrhea.  Reports smoking marijuana, last use about 4 days ago.  Reports occasional social alcohol use, none in the last few days.  He denies NSAID use.  ED Course  Labs/Imaging on admission: I have personally reviewed following labs and imaging studies.  Initial vitals showed BP 116/85, pulse 97, RR 18, temp 98.2 F, SpO2 100 100% on room air.  Labs show sodium 133, potassium 5.7, bicarb 18, BUN 42, creatinine 2.30 (baseline 1.4-1.5), serum glucose 158, LFTs within normal limits, lipase 44, WBC 5.7, hemoglobin 13.0, platelets 283,000, magnesium 1.9, beta hydroxybutyrate 1.98.  Urinalysis shows 20 ketones, 100 protein, negative nitrite, negative leukocytes, 0-5 RBCs and WBCs, no bacteria microscopy.  Formal chest x-ray negative for focal consolidation, edema, effusion.  Patient was given 2 L normal saline and IV Zofran.  The hospitalist service was consulted to admit for further evaluation and management.  Review of Systems: All systems reviewed and are negative except as documented in history of present illness above.   Past Medical History:   Diagnosis Date   Aortic atherosclerosis (HCC) 08/24/2020   Hyperlipidemia    Hypertension    Marijuana abuse 09/28/2020   Sinus bradycardia    Stage 3 chronic kidney disease (HCC)    Type 2 diabetes mellitus (HCC)    Unspecified glaucoma 02/18/2008   Qualifier: Diagnosis of  By: Daphine Deutscher FNP, Zena Amos      Past Surgical History:  Procedure Laterality Date   HAND SURGERY     LEG SURGERY      Social History:  reports that he has never smoked. He has never used smokeless tobacco. He reports current drug use. Drug: Marijuana. He reports that he does not drink alcohol.  Allergies  Allergen Reactions   Motrin [Ibuprofen] Other (See Comments)    Stomach pain    Pork-Derived Products     Religious beliefs   Asa [Aspirin] Other (See Comments)    Stomach pain    Family History  Problem Relation Age of Onset   Hypertension Mother    Pancreatic cancer Father      Prior to Admission medications   Medication Sig Start Date End Date Taking? Authorizing Provider  dicyclomine (BENTYL) 20 MG tablet Take 1 tablet (20 mg total) by mouth 2 (two) times daily. 12/25/19   Moshe Cipro, NP  folic acid (FOLVITE) 1 MG tablet Take 1 tablet (1 mg total) by mouth daily. 03/23/22   Rodolph Bong, MD  glipiZIDE (GLUCOTROL) 10 MG tablet Take 1 tablet (10 mg total) by mouth 2 (two) times daily before a meal. 03/22/22   Rodolph Bong, MD  insulin  degludec (TRESIBA FLEXTOUCH) 200 UNIT/ML FlexTouch Pen Inject 10 Units into the skin daily. 03/22/22   Rodolph Bong, MD  metFORMIN (GLUCOPHAGE) 1000 MG tablet Take 1 tablet (1,000 mg total) by mouth 2 (two) times daily with a meal. 03/22/22   Rodolph Bong, MD  Multiple Vitamin (MULTIVITAMIN WITH MINERALS) TABS tablet Take 1 tablet by mouth daily. 03/23/22   Rodolph Bong, MD  ondansetron (ZOFRAN) 4 MG tablet Take 1 tablet (4 mg total) by mouth every 8 (eight) hours as needed for nausea or vomiting. 05/30/22   Theron Arista, PA-C   pantoprazole (PROTONIX) 40 MG tablet Take 1 tablet (40 mg total) by mouth 2 (two) times daily before a meal. 03/22/22   Rodolph Bong, MD  pravastatin (PRAVACHOL) 10 MG tablet Take 1 tablet (10 mg total) by mouth daily. 03/22/22   Rodolph Bong, MD  thiamine (VITAMIN B-1) 100 MG tablet Take 1 tablet (100 mg total) by mouth daily. 03/23/22   Rodolph Bong, MD    Physical Exam: Vitals:   06/17/22 1830 06/17/22 1845 06/17/22 2015 06/17/22 2016  BP: 109/80 (!) 133/93 114/61   Pulse: (!) 107 93 89   Resp:  (!) 22 12   Temp:    98.2 F (36.8 C)  TempSrc:    Oral  SpO2: 100% 100% 99%    Constitutional: Resting in bed, NAD, calm, comfortable Eyes: EOMI, lids and conjunctivae normal ENMT: Mucous membranes are moist. Posterior pharynx clear of any exudate or lesions.Normal dentition.  Neck: normal, supple, no masses. Respiratory: clear to auscultation bilaterally, no wheezing, no crackles. Normal respiratory effort. No accessory muscle use.  Cardiovascular: Regular rate and rhythm, no murmurs / rubs / gallops. No extremity edema. 2+ pedal pulses. Abdomen: Epigastric tenderness, no masses palpated. No hepatosplenomegaly. Bowel sounds positive.  Musculoskeletal: no clubbing / cyanosis. No joint deformity upper and lower extremities. Good ROM, no contractures. Normal muscle tone.  Skin: no rashes, lesions, ulcers. No induration Neurologic: Sensation intact. Strength 5/5 in all 4.  Psychiatric: Alert and oriented x 3. Normal mood.   EKG: Personally reviewed. Sinus rhythm, 93, PAC present.  Rate is faster when compared to prior.  Assessment/Plan Principal Problem:   Acute kidney injury superimposed on chronic kidney disease stage IIIa (HCC) Active Problems:   Hyperkalemia   Nausea and vomiting   Type 2 diabetes mellitus (HCC)   Hypertension associated with diabetes (HCC)   Hyperlipidemia associated with type 2 diabetes mellitus (HCC)   James Bradford is a 57 y.o. male with  medical history significant for insulin-dependent T2DM, HTN, CKD stage IIIa, and HLD who is admitted with AKI on CKD stage IIIa with hyperkalemia in setting of nausea and vomiting.  Assessment and Plan: * Acute kidney injury superimposed on chronic kidney disease stage IIIa (HCC) Likely prerenal from hypovolemia due to GI losses. -Continue IV fluid hydration overnight -Repeat labs in a.m. -Hold metformin and lisinopril  Hyperkalemia In setting of AKI.  Continue IV fluids, give Lokelma.  Repeat labs in AM.  Nausea and vomiting Unclear etiology.  He has had some epigastric pain.  Lipase is normal.  History of GERD/gastritis. -Obtain KUB -Continue antiemetics as needed -IV fluid hydration overnight -Advance diet as tolerated -Continue oral Protonix  Type 2 diabetes mellitus (HCC) No evidence of DKA/HHS.  Hold metformin and glipizide.  Placed on SSI.  Hypertension associated with diabetes (HCC) BP stable.  Holding lisinopril as above.  Hyperlipidemia associated with type 2 diabetes mellitus (HCC) Continue  pravastatin.  DVT prophylaxis: SCDs Start: 06/17/22 2052 Code Status: DNR, confirmed with patient on admission.  Patient also reports he does not accept blood products due to religious reasons. Family Communication: Discussed with patient, he has deferred discussion with family. Disposition Plan: From home and likely discharge to home pending clinical progress Consults called: None Severity of Illness: The appropriate patient status for this patient is OBSERVATION. Observation status is judged to be reasonable and necessary in order to provide the required intensity of service to ensure the patient's safety. The patient's presenting symptoms, physical exam findings, and initial radiographic and laboratory data in the context of their medical condition is felt to place them at decreased risk for further clinical deterioration. Furthermore, it is anticipated that the patient will be  medically stable for discharge from the hospital within 2 midnights of admission.   Darreld Mclean MD [facility]  If 7PM-7AM, please contact night-coverage [email or web address]  06/17/2022, 8:56 PM

## 2022-06-18 DIAGNOSIS — N189 Chronic kidney disease, unspecified: Secondary | ICD-10-CM | POA: Diagnosis not present

## 2022-06-18 DIAGNOSIS — N179 Acute kidney failure, unspecified: Secondary | ICD-10-CM | POA: Diagnosis not present

## 2022-06-18 LAB — CBC
HCT: 32.1 % — ABNORMAL LOW (ref 39.0–52.0)
Hemoglobin: 10.7 g/dL — ABNORMAL LOW (ref 13.0–17.0)
MCH: 31 pg (ref 26.0–34.0)
MCHC: 33.3 g/dL (ref 30.0–36.0)
MCV: 93 fL (ref 80.0–100.0)
Platelets: 215 10*3/uL (ref 150–400)
RBC: 3.45 MIL/uL — ABNORMAL LOW (ref 4.22–5.81)
RDW: 13.8 % (ref 11.5–15.5)
WBC: 4.1 10*3/uL (ref 4.0–10.5)
nRBC: 0 % (ref 0.0–0.2)

## 2022-06-18 LAB — BASIC METABOLIC PANEL
Anion gap: 10 (ref 5–15)
BUN: 29 mg/dL — ABNORMAL HIGH (ref 6–20)
CO2: 20 mmol/L — ABNORMAL LOW (ref 22–32)
Calcium: 8.5 mg/dL — ABNORMAL LOW (ref 8.9–10.3)
Chloride: 107 mmol/L (ref 98–111)
Creatinine, Ser: 1.64 mg/dL — ABNORMAL HIGH (ref 0.61–1.24)
GFR, Estimated: 49 mL/min — ABNORMAL LOW (ref >60)
Glucose, Bld: 81 mg/dL (ref 70–99)
Potassium: 4.6 mmol/L (ref 3.5–5.1)
Sodium: 137 mmol/L (ref 135–145)

## 2022-06-18 LAB — RAPID URINE DRUG SCREEN, HOSP PERFORMED
Amphetamines: NOT DETECTED
Barbiturates: NOT DETECTED
Benzodiazepines: NOT DETECTED
Cocaine: NOT DETECTED
Opiates: NOT DETECTED
Tetrahydrocannabinol: POSITIVE — AB

## 2022-06-18 LAB — GLUCOSE, CAPILLARY: Glucose-Capillary: 187 mg/dL — ABNORMAL HIGH (ref 70–99)

## 2022-06-18 NOTE — Discharge Summary (Signed)
Discharge AGAINST MEDICAL ADVICE  James Bradford ZOX:[id number] DOB: [birth year] DOA: 06/17/2022  PCP: Medicine, [facility]  Admit date: 06/17/2022 Discharge date: 06/18/2022 left AMA  Admitted From: Home   Code Status: Prior   Discharge Diagnosis:   Principal Problem:   Acute kidney injury superimposed on chronic kidney disease stage IIIa (HCC) Active Problems:   Hyperkalemia   Nausea and vomiting   Type 2 diabetes mellitus (HCC)   Hypertension associated with diabetes (HCC)   Hyperlipidemia associated with type 2 diabetes mellitus (HCC)    History of Present Illness / Brief narrative:  James Bradford is a 57 y.o. male with PMH significant for T2DM, HTN, CKD stage IIIa, and HLD  5/14, patient presented to the ED for evaluation of nausea and vomiting for 2 days, unable to hold any food down, epigastric pain.  He believes his symptoms have been occurring due to taking all of his medications without meals.  He had an episode of chills but no fevers.  He denies chest pain, dyspnea, dysuria, diarrhea.  Reports smoking marijuana, last use about 4 days ago.  Reports occasional social alcohol use, none in the last few days.  He denies NSAID use.   In the ED, hemodynamically stable Labs showed sodium 133, potassium 5.7, bicarb 18, BUN 42, creatinine 2.30 (baseline 1.4-1.5), serum glucose 158, LFTs within normal limits, lipase 44, WBC 5.7, hemoglobin 13.0, platelets 283,000, magnesium 1.9, beta hydroxybutyrate 1.98. Urinalysis shows 20 ketones, 100 protein, negative nitrite, negative leukocytes, 0-5 RBCs and WBCs, no bacteria microscopy. UDS positive for [facility] Chest x-ray negative for focal consolidation, edema, effusion. Abdominal x-ray unremarkable Patient was admitted to [facility] for AKI  Hospital Course:  I reviewed patient's chart this morning and drafted a progress note but I had not had a chance to evaluate the patient.  He apparently decided to leave AMA before I got a  chance to examine him.  Below is the assessment and plan I had for him.  Intractable nausea and vomiting Presented with significant nausea and vomiting, poor oral intake, dehydration and AKI Abdominal x-ray negative for any intra-abdominal process Suspect nausea and vomiting is related to marijuana use Able to tolerate regular consistency diet this morning Continue as needed antiemetics Continue Protonix as before   Acute kidney injury superimposed on chronic kidney disease stage IIIa  Likely prerenal from hypovolemia due to GI losses. Given IV hydration overnight Oral intake improving Creatinine improving as below. Recent Labs    03/18/22 1934 03/18/22 2204 03/19/22 0305 03/19/22 0717 03/20/22 0304 03/21/22 0555 03/22/22 0542 05/30/22 1248 06/17/22 1539 06/18/22 0401  BUN 45* 42* 36* 37* 21* 21* 20 24* 42* 29*  CREATININE 2.31* 1.99* 1.91* 1.96* 1.43* 1.36* 1.49* 1.56* 2.30* 1.64*  CO2 24 21* 21* 22 23 24 25  17* 18* 20*     Hyperkalemia In setting of AKI.  Continue IV fluids, give Lokelma.  Repeat labs this morning showed improvement in potassium Recent Labs  Lab 06/17/22 1539 06/17/22 1812 06/17/22 1828 06/18/22 0401  K 5.7*  --  5.2* 4.6  MG  --  1.9  --   --    Type 2 diabetes mellitus uncontrolled with hyperglycemia A1c 9.9 on February 2024 PTA on metformin 1000 mg twice daily and glipizide 10 mg twice daily.  Currently both on hold Currently on sliding scale insulin. Recent Labs  Lab 06/17/22 1531 06/17/22 2149 06/18/22 0738  GLUCAP 155* 156* 187*   Hypertension Blood pressure stable.  PTA on  lisinopril 10 mg daily.  Currently on hold.  Hyperlipidemia Continue pravastatin  Medical Consultants:      Discharge Exam:  Not applicable.   Discharge Instructions:   The results of significant diagnostics from this hospitalization (including imaging, microbiology, ancillary and laboratory) are listed below for reference.    Procedures and  Diagnostic Studies:   DG Abd 1 View  Result Date: 06/17/2022 CLINICAL DATA:  Abdominal pain with nausea and vomiting x2 days. EXAM: ABDOMEN - 1 VIEW COMPARISON:  None Available. FINDINGS: The bowel gas pattern is normal. No radio-opaque calculi or other significant radiographic abnormality are seen. IMPRESSION: Negative. Electronically Signed   By: Aram Candela M.D.   On: 06/17/2022 21:10   DG Chest Portable 1 View  Result Date: 06/17/2022 CLINICAL DATA:  Nausea and vomiting for 2 days, initial encounter EXAM: PORTABLE CHEST 1 VIEW COMPARISON:  03/18/2022 FINDINGS: Cardiac shadow is within normal limits. The lungs are well aerated bilaterally. No focal infiltrate or effusion is seen. No bony abnormality is noted. IMPRESSION: No acute abnormality noted. Electronically Signed   By: Alcide Clever M.D.   On: 06/17/2022 19:27     Labs:   Basic Metabolic Panel: Recent Labs  Lab 06/17/22 1539 06/17/22 1812 06/17/22 1828 06/18/22 0401  NA 133*  --  131* 137  K 5.7*  --  5.2* 4.6  CL 100  --   --  107  CO2 18*  --   --  20*  GLUCOSE 158*  --   --  81  BUN 42*  --   --  29*  CREATININE 2.30*  --   --  1.64*  CALCIUM 9.5  --   --  8.5*  MG  --  1.9  --   --    GFR CrCl cannot be calculated (Unknown ideal weight.). Liver Function Tests: Recent Labs  Lab 06/17/22 1539  AST 41  ALT 28  ALKPHOS 58  BILITOT 0.8  PROT 7.9  ALBUMIN 4.7   Recent Labs  Lab 06/17/22 1539  LIPASE 44   No results for input(s): "AMMONIA" in the last 168 hours. Coagulation profile No results for input(s): "INR", "PROTIME" in the last 168 hours.  CBC: Recent Labs  Lab 06/17/22 1539 06/17/22 1828 06/18/22 0401  WBC 5.7  --  4.1  HGB 13.0 13.6 10.7*  HCT 39.5 40.0 32.1*  MCV 95.0  --  93.0  PLT 283  --  215   Cardiac Enzymes: No results for input(s): "CKTOTAL", "CKMB", "CKMBINDEX", "TROPONINI" in the last 168 hours. BNP: Invalid input(s): "POCBNP" CBG: Recent Labs  Lab 06/17/22 1531  06/17/22 2149 06/18/22 0738  GLUCAP 155* 156* 187*   D-Dimer No results for input(s): "DDIMER" in the last 72 hours. Hgb A1c No results for input(s): "HGBA1C" in the last 72 hours. Lipid Profile No results for input(s): "CHOL", "HDL", "LDLCALC", "TRIG", "CHOLHDL", "LDLDIRECT" in the last 72 hours. Thyroid function studies No results for input(s): "TSH", "T4TOTAL", "T3FREE", "THYROIDAB" in the last 72 hours.  Invalid input(s): "FREET3" Anemia work up No results for input(s): "VITAMINB12", "FOLATE", "FERRITIN", "TIBC", "IRON", "RETICCTPCT" in the last 72 hours. Microbiology No results found for this or any previous visit (from the past 240 hour(s)).  Signed: Lorin Glass  [facility] 06/18/2022, 5:35 PM

## 2022-06-18 NOTE — Progress Notes (Signed)
New Admission Note:   Arrival Method: stretcher Mental Orientation: alert x 4 Telemetry: box 14 Assessment: Completed Skin: se flowsheet IV: left FA Pain: none Tubes: none Safety Measures: Safety Fall Prevention Plan has been discussed Admission: Completed [facility] Orientation: Patient has been orientated to the room, unit and staff.  Family: none at bedside  Orders have been reviewed and implemented. Will continue to monitor the patient. Call light has been placed within reach and bed alarm has been activated.   Artemio Aly BSN, RN Phone number: [phone]

## 2022-06-18 NOTE — Progress Notes (Signed)
Patient called to nurses station at the beginning of shift questioning diet orders (full liquid). RN went to room to inform the patient we had an advance diet as tolerated order and renal/CM diet had been put in for the patient. Patient states he "doesn't even know why he's here" and requests IV be taken out. RN attempted to educate patient regarding AKI on CKD3 and necessity of IVF for rehydration. Patient states, "get this shit out of my arm I'm leaving."  PIV removed. MD made aware of patient leaving AMA.

## 2022-06-29 ENCOUNTER — Other Ambulatory Visit: Payer: Self-pay

## 2022-06-29 ENCOUNTER — Emergency Department (HOSPITAL_COMMUNITY)
Admission: EM | Admit: 2022-06-29 | Discharge: 2022-06-29 | Disposition: A | Discharge status: Home / Self Care | Payer: BLUE CROSS/BLUE SHIELD | Attending: Emergency Medicine | Admitting: Emergency Medicine

## 2022-06-29 ENCOUNTER — Encounter (HOSPITAL_COMMUNITY): Payer: Self-pay

## 2022-06-29 DIAGNOSIS — E1165 Type 2 diabetes mellitus with hyperglycemia: Secondary | ICD-10-CM | POA: Diagnosis not present

## 2022-06-29 DIAGNOSIS — E1122 Type 2 diabetes mellitus with diabetic chronic kidney disease: Secondary | ICD-10-CM | POA: Diagnosis not present

## 2022-06-29 DIAGNOSIS — R1030 Lower abdominal pain, unspecified: Secondary | ICD-10-CM | POA: Diagnosis present

## 2022-06-29 DIAGNOSIS — Z7984 Long term (current) use of oral hypoglycemic drugs: Secondary | ICD-10-CM | POA: Insufficient documentation

## 2022-06-29 DIAGNOSIS — R1084 Generalized abdominal pain: Secondary | ICD-10-CM | POA: Insufficient documentation

## 2022-06-29 DIAGNOSIS — R7989 Other specified abnormal findings of blood chemistry: Secondary | ICD-10-CM | POA: Insufficient documentation

## 2022-06-29 DIAGNOSIS — I129 Hypertensive chronic kidney disease with stage 1 through stage 4 chronic kidney disease, or unspecified chronic kidney disease: Secondary | ICD-10-CM | POA: Diagnosis not present

## 2022-06-29 DIAGNOSIS — Z794 Long term (current) use of insulin: Secondary | ICD-10-CM | POA: Diagnosis not present

## 2022-06-29 DIAGNOSIS — N183 Chronic kidney disease, stage 3 unspecified: Secondary | ICD-10-CM | POA: Diagnosis not present

## 2022-06-29 DIAGNOSIS — Z79899 Other long term (current) drug therapy: Secondary | ICD-10-CM | POA: Insufficient documentation

## 2022-06-29 LAB — URINALYSIS, ROUTINE W REFLEX MICROSCOPIC
Bacteria, UA: NONE SEEN
Bilirubin Urine: NEGATIVE
Glucose, UA: NEGATIVE mg/dL
Hgb urine dipstick: NEGATIVE
Ketones, ur: NEGATIVE mg/dL
Leukocytes,Ua: NEGATIVE
Nitrite: NEGATIVE
Protein, ur: 30 mg/dL — AB
Specific Gravity, Urine: 1.017 (ref 1.005–1.030)
pH: 5 (ref 5.0–8.0)

## 2022-06-29 LAB — CBC
HCT: 34.8 % — ABNORMAL LOW (ref 39.0–52.0)
Hemoglobin: 11.6 g/dL — ABNORMAL LOW (ref 13.0–17.0)
MCH: 32 pg (ref 26.0–34.0)
MCHC: 33.3 g/dL (ref 30.0–36.0)
MCV: 96.1 fL (ref 80.0–100.0)
Platelets: 317 10*3/uL (ref 150–400)
RBC: 3.62 MIL/uL — ABNORMAL LOW (ref 4.22–5.81)
RDW: 13.2 % (ref 11.5–15.5)
WBC: 4.4 10*3/uL (ref 4.0–10.5)
nRBC: 0 % (ref 0.0–0.2)

## 2022-06-29 LAB — COMPREHENSIVE METABOLIC PANEL
ALT: 32 U/L (ref 0–44)
AST: 33 U/L (ref 15–41)
Albumin: 4.4 g/dL (ref 3.5–5.0)
Alkaline Phosphatase: 47 U/L (ref 38–126)
Anion gap: 9 (ref 5–15)
BUN: 22 mg/dL — ABNORMAL HIGH (ref 6–20)
CO2: 22 mmol/L (ref 22–32)
Calcium: 10 mg/dL (ref 8.9–10.3)
Chloride: 107 mmol/L (ref 98–111)
Creatinine, Ser: 1.29 mg/dL — ABNORMAL HIGH (ref 0.61–1.24)
GFR, Estimated: >60 mL/min (ref >60)
Glucose, Bld: 158 mg/dL — ABNORMAL HIGH (ref 70–99)
Potassium: 4.6 mmol/L (ref 3.5–5.1)
Sodium: 138 mmol/L (ref 135–145)
Total Bilirubin: 0.5 mg/dL (ref 0.3–1.2)
Total Protein: 7.6 g/dL (ref 6.5–8.1)

## 2022-06-29 LAB — CBG MONITORING, ED: Glucose-Capillary: 133 mg/dL — ABNORMAL HIGH (ref 70–99)

## 2022-06-29 LAB — LIPASE, BLOOD: Lipase: 51 U/L (ref 11–51)

## 2022-06-29 MED ORDER — SODIUM CHLORIDE 0.9 % IV BOLUS
1000.0000 mL | Freq: Once | INTRAVENOUS | Status: AC
  Administered 2022-06-29: 1000 mL via INTRAVENOUS

## 2022-06-29 NOTE — ED Triage Notes (Signed)
C/o lower abd pain x2 weeks.  Denies n/v Hx DM Pt reports pain has worsened since leaving AMA on 5/15;  Occasional Marijuana and ETOH usage per patient.

## 2022-06-29 NOTE — ED Provider Notes (Signed)
[place] EMERGENCY DEPARTMENT AT [facility] Provider Note   CSN: [id number] Arrival date & time: 06/29/22  9629     History  Chief Complaint  Patient presents with   Abdominal Pain    James Bradford is a 57 y.o. male with history of hypertension, type 2 diabetes, stage III chronic kidney disease, hyperlipidemia, marijuana use, who presents the emergency department complaining of lower abdominal pain.  States this has been ongoing for about 2 weeks or so.  Denies any nausea or vomiting.  He left the hospital AMA on 5/15, and states that his pain has gotten worse since then.  Still occasionally using marijuana and alcohol.  He states that he has continued to have intermittent diarrhea.  Mainly only has abdominal pain when he takes his medications in the morning without food.  He has been trying to hydrate well, and drinking raw cranberry juice.  He is here today to ensure that his kidneys are not shutting down.   Abdominal Pain Associated symptoms: diarrhea        Home Medications Prior to Admission medications   Medication Sig Start Date End Date Taking? Authorizing Provider  glipiZIDE (GLUCOTROL) 10 MG tablet Take 1 tablet (10 mg total) by mouth 2 (two) times daily before a meal. 03/22/22   Rodolph Bong, MD  insulin degludec (TRESIBA FLEXTOUCH) 200 UNIT/ML FlexTouch Pen Inject 10 Units into the skin daily. Patient not taking: Reported on 06/17/2022 03/22/22   Rodolph Bong, MD  lisinopril (ZESTRIL) 10 MG tablet Take 10 mg by mouth at bedtime.    [provider]  metFORMIN (GLUCOPHAGE) 1000 MG tablet Take 1 tablet (1,000 mg total) by mouth 2 (two) times daily with a meal. 03/22/22   Rodolph Bong, MD  Multiple Vitamins-Minerals (MENS 50+ MULTIVITAMIN) TABS Take 1 tablet by mouth daily.    [provider]  ondansetron (ZOFRAN) 4 MG tablet Take 1 tablet (4 mg total) by mouth every 8 (eight) hours as needed for nausea or vomiting. Patient not  taking: Reported on 06/17/2022 05/30/22   Theron Arista, PA-C  pantoprazole (PROTONIX) 40 MG tablet Take 1 tablet (40 mg total) by mouth 2 (two) times daily before a meal. Patient taking differently: Take 40 mg by mouth at bedtime. 03/22/22   Rodolph Bong, MD  pravastatin (PRAVACHOL) 10 MG tablet Take 1 tablet (10 mg total) by mouth daily. Patient taking differently: Take 10 mg by mouth at bedtime. 03/22/22   Rodolph Bong, MD  sildenafil (VIAGRA) 100 MG tablet Take 100 mg by mouth daily as needed for erectile dysfunction.    [provider]      Allergies    Ibuprofen, Pork-derived products, and Asa [aspirin]    Review of Systems   Review of Systems  Gastrointestinal:  Positive for abdominal pain and diarrhea.  All other systems reviewed and are negative.   Physical Exam Updated Vital Signs BP (!) 146/120 (BP Location: Left Arm)   Pulse 80   Temp 97.8 F (36.6 C) (Oral)   Resp 18   Wt 68 kg   SpO2 100%   BMI 23.48 kg/m  Physical Exam Vitals and nursing note reviewed.  Constitutional:      Appearance: Normal appearance.  HENT:     Head: Normocephalic and atraumatic.  Eyes:     Conjunctiva/sclera: Conjunctivae normal.  Cardiovascular:     Rate and Rhythm: Normal rate and regular rhythm.  Pulmonary:     Effort: Pulmonary effort  is normal. No respiratory distress.     Breath sounds: Normal breath sounds.  Abdominal:     General: There is no distension.     Palpations: Abdomen is soft.     Tenderness: There is generalized abdominal tenderness. There is no guarding.  Skin:    General: Skin is warm and dry.  Neurological:     General: No focal deficit present.     Mental Status: He is alert.     ED Results / Procedures / Treatments   Labs (all labs ordered are listed, but only abnormal results are displayed) Labs Reviewed  COMPREHENSIVE METABOLIC PANEL - Abnormal; Notable for the following components:      Result Value   Glucose, Bld 158 (*)    BUN  22 (*)    Creatinine, Ser 1.29 (*)    All other components within normal limits  CBC - Abnormal; Notable for the following components:   RBC 3.62 (*)    Hemoglobin 11.6 (*)    HCT 34.8 (*)    All other components within normal limits  URINALYSIS, ROUTINE W REFLEX MICROSCOPIC - Abnormal; Notable for the following components:   Protein, ur 30 (*)    All other components within normal limits  CBG MONITORING, ED - Abnormal; Notable for the following components:   Glucose-Capillary 133 (*)    All other components within normal limits  LIPASE, BLOOD    EKG None  Radiology No results found.  Procedures Procedures    Medications Ordered in ED Medications  sodium chloride 0.9 % bolus 1,000 mL (1,000 mLs Intravenous New Bag/Given 06/29/22 1610)    ED Course/ Medical Decision Making/ A&P                             Medical Decision Making Amount and/or Complexity of Data Reviewed Labs: ordered.   This patient is a 57 y.o. male  who presents to the ED for concern of abdominal pain.   Differential diagnoses prior to evaluation: The emergent differential diagnosis includes, but is not limited to,  AAA, mesenteric ischemia, appendicitis, diverticulitis, DKA, gastroenteritis, nephrolithiasis, pancreatitis, constipation, UTI, bowel obstruction, biliary disease, IBD, PUD, hepatitis. This is not an exhaustive differential.   Past Medical History / Co-morbidities / Social History: hypertension, type 2 diabetes, stage III chronic kidney disease, hyperlipidemia, marijuana use  Additional history: Chart reviewed. Pertinent results include: Reviewed AMA summary from 5/15.  Patient initially had been admitted for AKI superimposed on chronic kidney disease, likely in the setting of GI loss.  Physical Exam: Physical exam performed. The pertinent findings include: Mildly hypertensive, otherwise normal vital signs.  No acute distress.  Abdomen soft, generalized tenderness.  No guarding.  Lab  Tests/Imaging studies: I personally interpreted labs/imaging and the pertinent results include: No leukocytosis, hemoglobin stable.  CMP with improving creatinine compared to prior but still mildly elevated.  Glucose 133.  Normal lipase.  Urinalysis negative for hematuria or infection, 30 protein.   This patient has a benign abdominal exam, and reassuring laboratory evaluation, will defer abdominal imaging at this time.  Medications: I ordered medication including IV fluids.  I have reviewed the patients home medicines and have made adjustments as needed.   Disposition: After consideration of the diagnostic results and the patients response to treatment, I feel that emergency department workup does not suggest an emergent condition requiring admission or immediate intervention beyond what has been performed at this time. The plan  is: Discharge to home with continued symptomatic management of generalized abdominal pain.  Not in DKA.  Kidney function improving compared to prior ER visit/hospitalization.  Tolerating p.o, believe he can keep hydrating at home and follow-up with his primary doctor.  No emergent etiology found for his symptoms today. The patient is safe for discharge and has been instructed to return immediately for worsening symptoms, change in symptoms or any other concerns.  Final Clinical Impression(s) / ED Diagnoses Final diagnoses:  Generalized abdominal pain  Elevated serum creatinine    Rx / DC Orders ED Discharge Orders     None      Portions of this report may have been transcribed using voice recognition software. Every effort was made to ensure accuracy; however, inadvertent computerized transcription errors may be present.    Jeanella Flattery 06/29/22 0845    Maia Plan, MD 07/01/22 2222

## 2022-06-29 NOTE — Discharge Instructions (Signed)
You are seen in the emergency department today for ongoing abdominal pain.  As we discussed your kidney function is stable that you are here.  I recommend to hydrate well.  Please follow-up with your primary doctor regarding your medications, and any ongoing symptoms.  Continue to monitor how you are doing and return to the ER for any worsening symptoms.

## 2022-06-29 NOTE — ED Notes (Signed)
Pt aware urine sample is needed, urinal provided. 

## 2022-08-01 ENCOUNTER — Emergency Department (HOSPITAL_COMMUNITY)
Admission: EM | Admit: 2022-08-01 | Discharge: 2022-08-01 | Discharge status: Against Medical Advice | Payer: BLUE CROSS/BLUE SHIELD | Attending: Emergency Medicine | Admitting: Emergency Medicine

## 2022-08-01 ENCOUNTER — Encounter (HOSPITAL_COMMUNITY): Payer: Self-pay | Admitting: *Deleted

## 2022-08-01 ENCOUNTER — Other Ambulatory Visit: Payer: Self-pay

## 2022-08-01 DIAGNOSIS — R739 Hyperglycemia, unspecified: Secondary | ICD-10-CM | POA: Diagnosis present

## 2022-08-01 DIAGNOSIS — R112 Nausea with vomiting, unspecified: Secondary | ICD-10-CM | POA: Insufficient documentation

## 2022-08-01 DIAGNOSIS — Z5321 Procedure and treatment not carried out due to patient leaving prior to being seen by health care provider: Secondary | ICD-10-CM | POA: Diagnosis not present

## 2022-08-01 DIAGNOSIS — R197 Diarrhea, unspecified: Secondary | ICD-10-CM | POA: Diagnosis not present

## 2022-08-01 LAB — CBC
HCT: 33.7 % — ABNORMAL LOW (ref 39.0–52.0)
Hemoglobin: 11.2 g/dL — ABNORMAL LOW (ref 13.0–17.0)
MCH: 31.5 pg (ref 26.0–34.0)
MCHC: 33.2 g/dL (ref 30.0–36.0)
MCV: 94.9 fL (ref 80.0–100.0)
Platelets: 289 10*3/uL (ref 150–400)
RBC: 3.55 MIL/uL — ABNORMAL LOW (ref 4.22–5.81)
RDW: 14 % (ref 11.5–15.5)
WBC: 4.4 10*3/uL (ref 4.0–10.5)
nRBC: 0 % (ref 0.0–0.2)

## 2022-08-01 LAB — URINALYSIS, ROUTINE W REFLEX MICROSCOPIC
Bilirubin Urine: NEGATIVE
Glucose, UA: >=500 mg/dL — AB
Hgb urine dipstick: NEGATIVE
Ketones, ur: NEGATIVE mg/dL
Leukocytes,Ua: NEGATIVE
Nitrite: NEGATIVE
Protein, ur: 30 mg/dL — AB
Specific Gravity, Urine: 1.011 (ref 1.005–1.030)
pH: 5 (ref 5.0–8.0)

## 2022-08-01 LAB — COMPREHENSIVE METABOLIC PANEL
ALT: 25 U/L (ref 0–44)
AST: 34 U/L (ref 15–41)
Albumin: 3.9 g/dL (ref 3.5–5.0)
Alkaline Phosphatase: 58 U/L (ref 38–126)
Anion gap: 12 (ref 5–15)
BUN: 18 mg/dL (ref 6–20)
CO2: 20 mmol/L — ABNORMAL LOW (ref 22–32)
Calcium: 9.2 mg/dL (ref 8.9–10.3)
Chloride: 106 mmol/L (ref 98–111)
Creatinine, Ser: 1.23 mg/dL (ref 0.61–1.24)
GFR, Estimated: >60 mL/min (ref >60)
Glucose, Bld: 218 mg/dL — ABNORMAL HIGH (ref 70–99)
Potassium: 4.2 mmol/L (ref 3.5–5.1)
Sodium: 138 mmol/L (ref 135–145)
Total Bilirubin: 0.6 mg/dL (ref 0.3–1.2)
Total Protein: 6.8 g/dL (ref 6.5–8.1)

## 2022-08-01 LAB — LIPASE, BLOOD: Lipase: 48 U/L (ref 11–51)

## 2022-08-01 LAB — CBG MONITORING, ED: Glucose-Capillary: 213 mg/dL — ABNORMAL HIGH (ref 70–99)

## 2022-08-01 NOTE — ED Notes (Signed)
Pt called to be roomed X2 no answer.

## 2022-08-01 NOTE — ED Triage Notes (Signed)
The pt reports that his blood sugar has been high for the past 3 days   some nausea vomiting and diarhea

## 2022-08-03 ENCOUNTER — Encounter (HOSPITAL_COMMUNITY): Payer: Self-pay

## 2022-08-03 ENCOUNTER — Emergency Department (HOSPITAL_COMMUNITY)
Admission: EM | Admit: 2022-08-03 | Discharge: 2022-08-03 | Disposition: A | Discharge status: Home / Self Care | Payer: BLUE CROSS/BLUE SHIELD | Attending: Emergency Medicine | Admitting: Emergency Medicine

## 2022-08-03 DIAGNOSIS — Z794 Long term (current) use of insulin: Secondary | ICD-10-CM | POA: Diagnosis not present

## 2022-08-03 DIAGNOSIS — I129 Hypertensive chronic kidney disease with stage 1 through stage 4 chronic kidney disease, or unspecified chronic kidney disease: Secondary | ICD-10-CM | POA: Diagnosis not present

## 2022-08-03 DIAGNOSIS — E119 Type 2 diabetes mellitus without complications: Secondary | ICD-10-CM | POA: Diagnosis not present

## 2022-08-03 DIAGNOSIS — Z7984 Long term (current) use of oral hypoglycemic drugs: Secondary | ICD-10-CM | POA: Insufficient documentation

## 2022-08-03 DIAGNOSIS — R197 Diarrhea, unspecified: Secondary | ICD-10-CM | POA: Insufficient documentation

## 2022-08-03 DIAGNOSIS — N183 Chronic kidney disease, stage 3 unspecified: Secondary | ICD-10-CM | POA: Insufficient documentation

## 2022-08-03 DIAGNOSIS — R112 Nausea with vomiting, unspecified: Secondary | ICD-10-CM | POA: Diagnosis present

## 2022-08-03 LAB — CBC WITH DIFFERENTIAL/PLATELET
Abs Immature Granulocytes: 0.01 10*3/uL (ref 0.00–0.07)
Basophils Absolute: 0 10*3/uL (ref 0.0–0.1)
Basophils Relative: 1 %
Eosinophils Absolute: 0.2 10*3/uL (ref 0.0–0.5)
Eosinophils Relative: 3 %
HCT: 38.3 % — ABNORMAL LOW (ref 39.0–52.0)
Hemoglobin: 12.5 g/dL — ABNORMAL LOW (ref 13.0–17.0)
Immature Granulocytes: 0 %
Lymphocytes Relative: 26 %
Lymphs Abs: 1.3 10*3/uL (ref 0.7–4.0)
MCH: 31.8 pg (ref 26.0–34.0)
MCHC: 32.6 g/dL (ref 30.0–36.0)
MCV: 97.5 fL (ref 80.0–100.0)
Monocytes Absolute: 0.5 10*3/uL (ref 0.1–1.0)
Monocytes Relative: 10 %
Neutro Abs: 3.2 10*3/uL (ref 1.7–7.7)
Neutrophils Relative %: 60 %
Platelets: 288 10*3/uL (ref 150–400)
RBC: 3.93 MIL/uL — ABNORMAL LOW (ref 4.22–5.81)
RDW: 14 % (ref 11.5–15.5)
WBC: 5.2 10*3/uL (ref 4.0–10.5)
nRBC: 0 % (ref 0.0–0.2)

## 2022-08-03 LAB — COMPREHENSIVE METABOLIC PANEL
ALT: 27 U/L (ref 0–44)
AST: 27 U/L (ref 15–41)
Albumin: 4.6 g/dL (ref 3.5–5.0)
Alkaline Phosphatase: 67 U/L (ref 38–126)
Anion gap: 11 (ref 5–15)
BUN: 24 mg/dL — ABNORMAL HIGH (ref 6–20)
CO2: 22 mmol/L (ref 22–32)
Calcium: 9.1 mg/dL (ref 8.9–10.3)
Chloride: 104 mmol/L (ref 98–111)
Creatinine, Ser: 1.2 mg/dL (ref 0.61–1.24)
GFR, Estimated: >60 mL/min (ref >60)
Glucose, Bld: 146 mg/dL — ABNORMAL HIGH (ref 70–99)
Potassium: 4.1 mmol/L (ref 3.5–5.1)
Sodium: 137 mmol/L (ref 135–145)
Total Bilirubin: 0.5 mg/dL (ref 0.3–1.2)
Total Protein: 7.9 g/dL (ref 6.5–8.1)

## 2022-08-03 LAB — URINALYSIS, ROUTINE W REFLEX MICROSCOPIC
Bacteria, UA: NONE SEEN
Bilirubin Urine: NEGATIVE
Glucose, UA: NEGATIVE mg/dL
Hgb urine dipstick: NEGATIVE
Ketones, ur: NEGATIVE mg/dL
Leukocytes,Ua: NEGATIVE
Nitrite: NEGATIVE
Protein, ur: 30 mg/dL — AB
Specific Gravity, Urine: 1.017 (ref 1.005–1.030)
pH: 5 (ref 5.0–8.0)

## 2022-08-03 LAB — LIPASE, BLOOD: Lipase: 49 U/L (ref 11–51)

## 2022-08-03 LAB — CBG MONITORING, ED: Glucose-Capillary: 138 mg/dL — ABNORMAL HIGH (ref 70–99)

## 2022-08-03 MED ORDER — ONDANSETRON HCL 4 MG/2ML IJ SOLN
4.0000 mg | Freq: Once | INTRAMUSCULAR | Status: AC
  Administered 2022-08-03: 4 mg via INTRAVENOUS
  Filled 2022-08-03: qty 2

## 2022-08-03 MED ORDER — SODIUM CHLORIDE 0.9 % IV BOLUS
1000.0000 mL | Freq: Once | INTRAVENOUS | Status: AC
  Administered 2022-08-03: 1000 mL via INTRAVENOUS

## 2022-08-03 MED ORDER — ONDANSETRON HCL 4 MG PO TABS: 4.0000 mg | ORAL_TABLET | Freq: Three times a day (TID) | ORAL | 0 refills | Status: DC | PRN

## 2022-08-03 NOTE — Discharge Instructions (Addendum)
You were evaluated today for nausea, vomiting, diarrhea, and blood glucose fluctuations.  Your workup was reassuring today.  Your kidney function has improved to previous visit.  Your hemoglobin has also improved.  I have prescribed Zofran for nausea to be taken as directed.  Please be sure to eat as you are able.  I recommend following up with your primary care provider as needed for further recommendations on glucose control.  If you develop any life-threatening symptoms please return to the emergency department.

## 2022-08-03 NOTE — ED Triage Notes (Addendum)
Pt arrived via POV, states he has been having issues with blood sugars intermittently dropping. No recent medication changes. States he is feeling diaphoretic and altered when sugar drops.

## 2022-08-03 NOTE — ED Provider Notes (Signed)
[place] EMERGENCY DEPARTMENT AT [facility] Provider Note   CSN: [id number] Arrival date & time: 08/03/22  1115     History  Chief Complaint  Patient presents with   Blood Sugar Problem    James Bradford is a 57 y.o. male.  Patient presents to the emergency department complaining of 4 days of intermittent nausea, vomiting, diarrhea and intermittent fluctuations in his blood glucose.  Patient states he is seeing glucose readings as low as 63 and as high as 350 over the past 4 days.  He states that starting Tuesday he began to have nausea, vomiting, and diarrhea.  His last episode of emesis was yesterday.  He states he was able to eat solid food last night.  He endorses 1 episode of diarrhea this morning.  He also endorses generalized abdominal discomfort.  Patient denies chest pain, shortness of breath, fever, urinary symptoms, blood in stool.  Past medical history significant for type II DM currently on metformin and glipizide; hypertension; stage III CKD  HPI     Home Medications Prior to Admission medications   Medication Sig Start Date End Date Taking? Authorizing Provider  ondansetron (ZOFRAN) 4 MG tablet Take 1 tablet (4 mg total) by mouth every 8 (eight) hours as needed for nausea or vomiting. 08/03/22  Yes Barrie Dunker B, PA-C  glipiZIDE (GLUCOTROL) 10 MG tablet Take 1 tablet (10 mg total) by mouth 2 (two) times daily before a meal. 03/22/22   Rodolph Bong, MD  insulin degludec (TRESIBA FLEXTOUCH) 200 UNIT/ML FlexTouch Pen Inject 10 Units into the skin daily. Patient not taking: Reported on 06/17/2022 03/22/22   Rodolph Bong, MD  lisinopril (ZESTRIL) 10 MG tablet Take 10 mg by mouth at bedtime.    [provider]  metFORMIN (GLUCOPHAGE) 1000 MG tablet Take 1 tablet (1,000 mg total) by mouth 2 (two) times daily with a meal. 03/22/22   Rodolph Bong, MD  Multiple Vitamins-Minerals (MENS 50+ MULTIVITAMIN) TABS Take 1 tablet by mouth daily.     [provider]  pantoprazole (PROTONIX) 40 MG tablet Take 1 tablet (40 mg total) by mouth 2 (two) times daily before a meal. Patient taking differently: Take 40 mg by mouth at bedtime. 03/22/22   Rodolph Bong, MD  pravastatin (PRAVACHOL) 10 MG tablet Take 1 tablet (10 mg total) by mouth daily. Patient taking differently: Take 10 mg by mouth at bedtime. 03/22/22   Rodolph Bong, MD  sildenafil (VIAGRA) 100 MG tablet Take 100 mg by mouth daily as needed for erectile dysfunction.    [provider]      Allergies    Ibuprofen, Pork-derived products, and Asa [aspirin]    Review of Systems   Review of Systems  Physical Exam Updated Vital Signs BP (!) 146/93   Pulse 67   Temp 98.4 F (36.9 C) (Oral)   Resp 14   SpO2 100%  Physical Exam Vitals and nursing note reviewed.  Constitutional:      General: He is not in acute distress.    Appearance: He is well-developed.  HENT:     Head: Normocephalic and atraumatic.     Mouth/Throat:     Mouth: Mucous membranes are moist.  Eyes:     General: No scleral icterus.    Conjunctiva/sclera: Conjunctivae normal.  Cardiovascular:     Rate and Rhythm: Normal rate and regular rhythm.     Heart sounds: No murmur heard. Pulmonary:     Effort: Pulmonary  effort is normal. No respiratory distress.     Breath sounds: Normal breath sounds.  Abdominal:     Palpations: Abdomen is soft.     Tenderness: There is no abdominal tenderness.  Musculoskeletal:        General: No swelling.     Cervical back: Neck supple.  Skin:    General: Skin is warm and dry.     Capillary Refill: Capillary refill takes less than 2 seconds.  Neurological:     Mental Status: He is alert.  Psychiatric:        Mood and Affect: Mood normal.     ED Results / Procedures / Treatments   Labs (all labs ordered are listed, but only abnormal results are displayed) Labs Reviewed  CBC WITH DIFFERENTIAL/PLATELET - Abnormal; Notable for the  following components:      Result Value   RBC 3.93 (*)    Hemoglobin 12.5 (*)    HCT 38.3 (*)    All other components within normal limits  COMPREHENSIVE METABOLIC PANEL - Abnormal; Notable for the following components:   Glucose, Bld 146 (*)    BUN 24 (*)    All other components within normal limits  URINALYSIS, ROUTINE W REFLEX MICROSCOPIC - Abnormal; Notable for the following components:   Protein, ur 30 (*)    All other components within normal limits  CBG MONITORING, ED - Abnormal; Notable for the following components:   Glucose-Capillary 138 (*)    All other components within normal limits  LIPASE, BLOOD    EKG None  Radiology No results found.  Procedures Procedures    Medications Ordered in ED Medications  ondansetron (ZOFRAN) injection 4 mg (4 mg Intravenous Given 08/03/22 1246)  sodium chloride 0.9 % bolus 1,000 mL (1,000 mLs Intravenous New Bag/Given 08/03/22 1246)    ED Course/ Medical Decision Making/ A&P                             Medical Decision Making Amount and/or Complexity of Data Reviewed Labs: ordered.  Risk Prescription drug management.   This patient presents to the ED for concern of nausea, vomiting, and diarrhea, this involves an extensive number of treatment options, and is a complaint that carries with it a high risk of complications and morbidity.  The differential diagnosis includes gastroenteritis, appendicitis, cholecystitis, DKA, hyperglycemia, others   Co morbidities that complicate the patient evaluation  Type II DM   Additional history obtained:   External records from outside source obtained and reviewed including recent telemedicine notes   Lab Tests:  I Ordered, and personally interpreted labs.  The pertinent results include: Glucose 146, no significant Anion gap, hemoglobin 12.5   Problem List / ED Course / Critical interventions / Medication management   I ordered medication including Zofran for nausea,  normal saline for fluid resuscitation Reevaluation of the patient after these medicines showed that the patient improved I have reviewed the patients home medicines and have made adjustments as needed    Test / Admission - Considered:  Patient feels much better after rehydration.  Feel the patient may have had gastroenteritis which impacted his glucose control.  Currently glucose is within normal limits.  Patient with no active vomiting at this time able to tolerate oral intake.  1 episode of diarrhea today.  No significant abdominal tenderness to necessitate abdominal imaging.  Nonsurgical/nonacute abdomen.  Plan to discharge home with prescription for Zofran and recommendations  to resume home medication regimen and oral intake at home.  Patient will follow-up with his primary care provider for further evaluation and management as needed.         Final Clinical Impression(s) / ED Diagnoses Final diagnoses:  Nausea vomiting and diarrhea    Rx / DC Orders ED Discharge Orders          Ordered    ondansetron (ZOFRAN) 4 MG tablet  Every 8 hours PRN        08/03/22 1414              Pamala Duffel 08/03/22 1415    Terald Sleeper, MD 08/03/22 [phone]

## 2022-08-06 ENCOUNTER — Emergency Department (HOSPITAL_COMMUNITY)
Admission: EM | Admit: 2022-08-06 | Discharge: 2022-08-06 | Disposition: A | Discharge status: Home / Self Care | Payer: BLUE CROSS/BLUE SHIELD | Attending: Emergency Medicine | Admitting: Emergency Medicine

## 2022-08-06 ENCOUNTER — Other Ambulatory Visit: Payer: Self-pay

## 2022-08-06 DIAGNOSIS — F12188 Cannabis abuse with other cannabis-induced disorder: Secondary | ICD-10-CM | POA: Diagnosis not present

## 2022-08-06 DIAGNOSIS — E1122 Type 2 diabetes mellitus with diabetic chronic kidney disease: Secondary | ICD-10-CM | POA: Diagnosis not present

## 2022-08-06 DIAGNOSIS — R000 Tachycardia, unspecified: Secondary | ICD-10-CM | POA: Insufficient documentation

## 2022-08-06 DIAGNOSIS — Z7984 Long term (current) use of oral hypoglycemic drugs: Secondary | ICD-10-CM | POA: Diagnosis not present

## 2022-08-06 DIAGNOSIS — Z794 Long term (current) use of insulin: Secondary | ICD-10-CM | POA: Insufficient documentation

## 2022-08-06 DIAGNOSIS — I129 Hypertensive chronic kidney disease with stage 1 through stage 4 chronic kidney disease, or unspecified chronic kidney disease: Secondary | ICD-10-CM | POA: Insufficient documentation

## 2022-08-06 DIAGNOSIS — Z79899 Other long term (current) drug therapy: Secondary | ICD-10-CM | POA: Insufficient documentation

## 2022-08-06 DIAGNOSIS — R1084 Generalized abdominal pain: Secondary | ICD-10-CM | POA: Diagnosis present

## 2022-08-06 DIAGNOSIS — N189 Chronic kidney disease, unspecified: Secondary | ICD-10-CM | POA: Diagnosis not present

## 2022-08-06 LAB — URINALYSIS, ROUTINE W REFLEX MICROSCOPIC
Bacteria, UA: NONE SEEN
Bilirubin Urine: NEGATIVE
Glucose, UA: 150 mg/dL — AB
Hgb urine dipstick: NEGATIVE
Ketones, ur: 5 mg/dL — AB
Leukocytes,Ua: NEGATIVE
Nitrite: NEGATIVE
Protein, ur: 30 mg/dL — AB
Specific Gravity, Urine: 1.014 (ref 1.005–1.030)
pH: 5 (ref 5.0–8.0)

## 2022-08-06 LAB — CBC WITH DIFFERENTIAL/PLATELET
Abs Immature Granulocytes: 0.04 10*3/uL (ref 0.00–0.07)
Basophils Absolute: 0 10*3/uL (ref 0.0–0.1)
Basophils Relative: 0 %
Eosinophils Absolute: 0 10*3/uL (ref 0.0–0.5)
Eosinophils Relative: 0 %
HCT: 37.6 % — ABNORMAL LOW (ref 39.0–52.0)
Hemoglobin: 12.2 g/dL — ABNORMAL LOW (ref 13.0–17.0)
Immature Granulocytes: 1 %
Lymphocytes Relative: 10 %
Lymphs Abs: 0.8 10*3/uL (ref 0.7–4.0)
MCH: 31.1 pg (ref 26.0–34.0)
MCHC: 32.4 g/dL (ref 30.0–36.0)
MCV: 95.9 fL (ref 80.0–100.0)
Monocytes Absolute: 0.3 10*3/uL (ref 0.1–1.0)
Monocytes Relative: 4 %
Neutro Abs: 6.7 10*3/uL (ref 1.7–7.7)
Neutrophils Relative %: 85 %
Platelets: 291 10*3/uL (ref 150–400)
RBC: 3.92 MIL/uL — ABNORMAL LOW (ref 4.22–5.81)
RDW: 13.8 % (ref 11.5–15.5)
WBC: 7.9 10*3/uL (ref 4.0–10.5)
nRBC: 0 % (ref 0.0–0.2)

## 2022-08-06 LAB — COMPREHENSIVE METABOLIC PANEL
ALT: 24 U/L (ref 0–44)
AST: 30 U/L (ref 15–41)
Albumin: 4.6 g/dL (ref 3.5–5.0)
Alkaline Phosphatase: 61 U/L (ref 38–126)
Anion gap: 15 (ref 5–15)
BUN: 22 mg/dL — ABNORMAL HIGH (ref 6–20)
CO2: 21 mmol/L — ABNORMAL LOW (ref 22–32)
Calcium: 10 mg/dL (ref 8.9–10.3)
Chloride: 99 mmol/L (ref 98–111)
Creatinine, Ser: 1.19 mg/dL (ref 0.61–1.24)
GFR, Estimated: >60 mL/min (ref >60)
Glucose, Bld: 226 mg/dL — ABNORMAL HIGH (ref 70–99)
Potassium: 4 mmol/L (ref 3.5–5.1)
Sodium: 135 mmol/L (ref 135–145)
Total Bilirubin: 0.8 mg/dL (ref 0.3–1.2)
Total Protein: 7.9 g/dL (ref 6.5–8.1)

## 2022-08-06 LAB — RAPID URINE DRUG SCREEN, HOSP PERFORMED
Amphetamines: NOT DETECTED
Barbiturates: NOT DETECTED
Benzodiazepines: NOT DETECTED
Cocaine: NOT DETECTED
Opiates: NOT DETECTED
Tetrahydrocannabinol: POSITIVE — AB

## 2022-08-06 LAB — LIPASE, BLOOD: Lipase: 46 U/L (ref 11–51)

## 2022-08-06 LAB — ETHANOL: Alcohol, Ethyl (B): <10 mg/dL (ref <10)

## 2022-08-06 MED ORDER — CAPSAICIN 0.025 % EX CREA
TOPICAL_CREAM | Freq: Once | CUTANEOUS | Status: AC
  Filled 2022-08-06: qty 60

## 2022-08-06 MED ORDER — MORPHINE SULFATE (PF) 4 MG/ML IV SOLN
4.0000 mg | Freq: Once | INTRAVENOUS | Status: AC
  Administered 2022-08-06: 4 mg via INTRAVENOUS
  Filled 2022-08-06: qty 1

## 2022-08-06 MED ORDER — HALOPERIDOL LACTATE 5 MG/ML IJ SOLN
5.0000 mg | Freq: Once | INTRAMUSCULAR | Status: AC
  Administered 2022-08-06: 5 mg via INTRAVENOUS
  Filled 2022-08-06: qty 1

## 2022-08-06 MED ORDER — SODIUM CHLORIDE 0.9 % IV BOLUS
1000.0000 mL | Freq: Once | INTRAVENOUS | Status: AC
  Administered 2022-08-06: 1000 mL via INTRAVENOUS

## 2022-08-06 MED ORDER — ONDANSETRON HCL 4 MG/2ML IJ SOLN
4.0000 mg | Freq: Once | INTRAMUSCULAR | Status: AC
  Administered 2022-08-06: 4 mg via INTRAVENOUS
  Filled 2022-08-06: qty 2

## 2022-08-06 NOTE — ED Triage Notes (Signed)
Patient arrives from home via EMS for eval of generalized abdominal pain, n/v that started this morning after he took his diabetes medications. Patient bradycardic with EMS, heart rate 45. EMS gave 1mg  atropine with improvement to >100. Appears very uncomfortable in triage. States hx of this pain and being seen here for same.

## 2022-08-06 NOTE — ED Provider Notes (Signed)
[place] [facility] AT [facility] Provider Note   CSN: [id number] Arrival date & time: 08/06/22  1418     History  Chief Complaint  Patient presents with   Abdominal Pain   Bradycardia    James Bradford is a 57 y.o. male.  The history is provided by the patient, the EMS personnel and medical records. No language interpreter was used.  Abdominal Pain    57 year old male with significant history sinus bradycardia, hypertension, CKD, diabetes, hyperlipidemia marijuana abuse brought here via EMS from home with complaint of abdominal pain.  Per EMS, patient complained of generalized abdominal pain with associate nausea vomiting diarrhea this morning after he took his diabetic medication.  EMS noted that his heart rate was low at 45 and patient received 1 mg of atropine by EMS with improvement of his heart rate.  Patient states he ran out of his medication for approximately a week and did resume taking his medication again a week ago.  Since starting his medication he endorsed having recurrent abdominal discomfort with bouts of nausea which became much more intense today.  States he had vomiting and diarrhea as well.  He is having chills, and felt uncomfortable.  Denies fever chest pain or shortness of breath no urinary symptoms no rectal bleeding.  Admits to marijuana use and states last use was a week ago.  He felt his symptoms due to his medication.  Home Medications Prior to Admission medications   Medication Sig Start Date End Date Taking? Authorizing Provider  glipiZIDE (GLUCOTROL) 10 MG tablet Take 1 tablet (10 mg total) by mouth 2 (two) times daily before a meal. 03/22/22   Rodolph Bong, MD  insulin degludec (TRESIBA FLEXTOUCH) 200 UNIT/ML FlexTouch Pen Inject 10 Units into the skin daily. Patient not taking: Reported on 06/17/2022 03/22/22   Rodolph Bong, MD  lisinopril (ZESTRIL) 10 MG tablet Take 10 mg by mouth at bedtime.    [provider]   metFORMIN (GLUCOPHAGE) 1000 MG tablet Take 1 tablet (1,000 mg total) by mouth 2 (two) times daily with a meal. 03/22/22   Rodolph Bong, MD  Multiple Vitamins-Minerals (MENS 50+ MULTIVITAMIN) TABS Take 1 tablet by mouth daily.    [provider]  ondansetron (ZOFRAN) 4 MG tablet Take 1 tablet (4 mg total) by mouth every 8 (eight) hours as needed for nausea or vomiting. 08/03/22   Darrick Grinder, PA-C  pantoprazole (PROTONIX) 40 MG tablet Take 1 tablet (40 mg total) by mouth 2 (two) times daily before a meal. Patient taking differently: Take 40 mg by mouth at bedtime. 03/22/22   Rodolph Bong, MD  pravastatin (PRAVACHOL) 10 MG tablet Take 1 tablet (10 mg total) by mouth daily. Patient taking differently: Take 10 mg by mouth at bedtime. 03/22/22   Rodolph Bong, MD  sildenafil (VIAGRA) 100 MG tablet Take 100 mg by mouth daily as needed for erectile dysfunction.    [provider]      Allergies    Ibuprofen, Pork-derived products, and Asa [aspirin]    Review of Systems   Review of Systems  Gastrointestinal:  Positive for abdominal pain.  All other systems reviewed and are negative.   Physical Exam Updated Vital Signs BP 134/84   Pulse 72   Temp 98.1 F (36.7 C) (Oral)   Resp 13   SpO2 100%  Physical Exam Vitals and nursing note reviewed.  Constitutional:      General: He is not  in acute distress.    Appearance: He is well-developed.  HENT:     Head: Atraumatic.  Eyes:     Conjunctiva/sclera: Conjunctivae normal.  Cardiovascular:     Rate and Rhythm: Tachycardia present.  Pulmonary:     Effort: Pulmonary effort is normal.     Breath sounds: Normal breath sounds.  Abdominal:     General: Bowel sounds are normal.     Palpations: Abdomen is soft.     Tenderness: There is generalized abdominal tenderness.     Hernia: No hernia is present.  Musculoskeletal:     Cervical back: Neck supple.  Skin:    Findings: No rash.  Neurological:      Mental Status: He is alert.     ED Results / Procedures / Treatments   Labs (all labs ordered are listed, but only abnormal results are displayed) Labs Reviewed  CBC WITH DIFFERENTIAL/PLATELET - Abnormal; Notable for the following components:      Result Value   RBC 3.92 (*)    Hemoglobin 12.2 (*)    HCT 37.6 (*)    All other components within normal limits  COMPREHENSIVE METABOLIC PANEL - Abnormal; Notable for the following components:   CO2 21 (*)    Glucose, Bld 226 (*)    BUN 22 (*)    All other components within normal limits  URINALYSIS, ROUTINE W REFLEX MICROSCOPIC - Abnormal; Notable for the following components:   Glucose, UA 150 (*)    Ketones, ur 5 (*)    Protein, ur 30 (*)    All other components within normal limits  RAPID URINE DRUG SCREEN, HOSP PERFORMED - Abnormal; Notable for the following components:   Tetrahydrocannabinol POSITIVE (*)    All other components within normal limits  LIPASE, BLOOD  ETHANOL    EKG EKG Interpretation Date/Time:  Wednesday August 06 2022 14:37:50 EDT Ventricular Rate:  115 PR Interval:  158 QRS Duration:  84 QT Interval:  314 QTC Calculation: 435 R Axis:   40  Text Interpretation: Sinus tachycardia Probable anteroseptal infarct, old Confirmed by Kristine Royal [phone]) on 08/06/2022 4:21:02 PM ED ECG REPORT   Date: 08/06/2022  Rate: 115  Rhythm: sinus tachycardia  QRS Axis: normal  Intervals: normal  ST/T Wave abnormalities: nonspecific ST changes  Conduction Disutrbances:none  Narrative Interpretation:   Old EKG Reviewed: unchanged  I have personally reviewed the EKG tracing and agree with the computerized printout as noted.   Radiology No results found.  Procedures Procedures    Medications Ordered in ED Medications  sodium chloride 0.9 % bolus 1,000 mL (1,000 mLs Intravenous New Bag/Given 08/06/22 1453)  morphine (PF) 4 MG/ML injection 4 mg (4 mg Intravenous Given 08/06/22 1454)  ondansetron (ZOFRAN) injection  4 mg (4 mg Intravenous Given 08/06/22 1454)  haloperidol lactate (HALDOL) injection 5 mg (5 mg Intravenous Given 08/06/22 1600)    ED Course/ Medical Decision Making/ A&P                             Medical Decision Making Amount and/or Complexity of Data Reviewed Labs: ordered.  Risk Prescription drug management.   There were no vitals taken for this visit.  31:34 PM  57 year old male with significant history sinus bradycardia, hypertension, CKD, diabetes, hyperlipidemia marijuana abuse brought here via EMS from home with complaint of abdominal pain.  Per EMS, patient complained of generalized abdominal pain with associate nausea vomiting diarrhea this morning  after he took his diabetic medication.  EMS noted that his heart rate was low at 45 and patient received 1 mg of atropine by EMS with improvement of his heart rate.  Patient states he ran out of his medication for approximately a week and did resume taking his medication again a week ago.  Since starting his medication he endorsed having recurrent abdominal discomfort with bouts of nausea which became much more intense today.  States he had vomiting and diarrhea as well.  He is having chills, and felt uncomfortable.  Denies fever chest pain or shortness of breath no urinary symptoms no rectal bleeding.  Admits to marijuana use and states last use was a week ago.  He felt his symptoms due to his medication.  On exam patient appears uncomfortable, holding an emesis bag but not actively vomiting.  Abdomen is diffusely tender, heart with tachycardia lungs clear to auscultation  -Labs ordered, independently viewed and interpreted by me.  Labs remarkable for CBG 226 with normal anion gap and no evidence of DKA.  UDS positive for THC, suspect cannabinoid hyperemesis syndrome -The patient was maintained on a cardiac monitor.  I personally viewed and interpreted the cardiac monitored which showed an underlying rhythm of: NSR -Imaging including  abd/pelvis CT considered but abdominal exam is fairly benign -This patient presents to the ED for concern of abd pain, this involves an extensive number of treatment options, and is a complaint that carries with it a high risk of complications and morbidity.  The differential diagnosis includes cannabinoid hyperemesis syndrome, viral GI, colitis, diverticulitis, GERD, appendicitis, cholecystitis, PNA, MI, DKA -Co morbidities that complicate the patient evaluation includes THC use, CKD, DM, alcohol abuse -Treatment includes IVF, morphine, zofran, haldol -Reevaluation of the patient after these medicines showed that the patient improved -PCP office notes or outside notes reviewed -Escalation to admission/observation considered: patients feels much better, is comfortable with discharge, and will follow up with PCP -Prescription medication considered, patient comfortable with capsaicin cream -Social Determinant of Health considered          Final Clinical Impression(s) / ED Diagnoses Final diagnoses:  Cannabis 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

## 2022-08-06 NOTE — Discharge Instructions (Signed)
Your symptoms likely due to due to marijuana use.  Please avoid marijuana as it will likely worsen your condition.  You may use capsaicin cream and apply to the abdominal wall as needed when you experience pain.  Make sure to wash your hands immediately and do not rub the eyes or your genital while using the cream as it can cause discomfort.  Return to the ER if you have any concern.

## 2022-08-15 ENCOUNTER — Emergency Department (HOSPITAL_COMMUNITY): Payer: BLUE CROSS/BLUE SHIELD

## 2022-08-15 ENCOUNTER — Other Ambulatory Visit: Payer: Self-pay

## 2022-08-15 ENCOUNTER — Emergency Department (HOSPITAL_COMMUNITY)
Admission: EM | Admit: 2022-08-15 | Discharge: 2022-08-15 | Disposition: A | Discharge status: Home / Self Care | Payer: BLUE CROSS/BLUE SHIELD | Attending: Emergency Medicine | Admitting: Emergency Medicine

## 2022-08-15 DIAGNOSIS — E1122 Type 2 diabetes mellitus with diabetic chronic kidney disease: Secondary | ICD-10-CM | POA: Insufficient documentation

## 2022-08-15 DIAGNOSIS — M25531 Pain in right wrist: Secondary | ICD-10-CM | POA: Insufficient documentation

## 2022-08-15 DIAGNOSIS — M25532 Pain in left wrist: Secondary | ICD-10-CM | POA: Insufficient documentation

## 2022-08-15 DIAGNOSIS — Z7984 Long term (current) use of oral hypoglycemic drugs: Secondary | ICD-10-CM | POA: Insufficient documentation

## 2022-08-15 DIAGNOSIS — Z794 Long term (current) use of insulin: Secondary | ICD-10-CM | POA: Insufficient documentation

## 2022-08-15 DIAGNOSIS — N183 Chronic kidney disease, stage 3 unspecified: Secondary | ICD-10-CM | POA: Insufficient documentation

## 2022-08-15 MED ORDER — ACETAMINOPHEN 500 MG PO TABS
1000.0000 mg | ORAL_TABLET | Freq: Once | ORAL | Status: AC
  Administered 2022-08-15: 1000 mg via ORAL
  Filled 2022-08-15: qty 2
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
## 2022-09-01 ENCOUNTER — Emergency Department (HOSPITAL_COMMUNITY): Payer: BLUE CROSS/BLUE SHIELD

## 2022-09-01 ENCOUNTER — Other Ambulatory Visit: Payer: Self-pay

## 2022-09-01 ENCOUNTER — Encounter (HOSPITAL_COMMUNITY): Payer: Self-pay | Admitting: Radiology

## 2022-09-01 ENCOUNTER — Inpatient Hospital Stay (HOSPITAL_COMMUNITY)
Admission: EM | Admit: 2022-09-01 | Discharge: 2022-09-05 | DRG: 638 | Disposition: A | Discharge status: Home / Self Care | Payer: BLUE CROSS/BLUE SHIELD | Attending: Internal Medicine | Admitting: Internal Medicine

## 2022-09-01 DIAGNOSIS — Z91014 Allergy to mammalian meats: Secondary | ICD-10-CM

## 2022-09-01 DIAGNOSIS — R1084 Generalized abdominal pain: Secondary | ICD-10-CM | POA: Diagnosis not present

## 2022-09-01 DIAGNOSIS — E1169 Type 2 diabetes mellitus with other specified complication: Secondary | ICD-10-CM | POA: Diagnosis present

## 2022-09-01 DIAGNOSIS — N1831 Chronic kidney disease, stage 3a: Secondary | ICD-10-CM | POA: Diagnosis present

## 2022-09-01 DIAGNOSIS — K3184 Gastroparesis: Secondary | ICD-10-CM | POA: Diagnosis present

## 2022-09-01 DIAGNOSIS — N189 Chronic kidney disease, unspecified: Secondary | ICD-10-CM | POA: Diagnosis not present

## 2022-09-01 DIAGNOSIS — Z8249 Family history of ischemic heart disease and other diseases of the circulatory system: Secondary | ICD-10-CM | POA: Diagnosis not present

## 2022-09-01 DIAGNOSIS — E1159 Type 2 diabetes mellitus with other circulatory complications: Secondary | ICD-10-CM | POA: Diagnosis not present

## 2022-09-01 DIAGNOSIS — E11649 Type 2 diabetes mellitus with hypoglycemia without coma: Secondary | ICD-10-CM | POA: Diagnosis present

## 2022-09-01 DIAGNOSIS — N179 Acute kidney failure, unspecified: Secondary | ICD-10-CM | POA: Diagnosis present

## 2022-09-01 DIAGNOSIS — E860 Dehydration: Secondary | ICD-10-CM | POA: Diagnosis present

## 2022-09-01 DIAGNOSIS — E872 Acidosis, unspecified: Secondary | ICD-10-CM | POA: Diagnosis present

## 2022-09-01 DIAGNOSIS — I700 Atherosclerosis of aorta: Secondary | ICD-10-CM | POA: Diagnosis present

## 2022-09-01 DIAGNOSIS — D72829 Elevated white blood cell count, unspecified: Secondary | ICD-10-CM | POA: Diagnosis present

## 2022-09-01 DIAGNOSIS — Z7984 Long term (current) use of oral hypoglycemic drugs: Secondary | ICD-10-CM

## 2022-09-01 DIAGNOSIS — H409 Unspecified glaucoma: Secondary | ICD-10-CM | POA: Diagnosis present

## 2022-09-01 DIAGNOSIS — I129 Hypertensive chronic kidney disease with stage 1 through stage 4 chronic kidney disease, or unspecified chronic kidney disease: Secondary | ICD-10-CM | POA: Diagnosis not present

## 2022-09-01 DIAGNOSIS — Z794 Long term (current) use of insulin: Secondary | ICD-10-CM | POA: Diagnosis not present

## 2022-09-01 DIAGNOSIS — Z886 Allergy status to analgesic agent status: Secondary | ICD-10-CM | POA: Diagnosis not present

## 2022-09-01 DIAGNOSIS — I152 Hypertension secondary to endocrine disorders: Secondary | ICD-10-CM | POA: Diagnosis present

## 2022-09-01 DIAGNOSIS — E1143 Type 2 diabetes mellitus with diabetic autonomic (poly)neuropathy: Secondary | ICD-10-CM | POA: Diagnosis present

## 2022-09-01 DIAGNOSIS — E785 Hyperlipidemia, unspecified: Secondary | ICD-10-CM | POA: Diagnosis present

## 2022-09-01 DIAGNOSIS — R112 Nausea with vomiting, unspecified: Secondary | ICD-10-CM | POA: Diagnosis not present

## 2022-09-01 DIAGNOSIS — E1110 Type 2 diabetes mellitus with ketoacidosis without coma: Principal | ICD-10-CM | POA: Diagnosis present

## 2022-09-01 DIAGNOSIS — Z79899 Other long term (current) drug therapy: Secondary | ICD-10-CM | POA: Diagnosis not present

## 2022-09-01 DIAGNOSIS — R109 Unspecified abdominal pain: Secondary | ICD-10-CM | POA: Diagnosis present

## 2022-09-01 DIAGNOSIS — E1122 Type 2 diabetes mellitus with diabetic chronic kidney disease: Secondary | ICD-10-CM | POA: Diagnosis present

## 2022-09-01 DIAGNOSIS — Z66 Do not resuscitate: Secondary | ICD-10-CM | POA: Diagnosis present

## 2022-09-01 LAB — CBC WITH DIFFERENTIAL/PLATELET
Abs Immature Granulocytes: 0.05 10*3/uL (ref 0.00–0.07)
Basophils Absolute: 0 10*3/uL (ref 0.0–0.1)
Basophils Relative: 0 %
Eosinophils Absolute: 0 10*3/uL (ref 0.0–0.5)
Eosinophils Relative: 0 %
HCT: 42.1 % (ref 39.0–52.0)
Hemoglobin: 13.8 g/dL (ref 13.0–17.0)
Immature Granulocytes: 0 %
Lymphocytes Relative: 7 %
Lymphs Abs: 0.9 10*3/uL (ref 0.7–4.0)
MCH: 31.7 pg (ref 26.0–34.0)
MCHC: 32.8 g/dL (ref 30.0–36.0)
MCV: 96.6 fL (ref 80.0–100.0)
Monocytes Absolute: 0.7 10*3/uL (ref 0.1–1.0)
Monocytes Relative: 5 %
Neutro Abs: 11.3 10*3/uL — ABNORMAL HIGH (ref 1.7–7.7)
Neutrophils Relative %: 88 %
Platelets: 368 10*3/uL (ref 150–400)
RBC: 4.36 MIL/uL (ref 4.22–5.81)
RDW: 13.2 % (ref 11.5–15.5)
WBC: 13 10*3/uL — ABNORMAL HIGH (ref 4.0–10.5)
nRBC: 0 % (ref 0.0–0.2)

## 2022-09-01 LAB — I-STAT CG4 LACTIC ACID, ED
Lactic Acid, Venous: 4.7 mmol/L (ref 0.5–1.9)
Lactic Acid, Venous: 5.8 mmol/L (ref 0.5–1.9)

## 2022-09-01 LAB — BLOOD GAS, VENOUS
Acid-base deficit: 8.6 mmol/L — ABNORMAL HIGH (ref 0.0–2.0)
Bicarbonate: 16.6 mmol/L — ABNORMAL LOW (ref 20.0–28.0)
O2 Saturation: 30.9 %
Patient temperature: 37
pCO2, Ven: 33 mmHg — ABNORMAL LOW (ref 44–60)
pH, Ven: 7.31 (ref 7.25–7.43)
pO2, Ven: <31 mmHg — CL (ref 32–45)

## 2022-09-01 LAB — URINALYSIS, ROUTINE W REFLEX MICROSCOPIC
Bacteria, UA: NONE SEEN
Bilirubin Urine: NEGATIVE
Glucose, UA: >=500 mg/dL — AB
Ketones, ur: 80 mg/dL — AB
Leukocytes,Ua: NEGATIVE
Nitrite: NEGATIVE
Protein, ur: 100 mg/dL — AB
Specific Gravity, Urine: 1.01 (ref 1.005–1.030)
pH: 5 (ref 5.0–8.0)

## 2022-09-01 LAB — BASIC METABOLIC PANEL
Anion gap: 22 — ABNORMAL HIGH (ref 5–15)
BUN: 35 mg/dL — ABNORMAL HIGH (ref 6–20)
CO2: 18 mmol/L — ABNORMAL LOW (ref 22–32)
Calcium: 8.9 mg/dL (ref 8.9–10.3)
Chloride: 99 mmol/L (ref 98–111)
Creatinine, Ser: 2.72 mg/dL — ABNORMAL HIGH (ref 0.61–1.24)
GFR, Estimated: 27 mL/min — ABNORMAL LOW (ref >60)
Glucose, Bld: 209 mg/dL — ABNORMAL HIGH (ref 70–99)
Potassium: 4.9 mmol/L (ref 3.5–5.1)
Sodium: 139 mmol/L (ref 135–145)

## 2022-09-01 LAB — CBG MONITORING, ED
Glucose-Capillary: 103 mg/dL — ABNORMAL HIGH (ref 70–99)
Glucose-Capillary: 118 mg/dL — ABNORMAL HIGH (ref 70–99)
Glucose-Capillary: 215 mg/dL — ABNORMAL HIGH (ref 70–99)
Glucose-Capillary: 310 mg/dL — ABNORMAL HIGH (ref 70–99)
Glucose-Capillary: 326 mg/dL — ABNORMAL HIGH (ref 70–99)

## 2022-09-01 LAB — COMPREHENSIVE METABOLIC PANEL
ALT: 35 U/L (ref 0–44)
AST: 43 U/L — ABNORMAL HIGH (ref 15–41)
Albumin: 5.3 g/dL — ABNORMAL HIGH (ref 3.5–5.0)
Alkaline Phosphatase: 77 U/L (ref 38–126)
Anion gap: 29 — ABNORMAL HIGH (ref 5–15)
BUN: 38 mg/dL — ABNORMAL HIGH (ref 6–20)
CO2: 16 mmol/L — ABNORMAL LOW (ref 22–32)
Calcium: 9.9 mg/dL (ref 8.9–10.3)
Chloride: 93 mmol/L — ABNORMAL LOW (ref 98–111)
Creatinine, Ser: 2.89 mg/dL — ABNORMAL HIGH (ref 0.61–1.24)
GFR, Estimated: 25 mL/min — ABNORMAL LOW (ref >60)
Glucose, Bld: 339 mg/dL — ABNORMAL HIGH (ref 70–99)
Potassium: 4.6 mmol/L (ref 3.5–5.1)
Sodium: 138 mmol/L (ref 135–145)
Total Bilirubin: 1.3 mg/dL — ABNORMAL HIGH (ref 0.3–1.2)
Total Protein: 9.1 g/dL — ABNORMAL HIGH (ref 6.5–8.1)

## 2022-09-01 LAB — BETA-HYDROXYBUTYRIC ACID: Beta-Hydroxybutyric Acid: 7.61 mmol/L — ABNORMAL HIGH (ref 0.05–0.27)

## 2022-09-01 LAB — LIPASE, BLOOD: Lipase: 39 U/L (ref 11–51)

## 2022-09-01 MED ORDER — LACTATED RINGERS IV BOLUS
20.0000 mL/kg | Freq: Once | INTRAVENOUS | Status: AC
  Administered 2022-09-01: 1360 mL via INTRAVENOUS

## 2022-09-01 MED ORDER — DEXTROSE 50 % IV SOLN: 0.0000 mL | INTRAVENOUS | Status: DC | PRN

## 2022-09-01 MED ORDER — ONDANSETRON HCL 4 MG/2ML IJ SOLN
4.0000 mg | Freq: Four times a day (QID) | INTRAMUSCULAR | Status: DC | PRN
  Administered 2022-09-02 – 2022-09-05 (×11): 4 mg via INTRAVENOUS
  Filled 2022-09-01 (×11): qty 2

## 2022-09-01 MED ORDER — PANTOPRAZOLE SODIUM 40 MG IV SOLR
40.0000 mg | Freq: Once | INTRAVENOUS | Status: AC
  Administered 2022-09-01: 40 mg via INTRAVENOUS
  Filled 2022-09-01: qty 10

## 2022-09-01 MED ORDER — PRAVASTATIN SODIUM 20 MG PO TABS
10.0000 mg | ORAL_TABLET | Freq: Every day | ORAL | Status: DC
  Administered 2022-09-01 – 2022-09-04 (×4): 10 mg via ORAL
  Filled 2022-09-01 (×4): qty 1

## 2022-09-01 MED ORDER — METOCLOPRAMIDE HCL 5 MG/ML IJ SOLN
10.0000 mg | Freq: Once | INTRAMUSCULAR | Status: AC
  Administered 2022-09-01: 10 mg via INTRAVENOUS
  Filled 2022-09-01: qty 2

## 2022-09-01 MED ORDER — LACTATED RINGERS IV SOLN: INTRAVENOUS | Status: DC

## 2022-09-01 MED ORDER — DEXTROSE IN LACTATED RINGERS 5 % IV SOLN: INTRAVENOUS | Status: DC

## 2022-09-01 MED ORDER — MORPHINE SULFATE (PF) 4 MG/ML IV SOLN
4.0000 mg | Freq: Once | INTRAVENOUS | Status: DC
  Filled 2022-09-01: qty 1

## 2022-09-01 MED ORDER — HYDROMORPHONE HCL 1 MG/ML IJ SOLN
1.0000 mg | Freq: Once | INTRAMUSCULAR | Status: AC
  Administered 2022-09-01: 1 mg via INTRAVENOUS
  Filled 2022-09-01: qty 1

## 2022-09-01 MED ORDER — POTASSIUM CHLORIDE 10 MEQ/100ML IV SOLN
10.0000 meq | INTRAVENOUS | Status: AC
  Administered 2022-09-01 (×2): 10 meq via INTRAVENOUS
  Filled 2022-09-01: qty 100

## 2022-09-01 MED ORDER — DIPHENHYDRAMINE HCL 25 MG PO CAPS
25.0000 mg | ORAL_CAPSULE | Freq: Once | ORAL | Status: AC
  Administered 2022-09-01: 25 mg via ORAL
  Filled 2022-09-01: qty 1

## 2022-09-01 MED ORDER — LACTATED RINGERS IV BOLUS
1000.0000 mL | Freq: Once | INTRAVENOUS | Status: AC
  Administered 2022-09-01: 1000 mL via INTRAVENOUS

## 2022-09-01 MED ORDER — INSULIN REGULAR(HUMAN) IN NACL 100-0.9 UT/100ML-% IV SOLN
INTRAVENOUS | Status: DC
  Administered 2022-09-01: 8.5 [IU]/h via INTRAVENOUS
  Filled 2022-09-01: qty 100

## 2022-09-01 MED ORDER — INSULIN REGULAR(HUMAN) IN NACL 100-0.9 UT/100ML-% IV SOLN: INTRAVENOUS | Status: AC

## 2022-09-01 MED ORDER — POTASSIUM CHLORIDE 10 MEQ/100ML IV SOLN
10.0000 meq | INTRAVENOUS | Status: AC
  Administered 2022-09-02 (×2): 10 meq via INTRAVENOUS
  Filled 2022-09-01: qty 100

## 2022-09-01 MED ORDER — DEXTROSE IN LACTATED RINGERS 5 % IV SOLN: INTRAVENOUS | Status: AC

## 2022-09-01 NOTE — ED Notes (Signed)
Pt notified of need for urine sample. 

## 2022-09-01 NOTE — ED Provider Notes (Signed)
[place] EMERGENCY DEPARTMENT AT [facility] Provider Note   CSN: [id number] Arrival date & time: 09/01/22  4098     History  Chief Complaint  Patient presents with   Emesis   Hyperglycemia    James Bradford is a 57 y.o. male.  HPI 57 year old male with a history of type 2 diabetes and marijuana abuse presents with vomiting.  States around 10 AM after trying take his morning meds he has had intractable vomiting.  He is also having upper abdominal pain.  This is a recurrent issue for him.  He had many prior episodes like this.  No chest pain, shortness of breath or diarrhea.  He was given IM Zofran by EMS.  He started having a little bit of blood in the emesis after getting here.  Home Medications Prior to Admission medications   Medication Sig Start Date End Date Taking? Authorizing Provider  glipiZIDE (GLUCOTROL) 10 MG tablet Take 1 tablet (10 mg total) by mouth 2 (two) times daily before a meal. 03/22/22   Rodolph Bong, MD  insulin degludec (TRESIBA FLEXTOUCH) 200 UNIT/ML FlexTouch Pen Inject 10 Units into the skin daily. Patient not taking: Reported on 06/17/2022 03/22/22   Rodolph Bong, MD  lisinopril (ZESTRIL) 10 MG tablet Take 10 mg by mouth at bedtime.    [provider]  metFORMIN (GLUCOPHAGE) 1000 MG tablet Take 1 tablet (1,000 mg total) by mouth 2 (two) times daily with a meal. 03/22/22   Rodolph Bong, MD  Multiple Vitamins-Minerals (MENS 50+ MULTIVITAMIN) TABS Take 1 tablet by mouth daily.    [provider]  ondansetron (ZOFRAN) 4 MG tablet Take 1 tablet (4 mg total) by mouth every 8 (eight) hours as needed for nausea or vomiting. 08/03/22   Darrick Grinder, PA-C  pantoprazole (PROTONIX) 40 MG tablet Take 1 tablet (40 mg total) by mouth 2 (two) times daily before a meal. Patient taking differently: Take 40 mg by mouth at bedtime. 03/22/22   Rodolph Bong, MD  pravastatin (PRAVACHOL) 10 MG tablet Take 1 tablet (10 mg total)  by mouth daily. Patient taking differently: Take 10 mg by mouth at bedtime. 03/22/22   Rodolph Bong, MD  sildenafil (VIAGRA) 100 MG tablet Take 100 mg by mouth daily as needed for erectile dysfunction.    [provider]      Allergies    Ibuprofen, Pork-derived products, and Asa [aspirin]    Review of Systems   Review of Systems  Constitutional:  Negative for fever.  Respiratory:  Negative for shortness of breath.   Cardiovascular:  Negative for chest pain.  Gastrointestinal:  Positive for abdominal pain, nausea and vomiting. Negative for diarrhea.    Physical Exam Updated Vital Signs BP (!) 162/91 (BP Location: Right Arm)   Pulse 77   Temp 98.1 F (36.7 C) (Oral)   Resp 20   Ht 5\' 7"  (1.702 m)   Wt 68 kg   SpO2 100%   BMI 23.49 kg/m  Physical Exam Vitals and nursing note reviewed.  Constitutional:      Appearance: Normal appearance. He is well-developed. He is diaphoretic.  HENT:     Head: Normocephalic and atraumatic.  Cardiovascular:     Rate and Rhythm: Normal rate and regular rhythm.     Heart sounds: Normal heart sounds.  Pulmonary:     Effort: Pulmonary effort is normal.     Breath sounds: Normal breath sounds.  Abdominal:  Palpations: Abdomen is soft.     Tenderness: There is abdominal tenderness in the epigastric area and left upper quadrant.  Skin:    General: Skin is warm.  Neurological:     Mental Status: He is alert.     ED Results / Procedures / Treatments   Labs (all labs ordered are listed, but only abnormal results are displayed) Labs Reviewed  COMPREHENSIVE METABOLIC PANEL - Abnormal; Notable for the following components:      Result Value   Chloride 93 (*)    CO2 16 (*)    Glucose, Bld 339 (*)    BUN 38 (*)    Creatinine, Ser 2.89 (*)    Total Protein 9.1 (*)    Albumin 5.3 (*)    AST 43 (*)    Total Bilirubin 1.3 (*)    GFR, Estimated 25 (*)    Anion gap 29 (*)    All other components within normal limits  CBC  WITH DIFFERENTIAL/PLATELET - Abnormal; Notable for the following components:   WBC 13.0 (*)    Neutro Abs 11.3 (*)    All other components within normal limits  BLOOD GAS, VENOUS - Abnormal; Notable for the following components:   pCO2, Ven 33 (*)    pO2, Ven <31 (*)    Bicarbonate 16.6 (*)    Acid-base deficit 8.6 (*)    All other components within normal limits  BETA-HYDROXYBUTYRIC ACID - Abnormal; Notable for the following components:   Beta-Hydroxybutyric Acid 7.61 (*)    All other components within normal limits  BASIC METABOLIC PANEL - Abnormal; Notable for the following components:   CO2 18 (*)    Glucose, Bld 209 (*)    BUN 35 (*)    Creatinine, Ser 2.72 (*)    GFR, Estimated 27 (*)    Anion gap 22 (*)    All other components within normal limits  CBG MONITORING, ED - Abnormal; Notable for the following components:   Glucose-Capillary 326 (*)    All other components within normal limits  I-STAT CG4 LACTIC ACID, ED - Abnormal; Notable for the following components:   Lactic Acid, Venous 4.7 (*)    All other components within normal limits  CBG MONITORING, ED - Abnormal; Notable for the following components:   Glucose-Capillary 310 (*)    All other components within normal limits  CBG MONITORING, ED - Abnormal; Notable for the following components:   Glucose-Capillary 215 (*)    All other components within normal limits  LIPASE, BLOOD  URINALYSIS, ROUTINE W REFLEX MICROSCOPIC  BASIC METABOLIC PANEL  BASIC METABOLIC PANEL  BASIC METABOLIC PANEL  BETA-HYDROXYBUTYRIC ACID  I-STAT CG4 LACTIC ACID, ED    EKG EKG Interpretation Date/Time:  Monday September 01 2022 19:21:28 EDT Ventricular Rate:  55 PR Interval:  83 QRS Duration:  83 QT Interval:  452 QTC Calculation: 433 R Axis:   37  Text Interpretation: Sinus rhythm Short PR interval Borderline ST depression, diffuse leads ST changes similar to April 2024 Confirmed by Pricilla Loveless [phone]) on 09/01/2022 7:23:17  PM  Radiology DG Chest Portable 1 View  Result Date: 09/01/2022 CLINICAL DATA:  Nausea and vomiting. EXAM: PORTABLE CHEST 1 VIEW COMPARISON:  Jun 17, 2022 FINDINGS: The heart size and mediastinal contours are within normal limits. Both lungs are clear. The visualized skeletal structures are unremarkable. IMPRESSION: No active disease. Electronically Signed   By: Aram Candela M.D.   On: 09/01/2022 20:44    Procedures .Critical  Care  Performed by: Pricilla Loveless, MD Authorized by: Pricilla Loveless, MD   Critical care provider statement:    Critical care time (minutes):  35   Critical care time was exclusive of:  Separately billable procedures and treating other patients   Critical care was necessary to treat or prevent imminent or life-threatening deterioration of the following conditions:  Endocrine crisis and renal failure   Critical care was time spent personally by me on the following activities:  Development of treatment plan with patient or surrogate, discussions with consultants, evaluation of patient's response to treatment, examination of patient, ordering and review of laboratory studies, ordering and review of radiographic studies, ordering and performing treatments and interventions, pulse oximetry, re-evaluation of patient's condition and review of old charts     Medications Ordered in ED Medications  insulin regular, human (MYXREDLIN) 100 units/ 100 mL infusion (3.2 Units/hr Intravenous Rate/Dose Change 09/01/22 2154)  lactated ringers infusion (0 mLs Intravenous Stopped 09/01/22 2158)  dextrose 5 % in lactated ringers infusion ( Intravenous New Bag/Given 09/01/22 2157)  dextrose 50 % solution 0-50 mL (has no administration in time range)  diphenhydrAMINE (BENADRYL) capsule 25 mg (has no administration in time range)  lactated ringers bolus 1,000 mL (0 mLs Intravenous Stopped 09/01/22 2136)  metoCLOPramide (REGLAN) injection 10 mg (10 mg Intravenous Given 09/01/22 1916)   pantoprazole (PROTONIX) injection 40 mg (40 mg Intravenous Given 09/01/22 1917)  HYDROmorphone (DILAUDID) injection 1 mg (1 mg Intravenous Given 09/01/22 2010)  lactated ringers bolus 1,000 mL (0 mLs Intravenous Stopped 09/01/22 2148)  potassium chloride 10 mEq in 100 mL IVPB (0 mEq Intravenous Stopped 09/01/22 2147)    ED Course/ Medical Decision Making/ A&P                             Medical Decision Making Amount and/or Complexity of Data Reviewed Labs: ordered.    Details: Acute kidney injury with significant metabolic acidosis.  Lactate 4.7.  Elevated beta hydroxybutyrate.  Leukocytosis is likely reactive. Radiology: ordered and independent interpretation performed.    Details: No CHF or pneumonia ECG/medicine tests: ordered and independent interpretation performed.    Details: Nonspecific changes but unchanged compared to prior.  Risk Prescription drug management. Decision regarding hospitalization.   Patient presents with vomiting.  Found to have acute kidney injury along with a significant metabolic acidosis.  Could be a combination of the dehydration versus DKA given that his glucose was over 300 as well.  Will treat him for both with IV fluids and start him on insulin drip.  He was given Reglan for nausea and some Dilaudid for pain.  This abdominal pain is a recurring issue for him and given it is in the upper abdomen without peritonitis, I do not think a CT imaging is needed.  He had what appeared to be some Mallory-Weiss bleeding and so he was given some Protonix but I doubt significant GI bleed.  He will be admitted to the hospital service, discussed with Dr. Mikeal Hawthorne.        Final Clinical Impression(s) / ED Diagnoses Final diagnoses:  Diabetic ketoacidosis without coma associated with type 2 diabetes mellitus (HCC)  Acute kidney injury [facility])    Rx / DC Orders ED Discharge Orders     None         Pricilla Loveless, MD 09/01/22 2248

## 2022-09-01 NOTE — H&P (Signed)
History and Physical    Patient: James Bradford UVO:[id number] DOB: 11-20-1965 DOA: 09/01/2022 DOS: the patient was seen and examined on 09/01/2022 PCP: Medicine, [facility]  Patient coming from: Home  Chief Complaint:  Chief Complaint  Patient presents with   Emesis   Hyperglycemia   HPI: Graylen Pollett is a 57 y.o. male with medical history significant of type 2 diabetes with gastroparesis, frequent DKA, hyperlipidemia, hypertension, marijuana abuse, chronic kidney disease stage IIIa and glaucoma who presents to the ER with intractable nausea vomiting.  Patient also complaining of abdominal pain.  Findings are consistent with his typical admissions.  Patient was seen and evaluated.  He was found to be dehydrated.  Has creatinine of 2.89.  His glucose is 339.  He has an anion gap of 29 and pH 7.31.  Patient appears to have diabetic ketoacidosis.  He is being admitted to the hospital for evaluation and treatment.  Review of Systems: As mentioned in the history of present illness. All other systems reviewed and are negative. Past Medical History:  Diagnosis Date   Aortic atherosclerosis (HCC) 08/24/2020   Hyperlipidemia    Hypertension    Marijuana abuse 09/28/2020   Sinus bradycardia    Stage 3 chronic kidney disease (HCC)    Type 2 diabetes mellitus (HCC)    Unspecified glaucoma 02/18/2008   Qualifier: Diagnosis of  By: Daphine Deutscher FNP, Zena Amos     Past Surgical History:  Procedure Laterality Date   HAND SURGERY     LEG SURGERY     Social History:  reports that he has never smoked. He has never used smokeless tobacco. He reports current drug use. Drug: Marijuana. He reports that he does not drink alcohol.  Allergies  Allergen Reactions   Ibuprofen Other (See Comments) and Nausea And Vomiting    Stomach pain  Other Reaction(s) from [facility]: Abdominal pain, Nausea   Pork-Derived Products Other (See Comments)    religious   Asa [Aspirin] Other (See Comments)     Stomach pain    Family History  Problem Relation Age of Onset   Hypertension Mother    Pancreatic cancer Father     Prior to Admission medications   Medication Sig Start Date End Date Taking? Authorizing Provider  glipiZIDE (GLUCOTROL) 10 MG tablet Take 1 tablet (10 mg total) by mouth 2 (two) times daily before a meal. 03/22/22   Rodolph Bong, MD  insulin degludec (TRESIBA FLEXTOUCH) 200 UNIT/ML FlexTouch Pen Inject 10 Units into the skin daily. Patient not taking: Reported on 06/17/2022 03/22/22   Rodolph Bong, MD  lisinopril (ZESTRIL) 10 MG tablet Take 10 mg by mouth at bedtime.    [provider]  metFORMIN (GLUCOPHAGE) 1000 MG tablet Take 1 tablet (1,000 mg total) by mouth 2 (two) times daily with a meal. 03/22/22   Rodolph Bong, MD  Multiple Vitamins-Minerals (MENS 50+ MULTIVITAMIN) TABS Take 1 tablet by mouth daily.    [provider]  ondansetron (ZOFRAN) 4 MG tablet Take 1 tablet (4 mg total) by mouth every 8 (eight) hours as needed for nausea or vomiting. 08/03/22   Darrick Grinder, PA-C  pantoprazole (PROTONIX) 40 MG tablet Take 1 tablet (40 mg total) by mouth 2 (two) times daily before a meal. Patient taking differently: Take 40 mg by mouth at bedtime. 03/22/22   Rodolph Bong, MD  pravastatin (PRAVACHOL) 10 MG tablet Take 1 tablet (10 mg total) by mouth daily. Patient taking differently: Take  10 mg by mouth at bedtime. 03/22/22   Rodolph Bong, MD  sildenafil (VIAGRA) 100 MG tablet Take 100 mg by mouth daily as needed for erectile dysfunction.    [provider]    Physical Exam: Vitals:   09/01/22 1847 09/01/22 1848 09/01/22 1849  BP:   (!) 162/91  Pulse:   77  Resp:   20  Temp:   98.1 F (36.7 C)  TempSrc:   Oral  SpO2: 100%  100%  Weight:  68 kg   Height:  5\' 7"  (1.702 m)    Constitutional: NAD, calm, comfortable Eyes: PERRL, lids and conjunctivae normal ENMT: Mucous membranes are moist. Posterior pharynx  clear of any exudate or lesions.Normal dentition.  Neck: normal, supple, no masses, no thyromegaly Respiratory: clear to auscultation bilaterally, no wheezing, no crackles. Normal respiratory effort. No accessory muscle use.  Cardiovascular: Regular rate and rhythm, no murmurs / rubs / gallops. No extremity edema. 2+ pedal pulses. No carotid bruits.  Abdomen: no tenderness, no masses palpated. No hepatosplenomegaly. Bowel sounds positive.  Musculoskeletal: Good range of motion, no joint swelling or tenderness, Skin: no rashes, lesions, ulcers. No induration Neurologic: CN 2-12 grossly intact. Sensation intact, DTR normal. Strength 5/5 in all 4.  Psychiatric: Normal judgment and insight. Alert and oriented x 3. Normal mood  Data Reviewed:  Chest x-ray is negative.  Sodium 138 potassium 4.0 chloride 93 CO2 of 16 glucose 339 BUN 38 and creatinine 2.89.  Gap is 29.  Albumin is 5.3.  AST 43.  White count is 13.0 normal CBC.  Lactic acid 4.7.  Beta hydroxybutyric acid currently pending  Assessment and Plan:  #1 diabetic ketoacidosis: Patient will be admitted to stepdown.  Initiate DKA protocol.  IV fluids, monitor potassium and replete accordingly.  Serial CBg hourly and adjustment of insulin dosing.  Initiate normal saline and changed to D5 normal saline when blood sugar is below 250.  Follow his anion gap until it closes.  Hydrate aggressively.  #2 intractable nausea with vomiting: Probably due to the DKA.  Hydrate and treat symptomatically.  #3 essential hypertension: Patient is on lisinopril.  Will hold due to AKI.  #4 hyperlipidemia: Continue with statin.  #5 chronic kidney disease stage III: Renal function elevated.  Continue monitor  #6 lactic acidosis: Most likely due to dehydration.  Aggressively hydrate and monitor lactic acid.  #7 leukocytosis: Most likely due to DKA.  Continue to monitor.    Advance Care Planning:   Code Status: DNR   Consults: None  Family Communication: No  family at bedside  Severity of Illness: The appropriate patient status for this patient is INPATIENT. Inpatient status is judged to be reasonable and necessary in order to provide the required intensity of service to ensure the patient's safety. The patient's presenting symptoms, physical exam findings, and initial radiographic and laboratory data in the context of their chronic comorbidities is felt to place them at high risk for further clinical deterioration. Furthermore, it is not anticipated that the patient will be medically stable for discharge from the hospital within 2 midnights of admission.   * I certify that at the point of admission it is my clinical judgment that the patient will require inpatient hospital care spanning beyond 2 midnights from the point of admission due to high intensity of service, high risk for further deterioration and high frequency of surveillance required.*  AuthorLonia Blood, MD 09/01/2022 8:47 PM  For on call review [email or web address].

## 2022-09-01 NOTE — ED Triage Notes (Signed)
PT states around 6am he started having nausea and vomiting and has just gotten worse. Pt states it started after he took his home medications. Pt is diaphoretic on arrival. EMS reports large amounts of emesis with them as well as it is dark for them. Pt endorses compliance with all meds and controlled BS.

## 2022-09-01 NOTE — ED Notes (Signed)
Critical PO2 less than 31.

## 2022-09-02 DIAGNOSIS — E1110 Type 2 diabetes mellitus with ketoacidosis without coma: Secondary | ICD-10-CM | POA: Diagnosis not present

## 2022-09-02 LAB — BASIC METABOLIC PANEL
Anion gap: 11 (ref 5–15)
Anion gap: 14 (ref 5–15)
BUN: 31 mg/dL — ABNORMAL HIGH (ref 6–20)
BUN: 28 mg/dL — ABNORMAL HIGH (ref 6–20)
CO2: 25 mmol/L (ref 22–32)
CO2: 24 mmol/L (ref 22–32)
Calcium: 8.6 mg/dL — ABNORMAL LOW (ref 8.9–10.3)
Calcium: 8.9 mg/dL (ref 8.9–10.3)
Chloride: 96 mmol/L — ABNORMAL LOW (ref 98–111)
Chloride: 96 mmol/L — ABNORMAL LOW (ref 98–111)
Creatinine, Ser: 2.04 mg/dL — ABNORMAL HIGH (ref 0.61–1.24)
Creatinine, Ser: 1.76 mg/dL — ABNORMAL HIGH (ref 0.61–1.24)
GFR, Estimated: 38 mL/min — ABNORMAL LOW (ref >60)
GFR, Estimated: 45 mL/min — ABNORMAL LOW (ref >60)
Glucose, Bld: 159 mg/dL — ABNORMAL HIGH (ref 70–99)
Glucose, Bld: 168 mg/dL — ABNORMAL HIGH (ref 70–99)
Potassium: 4.8 mmol/L (ref 3.5–5.1)
Potassium: 4 mmol/L (ref 3.5–5.1)
Sodium: 132 mmol/L — ABNORMAL LOW (ref 135–145)
Sodium: 134 mmol/L — ABNORMAL LOW (ref 135–145)

## 2022-09-02 LAB — BETA-HYDROXYBUTYRIC ACID
Beta-Hydroxybutyric Acid: 0.72 mmol/L — ABNORMAL HIGH (ref 0.05–0.27)
Beta-Hydroxybutyric Acid: 2.5 mmol/L — ABNORMAL HIGH (ref 0.05–0.27)

## 2022-09-02 LAB — CBG MONITORING, ED
Glucose-Capillary: 192 mg/dL — ABNORMAL HIGH (ref 70–99)
Glucose-Capillary: 183 mg/dL — ABNORMAL HIGH (ref 70–99)
Glucose-Capillary: 171 mg/dL — ABNORMAL HIGH (ref 70–99)
Glucose-Capillary: 154 mg/dL — ABNORMAL HIGH (ref 70–99)
Glucose-Capillary: 155 mg/dL — ABNORMAL HIGH (ref 70–99)
Glucose-Capillary: 196 mg/dL — ABNORMAL HIGH (ref 70–99)
Glucose-Capillary: 148 mg/dL — ABNORMAL HIGH (ref 70–99)

## 2022-09-02 LAB — CBC
HCT: 33.2 % — ABNORMAL LOW (ref 39.0–52.0)
Hemoglobin: 11.3 g/dL — ABNORMAL LOW (ref 13.0–17.0)
MCH: 32.3 pg (ref 26.0–34.0)
MCHC: 34 g/dL (ref 30.0–36.0)
MCV: 94.9 fL (ref 80.0–100.0)
Platelets: 275 10*3/uL (ref 150–400)
RBC: 3.5 MIL/uL — ABNORMAL LOW (ref 4.22–5.81)
RDW: 13.1 % (ref 11.5–15.5)
WBC: 17.3 10*3/uL — ABNORMAL HIGH (ref 4.0–10.5)
nRBC: 0 % (ref 0.0–0.2)

## 2022-09-02 LAB — LACTIC ACID, PLASMA: Lactic Acid, Venous: 1.8 mmol/L (ref 0.5–1.9)

## 2022-09-02 LAB — GLUCOSE, CAPILLARY
Glucose-Capillary: 197 mg/dL — ABNORMAL HIGH (ref 70–99)
Glucose-Capillary: 230 mg/dL — ABNORMAL HIGH (ref 70–99)

## 2022-09-02 MED ORDER — INSULIN ASPART 100 UNIT/ML IJ SOLN
0.0000 [IU] | Freq: Every day | INTRAMUSCULAR | Status: DC
  Administered 2022-09-02: 2 [IU] via SUBCUTANEOUS
  Filled 2022-09-02: qty 0.05

## 2022-09-02 MED ORDER — MELATONIN 3 MG PO TABS
3.0000 mg | ORAL_TABLET | Freq: Every day | ORAL | Status: DC
  Administered 2022-09-02 – 2022-09-04 (×3): 3 mg via ORAL
  Filled 2022-09-02 (×3): qty 1

## 2022-09-02 MED ORDER — DRONABINOL 2.5 MG PO CAPS
2.5000 mg | ORAL_CAPSULE | Freq: Once | ORAL | Status: AC | PRN
  Administered 2022-09-02: 2.5 mg via ORAL
  Filled 2022-09-02: qty 1

## 2022-09-02 MED ORDER — SODIUM CHLORIDE 0.9 % IV SOLN
12.5000 mg | Freq: Four times a day (QID) | INTRAVENOUS | Status: DC | PRN
  Administered 2022-09-02 – 2022-09-04 (×6): 12.5 mg via INTRAVENOUS
  Filled 2022-09-02 (×6): qty 12.5

## 2022-09-02 MED ORDER — METOCLOPRAMIDE HCL 5 MG/ML IJ SOLN
10.0000 mg | Freq: Three times a day (TID) | INTRAMUSCULAR | Status: DC | PRN
  Administered 2022-09-02 – 2022-09-03 (×3): 10 mg via INTRAVENOUS
  Filled 2022-09-02 (×3): qty 2

## 2022-09-02 MED ORDER — PROCHLORPERAZINE EDISYLATE 10 MG/2ML IJ SOLN
5.0000 mg | Freq: Once | INTRAMUSCULAR | Status: AC | PRN
  Administered 2022-09-02: 5 mg via INTRAVENOUS
  Filled 2022-09-02: qty 2

## 2022-09-02 MED ORDER — SODIUM CHLORIDE 0.9% FLUSH
3.0000 mL | Freq: Two times a day (BID) | INTRAVENOUS | Status: DC
  Administered 2022-09-02 – 2022-09-05 (×5): 3 mL via INTRAVENOUS

## 2022-09-02 MED ORDER — MELATONIN 3 MG PO TABS
3.0000 mg | ORAL_TABLET | Freq: Once | ORAL | Status: AC
  Administered 2022-09-02: 3 mg via ORAL
  Filled 2022-09-02: qty 1

## 2022-09-02 MED ORDER — INSULIN ASPART 100 UNIT/ML IJ SOLN
0.0000 [IU] | Freq: Three times a day (TID) | INTRAMUSCULAR | Status: DC
  Administered 2022-09-02: 3 [IU] via SUBCUTANEOUS
  Administered 2022-09-03: 8 [IU] via SUBCUTANEOUS
  Administered 2022-09-03: 3 [IU] via SUBCUTANEOUS
  Administered 2022-09-03: 5 [IU] via SUBCUTANEOUS
  Administered 2022-09-04: 3 [IU] via SUBCUTANEOUS
  Administered 2022-09-04: 2 [IU] via SUBCUTANEOUS
  Administered 2022-09-04: 3 [IU] via SUBCUTANEOUS
  Administered 2022-09-05: 8 [IU] via SUBCUTANEOUS
  Filled 2022-09-02: qty 0.15

## 2022-09-02 MED ORDER — SODIUM CHLORIDE 0.9% FLUSH: 3.0000 mL | INTRAVENOUS | Status: DC | PRN

## 2022-09-02 MED ORDER — SODIUM CHLORIDE 0.9 % IV SOLN: INTRAVENOUS | Status: DC

## 2022-09-02 MED ORDER — INSULIN GLARGINE-YFGN 100 UNIT/ML ~~LOC~~ SOLN
10.0000 [IU] | Freq: Every day | SUBCUTANEOUS | Status: DC
  Administered 2022-09-04 – 2022-09-05 (×2): 10 [IU] via SUBCUTANEOUS
  Filled 2022-09-02 (×3): qty 0.1

## 2022-09-02 MED ORDER — HYDRALAZINE HCL 20 MG/ML IJ SOLN
10.0000 mg | Freq: Four times a day (QID) | INTRAMUSCULAR | Status: DC | PRN
  Administered 2022-09-02 – 2022-09-04 (×3): 10 mg via INTRAVENOUS
  Filled 2022-09-02 (×3): qty 1

## 2022-09-02 MED ORDER — SODIUM CHLORIDE 0.9 % IV SOLN: 250.0000 mL | INTRAVENOUS | Status: DC | PRN

## 2022-09-02 MED ORDER — INSULIN GLARGINE-YFGN 100 UNIT/ML ~~LOC~~ SOLN
5.0000 [IU] | Freq: Once | SUBCUTANEOUS | Status: AC
  Administered 2022-09-02: 5 [IU] via SUBCUTANEOUS
  Filled 2022-09-02: qty 0.05

## 2022-09-02 NOTE — Plan of Care (Signed)
Problem: Education: Goal: Ability to describe self-care measures that may prevent or decrease complications (Diabetes Survival Skills Education) will improve 09/02/2022 1641 by Lajoyce Corners, LPN Outcome: Progressing 09/02/2022 1641 by Lajoyce Corners, LPN Outcome: Progressing Goal: Individualized Educational Video(s) 09/02/2022 1641 by Lajoyce Corners, LPN Outcome: Progressing 09/02/2022 1641 by Lajoyce Corners, LPN Outcome: Progressing   Problem: Coping: Goal: Ability to adjust to condition or change in health will improve 09/02/2022 1641 by Lajoyce Corners, LPN Outcome: Progressing 09/02/2022 1641 by Lajoyce Corners, LPN Outcome: Progressing   Problem: Fluid Volume: Goal: Ability to maintain a balanced intake and output will improve 09/02/2022 1641 by Lajoyce Corners, LPN Outcome: Progressing 09/02/2022 1641 by Lajoyce Corners, LPN Outcome: Progressing   Problem: Health Behavior/Discharge Planning: Goal: Ability to identify and utilize available resources and services will improve 09/02/2022 1641 by Lajoyce Corners, LPN Outcome: Progressing 09/02/2022 1641 by Lajoyce Corners, LPN Outcome: Progressing Goal: Ability to manage health-related needs will improve 09/02/2022 1641 by Lajoyce Corners, LPN Outcome: Progressing 09/02/2022 1641 by Lajoyce Corners, LPN Outcome: Progressing   Problem: Metabolic: Goal: Ability to maintain appropriate glucose levels will improve 09/02/2022 1641 by Lajoyce Corners, LPN Outcome: Progressing 09/02/2022 1641 by Lajoyce Corners, LPN Outcome: Progressing   Problem: Nutritional: Goal: Maintenance of adequate nutrition will improve 09/02/2022 1641 by Lajoyce Corners, LPN Outcome: Progressing 09/02/2022 1641 by Lajoyce Corners, LPN Outcome: Progressing Goal: Progress toward achieving an optimal weight will improve Outcome: Progressing   Problem: Skin Integrity: Goal: Risk for impaired skin integrity will decrease Outcome: Progressing   Problem: Tissue  Perfusion: Goal: Adequacy of tissue perfusion will improve Outcome: Progressing   Problem: Education: Goal: Ability to describe self-care measures that may prevent or decrease complications (Diabetes Survival Skills Education) will improve Outcome: Progressing Goal: Individualized Educational Video(s) Outcome: Progressing   Problem: Cardiac: Goal: Ability to maintain an adequate cardiac output will improve Outcome: Progressing   Problem: Health Behavior/Discharge Planning: Goal: Ability to identify and utilize available resources and services will improve Outcome: Progressing Goal: Ability to manage health-related needs will improve Outcome: Progressing   Problem: Fluid Volume: Goal: Ability to achieve a balanced intake and output will improve Outcome: Progressing   Problem: Metabolic: Goal: Ability to maintain appropriate glucose levels will improve Outcome: Progressing   Problem: Nutritional: Goal: Maintenance of adequate nutrition will improve Outcome: Progressing Goal: Maintenance of adequate weight for body size and type will improve Outcome: Progressing   Problem: Respiratory: Goal: Will regain and/or maintain adequate ventilation Outcome: Progressing   Problem: Urinary Elimination: Goal: Ability to achieve and maintain adequate renal perfusion and functioning will improve Outcome: Progressing   Problem: Education: Goal: Knowledge of General Education information will improve Description: Including pain rating scale, medication(s)/side effects and non-pharmacologic comfort measures Outcome: Progressing   Problem: Health Behavior/Discharge Planning: Goal: Ability to manage health-related needs will improve Outcome: Progressing   Problem: Clinical Measurements: Goal: Ability to maintain clinical measurements within normal limits will improve Outcome: Progressing Goal: Will remain free from infection Outcome: Progressing Goal: Diagnostic test results will  improve Outcome: Progressing Goal: Respiratory complications will improve Outcome: Progressing Goal: Cardiovascular complication will be avoided Outcome: Progressing   Problem: Activity: Goal: Risk for activity intolerance will decrease Outcome: Progressing   Problem: Nutrition: Goal: Adequate nutrition will be maintained Outcome: Progressing   Problem: Coping: Goal: Level of anxiety will decrease Outcome: Progressing   Problem: Elimination: Goal: Will not experience complications related to bowel  motility Outcome: Progressing Goal: Will not experience complications related to urinary retention Outcome: Progressing   Problem: Pain Managment: Goal: General experience of comfort will improve Outcome: Progressing   Problem: Safety: Goal: Ability to remain free from injury will improve Outcome: Progressing   Problem: Skin Integrity: Goal: Risk for impaired skin integrity will decrease Outcome: Progressing

## 2022-09-02 NOTE — ED Notes (Signed)
ED TO INPATIENT HANDOFF REPORT  Name/Age/Gender James Bradford 57 y.o. male  Code Status    Code Status Orders  (From admission, onward)           Start     Ordered   09/01/22 2048  Do not attempt resuscitation (DNR)  Continuous       Question Answer Comment  If patient has no pulse and is not breathing Do Not Attempt Resuscitation   If patient has a pulse and/or is breathing: Medical Treatment Goals MEDICAL INTERVENTIONS DESIRED: Use advanced airway interventions, mechanical ventilation or cardioversion in appropriate circumstances; Use medication/IV fluids as indicated; Provide comfort medications; Transfer to Progressive/Stepdown/ICU as indicated.   Consent: Discussion documented in EHR or advanced directives reviewed      09/01/22 2047           Code Status History     Date Active Date Inactive Code Status Order ID Comments User Context   06/17/2022 2052 06/18/2022 1420 DNR [id number]  Charlsie Quest, MD ED   03/19/2022 0513 03/22/2022 2112 Full Code [id number]  Luiz Iron, NP Inpatient   12/09/2021 1612 12/13/2021 1915 Full Code [id number]  Rolly Salter, MD ED   09/09/2021 1639 09/10/2021 2047 DNR [id number]  Teddy Spike, DO ED   01/12/2021 2130 01/16/2021 1623 Full Code [id number]  John Giovanni, MD ED   09/28/2020 0923 09/30/2020 0542 Full Code [id number]  Jonah Blue, MD ED   08/23/2020 2021 08/24/2020 1943 Full Code [id number]  Bobette Mo, MD ED   04/23/2019 1453 04/24/2019 2041 DNR [id number]  Marthenia Rolling, DO ED   04/23/2019 1356 04/23/2019 1453 Full Code [id number]  Marthenia Rolling, DO ED       Home/SNF/Other Home  Chief Complaint DKA (diabetic ketoacidosis) (HCC) [E11.10]  Level of Care/Admitting Diagnosis ED Disposition     ED Disposition  Admit   Condition  --   Comment  Hospital Area: [facility] [[id number]]  Level of Care: Telemetry [5]  Admit to tele based on following criteria: Complex arrhythmia  (Bradycardia/Tachycardia)  May admit patient to [facility] or [facility] if equivalent level of care is available:: Yes  Covid Evaluation: Asymptomatic - no recent exposure (last 10 days) testing not required  Diagnosis: DKA (diabetic ketoacidosis) [employer]) [182993]  Admitting Physician: Rometta Emery [[id number]]  Attending Physician: Lorin Glass [[id number]]  Certification:: I certify this patient will need inpatient services for at least 2 midnights  Estimated Length of Stay: 4          Medical History Past Medical History:  Diagnosis Date   Aortic atherosclerosis (HCC) 08/24/2020   Hyperlipidemia    Hypertension    Marijuana abuse 09/28/2020   Sinus bradycardia    Stage 3 chronic kidney disease (HCC)    Type 2 diabetes mellitus (HCC)    Unspecified glaucoma 02/18/2008   Qualifier: Diagnosis of  By: Daphine Deutscher FNP, [place]      Allergies Allergies  Allergen Reactions   Ibuprofen Other (See Comments) and Nausea And Vomiting    Stomach pain  Other Reaction(s) from [facility]: Abdominal pain, Nausea   Pork-Derived Products Other (See Comments)    religious   Jonne Ply [Aspirin] Other (See Comments)    Stomach pain    IV Location/Drains/Wounds Patient Lines/Drains/Airways Status     Active Line/Drains/Airways     Name Placement date Placement time Site Days   Peripheral IV 09/01/22 20 G 1" Left Antecubital 09/01/22  1911  Antecubital  1  Peripheral IV 09/01/22 20 G Right Antecubital 09/01/22  --  Antecubital  1            Labs/Imaging Results for orders placed or performed during the hospital encounter of 09/01/22 (from the past 48 hour(s))  CBG monitoring, ED     Status: Abnormal   Collection Time: 09/01/22  6:45 PM  Result Value Ref Range   Glucose-Capillary 326 (H) 70 - 99 mg/dL    Comment: Glucose reference range applies only to samples taken after fasting for at least 8 hours.  Comprehensive metabolic panel     Status: Abnormal   Collection Time: 09/01/22   7:00 PM  Result Value Ref Range   Sodium 138 135 - 145 mmol/L    Comment: ELECTROLYTES REPEATED TO VERIFY   Potassium 4.6 3.5 - 5.1 mmol/L    Comment: ELECTROLYTES REPEATED TO VERIFY   Chloride 93 (L) 98 - 111 mmol/L    Comment: ELECTROLYTES REPEATED TO VERIFY   CO2 16 (L) 22 - 32 mmol/L    Comment: ELECTROLYTES REPEATED TO VERIFY   Glucose, Bld 339 (H) 70 - 99 mg/dL    Comment: Glucose reference range applies only to samples taken after fasting for at least 8 hours.   BUN 38 (H) 6 - 20 mg/dL   Creatinine, Ser 1.61 (H) 0.61 - 1.24 mg/dL   Calcium 9.9 8.9 - 09.6 mg/dL    Comment: ELECTROLYTES REPEATED TO VERIFY   Total Protein 9.1 (H) 6.5 - 8.1 g/dL   Albumin 5.3 (H) 3.5 - 5.0 g/dL   AST 43 (H) 15 - 41 U/L   ALT 35 0 - 44 U/L   Alkaline Phosphatase 77 38 - 126 U/L   Total Bilirubin 1.3 (H) 0.3 - 1.2 mg/dL   GFR, Estimated 25 (L) >60 mL/min    Comment: (NOTE) Calculated using the CKD-EPI Creatinine Equation (2021)    Anion gap 29 (H) 5 - 15    Comment: ELECTROLYTES REPEATED TO VERIFY Performed at [facility], [address], [place], [place] [address]   Lipase, blood     Status: None   Collection Time: 09/01/22  7:00 PM  Result Value Ref Range   Lipase 39 11 - 51 U/L    Comment: Performed at [facility], [address] [place], [place] [address]  CBC with Differential     Status: Abnormal   Collection Time: 09/01/22  7:00 PM  Result Value Ref Range   WBC 13.0 (H) 4.0 - 10.5 K/uL   RBC 4.36 4.22 - 5.81 MIL/uL   Hemoglobin 13.8 13.0 - 17.0 g/dL   HCT 14.7 82.9 - 56.2 %   MCV 96.6 80.0 - 100.0 fL   MCH 31.7 26.0 - 34.0 pg   MCHC 32.8 30.0 - 36.0 g/dL   RDW 13.0 86.5 - 78.4 %   Platelets 368 150 - 400 K/uL   nRBC 0.0 0.0 - 0.2 %   Neutrophils Relative % 88 %   Neutro Abs 11.3 (H) 1.7 - 7.7 K/uL   Lymphocytes Relative 7 %   Lymphs Abs 0.9 0.7 - 4.0 K/uL   Monocytes Relative 5 %   Monocytes Absolute 0.7 0.1 - 1.0 K/uL    Eosinophils Relative 0 %   Eosinophils Absolute 0.0 0.0 - 0.5 K/uL   Basophils Relative 0 %   Basophils Absolute 0.0 0.0 - 0.1 K/uL   Immature Granulocytes 0 %   Abs Immature Granulocytes 0.05 0.00 - 0.07 K/uL  Comment: Performed at [facility], [address], [place], [place] [address]  Beta-hydroxybutyric acid     Status: Abnormal   Collection Time: 09/01/22  7:00 PM  Result Value Ref Range   Beta-Hydroxybutyric Acid 7.61 (H) 0.05 - 0.27 mmol/L    Comment: RESULT CONFIRMED BY MANUAL DILUTION Performed at [facility], [address] [place], [place] [address]   Blood gas, venous     Status: Abnormal   Collection Time: 09/01/22  7:17 PM  Result Value Ref Range   pH, Ven 7.31 7.25 - 7.43   pCO2, Ven 33 (L) 44 - 60 mmHg   pO2, Ven <31 (LL) 32 - 45 mmHg    Comment: CRITICAL RESULT CALLED TO, READ BACK BY AND VERIFIED WITH: RN A ELLWANGER AT 1932 09/01/22 CRUICKSHANK A    Bicarbonate 16.6 (L) 20.0 - 28.0 mmol/L   Acid-base deficit 8.6 (H) 0.0 - 2.0 mmol/L   O2 Saturation 30.9 %   Patient temperature 37.0     Comment: Performed at [facility], [address] [place], [place] [address]  I-Stat CG4 Lactic Acid     Status: Abnormal   Collection Time: 09/01/22  8:31 PM  Result Value Ref Range   Lactic Acid, Venous 4.7 (HH) 0.5 - 1.9 mmol/L   Comment NOTIFIED PHYSICIAN   CBG monitoring, ED     Status: Abnormal   Collection Time: 09/01/22  8:34 PM  Result Value Ref Range   Glucose-Capillary 310 (H) 70 - 99 mg/dL    Comment: Glucose reference range applies only to samples taken after fasting for at least 8 hours.  Basic metabolic panel     Status: Abnormal   Collection Time: 09/01/22  9:43 PM  Result Value Ref Range   Sodium 139 135 - 145 mmol/L   Potassium 4.9 3.5 - 5.1 mmol/L   Chloride 99 98 - 111 mmol/L   CO2 18 (L) 22 - 32 mmol/L   Glucose, Bld 209 (H) 70 - 99 mg/dL    Comment: Glucose reference range applies only  to samples taken after fasting for at least 8 hours.   BUN 35 (H) 6 - 20 mg/dL   Creatinine, Ser 6.96 (H) 0.61 - 1.24 mg/dL   Calcium 8.9 8.9 - 29.5 mg/dL   GFR, Estimated 27 (L) >60 mL/min    Comment: (NOTE) Calculated using the CKD-EPI Creatinine Equation (2021)    Anion gap 22 (H) 5 - 15    Comment: ELECTROLYTES REPEATED TO VERIFY Performed at [facility], [address], [place], [place] [address]   Urinalysis, Routine w reflex microscopic -Urine, Clean Catch     Status: Abnormal   Collection Time: 09/01/22  9:52 PM  Result Value Ref Range   Color, Urine STRAW (A) YELLOW   APPearance CLEAR CLEAR   Specific Gravity, Urine 1.010 1.005 - 1.030   pH 5.0 5.0 - 8.0   Glucose, UA >=500 (A) NEGATIVE mg/dL   Hgb urine dipstick MODERATE (A) NEGATIVE   Bilirubin Urine NEGATIVE NEGATIVE   Ketones, ur 80 (A) NEGATIVE mg/dL   Protein, ur 244 (A) NEGATIVE mg/dL   Nitrite NEGATIVE NEGATIVE   Leukocytes,Ua NEGATIVE NEGATIVE   RBC / HPF 0-5 0 - 5 RBC/hpf   WBC, UA 0-5 0 - 5 WBC/hpf   Bacteria, UA NONE SEEN NONE SEEN   Squamous Epithelial / HPF 0-5 0 - 5 /HPF   Mucus PRESENT    Hyaline Casts, UA  PRESENT     Comment: Performed at [facility], [address] [place], [place] [address]  CBG monitoring, ED     Status: Abnormal   Collection Time: 09/01/22  9:52 PM  Result Value Ref Range   Glucose-Capillary 215 (H) 70 - 99 mg/dL    Comment: Glucose reference range applies only to samples taken after fasting for at least 8 hours.   Comment 1 Notify RN    Comment 2 Document in Chart   CBG monitoring, ED     Status: Abnormal   Collection Time: 09/01/22 10:47 PM  Result Value Ref Range   Glucose-Capillary 103 (H) 70 - 99 mg/dL    Comment: Glucose reference range applies only to samples taken after fasting for at least 8 hours.   Comment 1 Notify RN    Comment 2 Document in Chart   I-Stat CG4 Lactic Acid     Status: Abnormal   Collection Time: 09/01/22  11:00 PM  Result Value Ref Range   Lactic Acid, Venous 5.8 (HH) 0.5 - 1.9 mmol/L   Comment NOTIFIED PHYSICIAN   CBG monitoring, ED     Status: Abnormal   Collection Time: 09/01/22 11:48 PM  Result Value Ref Range   Glucose-Capillary 118 (H) 70 - 99 mg/dL    Comment: Glucose reference range applies only to samples taken after fasting for at least 8 hours.   Comment 1 Notify RN    Comment 2 Document in Chart   Basic metabolic panel     Status: Abnormal   Collection Time: 09/01/22 11:50 PM  Result Value Ref Range   Sodium 137 135 - 145 mmol/L   Potassium 4.1 3.5 - 5.1 mmol/L   Chloride 100 98 - 111 mmol/L   CO2 19 (L) 22 - 32 mmol/L   Glucose, Bld 138 (H) 70 - 99 mg/dL    Comment: Glucose reference range applies only to samples taken after fasting for at least 8 hours.   BUN 31 (H) 6 - 20 mg/dL   Creatinine, Ser 2.84 (H) 0.61 - 1.24 mg/dL   Calcium 8.3 (L) 8.9 - 10.3 mg/dL   GFR, Estimated 37 (L) >60 mL/min    Comment: (NOTE) Calculated using the CKD-EPI Creatinine Equation (2021)    Anion gap 18 (H) 5 - 15    Comment: Performed at [facility], [address] [place], [place] [address]  Beta-hydroxybutyric acid     Status: Abnormal   Collection Time: 09/01/22 11:50 PM  Result Value Ref Range   Beta-Hydroxybutyric Acid 3.67 (H) 0.05 - 0.27 mmol/L    Comment: Performed at [facility], [address], [place], [place] [address]  CBG monitoring, ED     Status: Abnormal   Collection Time: 09/02/22  1:01 AM  Result Value Ref Range   Glucose-Capillary 192 (H) 70 - 99 mg/dL    Comment: Glucose reference range applies only to samples taken after fasting for at least 8 hours.   Comment 1 Notify RN    Comment 2 Document in Chart   CBG monitoring, ED     Status: Abnormal   Collection Time: 09/02/22  1:57 AM  Result Value Ref Range   Glucose-Capillary 183 (H) 70 - 99 mg/dL    Comment: Glucose reference range applies only to samples taken after  fasting for at least 8 hours.   Comment 1 Notify RN    Comment 2 Document in Chart   CBG monitoring, ED  Status: Abnormal   Collection Time: 09/02/22  3:02 AM  Result Value Ref Range   Glucose-Capillary 171 (H) 70 - 99 mg/dL    Comment: Glucose reference range applies only to samples taken after fasting for at least 8 hours.   Comment 1 Notify RN    Comment 2 Document in Chart   CBG monitoring, ED     Status: Abnormal   Collection Time: 09/02/22  4:12 AM  Result Value Ref Range   Glucose-Capillary 154 (H) 70 - 99 mg/dL    Comment: Glucose reference range applies only to samples taken after fasting for at least 8 hours.  Beta-hydroxybutyric acid     Status: Abnormal   Collection Time: 09/02/22  4:39 AM  Result Value Ref Range   Beta-Hydroxybutyric Acid 0.72 (H) 0.05 - 0.27 mmol/L    Comment: Performed at [facility], [address], [place], [place] [address]  Basic metabolic panel     Status: Abnormal   Collection Time: 09/02/22  4:39 AM  Result Value Ref Range   Sodium 132 (L) 135 - 145 mmol/L   Potassium 4.8 3.5 - 5.1 mmol/L    Comment: HEMOLYSIS AT THIS LEVEL MAY AFFECT RESULT   Chloride 96 (L) 98 - 111 mmol/L   CO2 25 22 - 32 mmol/L   Glucose, Bld 159 (H) 70 - 99 mg/dL    Comment: Glucose reference range applies only to samples taken after fasting for at least 8 hours.   BUN 31 (H) 6 - 20 mg/dL   Creatinine, Ser 4.03 (H) 0.61 - 1.24 mg/dL   Calcium 8.6 (L) 8.9 - 10.3 mg/dL   GFR, Estimated 38 (L) >60 mL/min    Comment: (NOTE) Calculated using the CKD-EPI Creatinine Equation (2021)    Anion gap 11 5 - 15    Comment: Performed at [facility], [address], [place], [place] [address]  CBG monitoring, ED     Status: Abnormal   Collection Time: 09/02/22  5:24 AM  Result Value Ref Range   Glucose-Capillary 155 (H) 70 - 99 mg/dL    Comment: Glucose reference range applies only to samples taken after fasting for at least 8 hours.   CBG monitoring, ED     Status: Abnormal   Collection Time: 09/02/22  7:49 AM  Result Value Ref Range   Glucose-Capillary 196 (H) 70 - 99 mg/dL    Comment: Glucose reference range applies only to samples taken after fasting for at least 8 hours.  Basic metabolic panel     Status: Abnormal   Collection Time: 09/02/22 11:28 AM  Result Value Ref Range   Sodium 134 (L) 135 - 145 mmol/L   Potassium 4.0 3.5 - 5.1 mmol/L   Chloride 96 (L) 98 - 111 mmol/L   CO2 24 22 - 32 mmol/L   Glucose, Bld 168 (H) 70 - 99 mg/dL    Comment: Glucose reference range applies only to samples taken after fasting for at least 8 hours.   BUN 28 (H) 6 - 20 mg/dL   Creatinine, Ser 9.56 (H) 0.61 - 1.24 mg/dL   Calcium 8.9 8.9 - 38.7 mg/dL   GFR, Estimated 45 (L) >60 mL/min    Comment: (NOTE) Calculated using the CKD-EPI Creatinine Equation (2021)    Anion gap 14 5 - 15    Comment: Performed at [facility], [address] [place], [place] [address]  CBC     Status: Abnormal  Collection Time: 09/02/22 11:28 AM  Result Value Ref Range   WBC 17.3 (H) 4.0 - 10.5 K/uL   RBC 3.50 (L) 4.22 - 5.81 MIL/uL   Hemoglobin 11.3 (L) 13.0 - 17.0 g/dL   HCT 16.1 (L) 09.6 - 04.5 %   MCV 94.9 80.0 - 100.0 fL   MCH 32.3 26.0 - 34.0 pg   MCHC 34.0 30.0 - 36.0 g/dL   RDW 40.9 81.1 - 91.4 %   Platelets 275 150 - 400 K/uL   nRBC 0.0 0.0 - 0.2 %    Comment: Performed at [facility], [address], [place], [place] [address]  Lactic acid, plasma     Status: None   Collection Time: 09/02/22 11:28 AM  Result Value Ref Range   Lactic Acid, Venous 1.8 0.5 - 1.9 mmol/L    Comment: Performed at [facility], [address], [place], [place] [address]  Beta-hydroxybutyric acid     Status: Abnormal   Collection Time: 09/02/22 11:28 AM  Result Value Ref Range   Beta-Hydroxybutyric Acid 2.50 (H) 0.05 - 0.27 mmol/L    Comment: Performed at [facility], [address] [place], [place] [address]  CBG monitoring, ED     Status: Abnormal   Collection Time: 09/02/22 11:39 AM  Result Value Ref Range   Glucose-Capillary 148 (H) 70 - 99 mg/dL    Comment: Glucose reference range applies only to samples taken after fasting for at least 8 hours.   DG Chest Portable 1 View  Result Date: 09/01/2022 CLINICAL DATA:  Nausea and vomiting. EXAM: PORTABLE CHEST 1 VIEW COMPARISON:  Jun 17, 2022 FINDINGS: The heart size and mediastinal contours are within normal limits. Both lungs are clear. The visualized skeletal structures are unremarkable. IMPRESSION: No active disease. Electronically Signed   By: Aram Candela M.D.   On: 09/01/2022 20:44    Pending Labs Unresulted Labs (From admission, onward)     Start     Ordered   09/03/22 0500  Basic metabolic panel  Daily,   R      09/02/22 0811   09/03/22 0500  CBC with Differential/Platelet  Daily,   R      09/02/22 0811   09/02/22 1030  Hemoglobin A1c  Add-on,   AD        09/02/22 1029   Signed and Held  Basic metabolic panel  (Diabetes Ketoacidosis (DKA))  STAT Now then every 4 hours ,   R      Signed and Held            Vitals/Pain Today's Vitals   09/02/22 1000 09/02/22 1059 09/02/22 1200 09/02/22 1300  BP: (!) 172/88  112/76 (!) 142/75  Pulse: 88  88 86  Resp: (!) 24  11 14   Temp:  98.4 F (36.9 C)    TempSrc:      SpO2: 100%  99% 99%  Weight:      Height:      PainSc:        Isolation Precautions No active isolations  Medications Medications  pravastatin (PRAVACHOL) tablet 10 mg (10 mg Oral Given 09/01/22 2353)  insulin regular, human (MYXREDLIN) 100 units/ 100 mL infusion (0 Units/hr Intravenous Stopped 09/02/22 0804)  dextrose 5 % in lactated ringers infusion (0 mLs Intravenous Stopped 09/02/22 0804)  dextrose 50 % solution 0-50 mL (has no administration in time range)  ondansetron (ZOFRAN) injection 4 mg (4 mg Intravenous Given 09/02/22 4696)  sodium chloride flush (NS) 0.9 %  injection 3 mL (3 mLs Intravenous Given 09/02/22 0901)  sodium chloride flush (NS) 0.9 % injection 3 mL (has no administration in time range)  0.9 %  sodium chloride infusion (has no administration in time range)  insulin aspart (novoLOG) injection 0-15 Units (3 Units Subcutaneous Given 09/02/22 0802)  insulin aspart (novoLOG) injection 0-5 Units (has no administration in time range)  metoCLOPramide (REGLAN) injection 10 mg (has no administration in time range)  insulin glargine-yfgn (SEMGLEE) injection 10 Units (has no administration in time range)  0.9 %  sodium chloride infusion ( Intravenous New Bag/Given 09/02/22 1130)  melatonin tablet 3 mg (has no administration in time range)  lactated ringers bolus 1,000 mL (0 mLs Intravenous Stopped 09/01/22 2136)  metoCLOPramide (REGLAN) injection 10 mg (10 mg Intravenous Given 09/01/22 1916)  pantoprazole (PROTONIX) injection 40 mg (40 mg Intravenous Given 09/01/22 1917)  HYDROmorphone (DILAUDID) injection 1 mg (1 mg Intravenous Given 09/01/22 2010)  lactated ringers bolus 1,000 mL (0 mLs Intravenous Stopped 09/01/22 2148)  potassium chloride 10 mEq in 100 mL IVPB (0 mEq Intravenous Stopped 09/01/22 2147)  lactated ringers bolus 1,360 mL (0 mLs Intravenous Stopped 09/02/22 0131)  potassium chloride 10 mEq in 100 mL IVPB (0 mEq Intravenous Stopped 09/02/22 0307)  diphenhydrAMINE (BENADRYL) capsule 25 mg (25 mg Oral Given 09/01/22 2254)  melatonin tablet 3 mg (3 mg Oral Given 09/02/22 0459)  insulin glargine-yfgn (SEMGLEE) injection 5 Units (5 Units Subcutaneous Given 09/02/22 0600)  prochlorperazine (COMPAZINE) injection 5 mg (5 mg Intravenous Given 09/02/22 0857)    Mobility walks

## 2022-09-02 NOTE — Progress Notes (Addendum)
PROGRESS NOTE  James Bradford  DOB: 05-Nov-1965  PCP: [facility], [facility] [id number]  DOA: 09/01/2022  LOS: 1 day  Hospital Day: 2  Brief narrative: James Bradford is a 57 y.o. male with PMH significant for DM2 with gastroparesis, frequent DKA, HTN, HLD, CKD, aortic atherosclerosis, sinus bradycardia, marijuana use, glaucoma  08/2017, patient presented to the ED with complaint of nausea, vomiting, abdominal pain that started earlier that morning and persisted throughout the day.  His symptoms felt typical to previous episodes of DKA and hence came to the ED.  In the ED, afebrile, hemodynamically stable Initial labs with blood glucose of 300 VBG with pH 7.31, pCO2 33, HCO3 16 Labs with beta-hydroxybutyrate to 7.61, creatinine elevated 2.89, lactic acid elevated to 4.7, anion gap of 29 Urinalysis showed clear straw-colored urine with moderate hemoglobin, 80 ketones, negative leukocytes and negative nitrite Chest x-ray unremarkable Patient was started on DKA protocol with IV insulin drip, IV fluid Admitted to [facility]  Overnight, patient had serial BMP monitoring and electrolyte replacement.   Subjective: Patient was seen and examined this morning.  Pleasant middle-aged African-American male.  Lying on bed.  Not in distress.  Complains of nausea and could not take his breakfast this morning. Chart reviewed Last lactic acid draw was last night and was 5.8 BMP from this morning showed sodium level improved to 132, anion gap closed at 11 and beta-hydroxybutyrate level much better at 0.72 Most recent blood glucose level from this morning was 196  Assessment and plan: DKA Diabetic gastroparesis Presented with intractable nausea vomiting abdomen pain, dehydration Labs as above suggestive of DKA Started on DKA protocol with IV insulin drip, IV fluid, serial BMP monitoring and electrolyte replacements Most recent blood work this morning showed close anion gap, marked  improvement in beta-hydroxybutyrate level. Patient was switched from insulin drip to subcutaneous insulin Started on regular consistency diet but patient is nauseated and did not eat much.  Start IV Reglan PRN for gastroparesis  Type 2 diabetes mellitus uncontrolled A1c 9.9 in February 2024, but he reports it was down to 7.7 on recheck few weeks ago. PTA meds-on glipizide 10 mg twice daily, metformin 500 mg twice daily.  He follows up with a endocrinologist as an outpatient.  He was supposed to be on [place] but he never started it.  He has a CBG monitoring device. Given Semglee 5 units this morning.  Continue to monitor glucose with sliding scale insulin. Plan for Semglee 10 units for tomorrow. Patient reports he has an appointment with endocrinologist on 8/1. Lab Results  Component Value Date   HGBA1C 9.9 (H) 03/19/2022   Recent Labs  Lab 09/02/22 0302 09/02/22 0412 09/02/22 0524 09/02/22 0749 09/02/22 1139  GLUCAP 171* 154* 155* 196* 148*   AKI on CKD 2 Acute metabolic acidosis Lactic acidosis Baseline creatinine 1.19 on 7/3 suggestive of CKD 2  Presented with creatinine elevated at 2.89, bicarb level low at 16 secondary to ketoacidosis as well as lactic acidosis Serial BMP and lactic acid level as below -showing improvement with IV hydration.  Recent Labs    06/18/22 0401 06/29/22 0747 08/01/22 1945 08/03/22 1245 08/06/22 1450 09/01/22 1900 09/01/22 2143 09/01/22 2350 09/02/22 0439 09/02/22 1128  BUN 29* 22* 18 24* 22* 38* 35* 31* 31* 28*  CREATININE 1.64* 1.29* 1.23 1.20 1.19 2.89* 2.72* 2.09* 2.04* 1.76*  CO2 20* 22 20* 22 21* 16* 18* 19* 25 24   Leukocytosis WBC count elevated likely because of DKA itself.  Lactic acid level elevated because of dehydration No other evidence of infection. Currently not on antibiotics. WBC count elevated to 17 today but no other evidence of infection.  Continue to monitor Recent Labs  Lab 09/01/22 1900 09/01/22 2031  09/01/22 2300 09/02/22 1128  WBC 13.0*  --   --  17.3*  LATICACIDVEN  --  4.7* 5.8* 1.8   Hypertension Lisinopril on hold due to AKI  Hyperlipidemia Statin  Marijuana use Likely contributing to precipitation of gastroparesis symptoms and DKA.   Counseled to quit.   Mobility: Encourage ambulation  Goals of care   Code Status: DNR     DVT prophylaxis:  SCDs Start: 09/01/22 2301   Antimicrobials: None Fluid: NS at 75 mill per hour Consultants: None Family Communication: None at bedside  Status: Inpatient Level of care:  Telemetry   Patient from: Home Anticipated d/c to: Pending clinical course Needs to continue in-hospital care:  Blood sugar being monitored.  If appetite and blood sugar improved, plan to discharge home tomorrow      Diet:  Diet Order             Diet Carb Modified Fluid consistency: Thin; Room service appropriate? Yes  Diet effective now                   Scheduled Meds:  insulin aspart  0-15 Units Subcutaneous TID WC   insulin aspart  0-5 Units Subcutaneous QHS   [START ON 09/03/2022] insulin glargine-yfgn  10 Units Subcutaneous Daily   melatonin  3 mg Oral QHS   pravastatin  10 mg Oral QHS   sodium chloride flush  3 mL Intravenous Q12H    PRN meds: sodium chloride, dextrose, metoCLOPramide (REGLAN) injection, ondansetron (ZOFRAN) IV, sodium chloride flush   Infusions:   sodium chloride     sodium chloride 75 mL/hr at 09/02/22 1130    Antimicrobials: Anti-infectives (From admission, onward)    None       Nutritional status:  Body mass index is 23.49 kg/m.          Objective: Vitals:   09/02/22 1300 09/02/22 1339  BP: (!) 142/75 (!) 142/75  Pulse: 86 (!) 102  Resp: 14 20  Temp:  99 F (37.2 C)  SpO2: 99% 100%    Intake/Output Summary (Last 24 hours) at 09/02/2022 1433 Last data filed at 09/02/2022 0307 Gross per 24 hour  Intake 3851.66 ml  Output --  Net 3851.66 ml   Filed Weights   09/01/22 1848   Weight: 68 kg   Weight change:  Body mass index is 23.49 kg/m.   Physical Exam: General exam: Pleasant middle-aged African-American male Skin: No rashes, lesions or ulcers. HEENT: Atraumatic, normocephalic, no obvious bleeding Lungs: Clear to auscultation bilaterally CVS: Regular rate and rhythm, no murmur GI/Abd soft, mild epigastric tenderness, nondistended, bowel sound present CNS: Alert, awake, oriented x 3 Psychiatry: Mood appropriate Extremities: No pedal edema, no calf tenderness  Data Review: I have personally reviewed the laboratory data and studies available.  F/u labs ordered Unresulted Labs (From admission, onward)     Start     Ordered   09/03/22 0500  Basic metabolic panel  Daily,   R      09/02/22 0811   09/03/22 0500  CBC with Differential/Platelet  Daily,   R      09/02/22 0811   09/02/22 1030  Hemoglobin A1c  Add-on,   AD        09/02/22 1029  Signed and Held  Basic metabolic panel  (Diabetes Ketoacidosis (DKA))  STAT Now then every 4 hours ,   R      Signed and Held            Total time spent in review of labs and imaging, patient evaluation, formulation of plan, documentation and communication with family: 65 minutes  Signed, Lorin Glass, MD [facility] 09/02/2022

## 2022-09-02 NOTE — Inpatient Diabetes Management (Addendum)
Inpatient Diabetes Program Recommendations  AACE/ADA: New Consensus Statement on Inpatient Glycemic Control (2015)  Target Ranges:  Prepandial:   less than 140 mg/dL      Peak postprandial:   less than 180 mg/dL (1-2 hours)      Critically ill patients:  140 - 180 mg/dL    Latest Reference Range & Units 03/19/22 03:05  Hemoglobin A1C 4.8 - 5.6 % 9.9 (H)  237 mg/dl  (H): Data is abnormally high  Latest Reference Range & Units 09/01/22 19:00 09/01/22 23:50 09/02/22 04:39  Beta-Hydroxybutyric Acid 0.05 - 0.27 mmol/L 7.61 (H) 3.67 (H) 0.72 (H)  (H): Data is abnormally high  Latest Reference Range & Units 09/01/22 19:00  Sodium 135 - 145 mmol/L 138  Potassium 3.5 - 5.1 mmol/L 4.6  Chloride 98 - 111 mmol/L 93 (L)  CO2 22 - 32 mmol/L 16 (L)  Glucose 70 - 99 mg/dL 846 (H)  BUN 6 - 20 mg/dL 38 (H)  Creatinine 9.62 - 1.24 mg/dL 9.52 (H)  Calcium 8.9 - 10.3 mg/dL 9.9  Anion gap 5 - 15  29 (H)    Latest Reference Range & Units 09/01/22 18:45 09/01/22 20:34 09/01/22 21:52 09/01/22 22:47 09/01/22 23:48 09/02/22 01:01 09/02/22 01:57 09/02/22 03:02 09/02/22 04:12  Glucose-Capillary 70 - 99 mg/dL 841 (H) 324 (H)  IV Insulin Drip Started 215 (H) 103 (H) 118 (H) 192 (H) 183 (H) 171 (H) 154 (H)  5 units Semglee @0600   (H): Data is abnormally high  Latest Reference Range & Units 09/02/22 05:24 09/02/22 07:49  Glucose-Capillary 70 - 99 mg/dL 401 (H) 027 (H)  3 units Novolog   (H): Data is abnormally high   Admit with: DKA  History: DM2, Frequent DKA, CKD, Gastroparesis  Home DM Meds: Glipizide 10 mg BID        Tresiba 10 units Daily (NOT taking)        Metformin 1000 mg BID  Current Orders: IV Insulin Drip   Novolog Moderate Correction Scale/ SSI (0-15 units) TID AC + HS    3rd admission since Feb 2024 6 ED visits since April 2024 Counseled by the Diabetes Coordinator in Nov 2023 Counseled by the Diabetes Coordinator in Feb 2024 Per pharmacy benefits check in Feb 2024, Evaristo Bury  Insulin and Novolog insulin are both $15 co-pay   MD- Please start Semglee 10 units Daily (start 07/31 as pt got Semglee 5 units this AM)    Addendum 12:30pm--Met w/ pt down in the ED.  Pt told me he takes Glipizide 10 mg BID, Metformin 500 mg BID at home.  Picked up his Rx for Tresiba insulin, but Never started taking it.  Pt told me he thinks the Insulin combined with the oral meds is too much medicine.  Pt went on to tell me that his CBGs look good when he takes his oral meds and he doesn't feel like he needs the insulin.  I asked pt if he talked w/ his PCP about this and he stated he did tell his PCP he was not taking the [place] insulin.  Pt further told me he did NOT take his oral meds for 3 days this past weekend b/c he had no appetite--started the oral meds back Monday.  I had a frank conversation with pt that he obviously needs meds for CBG control and that he likely needs insulin as well, b/c if he made enough of his own endogenous insulin then he would not have gone into DKA.  Explained what  DKA is and explained all the DKA treatments given.  Discussed with pt that we have given him 5 units basal insulin today and that we will monitor his CBGs closely to determine how much insulin he will need for home.  Explained to pt that he likely needs the insulin to prevent DKA.  Pt told me he has an appt with his PCP at [facility] on Thursday, 08/01.  Strongly encouraged pt to keep this appt and discuss insulin for home with his PCP.    --Will follow patient during hospitalization--  Ambrose Finland RN, MSN, CDCES Diabetes Coordinator Inpatient Glycemic Control Team Team Pager: [phone] (8a-5p)

## 2022-09-02 NOTE — ED Notes (Signed)
Pt stated that after a few bites of his breakfast, he got nauseated and had an episode of emesis. See MAR. Pt had no other complaints at this time and was resting comfortably. Call bell within reach, VSS, bed in lowest, locked position. No needs identified at this time.

## 2022-09-03 ENCOUNTER — Inpatient Hospital Stay (HOSPITAL_COMMUNITY): Payer: BLUE CROSS/BLUE SHIELD

## 2022-09-03 DIAGNOSIS — E1110 Type 2 diabetes mellitus with ketoacidosis without coma: Secondary | ICD-10-CM | POA: Diagnosis not present

## 2022-09-03 LAB — GLUCOSE, CAPILLARY
Glucose-Capillary: 236 mg/dL — ABNORMAL HIGH (ref 70–99)
Glucose-Capillary: 221 mg/dL — ABNORMAL HIGH (ref 70–99)
Glucose-Capillary: 168 mg/dL — ABNORMAL HIGH (ref 70–99)
Glucose-Capillary: 270 mg/dL — ABNORMAL HIGH (ref 70–99)
Glucose-Capillary: 40 mg/dL — CL (ref 70–99)
Glucose-Capillary: 60 mg/dL — ABNORMAL LOW (ref 70–99)
Glucose-Capillary: 92 mg/dL (ref 70–99)

## 2022-09-03 LAB — LIPASE, BLOOD: Lipase: 41 U/L (ref 11–51)

## 2022-09-03 LAB — PHOSPHORUS: Phosphorus: 2.8 mg/dL (ref 2.5–4.6)

## 2022-09-03 MED ORDER — DRONABINOL 2.5 MG PO CAPS
2.5000 mg | ORAL_CAPSULE | Freq: Once | ORAL | Status: AC | PRN
  Administered 2022-09-03: 2.5 mg via ORAL
  Filled 2022-09-03: qty 1

## 2022-09-03 MED ORDER — DRONABINOL 2.5 MG PO CAPS: 2.5000 mg | ORAL_CAPSULE | Freq: Once | ORAL | Status: DC | PRN

## 2022-09-03 MED ORDER — METOCLOPRAMIDE HCL 5 MG/ML IJ SOLN
10.0000 mg | Freq: Three times a day (TID) | INTRAMUSCULAR | Status: DC
  Administered 2022-09-03 – 2022-09-05 (×7): 10 mg via INTRAVENOUS
  Filled 2022-09-03 (×7): qty 2

## 2022-09-03 MED ORDER — ALUM & MAG HYDROXIDE-SIMETH 200-200-20 MG/5ML PO SUSP
30.0000 mL | ORAL | Status: DC | PRN
  Administered 2022-09-03 – 2022-09-04 (×4): 30 mL via ORAL
  Filled 2022-09-03 (×4): qty 30

## 2022-09-03 MED ORDER — MORPHINE SULFATE (PF) 2 MG/ML IV SOLN
2.0000 mg | INTRAVENOUS | Status: DC | PRN
  Administered 2022-09-03 – 2022-09-04 (×7): 2 mg via INTRAVENOUS
  Filled 2022-09-03 (×7): qty 1

## 2022-09-03 NOTE — Plan of Care (Signed)

## 2022-09-03 NOTE — Progress Notes (Signed)
Transition of Care [facility]) - Inpatient Brief Assessment   Patient Details  Name: James Bradford MRN: [id number] Date of Birth: 1965/11/13  Transition of Care [facility]) CM/SW Contact:    Durenda Guthrie, RN Phone Number: 09/03/2022, 9:13 AM   Clinical Narrative:    Transition of Care Asessment: Insurance and Status: (P) Insurance coverage has been reviewed Patient has primary care physician: (P) Yes Home environment has been reviewed: (P) lives with sister Prior level of function:: (P) independent Prior/Current Home Services: (P) No current home services Social Determinants of Health Reivew: (P) SDOH reviewed no interventions necessary Readmission risk has been reviewed: (P) Yes Transition of care needs: (P) no transition of care needs at this time

## 2022-09-03 NOTE — Progress Notes (Signed)
Persistent nausea/dry heaves throughout shift min to no relief after multiple antiemetics. Patient requesting to speak with on call.  NP notified, will attempt to assess pt

## 2022-09-03 NOTE — Progress Notes (Signed)
PROGRESS NOTE  James Bradford  DOB: [birth year]  PCP: [facility] [id number]  DOA: 09/01/2022  LOS: 2 days  Hospital Day: 3  Brief narrative: James Bradford is a 57 y.o. male with PMH significant for DM2 with gastroparesis, frequent DKA, HTN, HLD, CKD, aortic atherosclerosis, sinus bradycardia, marijuana use, glaucoma  08/2017, patient presented to the ED with complaint of nausea, vomiting, abdominal pain that started earlier that morning and persisted throughout the day.  His symptoms felt typical to previous episodes of DKA and hence came to the ED.  In the ED, afebrile, hemodynamically stable Initial labs with blood glucose of 300 VBG with pH 7.31, pCO2 33, HCO3 16 Labs with beta-hydroxybutyrate to 7.61, creatinine elevated 2.89, lactic acid elevated to 4.7, anion gap of 29 Urinalysis showed clear straw-colored urine with moderate hemoglobin, 80 ketones, negative leukocytes and negative nitrite Chest x-ray unremarkable Patient was started on DKA protocol with IV insulin drip, IV fluid Admitted to [facility]  Overnight, patient had serial BMP monitoring and electrolyte replacement.   Subjective: Patient was seen and examined this morning.   Not feeling good today.  Since yesterday afternoon, he had recurrent nausea and multiple episodes of vomiting.  Could not hold food down.  He has been tried on multiple antiemetics with no significant improvement.  Also complains of epigastric abdominal pain   Assessment and plan: DKA Diabetic gastroparesis Presented with intractable nausea vomiting abdomen pain, dehydration Labs as above suggestive of DKA Started on DKA protocol with IV insulin drip, IV fluid serial BMP monitoring showed closed anion gap, marked improvement in beta-hydroxybutyrate level. Patient was switched from insulin drip to subcutaneous insulin  Intractable nausea and vomiting Although DKA labs improved, patient continues to have nausea and  vomiting.  He has not been able to tolerate oral diet.  Currently on as needed IV Zofran, IV Reglan, IV Compazine as well as IV Marinol..  I have scheduled IV Reglan for next 2 days. Continue IV hydration. Switched back to clear liquid diet.  Labs do not suggest bounce back to DKA but need to monitor. Low likelihood but rule out bowel obstruction with abdominal imaging.  Type 2 diabetes mellitus uncontrolled A1c 7.8 on 09/02/2022 PTA meds-on glipizide 10 mg twice daily, metformin 500 mg twice daily.  He follows up with a endocrinologist as an outpatient.  He was supposed to be on [place] but he never started it.  He has a CBG monitoring device. Currently on Semglee 10 units daily and SSI. Patient reports he has an appointment with endocrinologist on 8/1 which he is going to miss because of hospitalization Recent Labs  Lab 09/02/22 1139 09/02/22 1733 09/02/22 2149 09/03/22 0559 09/03/22 0730  GLUCAP 148* 197* 230* 236* 221*   AKI on CKD 2 Acute metabolic acidosis Lactic acidosis Baseline creatinine 1.19 on 7/3 suggestive of CKD 2  Presented with creatinine elevated at 2.89, bicarb level low at 16 secondary to ketoacidosis as well as lactic acidosis Serial BMP and lactic acid level as below -showing improvement with IV hydration. Creatinine down at 1.48 this morning.  Continue to monitor.  Recent Labs    06/29/22 0747 08/01/22 1945 08/03/22 1245 08/06/22 1450 09/01/22 1900 09/01/22 2143 09/01/22 2350 09/02/22 0439 09/02/22 1128 09/03/22 0544  BUN 22* 18 24* 22* 38* 35* 31* 31* 28* 19  CREATININE 1.29* 1.23 1.20 1.19 2.89* 2.72* 2.09* 2.04* 1.76* 1.48*  CO2 22 20* 22 21* 16* 18* 19* 25 24 22    Leukocytosis WBC count  elevated likely because of DKA itself.   Lactic acid level elevated because of dehydration No other evidence of infection. Currently not on antibiotics. Continue to monitor Recent Labs  Lab 09/01/22 1900 09/01/22 2031 09/01/22 2300 09/02/22 1128  09/03/22 0544  WBC 13.0*  --   --  17.3* 13.3*  LATICACIDVEN  --  4.7* 5.8* 1.8  --    Hypertension Lisinopril on hold due to AKI Blood pressure elevated to 170s this morning probably because of the stress of nausea and vomiting.  For now I will continue IV hydralazine as needed  Hyperlipidemia Statin  Marijuana use Likely contributing to precipitation of gastroparesis symptoms and DKA.   Counseled to quit.   Mobility: Encourage ambulation  Goals of care   Code Status: DNR     DVT prophylaxis:  SCDs Start: 09/01/22 2301   Antimicrobials: None Fluid: NS at 75 mill per hour to continue Consultants: None Family Communication: None at bedside  Status: Inpatient Level of care:  Telemetry   Patient from: Home Anticipated d/c to: Pending clinical course Needs to continue in-hospital care:  Unable to discharge today because of intractable nausea and vomiting and high risk of bounce back to DKA.    Diet:  Diet Order             Diet Carb Modified Fluid consistency: Thin; Room service appropriate? Yes  Diet effective now                   Scheduled Meds:  insulin aspart  0-15 Units Subcutaneous TID WC   insulin aspart  0-5 Units Subcutaneous QHS   insulin glargine-yfgn  10 Units Subcutaneous Daily   melatonin  3 mg Oral QHS   metoCLOPramide (REGLAN) injection  10 mg Intravenous Q8H   pravastatin  10 mg Oral QHS   sodium chloride flush  3 mL Intravenous Q12H    PRN meds: sodium chloride, dextrose, dronabinol, hydrALAZINE, morphine injection, ondansetron (ZOFRAN) IV, promethazine (PHENERGAN) injection (IM or IVPB), sodium chloride flush   Infusions:   sodium chloride     sodium chloride 75 mL/hr at 09/02/22 1449   promethazine (PHENERGAN) injection (IM or IVPB) 12.5 mg (09/03/22 0817)    Antimicrobials: Anti-infectives (From admission, onward)    None       Nutritional status:  Body mass index is 23.49 kg/m.          Objective: Vitals:    09/03/22 0324 09/03/22 0515  BP: 127/79 (!) 171/94  Pulse: (!) 108 100  Resp: 16 19  Temp: 99.1 F (37.3 C) 99.4 F (37.4 C)  SpO2: 100% 100%    Intake/Output Summary (Last 24 hours) at 09/03/2022 1032 Last data filed at 09/03/2022 0600 Gross per 24 hour  Intake 900 ml  Output --  Net 900 ml   Filed Weights   09/01/22 1848  Weight: 68 kg   Weight change:  Body mass index is 23.49 kg/m.   Physical Exam: General exam: Pleasant middle-aged African-American male Skin: No rashes, lesions or ulcers. HEENT: Atraumatic, normocephalic, no obvious bleeding Lungs: Clear to auscultation bilaterally CVS: Regular rate and rhythm, no murmur GI/Abd soft, mild epigastric tenderness, nondistended, bowel sound present CNS: Alert, awake, oriented x 3 Psychiatry: Frustrated because of nausea and vomiting Extremities: No pedal edema, no calf tenderness  Data Review: I have personally reviewed the laboratory data and studies available.  F/u labs ordered [employer] (From admission, onward)     Start     Ordered  09/03/22 0500  Basic metabolic panel  Daily,   R      09/02/22 0811   09/03/22 0500  CBC with Differential/Platelet  Daily,   R      09/02/22 0811   Signed and Held  Basic metabolic panel  (Diabetes Ketoacidosis (DKA))  STAT Now then every 4 hours ,   R      Signed and Held            Total time spent in review of labs and imaging, patient evaluation, formulation of plan, documentation and communication with family: 55 minutes  Signed, Lorin Glass, MD [facility] 09/03/2022

## 2022-09-04 DIAGNOSIS — E1110 Type 2 diabetes mellitus with ketoacidosis without coma: Secondary | ICD-10-CM | POA: Diagnosis not present

## 2022-09-04 LAB — BLOOD GAS, VENOUS
Acid-Base Excess: 0.7 mmol/L (ref 0.0–2.0)
Bicarbonate: 25.4 mmol/L (ref 20.0–28.0)
O2 Saturation: 49.6 %
Patient temperature: 37
pCO2, Ven: 40 mmHg — ABNORMAL LOW (ref 44–60)
pH, Ven: 7.41 (ref 7.25–7.43)
pO2, Ven: <31 mmHg — CL (ref 32–45)

## 2022-09-04 LAB — BASIC METABOLIC PANEL
Anion gap: 17 — ABNORMAL HIGH (ref 5–15)
Anion gap: 15 (ref 5–15)
BUN: 18 mg/dL (ref 6–20)
BUN: 16 mg/dL (ref 6–20)
CO2: 20 mmol/L — ABNORMAL LOW (ref 22–32)
CO2: 23 mmol/L (ref 22–32)
Calcium: 8.6 mg/dL — ABNORMAL LOW (ref 8.9–10.3)
Calcium: 8.7 mg/dL — ABNORMAL LOW (ref 8.9–10.3)
Chloride: 96 mmol/L — ABNORMAL LOW (ref 98–111)
Chloride: 96 mmol/L — ABNORMAL LOW (ref 98–111)
Creatinine, Ser: 1.52 mg/dL — ABNORMAL HIGH (ref 0.61–1.24)
Creatinine, Ser: 1.38 mg/dL — ABNORMAL HIGH (ref 0.61–1.24)
GFR, Estimated: 54 mL/min — ABNORMAL LOW (ref >60)
GFR, Estimated: >60 mL/min (ref >60)
Glucose, Bld: 198 mg/dL — ABNORMAL HIGH (ref 70–99)
Glucose, Bld: 221 mg/dL — ABNORMAL HIGH (ref 70–99)
Potassium: 4.1 mmol/L (ref 3.5–5.1)
Potassium: 3.7 mmol/L (ref 3.5–5.1)
Sodium: 133 mmol/L — ABNORMAL LOW (ref 135–145)
Sodium: 134 mmol/L — ABNORMAL LOW (ref 135–145)

## 2022-09-04 LAB — BASIC METABOLIC PANEL WITH GFR
Anion gap: 14 (ref 5–15)
BUN: 14 mg/dL (ref 6–20)
CO2: 20 mmol/L — ABNORMAL LOW (ref 22–32)
Calcium: 7.8 mg/dL — ABNORMAL LOW (ref 8.9–10.3)
Chloride: 100 mmol/L (ref 98–111)
Creatinine, Ser: 1.26 mg/dL — ABNORMAL HIGH (ref 0.61–1.24)
GFR, Estimated: >60 mL/min
Glucose, Bld: 125 mg/dL — ABNORMAL HIGH (ref 70–99)
Potassium: 3.6 mmol/L (ref 3.5–5.1)
Sodium: 134 mmol/L — ABNORMAL LOW (ref 135–145)

## 2022-09-04 LAB — GLUCOSE, CAPILLARY
Glucose-Capillary: 152 mg/dL — ABNORMAL HIGH (ref 70–99)
Glucose-Capillary: 187 mg/dL — ABNORMAL HIGH (ref 70–99)
Glucose-Capillary: 176 mg/dL — ABNORMAL HIGH (ref 70–99)
Glucose-Capillary: 192 mg/dL — ABNORMAL HIGH (ref 70–99)
Glucose-Capillary: 132 mg/dL — ABNORMAL HIGH (ref 70–99)
Glucose-Capillary: 239 mg/dL — ABNORMAL HIGH (ref 70–99)
Glucose-Capillary: 196 mg/dL — ABNORMAL HIGH (ref 70–99)
Glucose-Capillary: 169 mg/dL — ABNORMAL HIGH (ref 70–99)
Glucose-Capillary: 119 mg/dL — ABNORMAL HIGH (ref 70–99)

## 2022-09-04 LAB — BETA-HYDROXYBUTYRIC ACID: Beta-Hydroxybutyric Acid: 3.02 mmol/L — ABNORMAL HIGH (ref 0.05–0.27)

## 2022-09-04 MED ORDER — MORPHINE SULFATE (PF) 2 MG/ML IV SOLN
2.0000 mg | INTRAVENOUS | Status: DC | PRN
  Administered 2022-09-04 – 2022-09-05 (×3): 2 mg via INTRAVENOUS
  Filled 2022-09-04 (×3): qty 1

## 2022-09-04 MED ORDER — SODIUM CHLORIDE 0.9 % IV BOLUS
1000.0000 mL | Freq: Once | INTRAVENOUS | Status: AC
  Administered 2022-09-04: 1000 mL via INTRAVENOUS

## 2022-09-04 MED ORDER — OXYCODONE HCL 5 MG PO TABS
5.0000 mg | ORAL_TABLET | Freq: Four times a day (QID) | ORAL | Status: DC | PRN
  Administered 2022-09-04: 5 mg via ORAL
  Filled 2022-09-04: qty 1

## 2022-09-04 NOTE — Plan of Care (Signed)

## 2022-09-04 NOTE — Plan of Care (Signed)
  Problem: Skin Integrity: Goal: Risk for impaired skin integrity will decrease Outcome: Progressing   Problem: Respiratory: Goal: Will regain and/or maintain adequate ventilation Outcome: Progressing   Problem: Urinary Elimination: Goal: Ability to achieve and maintain adequate renal perfusion and functioning will improve Outcome: Progressing   Problem: Health Behavior/Discharge Planning: Goal: Ability to manage health-related needs will improve Outcome: Progressing   Problem: Nutritional: Goal: Maintenance of adequate nutrition will improve Outcome: Not Progressing

## 2022-09-04 NOTE — Progress Notes (Signed)
Received critical lab of venous 02 less than 31. Text paged NP Anthoney Harada NP. Also text paged about his elevated b/p (177/105) and heart rate (140s)Morphine and zofran given prn and heart rate has come back down to 120s and verbalizes 5/10 from 10/10. Patient wanted his blood sugar taken, 119 at this time. Reassured patient of blood sugar check

## 2022-09-04 NOTE — Progress Notes (Signed)
I was asked to see patient for chest discomfort and tachycardia, not feeling well.  Telemetry monitor showed sinus tachycardia with heart rate of 160. Patient seen and examined.  He was telling me he is just not feeling well.  By the time of my arrival, he was denying any chest pain or palpitations.  His blood sugars was 169.  He has not eaten anything today.  Patient seen and examined along with the rapid response and nursing staff.  Patient currently hemodynamically stable.  Heart rate 110.  Blood pressure is adequate.  Blood sugars are adequate.  He is on room air. EKG shows sinus tachycardia, no significant changes.  He is on room air. BMP was repeated, blood sugar 220 with anion gap of 15.  Currently no evidence of DKA.  Plan: Chest pain, likely due to vomiting now resolved.  Keep on clears and sips of water. Tachycardia, likely secondary to dehydration, hydralazine and nausea.  Improved. Hyperglycemia, no evidence of DKA.  Already received subcutaneous insulin.  Additional 2 L of IV fluid bolus, maintain subcu insulin now. Patient can stay at the current unit. Reexamined patient, feels much better but worried about not able to eat.

## 2022-09-04 NOTE — Progress Notes (Signed)
PROGRESS NOTE James Bradford  XBJ:[id number] DOB: December 20, 1965 DOA: 09/01/2022 PCP: [facility], [facility]  Brief Narrative/Hospital Course: 57 y.o. male with PMH significant for DM2 with gastroparesis, frequent DKA, HTN, HLD, CKD, aortic atherosclerosis, sinus bradycardia, marijuana use, glaucoma presented to the ED with nausea, vomiting, abdominal pain  just like his previous episodes of DKA  In the ED, afebrile, hemodynamically stable Initial labs with blood glucose of 300, VBG with pH 7.31, pCO2 33, HCO3 16, Labs with beta-hydroxybutyrate to 7.61, creatinine elevated 2.89, lactic acid elevated to 4.7, anion gap of 29 Urinalysis showed clear straw-colored urine with moderate hemoglobin, 80 ketones, negative leukocytes and negative nitrite Chest x-ray unremarkable Patient was admitted for DKA with insulin drip IV fluids DKA has resolved and transition to subcu insulin Patient continued to endorse intractable nausea vomiting with diabetic gastroparesis and having difficulty tolerating diet. X-ray abdomen 7/31 done to rule out bowel obstruction - had normal bowel gas pattern    Subjective: Patient seen and examined this morning. Has not eaten breakfast did not eat dinner, will try to eat lunch today Has not had a bowel movement, passing flatus Past 24 hours vitals fairly stable although blood pressure uptrending Labs reviewed blood sugar as low as 40-60 yesterday around 9 PM> since then trending up 190s this am creatinine downtrending at 1.4 bicarb 20, anion gap 17-trending up has been getting sliding scale insulin, n leukocytosis resolving, has been getting Phenergan IV with last dose at 230 this morning and Zofran at 628 this morning along with as needed hydralazine, morphine and Maalox  Assessment and Plan: Principal Problem:   DKA (diabetic ketoacidosis) (HCC) Active Problems:   Nausea and vomiting   Lactic acidosis   Hypertension associated with diabetes (HCC)   Acute  renal failure superimposed on stage 3a chronic kidney disease (HCC)   Hyperlipidemia associated with type 2 diabetes mellitus (HCC)  DKA Intractable nausea vomiting abdomen pain-diabetic gastroparesis: Presented with intractable nausea vomiting abdominal pain and dehydration. Labs suggestive of DKA managed with DKA protocol IV fluids insulin drip.  Anion gap had closed bicarb ,bhob improved, and transition to subcu insulin regimen.  Patient had hypoglycemia episode, has not taken long-acting insulin but doing sliding scale.  Not tolerating diet well currently on clear liquid diet encourage oral intake continue IV fluids -antiemetics with IV Zofran, IV Reglan, IV Compazine as well as IV Marinol. monitor BMP serially.X-ray abdomen 7/31 done to rule out bowel obstruction - had normal bowel gas pattern   Type 2 diabetes mellitus with uncontrolled hyperglycemia Episode of hypoglycemia: A1c 7.8, continue Semglee 10 units, SSI and monitor.  PTA on metformin and glipizide at home. Recent Labs  Lab 09/02/22 1128 09/02/22 1139 09/04/22 0105 09/04/22 0159 09/04/22 0546 09/04/22 0722 09/04/22 1128  GLUCAP  --    < > 152* 187* 176* 192* 132*  HGBA1C 7.8*  --   --   --   --   --   --    < > = values in this interval not displayed.    AKI on CKD 2 Acute metabolic acidosis Lactic acidosis Baseline creatinine 1.19 on 7/3 suggestive of CKD 2  On admission creatinine at 2.8 now nicely improving continue gentle IV fluid hydration, bicarb is holding at 20 anion gap 17 will check serially  Recent Labs    08/03/22 1245 08/06/22 1450 09/01/22 1900 09/01/22 2143 09/01/22 2350 09/02/22 0439 09/02/22 1128 09/03/22 0544 09/04/22 0508 09/04/22 0840  BUN 24* 22* 38* 35* 31* 31*  28* 19 18 18   CREATININE 1.20 1.19 2.89* 2.72* 2.09* 2.04* 1.76* 1.48* 1.49* 1.52*  CO2 22 21* 16* 18* 19* 25 24 22  20* 20*   Leukocytosis Likely reactive in the setting of DKA downtrending, afebrile lactic acidosis resolved  which likely reflects dehydration and DKA status  Recent Labs  Lab 09/01/22 1900 09/01/22 2031 09/01/22 2300 09/02/22 1128 09/03/22 0544 09/04/22 0508  WBC 13.0*  --   --  17.3* 13.3* 10.8*  LATICACIDVEN  --  4.7* 5.8* 1.8  --   --    Hypertension BP is fairly controlled. lisinopril on hold due to AKI Monitor and resume meds as renal function improves add hydralazine as needed  Hyperlipidemia Cont statin  Marijuana use Likely contributing to precipitation of gastroparesis symptoms and DKA.Counseled to quit.  DVT prophylaxis: SCDs Start: 09/01/22 2301 Code Status:   Code Status: DNR Family Communication: plan of care discussed with patient at bedside. Patient status is: Inpatient because of ongoing nausea and vomiting Level of care: Telemetry   Dispo: The patient is from: Home            Anticipated disposition: TBD pending clinical improvement  Objective: Vitals last 24 hrs: Vitals:   09/03/22 1210 09/03/22 1213 09/03/22 1941 09/04/22 0523  BP: 127/83  (!) 144/108 (!) 179/99  Pulse: (!) 110 66 (!) 57 97  Resp: 17  16 16   Temp: 98.3 F (36.8 C)  99.6 F (37.6 C) 98.9 F (37.2 C)  TempSrc: Oral     SpO2:  100% 100% 100%  Weight:      Height:       Weight change:   Physical Examination:  General exam: alert awake, older than stated age HEENT:Oral mucosa moist, Ear/Nose WNL grossly Respiratory system: bilaterally clear BS, no use of accessory muscle Cardiovascular system: S1 & S2 +, No JVD. Gastrointestinal system: Abdomen soft, generalized nonspecific tenderness, BS +  Nervous System:Alert, awake, moving extremities. Extremities: LE edema neg,distal peripheral pulses palpable.  Skin: No rashes,no icterus. MSK: Normal muscle bulk,tone, power  Medications reviewed:  Scheduled Meds:  insulin aspart  0-15 Units Subcutaneous TID WC   insulin aspart  0-5 Units Subcutaneous QHS   insulin glargine-yfgn  10 Units Subcutaneous Daily   melatonin  3 mg Oral QHS    metoCLOPramide (REGLAN) injection  10 mg Intravenous Q8H   pravastatin  10 mg Oral QHS   sodium chloride flush  3 mL Intravenous Q12H   Continuous Infusions:  sodium chloride     sodium chloride 75 mL/hr at 09/04/22 0152   promethazine (PHENERGAN) injection (IM or IVPB) 12.5 mg (09/04/22 0230)      Diet Order             Diet clear liquid Fluid consistency: Thin  Diet effective now                   Intake/Output Summary (Last 24 hours) at 09/04/2022 1345 Last data filed at 09/04/2022 0941 Gross per 24 hour  Intake 807.98 ml  Output --  Net 807.98 ml   Net IO Since Admission: 5,319.64 mL [09/04/22 0829]  Wt Readings from Last 3 Encounters:  09/01/22 68 kg  08/15/22 68 kg  08/01/22 68 kg     Unresulted Labs (From admission, onward)     Start     Ordered   09/03/22 0500  Basic metabolic panel  Daily,   R      09/02/22 0811   09/03/22 0500  CBC with Differential/Platelet  Daily,   R      09/02/22 0811   Signed and Held  Basic metabolic panel  (Diabetes Ketoacidosis (DKA))  STAT Now then every 4 hours ,   R      Signed and Held          Data Reviewed: I have personally reviewed following labs and imaging studies CBC: Recent Labs  Lab 09/01/22 1900 09/02/22 1128 09/03/22 0544 09/04/22 0508  WBC 13.0* 17.3* 13.3* 10.8*  NEUTROABS 11.3*  --  11.3* 8.9*  HGB 13.8 11.3* 12.3* 13.7  HCT 42.1 33.2* 36.7* 40.4  MCV 96.6 94.9 94.3 94.8  PLT 368 275 297 324   Basic Metabolic Panel: Recent Labs  Lab 09/02/22 0439 09/02/22 1128 09/03/22 0544 09/04/22 0508 09/04/22 0840  NA 132* 134* 133* 131* 133*  K 4.8 4.0 4.1 3.6 4.1  CL 96* 96* 95* 94* 96*  CO2 25 24 22  20* 20*  GLUCOSE 159* 168* 219* 168* 198*  BUN 31* 28* 19 18 18   CREATININE 2.04* 1.76* 1.48* 1.49* 1.52*  CALCIUM 8.6* 8.9 9.0 8.8* 8.6*  PHOS  --   --  2.8  --   --    GFR: Estimated Creatinine Clearance: 52.4 mL/min (A) (by C-G formula based on SCr of 1.49 mg/dL (H)). Liver Function  Tests: Recent Labs  Lab 09/01/22 1900  AST 43*  ALT 35  ALKPHOS 77  BILITOT 1.3*  PROT 9.1*  ALBUMIN 5.3*   Recent Labs  Lab 09/01/22 1900 09/03/22 0544  LIPASE 39 41   Recent Labs  Lab 09/01/22 2031 09/01/22 2300 09/02/22 1128  LATICACIDVEN 4.7* 5.8* 1.8    No results found for this or any previous visit (from the past 240 hour(s)).  Antimicrobials: Anti-infectives (From admission, onward)    None      Culture/Microbiology    Component Value Date/Time   SDES URINE, RANDOM 03/21/2020 0916   SPECREQUEST NONE 03/21/2020 0916   CULT  03/21/2020 0916    NO GROWTH Performed at [facility], [address] [place], [place] [address]    REPTSTATUS 03/22/2020 FINAL 03/21/2020 6213   Radiology Studies: DG Abd 2 Views  Result Date: 09/03/2022 CLINICAL DATA:  Nausea, vomiting. EXAM: ABDOMEN - 2 VIEW COMPARISON:  Jun 17, 2022. FINDINGS: The bowel gas pattern is normal. There is no evidence of free air. No radio-opaque calculi or other significant radiographic abnormality is seen. IMPRESSION: Negative. Electronically Signed   By: Lupita Raider M.D.   On: 09/03/2022 14:12     LOS: 3 days   Lanae Boast, MD [facility]  09/04/2022, 8:29 AM

## 2022-09-04 NOTE — Hospital Course (Addendum)
57 y.o. male with PMH significant for DM2 with gastroparesis, frequent DKA, HTN, HLD, CKD, aortic atherosclerosis, sinus bradycardia, marijuana use, glaucoma presented to the ED with nausea, vomiting, abdominal pain  just like his previous episodes of DKA  In the ED, afebrile, hemodynamically stable Initial labs with blood glucose of 300, VBG with pH 7.31, pCO2 33, HCO3 16, Labs with beta-hydroxybutyrate to 7.61, creatinine elevated 2.89, lactic acid elevated to 4.7, anion gap of 29 Urinalysis showed clear straw-colored urine with moderate hemoglobin, 80 ketones, negative leukocytes and negative nitrite Chest x-ray unremarkable Patient was admitted for DKA with insulin drip IV fluids DKA has resolved and transition to subcu insulin Patient continued to endorse intractable nausea vomiting with diabetic gastroparesis and having difficulty tolerating diet. X-ray abdomen 7/31 done to rule out bowel obstruction - had normal bowel gas pattern Patient continued to exhibit signs of dehydration, having nausea vomiting and tachycardia Recheck BMP showed normal anion gap 8/1, given bolus fluid with his dehydration improved and he was clinically feeling better.  Diet advance 8/2 and patient voiced that he would like to go home after tolerating diet.  After tolerating diet he ambulated in the hallway and felt well and he feels ready for discharge home

## 2022-09-04 NOTE — Significant Event (Signed)
Rapid Response Event Note   Reason for Call :  Tachycardia   Initial Focused Assessment:  Patient resting back in bed, denies chest pain. EKG negative for arrhythmia, and ischemia. HR sinus tach low 100's. A/O X4. Lung sounds normal. Patient previoulsly on insulin drip d/t DKA. Since transitioning off insulin drip patient unable to keep food down. AM labs showed patients Anion Gap 17 @ 0840 but trending down as of 1649 now 15.   Per MD add on Beta Hydroxy.  Give 2L [place] Bolus   Event Summary:  If Beta hydroxy is elevated, and if patients condition worsens please reach out to MD and Rapid response.  MD Notified: Jerral Ralph MD   Teresita Madura, RN

## 2022-09-05 ENCOUNTER — Encounter (HOSPITAL_COMMUNITY): Payer: Self-pay | Admitting: Emergency Medicine

## 2022-09-05 ENCOUNTER — Other Ambulatory Visit: Payer: Self-pay

## 2022-09-05 ENCOUNTER — Emergency Department (HOSPITAL_COMMUNITY)
Admission: EM | Admit: 2022-09-05 | Discharge: 2022-09-06 | Disposition: A | Discharge status: Home / Self Care | Payer: BLUE CROSS/BLUE SHIELD | Attending: Emergency Medicine | Admitting: Emergency Medicine

## 2022-09-05 DIAGNOSIS — R112 Nausea with vomiting, unspecified: Secondary | ICD-10-CM | POA: Insufficient documentation

## 2022-09-05 DIAGNOSIS — N189 Chronic kidney disease, unspecified: Secondary | ICD-10-CM | POA: Diagnosis not present

## 2022-09-05 DIAGNOSIS — Z7984 Long term (current) use of oral hypoglycemic drugs: Secondary | ICD-10-CM | POA: Diagnosis not present

## 2022-09-05 DIAGNOSIS — R1084 Generalized abdominal pain: Secondary | ICD-10-CM | POA: Insufficient documentation

## 2022-09-05 DIAGNOSIS — I129 Hypertensive chronic kidney disease with stage 1 through stage 4 chronic kidney disease, or unspecified chronic kidney disease: Secondary | ICD-10-CM | POA: Diagnosis not present

## 2022-09-05 DIAGNOSIS — Z79899 Other long term (current) drug therapy: Secondary | ICD-10-CM | POA: Insufficient documentation

## 2022-09-05 DIAGNOSIS — E1122 Type 2 diabetes mellitus with diabetic chronic kidney disease: Secondary | ICD-10-CM | POA: Insufficient documentation

## 2022-09-05 DIAGNOSIS — R109 Unspecified abdominal pain: Secondary | ICD-10-CM | POA: Diagnosis present

## 2022-09-05 DIAGNOSIS — E1110 Type 2 diabetes mellitus with ketoacidosis without coma: Secondary | ICD-10-CM | POA: Diagnosis not present

## 2022-09-05 DIAGNOSIS — Z794 Long term (current) use of insulin: Secondary | ICD-10-CM | POA: Insufficient documentation

## 2022-09-05 LAB — COMPREHENSIVE METABOLIC PANEL
ALT: 31 U/L (ref 0–44)
AST: 30 U/L (ref 15–41)
Albumin: 4.1 g/dL (ref 3.5–5.0)
Alkaline Phosphatase: 61 U/L (ref 38–126)
Anion gap: 13 (ref 5–15)
BUN: 15 mg/dL (ref 6–20)
CO2: 20 mmol/L — ABNORMAL LOW (ref 22–32)
Calcium: 8.8 mg/dL — ABNORMAL LOW (ref 8.9–10.3)
Chloride: 102 mmol/L (ref 98–111)
Creatinine, Ser: 1.45 mg/dL — ABNORMAL HIGH (ref 0.61–1.24)
GFR, Estimated: 57 mL/min — ABNORMAL LOW (ref >60)
Glucose, Bld: 211 mg/dL — ABNORMAL HIGH (ref 70–99)
Potassium: 3.5 mmol/L (ref 3.5–5.1)
Sodium: 135 mmol/L (ref 135–145)
Total Bilirubin: 0.6 mg/dL (ref 0.3–1.2)
Total Protein: 7.5 g/dL (ref 6.5–8.1)

## 2022-09-05 LAB — GLUCOSE, CAPILLARY
Glucose-Capillary: 133 mg/dL — ABNORMAL HIGH (ref 70–99)
Glucose-Capillary: 277 mg/dL — ABNORMAL HIGH (ref 70–99)

## 2022-09-05 LAB — CBC WITH DIFFERENTIAL/PLATELET
Abs Immature Granulocytes: 0.02 10*3/uL (ref 0.00–0.07)
Basophils Absolute: 0.1 10*3/uL (ref 0.0–0.1)
Basophils Relative: 1 %
Eosinophils Absolute: 0 10*3/uL (ref 0.0–0.5)
Eosinophils Relative: 0 %
HCT: 38.8 % — ABNORMAL LOW (ref 39.0–52.0)
Hemoglobin: 13.1 g/dL (ref 13.0–17.0)
Immature Granulocytes: 0 %
Lymphocytes Relative: 15 %
Lymphs Abs: 1 10*3/uL (ref 0.7–4.0)
MCH: 32.4 pg (ref 26.0–34.0)
MCHC: 33.8 g/dL (ref 30.0–36.0)
MCV: 96 fL (ref 80.0–100.0)
Monocytes Absolute: 0.5 10*3/uL (ref 0.1–1.0)
Monocytes Relative: 7 %
Neutro Abs: 5.2 10*3/uL (ref 1.7–7.7)
Neutrophils Relative %: 77 %
Platelets: 300 10*3/uL (ref 150–400)
RBC: 4.04 MIL/uL — ABNORMAL LOW (ref 4.22–5.81)
RDW: 12.3 % (ref 11.5–15.5)
WBC: 6.9 10*3/uL (ref 4.0–10.5)
nRBC: 0 % (ref 0.0–0.2)

## 2022-09-05 LAB — CBG MONITORING, ED: Glucose-Capillary: 186 mg/dL — ABNORMAL HIGH (ref 70–99)

## 2022-09-05 LAB — LIPASE, BLOOD: Lipase: 36 U/L (ref 11–51)

## 2022-09-05 MED ORDER — SODIUM CHLORIDE 0.9 % IV BOLUS
1000.0000 mL | Freq: Once | INTRAVENOUS | Status: AC
  Administered 2022-09-05: 1000 mL via INTRAVENOUS

## 2022-09-05 MED ORDER — ONDANSETRON HCL 4 MG PO TABS: 4.0000 mg | ORAL_TABLET | Freq: Three times a day (TID) | ORAL | 0 refills | Status: DC | PRN

## 2022-09-05 MED ORDER — FAMOTIDINE IN NACL 20-0.9 MG/50ML-% IV SOLN
20.0000 mg | Freq: Once | INTRAVENOUS | Status: AC
  Administered 2022-09-05: 20 mg via INTRAVENOUS
  Filled 2022-09-05: qty 50

## 2022-09-05 MED ORDER — LISINOPRIL 10 MG PO TABS
10.0000 mg | ORAL_TABLET | Freq: Every day | ORAL | Status: DC
  Administered 2022-09-05: 10 mg via ORAL
  Filled 2022-09-05: qty 1

## 2022-09-05 MED ORDER — DROPERIDOL 2.5 MG/ML IJ SOLN
2.5000 mg | Freq: Once | INTRAMUSCULAR | Status: AC
  Administered 2022-09-06: 2.5 mg via INTRAVENOUS
  Filled 2022-09-05: qty 2

## 2022-09-05 MED ORDER — PANTOPRAZOLE SODIUM 40 MG PO TBEC: 40.0000 mg | DELAYED_RELEASE_TABLET | Freq: Two times a day (BID) | ORAL | 1 refills | Status: DC

## 2022-09-05 MED ORDER — POTASSIUM CHLORIDE CRYS ER 20 MEQ PO TBCR
20.0000 meq | EXTENDED_RELEASE_TABLET | Freq: Once | ORAL | Status: AC
  Administered 2022-09-05: 20 meq via ORAL
  Filled 2022-09-05: qty 1

## 2022-09-05 MED ORDER — TRESIBA FLEXTOUCH 200 UNIT/ML ~~LOC~~ SOPN: 10.0000 [IU] | PEN_INJECTOR | Freq: Every day | SUBCUTANEOUS | 0 refills | Status: AC

## 2022-09-05 NOTE — ED Triage Notes (Signed)
Patient presents due to nausea, vomiting, hyperglycemia and gastroparesis. He was seen today around 2pm, but left AMA. Once at him, symptoms did not improve, so he called EMS back out.     EMS vitals: 188/110 BP 84 HR 98% SPO2 on room air 223 CBG

## 2022-09-05 NOTE — Plan of Care (Signed)
  Problem: Education: Goal: Ability to describe self-care measures that may prevent or decrease complications (Diabetes Survival Skills Education) will improve Outcome: Progressing   Problem: Education: Goal: Individualized Educational Video(s) Outcome: Progressing   Problem: Coping: Goal: Ability to adjust to condition or change in health will improve Outcome: Progressing
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
## 2022-09-05 NOTE — ED Provider Notes (Signed)
[place] EMERGENCY DEPARTMENT AT [facility] Provider Note   CSN: [id number] Arrival date & time: 09/05/22  2218     History {Add pertinent medical, surgical, social history, OB history to HPI:1} Chief Complaint  Patient presents with   Nausea   Emesis    James Bradford is a 57 y.o. male.  HPI   Patient with medical history including hypertension, type 2 diabetes, CKD, polysubstance use, chronic abdominal pain with frequent DKA presenting with complaints of stomach pain nausea vomiting patient states that this started upon getting home from the hospital, states around 2 PM he is not sure what provoked it, is uncontrolled nausea vomiting, seizure unable to tolerate p.o., he does endorse occasional streaks of blood in his vomit, no coffee-ground emesis, pain is epigastric, does not radiate, it is constant, denies any tearing or stabbing-like sensation, no history of aneurysms or dissections no family history of connective tissue disorders like Marfan's disease, states it feels like his typical flareups.  States he does have increased urination without dysuria hematuria, endorses fever chills cough congestion, states has not had a bowel movement since Monday, but states he has been passing gas, denies any other complaints.  I reviewed patient's chart has been seen multiple times for similar presentations, is most recent discharge this today, was admitted for uncontrolled nausea vomiting and DKA, patient had plain films of the abdomen which was unremarkable, and he was discharged home.  Home Medications Prior to Admission medications   Medication Sig Start Date End Date Taking? Authorizing Provider  insulin degludec (TRESIBA FLEXTOUCH) 200 UNIT/ML FlexTouch Pen Inject 10 Units into the skin daily. 09/05/22 10/05/22  Lanae Boast, MD  lisinopril (ZESTRIL) 10 MG tablet Take 10 mg by mouth at bedtime.    [provider]  metFORMIN (GLUCOPHAGE) 1000 MG tablet Take 1 tablet (1,000  mg total) by mouth 2 (two) times daily with a meal. 03/22/22   Rodolph Bong, MD  Multiple Vitamins-Minerals (MENS 50+ MULTIVITAMIN) TABS Take 1 tablet by mouth daily.    [provider]  ondansetron (ZOFRAN) 4 MG tablet Take 1 tablet (4 mg total) by mouth every 8 (eight) hours as needed for up to 30 doses for nausea or vomiting. 09/05/22   Lanae Boast, MD  pantoprazole (PROTONIX) 40 MG tablet Take 1 tablet (40 mg total) by mouth 2 (two) times daily before a meal. 09/05/22   Lanae Boast, MD  pravastatin (PRAVACHOL) 10 MG tablet Take 1 tablet (10 mg total) by mouth daily. Patient taking differently: Take 10 mg by mouth at bedtime. 03/22/22   Rodolph Bong, MD  sildenafil (VIAGRA) 100 MG tablet Take 100 mg by mouth daily as needed for erectile dysfunction.    [provider]      Allergies    Ibuprofen, Pork-derived products, and Asa [aspirin]    Review of Systems   Review of Systems  Constitutional:  Positive for chills and fever.  Respiratory:  Negative for shortness of breath.   Cardiovascular:  Negative for chest pain.  Gastrointestinal:  Positive for abdominal pain, nausea and vomiting.  Neurological:  Negative for headaches.    Physical Exam Updated Vital Signs BP (!) 205/100 (BP Location: Left Arm)   Pulse 85   Temp 99.2 F (37.3 C) (Oral)   Resp 18   SpO2 100%  Physical Exam Vitals and nursing note reviewed.  Constitutional:      General: He is not in acute distress.    Appearance: He is  not ill-appearing.     Comments: Upon entering the room patient was found resting comfortably and immediately asked for narcotic medication for his pain.  HENT:     Head: Normocephalic and atraumatic.     Nose: No congestion.  Eyes:     Conjunctiva/sclera: Conjunctivae normal.  Cardiovascular:     Rate and Rhythm: Normal rate and regular rhythm.     Pulses: Normal pulses.     Heart sounds: No murmur heard.    No friction rub. No gallop.  Pulmonary:     Effort:  No respiratory distress.     Breath sounds: No wheezing, rhonchi or rales.  Abdominal:     Palpations: Abdomen is soft.     Tenderness: There is abdominal tenderness. There is no right CVA tenderness or left CVA tenderness.     Comments: Abdomen nondistended, soft, dull to percussion, patient has diffuse tenderness but seem to be more focalized in the epigastric region, there is no guarding or rebound has or peritoneal sign.  Skin:    General: Skin is warm and dry.  Neurological:     Mental Status: He is alert.  Psychiatric:        Mood and Affect: Mood normal.     ED Results / Procedures / Treatments   Labs (all labs ordered are listed, but only abnormal results are displayed) Labs Reviewed  CBG MONITORING, ED - Abnormal; Notable for the following components:      Result Value   Glucose-Capillary 186 (*)    All other components within normal limits  RAPID URINE DRUG SCREEN, HOSP PERFORMED  URINALYSIS, ROUTINE W REFLEX MICROSCOPIC  COMPREHENSIVE METABOLIC PANEL  CBC WITH DIFFERENTIAL/PLATELET  LIPASE, BLOOD    EKG None  Radiology No results found.  Procedures Procedures  {Document cardiac monitor, telemetry assessment procedure when appropriate:1}  Medications Ordered in ED Medications  sodium chloride 0.9 % bolus 1,000 mL (has no administration in time range)  famotidine (PEPCID) IVPB 20 mg premix (has no administration in time range)    ED Course/ Medical Decision Making/ A&P   {   Click here for ABCD2, HEART and other calculatorsREFRESH Note before signing :1}                              Medical Decision Making Amount and/or Complexity of Data Reviewed Labs: ordered.  Risk Prescription drug management.   This patient presents to the ED for concern of abdominal pain, this involves an extensive number of treatment options, and is a complaint that carries with it a high risk of complications and morbidity.  The differential diagnosis includes delay,  dissection, pneumonia, PE, ACS, bowel obstruction, volvulus, pancreatitis,    Additional history obtained:  Additional history obtained from N/A External records from outside source obtained and reviewed including recent ER notes   Co morbidities that complicate the patient evaluation  Diabetes, gastroparesis, chronic abdominal pain  Social Determinants of Health:  Polysubstance use    Lab Tests:  I Ordered, and personally interpreted labs.  The pertinent results include:  ***   Imaging Studies ordered:  I ordered imaging studies including ***  I independently visualized and interpreted imaging which showed *** I agree with the radiologist interpretation   Cardiac Monitoring:  The patient was maintained on a cardiac monitor.  I personally viewed and interpreted the cardiac monitored which showed an underlying rhythm of: ***   Medicines ordered and prescription drug  management:  I ordered medication including ***  for ***  I have reviewed the patients home medicines and have made adjustments as needed  Critical Interventions:  ***   Reevaluation:  Presents with abdominal pain, on my exam he was resting comfortably but asking for narcotic medication, he was notably hypertensive, abdomen was tender but seems more localized in the epigastric region, reviewed previous lab workup which was unremarkable at 5 AM, will repeat screening lab workup, provide patient with fluids, antiemetics, and continue to monitor.  Consultations Obtained:  I requested consultation with the ***,  and discussed lab and imaging findings as well as pertinent plan - they recommend: ***    Test Considered:  ***    Rule out ****    Dispostion and problem list  After consideration of the diagnostic results and the patients response to treatment, I feel that the patent would benefit from ***.       {Document critical care time when appropriate:1} {Document review of labs and  clinical decision tools ie heart score, Chads2Vasc2 etc:1}  {Document your independent review of radiology images, and any outside records:1} {Document your discussion with family members, caretakers, and with consultants:1} {Document social determinants of health affecting pt's care:1} {Document your decision making why or why not admission, treatments were needed:1} Final Clinical Impression(s) / ED Diagnoses Final diagnoses:  None    Rx / DC Orders ED Discharge Orders     None

## 2022-09-06 MED ORDER — DICYCLOMINE HCL 10 MG PO CAPS
10.0000 mg | ORAL_CAPSULE | Freq: Once | ORAL | Status: AC
  Administered 2022-09-06: 10 mg via ORAL
  Filled 2022-09-06: qty 1

## 2022-09-06 NOTE — ED Notes (Signed)
Pt care taken, pt is wanting pain medication, gave meds that are ordered.

## 2022-09-06 NOTE — ED Notes (Signed)
Pt requested to leave. Stated he feels better. Provider made aware.

## 2022-09-06 NOTE — Discharge Instructions (Signed)
Please continue with the antiemetics that were prescribed to you from your prior visit, I recommend a bland diet, I would follow-up with gastroenterology for further evaluation.  Come back to the emergency department if you develop chest pain, shortness of breath, severe abdominal pain, uncontrolled nausea, vomiting, diarrhea.

## 2022-11-12 ENCOUNTER — Emergency Department (HOSPITAL_COMMUNITY): Payer: BLUE CROSS/BLUE SHIELD

## 2022-11-12 ENCOUNTER — Encounter (HOSPITAL_COMMUNITY): Payer: Self-pay

## 2022-11-12 ENCOUNTER — Other Ambulatory Visit: Payer: Self-pay

## 2022-11-12 ENCOUNTER — Emergency Department (HOSPITAL_COMMUNITY)
Admission: EM | Admit: 2022-11-12 | Discharge: 2022-11-12 | Disposition: A | Discharge status: Home / Self Care | Payer: BLUE CROSS/BLUE SHIELD | Attending: Emergency Medicine | Admitting: Emergency Medicine

## 2022-11-12 DIAGNOSIS — M7989 Other specified soft tissue disorders: Secondary | ICD-10-CM | POA: Diagnosis not present

## 2022-11-12 DIAGNOSIS — W19XXXA Unspecified fall, initial encounter: Secondary | ICD-10-CM | POA: Insufficient documentation

## 2022-11-12 DIAGNOSIS — S63501A Unspecified sprain of right wrist, initial encounter: Secondary | ICD-10-CM | POA: Diagnosis not present

## 2022-11-12 DIAGNOSIS — S6991XA Unspecified injury of right wrist, hand and finger(s), initial encounter: Secondary | ICD-10-CM | POA: Diagnosis present

## 2022-11-12 MED ORDER — HYDROCODONE-ACETAMINOPHEN 5-325 MG PO TABS
1.0000 | ORAL_TABLET | Freq: Once | ORAL | Status: AC
  Administered 2022-11-12: 1 via ORAL
  Filled 2022-11-12: qty 1

## 2022-11-12 NOTE — ED Provider Notes (Signed)
[place] EMERGENCY DEPARTMENT AT [facility] Provider Note   CSN: [id number] Arrival date & time: 11/12/22  4098     History  Chief Complaint  Patient presents with   Wrist Pain    Denver Kronick is a 57 y.o. male.  Presents to the urgency department for evaluation of right wrist injury.  Patient reports pain and swelling of the wrist after the fall.  No other injury.       Home Medications Prior to Admission medications   Medication Sig Start Date End Date Taking? Authorizing Provider  lisinopril (ZESTRIL) 10 MG tablet Take 10 mg by mouth at bedtime.    [provider]  metFORMIN (GLUCOPHAGE) 1000 MG tablet Take 1 tablet (1,000 mg total) by mouth 2 (two) times daily with a meal. 03/22/22   Rodolph Bong, MD  Multiple Vitamins-Minerals (MENS 50+ MULTIVITAMIN) TABS Take 1 tablet by mouth daily.    [provider]  ondansetron (ZOFRAN) 4 MG tablet Take 1 tablet (4 mg total) by mouth every 8 (eight) hours as needed for up to 30 doses for nausea or vomiting. 09/05/22   Lanae Boast, MD  pantoprazole (PROTONIX) 40 MG tablet Take 1 tablet (40 mg total) by mouth 2 (two) times daily before a meal. 09/05/22   Lanae Boast, MD  pravastatin (PRAVACHOL) 10 MG tablet Take 1 tablet (10 mg total) by mouth daily. Patient taking differently: Take 10 mg by mouth at bedtime. 03/22/22   Rodolph Bong, MD  sildenafil (VIAGRA) 100 MG tablet Take 100 mg by mouth daily as needed for erectile dysfunction.    [provider]      Allergies    Ibuprofen, Pork-derived products, and Asa [aspirin]    Review of Systems   Review of Systems  Physical Exam Updated Vital Signs BP (!) 144/83 (BP Location: Left Arm)   Pulse 96   Temp 98.7 F (37.1 C) (Oral)   Resp 14   Ht 5\' 7"  (1.702 m)   Wt 68 kg   SpO2 100%   BMI 23.49 kg/m  Physical Exam Vitals and nursing note reviewed.  Constitutional:      Appearance: Normal appearance.  HENT:     Head: Atraumatic.   Musculoskeletal:     Right wrist: Swelling, tenderness and snuff box tenderness present. No deformity. Decreased range of motion.  Skin:    General: Skin is warm.     Findings: No bruising or laceration.  Neurological:     Mental Status: He is alert.     Sensory: Sensation is intact.     Motor: Motor function is intact.     ED Results / Procedures / Treatments   Labs (all labs ordered are listed, but only abnormal results are displayed) Labs Reviewed - No data to display  EKG None  Radiology DG Wrist Complete Right  Result Date: 11/12/2022 CLINICAL DATA:  Pain and swelling after fall. EXAM: RIGHT WRIST - COMPLETE 3+ VIEW COMPARISON:  08/23/2022. FINDINGS: There is no evidence of acute fracture or dislocation. Fixation hardware is noted at the second metacarpal. Mild degenerative changes are noted at the wrist. Soft tissues are unremarkable. IMPRESSION: No acute fracture or dislocation. Electronically Signed   By: Thornell Sartorius M.D.   On: 11/12/2022 04:07    Procedures Procedures    Medications Ordered in ED Medications  HYDROcodone-acetaminophen (NORCO/VICODIN) 5-325 MG per tablet 1 tablet (has no administration in time range)    ED Course/ Medical Decision Making/ A&P  Medical Decision Making Amount and/or Complexity of Data Reviewed Radiology: ordered.   Presents for evaluation after a fall.  Examination of the right wrist reveals slight swelling, mild decrease in range of motion with tenderness.  Patient does have anatomical snuffbox tenderness.  No obvious fracture noted on x-ray.  Will place in splint, have follow-up with hand surgery.        Final Clinical Impression(s) / ED Diagnoses Final diagnoses:  Sprain of right wrist, initial encounter    Rx / DC Orders ED Discharge Orders     None         Secundino Ellithorpe, Canary Brim, MD 11/12/22 [phone]

## 2022-11-12 NOTE — ED Triage Notes (Signed)
Patient reports he fell outside today and hurt his right wrist.  Patient is able to move it and move fingers. Mild swelling.

## 2022-11-12 NOTE — Discharge Instructions (Addendum)
See Dr. Denese Killings, the hand specialist, in the office.  Schedule an appointment for 1 week from now for recheck.  Wear the splint at all times except when bathing until you see Dr. Denese Killings.

## 2022-11-23 ENCOUNTER — Emergency Department (HOSPITAL_COMMUNITY): Payer: BLUE CROSS/BLUE SHIELD

## 2022-11-23 ENCOUNTER — Encounter (HOSPITAL_COMMUNITY): Payer: Self-pay | Admitting: Emergency Medicine

## 2022-11-23 ENCOUNTER — Other Ambulatory Visit: Payer: Self-pay

## 2022-11-23 ENCOUNTER — Observation Stay (HOSPITAL_COMMUNITY)
Admission: EM | Admit: 2022-11-23 | Discharge: 2022-11-24 | Disposition: A | Discharge status: Home / Self Care | Payer: BLUE CROSS/BLUE SHIELD | Attending: Internal Medicine | Admitting: Internal Medicine

## 2022-11-23 DIAGNOSIS — I129 Hypertensive chronic kidney disease with stage 1 through stage 4 chronic kidney disease, or unspecified chronic kidney disease: Secondary | ICD-10-CM | POA: Insufficient documentation

## 2022-11-23 DIAGNOSIS — Z8659 Personal history of other mental and behavioral disorders: Secondary | ICD-10-CM | POA: Diagnosis not present

## 2022-11-23 DIAGNOSIS — E11649 Type 2 diabetes mellitus with hypoglycemia without coma: Secondary | ICD-10-CM | POA: Insufficient documentation

## 2022-11-23 DIAGNOSIS — Z800 Family history of malignant neoplasm of digestive organs: Secondary | ICD-10-CM | POA: Insufficient documentation

## 2022-11-23 DIAGNOSIS — E1122 Type 2 diabetes mellitus with diabetic chronic kidney disease: Secondary | ICD-10-CM | POA: Insufficient documentation

## 2022-11-23 DIAGNOSIS — Z794 Long term (current) use of insulin: Secondary | ICD-10-CM | POA: Diagnosis not present

## 2022-11-23 DIAGNOSIS — K219 Gastro-esophageal reflux disease without esophagitis: Secondary | ICD-10-CM | POA: Insufficient documentation

## 2022-11-23 DIAGNOSIS — R112 Nausea with vomiting, unspecified: Secondary | ICD-10-CM | POA: Diagnosis not present

## 2022-11-23 DIAGNOSIS — N179 Acute kidney failure, unspecified: Secondary | ICD-10-CM | POA: Diagnosis not present

## 2022-11-23 DIAGNOSIS — E785 Hyperlipidemia, unspecified: Secondary | ICD-10-CM | POA: Diagnosis not present

## 2022-11-23 DIAGNOSIS — N1831 Chronic kidney disease, stage 3a: Secondary | ICD-10-CM | POA: Diagnosis present

## 2022-11-23 DIAGNOSIS — E162 Hypoglycemia, unspecified: Principal | ICD-10-CM

## 2022-11-23 DIAGNOSIS — R1030 Lower abdominal pain, unspecified: Secondary | ICD-10-CM | POA: Diagnosis present

## 2022-11-23 LAB — CBC WITH DIFFERENTIAL/PLATELET
Abs Immature Granulocytes: 0.01 10*3/uL (ref 0.00–0.07)
Basophils Absolute: 0.1 10*3/uL (ref 0.0–0.1)
Basophils Relative: 1 %
Eosinophils Absolute: 0.1 10*3/uL (ref 0.0–0.5)
Eosinophils Relative: 3 %
HCT: 36.5 % — ABNORMAL LOW (ref 39.0–52.0)
Hemoglobin: 12.2 g/dL — ABNORMAL LOW (ref 13.0–17.0)
Immature Granulocytes: 0 %
Lymphocytes Relative: 32 %
Lymphs Abs: 1.4 10*3/uL (ref 0.7–4.0)
MCH: 31.5 pg (ref 26.0–34.0)
MCHC: 33.4 g/dL (ref 30.0–36.0)
MCV: 94.3 fL (ref 80.0–100.0)
Monocytes Absolute: 0.6 10*3/uL (ref 0.1–1.0)
Monocytes Relative: 14 %
Neutro Abs: 2.2 10*3/uL (ref 1.7–7.7)
Neutrophils Relative %: 50 %
Platelets: 326 10*3/uL (ref 150–400)
RBC: 3.87 MIL/uL — ABNORMAL LOW (ref 4.22–5.81)
RDW: 13.9 % (ref 11.5–15.5)
WBC: 4.4 10*3/uL (ref 4.0–10.5)
nRBC: 0 % (ref 0.0–0.2)

## 2022-11-23 LAB — CBG MONITORING, ED
Glucose-Capillary: 27 mg/dL — CL (ref 70–99)
Glucose-Capillary: 50 mg/dL — ABNORMAL LOW (ref 70–99)
Glucose-Capillary: 116 mg/dL — ABNORMAL HIGH (ref 70–99)
Glucose-Capillary: 78 mg/dL (ref 70–99)
Glucose-Capillary: 80 mg/dL (ref 70–99)

## 2022-11-23 LAB — COMPREHENSIVE METABOLIC PANEL
ALT: 22 U/L (ref 0–44)
AST: 28 U/L (ref 15–41)
Albumin: 4.5 g/dL (ref 3.5–5.0)
Alkaline Phosphatase: 70 U/L (ref 38–126)
Anion gap: 14 (ref 5–15)
BUN: 31 mg/dL — ABNORMAL HIGH (ref 6–20)
CO2: 20 mmol/L — ABNORMAL LOW (ref 22–32)
Calcium: 9.7 mg/dL (ref 8.9–10.3)
Chloride: 104 mmol/L (ref 98–111)
Creatinine, Ser: 2.04 mg/dL — ABNORMAL HIGH (ref 0.61–1.24)
GFR, Estimated: 38 mL/min — ABNORMAL LOW (ref >60)
Glucose, Bld: 45 mg/dL — ABNORMAL LOW (ref 70–99)
Potassium: 4.3 mmol/L (ref 3.5–5.1)
Sodium: 138 mmol/L (ref 135–145)
Total Bilirubin: 0.4 mg/dL (ref 0.3–1.2)
Total Protein: 7.7 g/dL (ref 6.5–8.1)

## 2022-11-23 LAB — URINALYSIS, ROUTINE W REFLEX MICROSCOPIC
Bilirubin Urine: NEGATIVE
Glucose, UA: NEGATIVE mg/dL
Hgb urine dipstick: NEGATIVE
Ketones, ur: NEGATIVE mg/dL
Leukocytes,Ua: NEGATIVE
Nitrite: NEGATIVE
Protein, ur: NEGATIVE mg/dL
Specific Gravity, Urine: 1.008 (ref 1.005–1.030)
pH: 5 (ref 5.0–8.0)

## 2022-11-23 LAB — GLUCOSE, CAPILLARY
Glucose-Capillary: 241 mg/dL — ABNORMAL HIGH (ref 70–99)
Glucose-Capillary: 29 mg/dL — CL (ref 70–99)
Glucose-Capillary: 36 mg/dL — CL (ref 70–99)
Glucose-Capillary: 37 mg/dL — CL (ref 70–99)
Glucose-Capillary: 39 mg/dL — CL (ref 70–99)
Glucose-Capillary: 232 mg/dL — ABNORMAL HIGH (ref 70–99)

## 2022-11-23 LAB — GLUCOSE, RANDOM: Glucose, Bld: 179 mg/dL — ABNORMAL HIGH (ref 70–99)

## 2022-11-23 LAB — LIPASE, BLOOD: Lipase: 42 U/L (ref 11–51)

## 2022-11-23 MED ORDER — DEXTROSE 50 % IV SOLN
25.0000 mL | Freq: Once | INTRAVENOUS | Status: AC
  Administered 2022-11-23: 25 mL via INTRAVENOUS

## 2022-11-23 MED ORDER — DEXTROSE 50 % IV SOLN
INTRAVENOUS | Status: AC
  Administered 2022-11-23: 25 mL via INTRAVENOUS
  Filled 2022-11-23: qty 50

## 2022-11-23 MED ORDER — PANTOPRAZOLE SODIUM 40 MG PO TBEC
40.0000 mg | DELAYED_RELEASE_TABLET | Freq: Two times a day (BID) | ORAL | Status: DC
  Administered 2022-11-23 – 2022-11-24 (×3): 40 mg via ORAL
  Filled 2022-11-23 (×3): qty 1

## 2022-11-23 MED ORDER — SODIUM CHLORIDE 0.9 % IV BOLUS
500.0000 mL | Freq: Once | INTRAVENOUS | Status: AC
  Administered 2022-11-23: 500 mL via INTRAVENOUS

## 2022-11-23 MED ORDER — RIVAROXABAN 10 MG PO TABS
10.0000 mg | ORAL_TABLET | Freq: Every day | ORAL | Status: DC
Start: 2022-11-23 — End: 2022-11-25
  Administered 2022-11-23 – 2022-11-24 (×2): 10 mg via ORAL
  Filled 2022-11-23 (×2): qty 1

## 2022-11-23 MED ORDER — ACETAMINOPHEN 325 MG PO TABS
650.0000 mg | ORAL_TABLET | Freq: Four times a day (QID) | ORAL | Status: DC | PRN
  Administered 2022-11-23 – 2022-11-24 (×2): 650 mg via ORAL
  Filled 2022-11-23 (×2): qty 2

## 2022-11-23 MED ORDER — LACTATED RINGERS IV SOLN: INTRAVENOUS | Status: AC

## 2022-11-23 MED ORDER — ACETAMINOPHEN 650 MG RE SUPP: 650.0000 mg | Freq: Four times a day (QID) | RECTAL | Status: DC | PRN

## 2022-11-23 MED ORDER — ONDANSETRON HCL 4 MG/2ML IJ SOLN: 4.0000 mg | Freq: Four times a day (QID) | INTRAMUSCULAR | Status: DC | PRN

## 2022-11-23 MED ORDER — SENNOSIDES-DOCUSATE SODIUM 8.6-50 MG PO TABS: 1.0000 | ORAL_TABLET | Freq: Every evening | ORAL | Status: DC | PRN

## 2022-11-23 MED ORDER — INSULIN ASPART 100 UNIT/ML IJ SOLN
0.0000 [IU] | Freq: Three times a day (TID) | INTRAMUSCULAR | Status: DC
  Administered 2022-11-23: 2 [IU] via SUBCUTANEOUS

## 2022-11-23 MED ORDER — DEXTROSE 50 % IV SOLN: 25.0000 mL | Freq: Once | INTRAVENOUS | Status: AC

## 2022-11-23 MED ORDER — PRAVASTATIN SODIUM 10 MG PO TABS
10.0000 mg | ORAL_TABLET | Freq: Every day | ORAL | Status: DC
  Administered 2022-11-23: 10 mg via ORAL
  Filled 2022-11-23 (×2): qty 1

## 2022-11-23 MED ORDER — DEXTROSE 50 % IV SOLN
1.0000 | Freq: Once | INTRAVENOUS | Status: AC
Start: 2022-11-23 — End: 2022-11-23
  Administered 2022-11-23: 50 mL via INTRAVENOUS
  Filled 2022-11-23: qty 50

## 2022-11-23 MED ORDER — LOPERAMIDE HCL 2 MG PO CAPS
2.0000 mg | ORAL_CAPSULE | ORAL | Status: DC | PRN
  Administered 2022-11-23: 2 mg via ORAL
  Filled 2022-11-23: qty 1

## 2022-11-23 MED ORDER — IOHEXOL 350 MG/ML SOLN
75.0000 mL | Freq: Once | INTRAVENOUS | Status: AC | PRN
  Administered 2022-11-23: 75 mL via INTRAVENOUS

## 2022-11-23 NOTE — Progress Notes (Signed)
Patient called on call light, patient states he feel his blood sugar was low. This RN went to assess patient, patient alert and oriented, diaphoretic. Obtained BG, BG 37 at 1945. Notified MD, gave patient 3 cups PO of apple juice per patient request.  Rechecked BG at 2002, BG 39, gave patient a cup of orange juice. Notified attending, Admin 1 amp of d50 per orders and STAT lab glucose placed. Patient states that he felt better, no longer diaphoretic.  Rechecked BG at 2112, BG 232. Notified attending for update. Call light within reach, patient states he feels fine.

## 2022-11-23 NOTE — ED Provider Notes (Signed)
[place] EMERGENCY DEPARTMENT AT [facility] Provider Note   CSN: [id number] Arrival date & time: 11/23/22  9562     History  Chief Complaint  Patient presents with   Hypoglycemia    James Bradford is a 57 y.o. male.  Patient presents to the emergency department complaining of hypoglycemia.  He reports feeling sick earlier in the morning on Saturday and states he vomited that morning after taking his medications.  At 12:30 AM (Sunday morning) he took additional diabetic medications as he typically takes these twice daily.  These include metformin and glipizide.  At that time he noticed his blood sugar was low.  Upon arrival at the emergency department CBG was noted to be 27.  The patient complains of right upper quadrant abdominal pain and mild nausea but denies other symptoms at this time.  Past medical history significant for type II DM, stage III CKD, hypertension, alcohol abuse   Hypoglycemia      Home Medications Prior to Admission medications   Medication Sig Start Date End Date Taking? Authorizing Provider  lisinopril (ZESTRIL) 10 MG tablet Take 10 mg by mouth at bedtime.    [provider]  metFORMIN (GLUCOPHAGE) 1000 MG tablet Take 1 tablet (1,000 mg total) by mouth 2 (two) times daily with a meal. 03/22/22   Rodolph Bong, MD  Multiple Vitamins-Minerals (MENS 50+ MULTIVITAMIN) TABS Take 1 tablet by mouth daily.    [provider]  ondansetron (ZOFRAN) 4 MG tablet Take 1 tablet (4 mg total) by mouth every 8 (eight) hours as needed for up to 30 doses for nausea or vomiting. 09/05/22   Lanae Boast, MD  pantoprazole (PROTONIX) 40 MG tablet Take 1 tablet (40 mg total) by mouth 2 (two) times daily before a meal. 09/05/22   Lanae Boast, MD  pravastatin (PRAVACHOL) 10 MG tablet Take 1 tablet (10 mg total) by mouth daily. Patient taking differently: Take 10 mg by mouth at bedtime. 03/22/22   Rodolph Bong, MD  sildenafil (VIAGRA) 100 MG tablet Take  100 mg by mouth daily as needed for erectile dysfunction.    [provider]      Allergies    Ibuprofen, Pork-derived products, and Asa [aspirin]    Review of Systems   Review of Systems  Physical Exam Updated Vital Signs BP 124/76   Pulse 88   Temp 98.5 F (36.9 C) (Oral)   Resp (!) 31   Ht 5\' 7"  (1.702 m)   Wt 68 kg   SpO2 100%   BMI 23.49 kg/m  Physical Exam Vitals and nursing note reviewed.  Constitutional:      General: He is not in acute distress.    Appearance: He is well-developed.  HENT:     Head: Normocephalic and atraumatic.  Eyes:     Conjunctiva/sclera: Conjunctivae normal.  Cardiovascular:     Rate and Rhythm: Normal rate and regular rhythm.     Heart sounds: No murmur heard. Pulmonary:     Effort: Pulmonary effort is normal. No respiratory distress.     Breath sounds: Normal breath sounds.  Abdominal:     Palpations: Abdomen is soft.     Tenderness: There is abdominal tenderness (Right upper quadrant).  Musculoskeletal:        General: No swelling.     Cervical back: Neck supple.  Skin:    General: Skin is warm and dry.     Capillary Refill: Capillary refill takes less than 2 seconds.  Neurological:     Mental Status: He is alert.  Psychiatric:        Mood and Affect: Mood normal.     ED Results / Procedures / Treatments   Labs (all labs ordered are listed, but only abnormal results are displayed) Labs Reviewed  CBC WITH DIFFERENTIAL/PLATELET - Abnormal; Notable for the following components:      Result Value   RBC 3.87 (*)    Hemoglobin 12.2 (*)    HCT 36.5 (*)    All other components within normal limits  COMPREHENSIVE METABOLIC PANEL - Abnormal; Notable for the following components:   CO2 20 (*)    Glucose, Bld 45 (*)    BUN 31 (*)    Creatinine, Ser 2.04 (*)    GFR, Estimated 38 (*)    All other components within normal limits  URINALYSIS, ROUTINE W REFLEX MICROSCOPIC - Abnormal; Notable for the following components:    APPearance HAZY (*)    All other components within normal limits  CBG MONITORING, ED - Abnormal; Notable for the following components:   Glucose-Capillary 27 (*)    All other components within normal limits  CBG MONITORING, ED - Abnormal; Notable for the following components:   Glucose-Capillary 50 (*)    All other components within normal limits  CBG MONITORING, ED - Abnormal; Notable for the following components:   Glucose-Capillary 116 (*)    All other components within normal limits  LIPASE, BLOOD    EKG None  Radiology US Abdomen Limited RUQ (LIVER/GB)  Result Date: 11/23/2022 CLINICAL DATA:  57 year old male with history of right upper quadrant abdominal pain. Hypoglycemia. EXAM: ULTRASOUND ABDOMEN LIMITED RIGHT UPPER QUADRANT COMPARISON:  None Available. FINDINGS: Gallbladder: No gallstones or wall thickening visualized. No sonographic Murphy sign noted by sonographer. Common bile duct: Diameter: 3.6 mm Liver: No focal lesion identified. Within normal limits in parenchymal echogenicity. Portal vein is patent on color Doppler imaging with normal direction of blood flow towards the liver. Other: None. IMPRESSION: 1. No acute findings to account for the patient's symptoms. Specifically, no evidence of cholelithiasis and no findings to suggest an acute cholecystitis are noted at this time. Electronically Signed   By: Trudie Reed M.D.   On: 11/23/2022 05:42    Procedures Procedures    Medications Ordered in ED Medications  dextrose 50 % solution 25 mL (25 mLs Intravenous Given 11/23/22 0410)  dextrose 50 % solution 25 mL (25 mLs Intravenous Given 11/23/22 0307)  iohexol (OMNIPAQUE) 350 MG/ML injection 75 mL (75 mLs Intravenous Contrast Given 11/23/22 0550)    ED Course/ Medical Decision Making/ A&P                                 Medical Decision Making Amount and/or Complexity of Data Reviewed Labs: ordered. Radiology: ordered.  Risk Prescription drug  management.   This patient presents to the ED for concern of hyperglycemia, this involves an extensive number of treatment options, and is a complaint that carries with it a high risk of complications and morbidity.  The differential diagnosis includes decreased oral intake, adverse effect of medication, infection, others   Co morbidities that complicate the patient evaluation  Type II DM, stage III CKD   Additional history obtained:   External records from outside source obtained and reviewed including discharge summary from August 2, patient had been admitted for DKA   Lab Tests:  I Ordered,  and personally interpreted labs.  The pertinent results include: Initial CBG 27, repeat 50 after half amp D50, lipase 42, creatinine 2.04 (baseline appears to be around 1.2)   Imaging Studies ordered:  I ordered imaging studies including right upper quadrant ultrasound and CT abdomen pelvis with contrast I independently visualized and interpreted imaging which showed no acute findings on right upper quadrant ultrasound.  CT pending at time of shift handoff I agree with the radiologist interpretation   Problem List / ED Course / Critical interventions / Medication management   I ordered medication including D50 for hypoglycemia Reevaluation of the patient after these medicines showed that the patient improved I have reviewed the patients home medicines and have made adjustments as needed  Test / Admission - Considered:  Patient care being transferred to Janene Madeira, PA-at shift handoff.  Disposition pending results of CT abdomen pelvis with contrast and updated CBG.C unclear as to why patient's glucose dropped as drastically as it did.  If no surgical etiology noted on CT abdomen pelvis  patient may be able to be discharged home with close follow-up for repeat labs to check kidney function.  Patient may also be candidate for admission based on AKI criteria.         Final Clinical  Impression(s) / ED Diagnoses Final diagnoses:  Hypoglycemia  Acute kidney injury superimposed on chronic kidney disease [facility])    Rx / DC Orders ED Discharge Orders     None         Pamala Duffel 11/23/22 9562    Marily Memos, MD 11/23/22 0710

## 2022-11-23 NOTE — ED Provider Notes (Signed)
Patient care taken over at shift handoff from outgoing provider.  For full HPI, please refer to previous providers note.  Plan is to reassess after CT abdomen pelvis.  Patient will require admission for AKI.  Physical Exam  BP 124/76   Pulse 88   Temp 98.5 F (36.9 C) (Oral)   Resp (!) 31   Ht 5\' 7"  (1.702 m)   Wt 68 kg   SpO2 100%   BMI 23.49 kg/m   Physical Exam Vitals and nursing note reviewed.  Constitutional:      Appearance: Normal appearance.  HENT:     Head: Normocephalic and atraumatic.     Mouth/Throat:     Mouth: Mucous membranes are moist.  Eyes:     General: No scleral icterus. Cardiovascular:     Rate and Rhythm: Normal rate and regular rhythm.     Pulses: Normal pulses.     Heart sounds: Normal heart sounds.  Pulmonary:     Effort: Pulmonary effort is normal.     Breath sounds: Normal breath sounds.  Abdominal:     General: Abdomen is flat.     Palpations: Abdomen is soft.     Tenderness: There is no abdominal tenderness.  Musculoskeletal:        General: No deformity.  Skin:    General: Skin is warm.     Findings: No rash.  Neurological:     General: No focal deficit present.     Mental Status: He is alert.  Psychiatric:        Mood and Affect: Mood normal.     Procedures  Procedures  ED Course / MDM    Medical Decision Making Amount and/or Complexity of Data Reviewed Labs: ordered. Radiology: ordered.  Risk Prescription drug management.   57 year old male presents for evaluation of hypoglycemia and vomiting.  On physical examination, patient is afebrile and appears in no acute distress.  Workup is significant for AKI with BUN 31, creatinine 2.04.  Patient was given 50 cc D50 which increased his sugar to 116.  CBC with no evidence of leukocytosis.  Hemoglobin is around baseline.  CT abdomen pelvis, ultrasound of right quadrant with no acute findings. I spoke to Dr. Chipper Herb medicine teaching service who agrees admit the  patient.  Disposition Admit.       Jeanelle Malling, PA 11/23/22 1010    Mesner, Barbara Cower, MD 11/24/22 [phone]

## 2022-11-23 NOTE — H&P (Cosign Needed Addendum)
Date: 11/23/2022               Patient Name:  James Bradford MRN: [id number]  DOB: 22-Mar-1965 Age / Sex: 57 y.o., male   PCP: Medicine, [facility]              Medical Service: Internal Medicine Teaching Service              Attending Physician: Dr. Debe Coder    First Contact: Deatra Ina, MS 3 Pager: [phone]  Second Contact: Dr. Morrie Sheldon Pager: [phone]  Third Contact Dr. Rana Snare Pager: [phone]       After Hours (After 5p/  First Contact Pager: [phone]  weekends / holidays): Second Contact Pager: [phone]   Chief Complaint: Decreased PO intake  History of Present Illness:  James Bradford is a 57 year old male with medical history of T2DM (glipizide, metformin), Stage III CKD, DKA, HTN, HLD, alcohol abuse who presents for vomiting and a low blood sugar reading early this AM. The patient says that about 2 weeks ago he lost his glucose sensor and has not monitored his glucose levels since then. Over the past two to three days he has had a decreased appetite and decreased food & fluid intake. Yesterday morning he felt slightly confused similar to prior hypoglycemic episodes, then ate some peanuts and soda. Following this, he developed some nausea and vomiting. He has also had some lower abdominal pain and decreased urination. Early this morning he felt that his heart rate was fast and that he had some weakness so he called EMS. He has had hypoglycemic episodes and AKI in the past. He denies vomiting any medications and says that he has been taking them as directed over the past few days. James Bradford denies headache, fever, congestion, shortness of breath, cough, epigastric pain, upper abdominal pain, back pain, light headedness, dysuria, change in urine color, rash, recent sickness, or sick contacts. The patient notes that recently his metformin was reduced from 1000 mg BID to 500 mg BID with the addition of insulin injections, tresiba 10 units daily. James Bradford states that  he has had AKI's in the past, but does not have chronic kidney disease.  ED Course: CBG on arrival at 2:55 am was 27. Given D50. Repeat 1 hour later was 50. Given D50 again. At 6:30 am his glucose was 116, then 78 at 7:30 am. BUN 30, Cr 2.04 up from baseline of ~1.3 Bolus 500 mL NS given CT abdomen and RUQ [place] negative  Meds: -lisinopril 10 mg -metformin 500 mg BID -glipizide 10 mg BID -pravastatin 10 mg -protonix 40 mg BID -off insulin (was [place] 10 units off 1 month)  No outpatient medications have been marked as taking for the 11/23/22 encounter [facility] Encounter).    Allergies: Ibuprofen  Allergies as of 11/23/2022 - Review Complete 11/23/2022  Allergen Reaction Noted   Ibuprofen Other (See Comments) and Nausea And Vomiting 09/05/2014   Pork-derived products Other (See Comments) 06/17/2022   Asa [aspirin] Other (See Comments) 05/19/2013   Past Medical History:  Diagnosis Date   Aortic atherosclerosis (HCC) 08/24/2020   Hyperlipidemia    Hypertension    Marijuana abuse 09/28/2020   Sinus bradycardia    Stage 3 chronic kidney disease (HCC)    Type 2 diabetes mellitus (HCC)    Unspecified glaucoma 02/18/2008   Qualifier: Diagnosis of  By: Daphine Deutscher FNP, Zena Amos      Family History:  Denies family hx of MI, CVA Dad:  Pancreatic cancer Paternal uncle: DM Maternal Grandma: DM  Social History: -lives with sister -support with sister -occupation: Airline pilot  -function: independent with ADLs and IADLs -PCP: [facility] -tobacco: denies -alcohol: occasional -recreational drug: endorses marijuana (last 3-4 days ago)  Surgeries: Appendectomy, right lower extremity fracture repair, right hand has "metal plate"  Review of Systems: A complete ROS was negative except as per HPI.  Physical Exam: Blood pressure 125/80, pulse 86, temperature 98.8 F (37.1 C), temperature source Oral, resp. rate 13, height 5\' 7"  (1.702 m), weight 68 kg, SpO2 100%. Physical  Exam Constitutional:      Appearance: Normal appearance. He is normal weight.  HENT:     Head: Normocephalic.     Mouth/Throat:     Comments: tacky Eyes:     Conjunctiva/sclera: Conjunctivae normal.  Cardiovascular:     Rate and Rhythm: Normal rate and regular rhythm.     Pulses: Normal pulses.  Pulmonary:     Effort: Pulmonary effort is normal. No respiratory distress.     Breath sounds: Normal breath sounds. No wheezing or rales.  Abdominal:     General: Abdomen is flat. There is no distension.     Palpations: Abdomen is soft. There is no mass.     Tenderness: There is no abdominal tenderness. There is no right CVA tenderness, left CVA tenderness, guarding or rebound.  Musculoskeletal:     Cervical back: Normal range of motion.  Skin:    General: Skin is warm and dry.     Capillary Refill: Capillary refill takes less than 2 seconds.  Neurological:     Mental Status: He is alert. Mental status is at baseline.  Psychiatric:        Mood and Affect: Mood normal.        Behavior: Behavior normal.        Thought Content: Thought content normal.        Judgment: Judgment normal.     Imaging: ULTRASOUND ABDOMEN LIMITED RIGHT UPPER QUADRANT  IMPRESSION: 1. No acute findings to account for the patient's symptoms. Specifically, no evidence of cholelithiasis and no findings to suggest an acute cholecystitis are noted at this time.  CT ABDOMEN AND PELVIS WITH CONTRAST  IMPRESSION: No acute finding.  No bowel obstruction or visible inflammation.  Assessment & Plan by Problem: Active Problems:   * No active hospital problems. *  Summary: James Bradford is a 57 year old male with PMHx of T2DM, HTN, HLD, GERD, CKD stage III who presents with decreased appetite over past 2-3 days, nausea, vomiting x1, lower abdominal pain for 1 day who was found to be hypoglycemic in ED with AKI and was admitted for hydration.  AKI secondary to decrease PO intake  Nausea  Vomiting Current BUN 31, Cr of  2.04 in the setting of decreased PO intake, nausea, and vomiting. Anion gap 14 with CO2 of 20. Patient has had multiple AKI's in the past, all of which showed normalization of GFR to >60, and it appears that his baseline Cr level is 1.2-1.3. He does not have infectious symptoms and examination reveals tacky mucous membranes without abdominal tenderness. Ddx includes medication side effect, gastroparesis, gastroenteritis, DKA, pancreatitis, viral syndrome, alcohol withdrawal. I suspect that with loss of his glucose sensor, recent changes in metformin and addition of insulin that the patient has not had properly controlled glucose levels which led to his appetite, intake, and glucose changes over the past couple days. -Encourage PO intake -Zofran q6h PRN to assist  PO intake -AM CBC, BMP -Consider inpatient DM coordinator consult   Hypoglycemia CBG of 27 on arrival. Resolved after D5 x2. Patient has not had his insulin sensor for about two weeks and says that his prescription for the next is due this week. He states that he has not been checking his blood sugar at home. He's had decreased intake due to low appetite, but has been taking his medications as normal. I suspect that his hypoglycemia is due to decreased intake without adjusting medication regimen. -AM BMP -Encouraged PO intake w/ zofran for nausea -Continue to monitor with CBG via hypoglycemia protocol   History of Alcohol Abuse Patient reports that he only drinks about once a month. Prior hospital note on 03/21/2022 Note reads 'Initially, patient stated to admitting MD that he "only drinks socially' but then when asked again he reports drinking a pint of vodka daily in the days preceding his illness." Since patient is here in the setting of decreased appetite, confusion, nausea and vomiting yesterday the patient could be in withdrawal. Will obtain CIWA score and treat/assess further if indicated by score or symptom development. -CIWA score one  time  Diet: Normal IVF: LR, already received 500 mL bolus VTE:  Xarelto Code: DNR/DNI  Dispo: Admit patient to Observation with expected length of stay less than 2 midnights.  Signed: Kayleen Memos, Medical Student 11/23/2022, 8:38 AM   Attestation for Student Documentation:  I personally was present and performed or re-performed the history, physical exam and medical decision-making activities of this service and have verified that the service and findings are accurately documented in the student's note.  Morrie Sheldon, MD 11/23/2022, 1:26 PM

## 2022-11-23 NOTE — ED Triage Notes (Signed)
Patient BIB EMS for evaluation of hypoglycemia.  Patient reports he felt "sick" this morning and vomited after taking medications.  Took his oral diabetic medications around "12:30 this morning" and noted to have low blood sugar.  C/o abdominal discomfort. Blood sugar for EMS was 56.  Pt alert and oriented on arrival.

## 2022-11-23 NOTE — Hospital Course (Addendum)
James Bradford is a 57 year old male with PMHx of T2DM, HTN, HLD, GERD, CKD stage III who was admitted to the [facility] Internal Medicine Service on 10/20-10/21/2024 for the treatment of AKI and hypoglycemia. He left on 10/21 AGAINST MEDICAL ADVICE.   Hypoglycemia On initial presentation, the patient reported decreased appetite over past 2-3 days, nausea, vomiting x1, lower abdominal pain for 1 day who was found to be hypoglycemic (27) in ED with AKI (Cr 2.04, BUN 31, GFR 38) and was admitted for hydration. Hypoglycemia is likely secondary to continued glipizide use despite low PO intake and decreased hydration. CT A/p and RUQ [place] were negative for acute findings.   Metformin and glipizide were held, given the severity of his hypoglycemia and his AKI.  Additionally, the patient had a recurrent hypoglycemic episode after getting only 2u of novolog with dinner. It was thought that he has more sensitivity to insulin (and less resistance) than expected for a type 2 diabetic, raising some concern for Latent Autoimmune Diabetes in Adults. Recommend testing for antibodies against glutamic acid decarboxylase, islet cell, and zinc-transporter 8 and could consider Glucagon challenge followed by C-peptide level. Patient left AMA prior to these labs being collected.   AKI Secondary to decreased PO intake and nausesa, vomiting. Cr 2.04 on admission but improved to 1.49 with IVF. Encouraged oral hydration and plan was to recheck BMP in the morning, however, patient left AMA. Would re-check BMP at hospital follow up to ensure kidney function has returned to normal.

## 2022-11-23 NOTE — ED Notes (Signed)
Patient provided cranberry juice

## 2022-11-23 NOTE — ED Notes (Signed)
ED TO INPATIENT HANDOFF REPORT  ED Nurse Name and Phone #: Dahlia Client [phone]  S Name/Age/Gender James Bradford 57 y.o. male Room/Bed: 030C/030C  Code Status   Code Status: Limited: Do not attempt resuscitation (DNR) -DNR-LIMITED -Do Not Intubate/DNI   Home/SNF/Other Home Patient oriented to: self, place, time, and situation Is this baseline? Yes   Triage Complete: Triage complete  Chief Complaint AKI (acute kidney injury) [facility]) [N17.9]  Triage Note Patient BIB EMS for evaluation of hypoglycemia.  Patient reports he felt "sick" this morning and vomited after taking medications.  Took his oral diabetic medications around "12:30 this morning" and noted to have low blood sugar.  C/o abdominal discomfort. Blood sugar for EMS was 56.  Pt alert and oriented on arrival.    Allergies Allergies  Allergen Reactions   Ibuprofen Other (See Comments) and Nausea And Vomiting    Stomach pain  Other Reaction(s) from [facility]: Abdominal pain, Nausea   Pork-Derived Products Other (See Comments)    religious   Asa [Aspirin] Other (See Comments)    Stomach pain    Level of Care/Admitting Diagnosis ED Disposition     ED Disposition  Admit   Condition  --   Comment  Hospital Area: [facility] [[id number]]  Level of Care: Med-Surg [16]  May place patient in observation at [facility] or [facility] if equivalent level of care is available:: No  Covid Evaluation: Asymptomatic - no recent exposure (last 10 days) testing not required  Diagnosis: AKI (acute kidney injury) [facility]) [[id number]]  Admitting Physician: Inez Catalina [phone]  Attending Physician: Nena Polio          B Medical/Surgery History Past Medical History:  Diagnosis Date   Aortic atherosclerosis (HCC) 08/24/2020   Hyperlipidemia    Hypertension    Marijuana abuse 09/28/2020   Sinus bradycardia    Stage 3 chronic kidney disease (HCC)    Type 2 diabetes mellitus (HCC)    Unspecified  glaucoma 02/18/2008   Qualifier: Diagnosis of  By: Daphine Deutscher FNP, Zena Amos     Past Surgical History:  Procedure Laterality Date   HAND SURGERY     LEG SURGERY       A IV Location/Drains/Wounds Patient Lines/Drains/Airways Status     Active Line/Drains/Airways     Name Placement date Placement time Site Days   Peripheral IV 11/23/22 20 G Left Antecubital 11/23/22  0300  Antecubital  less than 1            Intake/Output Last 24 hours  Intake/Output Summary (Last 24 hours) at 11/23/2022 1230 Last data filed at 11/23/2022 1213 Gross per 24 hour  Intake --  Output 650 ml  Net -650 ml    Labs/Imaging Results for orders placed or performed during the hospital encounter of 11/23/22 (from the past 48 hour(s))  CBG monitoring, ED     Status: Abnormal   Collection Time: 11/23/22  2:55 AM  Result Value Ref Range   Glucose-Capillary 27 (LL) 70 - 99 mg/dL    Comment: Glucose reference range applies only to samples taken after fasting for at least 8 hours.   Comment 1 Notify RN    Comment 2 Document in Chart   CBC with Differential     Status: Abnormal   Collection Time: 11/23/22  3:00 AM  Result Value Ref Range   WBC 4.4 4.0 - 10.5 K/uL   RBC 3.87 (L) 4.22 - 5.81 MIL/uL   Hemoglobin 12.2 (L) 13.0 -  17.0 g/dL   HCT 25.3 (L) 66.4 - 40.3 %   MCV 94.3 80.0 - 100.0 fL   MCH 31.5 26.0 - 34.0 pg   MCHC 33.4 30.0 - 36.0 g/dL   RDW 47.4 25.9 - 56.3 %   Platelets 326 150 - 400 K/uL   nRBC 0.0 0.0 - 0.2 %   Neutrophils Relative % 50 %   Neutro Abs 2.2 1.7 - 7.7 K/uL   Lymphocytes Relative 32 %   Lymphs Abs 1.4 0.7 - 4.0 K/uL   Monocytes Relative 14 %   Monocytes Absolute 0.6 0.1 - 1.0 K/uL   Eosinophils Relative 3 %   Eosinophils Absolute 0.1 0.0 - 0.5 K/uL   Basophils Relative 1 %   Basophils Absolute 0.1 0.0 - 0.1 K/uL   Immature Granulocytes 0 %   Abs Immature Granulocytes 0.01 0.00 - 0.07 K/uL    Comment: Performed at [facility], [address] [place],  [place] [address]  Comprehensive metabolic panel     Status: Abnormal   Collection Time: 11/23/22  3:00 AM  Result Value Ref Range   Sodium 138 135 - 145 mmol/L   Potassium 4.3 3.5 - 5.1 mmol/L   Chloride 104 98 - 111 mmol/L   CO2 20 (L) 22 - 32 mmol/L   Glucose, Bld 45 (L) 70 - 99 mg/dL    Comment: Glucose reference range applies only to samples taken after fasting for at least 8 hours.   BUN 31 (H) 6 - 20 mg/dL   Creatinine, Ser 3.32 (H) 0.61 - 1.24 mg/dL   Calcium 9.7 8.9 - 95.1 mg/dL   Total Protein 7.7 6.5 - 8.1 g/dL   Albumin 4.5 3.5 - 5.0 g/dL   AST 28 15 - 41 U/L   ALT 22 0 - 44 U/L   Alkaline Phosphatase 70 38 - 126 U/L   Total Bilirubin 0.4 0.3 - 1.2 mg/dL   GFR, Estimated 38 (L) >60 mL/min    Comment: (NOTE) Calculated using the CKD-EPI Creatinine Equation (2021)    Anion gap 14 5 - 15    Comment: Performed at [facility], [address], [place], [place] [address]  Lipase, blood     Status: None   Collection Time: 11/23/22  3:00 AM  Result Value Ref Range   Lipase 42 11 - 51 U/L    Comment: Performed at [facility], [address] [place], [place] [address]  CBG monitoring, ED     Status: Abnormal   Collection Time: 11/23/22  3:56 AM  Result Value Ref Range   Glucose-Capillary 50 (L) 70 - 99 mg/dL    Comment: Glucose reference range applies only to samples taken after fasting for at least 8 hours.  Urinalysis, Routine w reflex microscopic -Urine, Clean Catch     Status: Abnormal   Collection Time: 11/23/22  4:06 AM  Result Value Ref Range   Color, Urine YELLOW YELLOW   APPearance HAZY (A) CLEAR   Specific Gravity, Urine 1.008 1.005 - 1.030   pH 5.0 5.0 - 8.0   Glucose, UA NEGATIVE NEGATIVE mg/dL   Hgb urine dipstick NEGATIVE NEGATIVE   Bilirubin Urine NEGATIVE NEGATIVE   Ketones, ur NEGATIVE NEGATIVE mg/dL   Protein, ur NEGATIVE NEGATIVE mg/dL   Nitrite NEGATIVE NEGATIVE   Leukocytes,Ua NEGATIVE NEGATIVE    Comment: Performed at [facility]  Lab, [address] [place], [place] [address]  POC CBG, ED  Status: Abnormal   Collection Time: 11/23/22  6:32 AM  Result Value Ref Range   Glucose-Capillary 116 (H) 70 - 99 mg/dL    Comment: Glucose reference range applies only to samples taken after fasting for at least 8 hours.  CBG monitoring, ED     Status: None   Collection Time: 11/23/22  7:26 AM  Result Value Ref Range   Glucose-Capillary 78 70 - 99 mg/dL    Comment: Glucose reference range applies only to samples taken after fasting for at least 8 hours.  CBG monitoring, ED     Status: None   Collection Time: 11/23/22 10:28 AM  Result Value Ref Range   Glucose-Capillary 80 70 - 99 mg/dL    Comment: Glucose reference range applies only to samples taken after fasting for at least 8 hours.   CT ABDOMEN PELVIS W CONTRAST  Result Date: 11/23/2022 CLINICAL DATA:  Acute, nonlocalized abdominal pain EXAM: CT ABDOMEN AND PELVIS WITH CONTRAST TECHNIQUE: Multidetector CT imaging of the abdomen and pelvis was performed using the standard protocol following bolus administration of intravenous contrast. RADIATION DOSE REDUCTION: This exam was performed according to the departmental dose-optimization program which includes automated exposure control, adjustment of the mA and/or kV according to patient size and/or use of iterative reconstruction technique. CONTRAST:  75mL OMNIPAQUE IOHEXOL 350 MG/ML SOLN COMPARISON:  03/18/2022 FINDINGS: Lower chest:  No acute finding Hepatobiliary: No focal liver abnormality.No evidence of biliary obstruction or stone. Pancreas: Unremarkable. Spleen: Unremarkable. Adrenals/Urinary Tract: Negative adrenals. No hydronephrosis or stone. Small bilateral renal cystic densities. No superimposed complexity, no follow-up recommended. Unremarkable bladder. Stomach/Bowel:  No obstruction. No visible bowel inflammation. Vascular/Lymphatic: No acute vascular abnormality. No mass or adenopathy. Reproductive:No pathologic findings.  Other: No ascites or pneumoperitoneum. Musculoskeletal: No acute abnormalities. IMPRESSION: No acute finding.  No bowel obstruction or visible inflammation. Electronically Signed   By: Tiburcio Pea M.D.   On: 11/23/2022 07:01   US Abdomen Limited RUQ (LIVER/GB)  Result Date: 11/23/2022 CLINICAL DATA:  57 year old male with history of right upper quadrant abdominal pain. Hypoglycemia. EXAM: ULTRASOUND ABDOMEN LIMITED RIGHT UPPER QUADRANT COMPARISON:  None Available. FINDINGS: Gallbladder: No gallstones or wall thickening visualized. No sonographic Murphy sign noted by sonographer. Common bile duct: Diameter: 3.6 mm Liver: No focal lesion identified. Within normal limits in parenchymal echogenicity. Portal vein is patent on color Doppler imaging with normal direction of blood flow towards the liver. Other: None. IMPRESSION: 1. No acute findings to account for the patient's symptoms. Specifically, no evidence of cholelithiasis and no findings to suggest an acute cholecystitis are noted at this time. Electronically Signed   By: Trudie Reed M.D.   On: 11/23/2022 05:42    Pending Labs Unresulted Labs (From admission, onward)     Start     Ordered   11/24/22 0500  Basic metabolic panel  Tomorrow morning,   R        11/23/22 1225   11/24/22 0500  CBC  Tomorrow morning,   R        11/23/22 1225   11/23/22 1225  HIV Antibody (routine testing w rflx)  (HIV Antibody (Routine testing w reflex) panel)  Add-on,   AD        11/23/22 1225            Vitals/Pain Today's Vitals   11/23/22 0745 11/23/22 0800 11/23/22 1100 11/23/22 1214  BP: 113/77 125/80 119/72   Pulse: 85 86 83   Resp: 17 13 20  Temp:   98.7 F (37.1 C)   TempSrc:   Oral   SpO2: 100% 100% 100%   Weight:      Height:      PainSc:    0-No pain    Isolation Precautions No active isolations  Medications Medications  rivaroxaban (XARELTO) tablet 10 mg (has no administration in time range)  acetaminophen (TYLENOL) tablet  650 mg (has no administration in time range)    Or  acetaminophen (TYLENOL) suppository 650 mg (has no administration in time range)  senna-docusate (Senokot-S) tablet 1 tablet (has no administration in time range)  pravastatin (PRAVACHOL) tablet 10 mg (has no administration in time range)  pantoprazole (PROTONIX) EC tablet 40 mg (has no administration in time range)  ondansetron (ZOFRAN) injection 4 mg (has no administration in time range)  dextrose 50 % solution 25 mL (25 mLs Intravenous Given 11/23/22 0410)  dextrose 50 % solution 25 mL (25 mLs Intravenous Given 11/23/22 0307)  iohexol (OMNIPAQUE) 350 MG/ML injection 75 mL (75 mLs Intravenous Contrast Given 11/23/22 0550)  sodium chloride 0.9 % bolus 500 mL (0 mLs Intravenous Stopped 11/23/22 0914)    Mobility walks     Focused Assessments     R Recommendations: See Admitting Provider Note  Report given to:   Additional Notes:

## 2022-11-24 ENCOUNTER — Other Ambulatory Visit (HOSPITAL_COMMUNITY): Payer: Self-pay

## 2022-11-24 DIAGNOSIS — N179 Acute kidney failure, unspecified: Secondary | ICD-10-CM | POA: Diagnosis not present

## 2022-11-24 LAB — BASIC METABOLIC PANEL
Anion gap: 8 (ref 5–15)
BUN: 21 mg/dL — ABNORMAL HIGH (ref 6–20)
CO2: 22 mmol/L (ref 22–32)
Calcium: 9.1 mg/dL (ref 8.9–10.3)
Chloride: 103 mmol/L (ref 98–111)
Creatinine, Ser: 1.49 mg/dL — ABNORMAL HIGH (ref 0.61–1.24)
GFR, Estimated: 55 mL/min — ABNORMAL LOW (ref >60)
Glucose, Bld: 250 mg/dL — ABNORMAL HIGH (ref 70–99)
Potassium: 5.1 mmol/L (ref 3.5–5.1)
Sodium: 133 mmol/L — ABNORMAL LOW (ref 135–145)

## 2022-11-24 LAB — GLUCOSE, CAPILLARY
Glucose-Capillary: 208 mg/dL — ABNORMAL HIGH (ref 70–99)
Glucose-Capillary: 134 mg/dL — ABNORMAL HIGH (ref 70–99)
Glucose-Capillary: 148 mg/dL — ABNORMAL HIGH (ref 70–99)
Glucose-Capillary: 213 mg/dL — ABNORMAL HIGH (ref 70–99)
Glucose-Capillary: 242 mg/dL — ABNORMAL HIGH (ref 70–99)
Glucose-Capillary: 227 mg/dL — ABNORMAL HIGH (ref 70–99)

## 2022-11-24 LAB — CBC
HCT: 34.4 % — ABNORMAL LOW (ref 39.0–52.0)
Hemoglobin: 11.6 g/dL — ABNORMAL LOW (ref 13.0–17.0)
MCH: 31.7 pg (ref 26.0–34.0)
MCHC: 33.7 g/dL (ref 30.0–36.0)
MCV: 94 fL (ref 80.0–100.0)
Platelets: 295 10*3/uL (ref 150–400)
RBC: 3.66 MIL/uL — ABNORMAL LOW (ref 4.22–5.81)
RDW: 13.7 % (ref 11.5–15.5)
WBC: 4.2 10*3/uL (ref 4.0–10.5)
nRBC: 0 % (ref 0.0–0.2)

## 2022-11-24 LAB — HIV ANTIBODY (ROUTINE TESTING W REFLEX): HIV Screen 4th Generation wRfx: NONREACTIVE

## 2022-11-24 MED ORDER — MELATONIN 5 MG PO TABS: 5.0000 mg | ORAL_TABLET | Freq: Every day | ORAL | Status: DC

## 2022-11-24 MED ORDER — MELATONIN 5 MG PO TABS: 5.0000 mg | ORAL_TABLET | Freq: Every evening | ORAL | Status: DC | PRN

## 2022-11-24 NOTE — Discharge Summary (Signed)
Name: James Bradford MRN: [id number] DOB: Oct 11, 1965 57 y.o. PCP: Medicine, [facility]  Date of Admission: 11/23/2022  2:53 AM Date of AMA Discharge:  11/24/2022 Attending Physician: Dr. Criselda Peaches  DISCHARGE DIAGNOSIS:  Primary Problem: AKI (acute kidney injury) [facility])   Hospital Problems: Principal Problem:   AKI (acute kidney injury) (HCC) Active Problems:   Acute kidney injury superimposed on stage 3a chronic kidney disease (HCC)   Hypoglycemia associated with type 2 diabetes mellitus (HCC)    DISCHARGE MEDICATIONS:   Allergies as of 11/24/2022       Reactions   Ibuprofen Other (See Comments), Nausea And Vomiting   Stomach pain Other Reaction(s) from [facility]: Abdominal pain, Nausea   Pork-derived Products Other (See Comments)   religious   Asa [aspirin] Other (See Comments)   Stomach pain        Medication List     STOP taking these medications    glipiZIDE 10 MG tablet Commonly known as: GLUCOTROL   metFORMIN 500 MG tablet Commonly known as: GLUCOPHAGE       TAKE these medications    KRILL OIL PO Take 1 tablet by mouth daily.   lisinopril 10 MG tablet Commonly known as: ZESTRIL Take 10 mg by mouth at bedtime.   Mens 50+ Multivitamin Tabs Take 1 tablet by mouth daily.   pantoprazole 40 MG tablet Commonly known as: PROTONIX Take 1 tablet (40 mg total) by mouth 2 (two) times daily before a meal.   pravastatin 10 MG tablet Commonly known as: PRAVACHOL Take 1 tablet (10 mg total) by mouth daily.        DISPOSITION AND FOLLOW-UP:  James Bradford LEFT AGAINST MEDICAL ADVICE from [facility] in [place] condition. At the hospital follow up visit please address:  Hypoglycemia Advised to hold glipizide and metformin given his episodes of hypoglycemia and his AKI  Follow up with PCP regarding medication adjustment and possible workup for LADA AKI Recheck BMP at hospital follow up  Follow-up  Recommendations: Consults: none Labs:  c peptide, anti-islet cell antibody, ZNT8 antibodies, glutamic acid decarboyxlase, BMP Studies: none New Medications: none  Follow-up Appointments:  Follow-up Information     Medicine, [facility] Follow up.   Specialty: Family Medicine Contact information: [address] [place] [address] [phone]                 HOSPITAL COURSE:  Patient Summary: James Bradford is a 56 year old male with PMHx of T2DM, HTN, HLD, GERD, CKD stage III who was admitted to the [facility] Internal Medicine Service on 10/20-10/21/2024 for the treatment of AKI and hypoglycemia. He left on 10/21 AGAINST MEDICAL ADVICE.   Hypoglycemia On initial presentation, the patient reported decreased appetite over past 2-3 days, nausea, vomiting x1, lower abdominal pain for 1 day who was found to be hypoglycemic (27) in ED with AKI (Cr 2.04, BUN 31, GFR 38) and was admitted for hydration. Hypoglycemia is likely secondary to continued glipizide use despite low PO intake and decreased hydration. CT A/p and RUQ [place] were negative for acute findings.   Metformin and glipizide were held, given the severity of his hypoglycemia and his AKI.  Additionally, the patient had a recurrent hypoglycemic episode after getting only 2u of novolog with dinner. It was thought that he has more sensitivity to insulin (and less resistance) than expected for a type 2 diabetic, raising some concern for Latent Autoimmune Diabetes in Adults. Recommend testing for  antibodies against glutamic acid decarboxylase, islet cell, and zinc-transporter 8 and could consider Glucagon challenge followed by C-peptide level. Patient left AMA prior to these labs being collected.   AKI Secondary to decreased PO intake and nausesa, vomiting. Cr 2.04 on admission but improved to 1.49 with IVF. Encouraged oral hydration and plan was to recheck BMP in the morning, however, patient left AMA. Would re-check BMP at  hospital follow up to ensure kidney function has returned to normal.    DISCHARGE INSTRUCTIONS:  None provided- patient left AMA.   SUBJECTIVE:  Patient was seen prior to leaving AMA. He expressed frustration with having to stay one more night in the hospital, despite being counseled on his abnormal kidney function.  Discharge Vitals:   BP 122/84 (BP Location: Right Arm)   Pulse 91   Temp 99 F (37.2 C) (Oral)   Resp 15   Ht 5\' 7"  (1.702 m)   Wt 68 kg   SpO2 100%   BMI 23.49 kg/m   OBJECTIVE:     Pertinent Labs, Studies, and Procedures:     Latest Ref Rng & Units 11/24/2022    8:53 AM 11/23/2022    3:00 AM 09/05/2022   11:12 PM  CBC  WBC 4.0 - 10.5 K/uL 4.2  4.4  6.9   Hemoglobin 13.0 - 17.0 g/dL 29.5  62.1  30.8   Hematocrit 39.0 - 52.0 % 34.4  36.5  38.8   Platelets 150 - 400 K/uL 295  326  300        Latest Ref Rng & Units 11/24/2022    8:53 AM 11/23/2022    8:29 PM 11/23/2022    3:00 AM  CMP  Glucose 70 - 99 mg/dL 657  846  45   BUN 6 - 20 mg/dL 21   31   Creatinine 9.62 - 1.24 mg/dL 9.52   8.41   Sodium 324 - 145 mmol/L 133   138   Potassium 3.5 - 5.1 mmol/L 5.1   4.3   Chloride 98 - 111 mmol/L 103   104   CO2 22 - 32 mmol/L 22   20   Calcium 8.9 - 10.3 mg/dL 9.1   9.7   Total Protein 6.5 - 8.1 g/dL   7.7   Total Bilirubin 0.3 - 1.2 mg/dL   0.4   Alkaline Phos 38 - 126 U/L   70   AST 15 - 41 U/L   28   ALT 0 - 44 U/L   22     CT ABDOMEN PELVIS W CONTRAST  Result Date: 11/23/2022 CLINICAL DATA:  Acute, nonlocalized abdominal pain EXAM: CT ABDOMEN AND PELVIS WITH CONTRAST TECHNIQUE: Multidetector CT imaging of the abdomen and pelvis was performed using the standard protocol following bolus administration of intravenous contrast. RADIATION DOSE REDUCTION: This exam was performed according to the departmental dose-optimization program which includes automated exposure control, adjustment of the mA and/or kV according to patient size and/or use of iterative  reconstruction technique. CONTRAST:  75mL OMNIPAQUE IOHEXOL 350 MG/ML SOLN COMPARISON:  03/18/2022 FINDINGS: Lower chest:  No acute finding Hepatobiliary: No focal liver abnormality.No evidence of biliary obstruction or stone. Pancreas: Unremarkable. Spleen: Unremarkable. Adrenals/Urinary Tract: Negative adrenals. No hydronephrosis or stone. Small bilateral renal cystic densities. No superimposed complexity, no follow-up recommended. Unremarkable bladder. Stomach/Bowel:  No obstruction. No visible bowel inflammation. Vascular/Lymphatic: No acute vascular abnormality. No mass or adenopathy. Reproductive:No pathologic findings. Other: No ascites or pneumoperitoneum. Musculoskeletal: No acute abnormalities. IMPRESSION: No  acute finding.  No bowel obstruction or visible inflammation. Electronically Signed   By: Tiburcio Pea M.D.   On: 11/23/2022 07:01   US Abdomen Limited RUQ (LIVER/GB)  Result Date: 11/23/2022 CLINICAL DATA:  57 year old male with history of right upper quadrant abdominal pain. Hypoglycemia. EXAM: ULTRASOUND ABDOMEN LIMITED RIGHT UPPER QUADRANT COMPARISON:  None Available. FINDINGS: Gallbladder: No gallstones or wall thickening visualized. No sonographic Murphy sign noted by sonographer. Common bile duct: Diameter: 3.6 mm Liver: No focal lesion identified. Within normal limits in parenchymal echogenicity. Portal vein is patent on color Doppler imaging with normal direction of blood flow towards the liver. Other: None. IMPRESSION: 1. No acute findings to account for the patient's symptoms. Specifically, no evidence of cholelithiasis and no findings to suggest an acute cholecystitis are noted at this time. Electronically Signed   By: Trudie Reed M.D.   On: 11/23/2022 05:42     Signed: Elza Rafter, D.O.  Internal Medicine Resident, PGY-3 [facility]  Pager: [phone] 9:30 PM, 11/24/2022

## 2022-11-24 NOTE — TOC Benefit Eligibility Note (Signed)
Patient Product/process development scientist completed.    The patient is insured through [facility]. Patient has [employer], may use a copay card, and/or apply for patient assistance if available.    Ran test claim for Farixga 10 mg and the current 30 day co-pay is $0.00.  Ran test claim for Jardiance 10 mg and the current 30 day co-pay is $0.00.  This test claim was processed through [facility]- copay amounts may vary at other pharmacies due to pharmacy/plan contracts, or as the patient moves through the different stages of their insurance plan.     Roland Earl, CPHT Pharmacy Technician III Certified Patient Advocate [facility] Patient Advocate Team Direct Number: [phone]  Fax: [phone]

## 2022-11-24 NOTE — Progress Notes (Signed)
Patient voiced concerns to nursing staff about care. Patient stated "no one has done anything about my sugar or dehydration. I can lay in the bed for free at home." MD at the bedside to explain POC. Patient became upset, yelling at staff while packing belongings.RN removed PIV, as requested. AMA paperwork brought to bedside. Patient refused stating "I'm not signing shit" and left the unit with belongings in hand.

## 2022-11-24 NOTE — Inpatient Diabetes Management (Signed)
Inpatient Diabetes Program Recommendations  AACE/ADA: New Consensus Statement on Inpatient Glycemic Control (2015)  Target Ranges:  Prepandial:   less than 140 mg/dL      Peak postprandial:   less than 180 mg/dL (1-2 hours)      Critically ill patients:  140 - 180 mg/dL   Lab Results  Component Value Date   GLUCAP 148 (H) 11/24/2022   HGBA1C 7.8 (H) 09/02/2022    Review of Glycemic Control  Latest Reference Range & Units 11/23/22 21:12 11/24/22 01:04 11/24/22 06:06 11/24/22 07:44  Glucose-Capillary 70 - 99 mg/dL 161 (H) 096 (H) 045 (H) 148 (H)   Diabetes history: DM 2 Outpatient Diabetes medications:  Glipizide 10 mg bid Metformin 500 mg bid Current orders for Inpatient glycemic control:  None Inpatient Diabetes Program Recommendations:    Admitted with low blood sugars.  States sensor fell off?  Last d/c summary states for patient to stop taking Glipizide however it is on his "Home medication reconciliation".   Addendum 1500- Spoke with patient at bedside.  He states that he has Dexcom G6 but recently lost the receiver and needs to wait at least a week to get a new one (renew every 3 months).   Asked patient if he was taking Glipizide and he states "yes".  Explained that this can cause low blood sugars and that MD may d/c to prevent lows/hypoglycemia.  May be able to provide a Dexcom G7 sample at discharge if okay with MD.  Will follow.   Thanks , Beryl Meager, RN, BC-ADM Inpatient Diabetes Coordinator Pager [phone]  (8a-5p)

## 2022-11-24 NOTE — Plan of Care (Signed)
Problem: Education: Goal: Knowledge of General Education information will improve Description: Including pain rating scale, medication(s)/side effects and non-pharmacologic comfort measures Outcome: Progressing Pt understands he was admitted into the hospital for vomiting and hypoglycemia.  He understands that per MD's orders pt's CBGs are to be checked q4 hours.    Problem: Clinical Measurements: Goal: Will remain free from infection Outcome: Progressing S/Sx of infection monitored and assessed q-shift.  Pt has remained afebrile thus far.       Problem: Clinical Measurements: Goal: Respiratory complications will improve Outcome: Progressing Respiratory status monitored and assessed q-shift.  Pt is on room air with PO2 at 100% and respiration rate of 16-18 breaths per minute.  Pt has not endorsed c/o SOB or DOE.    Problem: Clinical Measurements: Goal: Cardiovascular complication will be avoided Outcome: Progressing Pt's VS WNL.     Problem: Activity: Goal: Risk for activity intolerance will decrease Outcome: Progressing Pt is independent of all his ADLs.  He can get up OOB w/o the assistance of RN staff.   Problem: Nutrition: Goal: Adequate nutrition will be maintained Outcome: Progressing Pt is on a carb modified diet per MD's orders and can tolerate it w/o s/sx of abdominal pain/ distention or n/v.     Problem: Safety: Goal: Ability to remain free from injury will improve Outcome: Progressing Pt has remained free from falls thus far.  Instructed pt to utilize RN call light for assistance.  Hourly rounds performed. Bed in lowest position, locked with two upper side rails engaged.  Belongings and call light within reach.    Problem: Skin Integrity: Goal: Risk for impaired skin integrity will decrease Outcome: Progressing Skin integrity monitored and assessed q-shift.  Instructed pt to q2 hours to prevent further skin impairment.  Tubes and drains assessed for device related  pressures sores.  Pt is continent of both bowel and bladder.     Problem: Education: Goal: Ability to describe self-care measures that may prevent or decrease complications (Diabetes Survival Skills Education) will improve Outcome: Progressing Pt has a history of diabetes.  Per MD's orders pt's CBGs are to be checked q4 hours.    Problem: Pain Managment: Goal: General experience of comfort will improve Outcome: Progressing Pt has endorsed 5/10 RUE pain describing it as an occasional aching discomfort.  Reiterated pain scale so he caould adequately rate his pain.  Pt stated his pain goal this admission would be 0/10.  Discussed nonpharmacological methods to help reduce s/sx of pain.  Interventions given per pt's request and MD's orders.

## 2022-11-24 NOTE — Progress Notes (Signed)
Subjective:  Damontae says that he is doing well, but had a hypoglycemic episode last night after getting insulin for his meal. He felt diaphoretic and alerted his nurse when this happened. He states that he has not had any difficulty with access to food or obtaining food in the past year.  Acute Overnight Events: Patient called out to nurse after feeling diaphoretic and hypoglycemic around 8 pm. BG was around 30. Received Apple juice x3, repeat BG was around the same. Received OJ and D50, repeat BG at 9 pm was 232.  Objective:  Vital signs in last 24 hours: Vitals:   11/23/22 1304 11/23/22 1549 11/23/22 2008 11/24/22 0104  BP: (!) 149/92 (!) 131/100 (!) 137/92 128/83  Pulse: 87 96 68 77  Resp: 18 17 18 18   Temp: 98.8 F (37.1 C) 99.5 F (37.5 C) 97.7 F (36.5 C) 98.3 F (36.8 C)  TempSrc: Oral Oral Oral   SpO2: 98% 100% 100% 100%  Weight:      Height:       Weight change:   Intake/Output Summary (Last 24 hours) at 11/24/2022 0701 Last data filed at 11/24/2022 5409 Gross per 24 hour  Intake --  Output 2050 ml  Net -2050 ml   Physical Exam Constitutional:      Appearance: Normal appearance. He is normal weight.  Cardiovascular:     Rate and Rhythm: Normal rate and regular rhythm.  Pulmonary:     Effort: Pulmonary effort is normal.     Breath sounds: Normal breath sounds.  Abdominal:     General: Abdomen is flat.     Palpations: Abdomen is soft.     Tenderness: There is no abdominal tenderness.  Musculoskeletal:     Cervical back: Normal range of motion.  Skin:    General: Skin is warm and dry.  Neurological:     Mental Status: He is alert. Mental status is at baseline.  Psychiatric:        Mood and Affect: Mood normal.        Behavior: Behavior normal.    Assessment/Plan:  Principal Problem:   AKI (acute kidney injury) (HCC) Active Problems:   Acute kidney injury superimposed on stage 3a chronic kidney disease (HCC)   Hypoglycemia associated with type 2  diabetes mellitus (HCC)   Summary: James Bradford is a 57 year old male with PMHx of T2DM, HTN, HLD, GERD, CKD stage III who presents with decreased appetite over past 2-3 days, nausea, vomiting x1, lower abdominal pain for 1 day who was found to be hypoglycemic in ED with AKI and was admitted for hydration.   #AKI secondary to decrease PO intake  Nausea  Vomiting Current BUN 31, Cr of 2.04 in the setting of decreased PO intake, nausea, and vomiting.  10/21: Morning BMP shows Cr improved to 1.49. Will encourage oral hydration due to IV fluid shortage. Patient received ~1L of LR last night after hypoglycemic episode. -Encourage PO intake -Zofran q6h PRN to assist PO intake -AM CBC, BMP   #Hypoglycemia x2 CBG of 27 on arrival. Overnight patient had CBG as low as 29 requiring juices and d50. He received LR 1L over 12 hours. Glucose this morning at 6 am was 134. Will encourage plenty of eating. Obtained a fecal elastase to evaluate for pancreatic exocrine function. Considering that his recurrent hypoglycemia was driven by only 2 units of novolog with dinner, it is possible that he has more sensitivity (and less resistance) to insulin than expected for a  Type 2 Diabetic. This raises consideration for Latent Autoimmune Diabetes in Adults. He was diagnosed around 57 years old. ADA recommends testing for antibodies against glutamic acid decarboxylase, islet cell, and zinc-transporter 8 and could consider Glucagon challenge followed by C-peptide level. Will defer glucagon testing and send for antibodies. -Anti-GAD, anti-IA-2, and anti-ZnT8 antibodies -Fecal elastase lab -AM BMP -Encouraged PO intake w/ zofran for nausea -Continue to monitor with CBG via hypoglycemia protocol   #History of Alcohol Abuse Patient reports that he only drinks about once a month. Prior hospital note on 03/21/2022 Note reads 'Initially, patient stated to admitting MD that he "only drinks socially' but then when asked again he reports  drinking a pint of vodka daily in the days preceding his illness." Since patient is here in the setting of decreased appetite, confusion, nausea and vomiting yesterday the patient could be in withdrawal. Will obtain CIWA score and treat/assess further if indicated by score or symptom development. 10/21: CIWA score not reported. Will obtain today. -CIWA score one time  Diet: Normal IVF: None,None VTE:  Rivaroxaban Code: DNR/DNI PT/OT recs: None, none.   LOS: 0 days   Kayleen Memos, Medical Student 11/24/2022, 7:01 AM

## 2022-11-26 ENCOUNTER — Emergency Department (HOSPITAL_COMMUNITY)
Admission: EM | Admit: 2022-11-26 | Discharge: 2022-11-26 | Disposition: A | Discharge status: Home / Self Care | Payer: BLUE CROSS/BLUE SHIELD | Attending: Emergency Medicine | Admitting: Emergency Medicine

## 2022-11-26 ENCOUNTER — Encounter (HOSPITAL_COMMUNITY): Payer: Self-pay | Admitting: Emergency Medicine

## 2022-11-26 ENCOUNTER — Other Ambulatory Visit: Payer: Self-pay

## 2022-11-26 DIAGNOSIS — I129 Hypertensive chronic kidney disease with stage 1 through stage 4 chronic kidney disease, or unspecified chronic kidney disease: Secondary | ICD-10-CM | POA: Insufficient documentation

## 2022-11-26 DIAGNOSIS — E119 Type 2 diabetes mellitus without complications: Secondary | ICD-10-CM

## 2022-11-26 DIAGNOSIS — Z79899 Other long term (current) drug therapy: Secondary | ICD-10-CM | POA: Insufficient documentation

## 2022-11-26 DIAGNOSIS — R519 Headache, unspecified: Secondary | ICD-10-CM | POA: Diagnosis present

## 2022-11-26 DIAGNOSIS — E11649 Type 2 diabetes mellitus with hypoglycemia without coma: Secondary | ICD-10-CM | POA: Diagnosis not present

## 2022-11-26 DIAGNOSIS — N183 Chronic kidney disease, stage 3 unspecified: Secondary | ICD-10-CM | POA: Insufficient documentation

## 2022-11-26 DIAGNOSIS — E162 Hypoglycemia, unspecified: Secondary | ICD-10-CM

## 2022-11-26 LAB — CBC WITH DIFFERENTIAL/PLATELET
Abs Immature Granulocytes: 0.03 10*3/uL (ref 0.00–0.07)
Basophils Absolute: 0 10*3/uL (ref 0.0–0.1)
Basophils Relative: 1 %
Eosinophils Absolute: 0.1 10*3/uL (ref 0.0–0.5)
Eosinophils Relative: 1 %
HCT: 40.1 % (ref 39.0–52.0)
Hemoglobin: 13.1 g/dL (ref 13.0–17.0)
Immature Granulocytes: 1 %
Lymphocytes Relative: 28 %
Lymphs Abs: 1.6 10*3/uL (ref 0.7–4.0)
MCH: 32.3 pg (ref 26.0–34.0)
MCHC: 32.7 g/dL (ref 30.0–36.0)
MCV: 98.8 fL (ref 80.0–100.0)
Monocytes Absolute: 0.5 10*3/uL (ref 0.1–1.0)
Monocytes Relative: 8 %
Neutro Abs: 3.5 10*3/uL (ref 1.7–7.7)
Neutrophils Relative %: 61 %
Platelets: 319 10*3/uL (ref 150–400)
RBC: 4.06 MIL/uL — ABNORMAL LOW (ref 4.22–5.81)
RDW: 13.5 % (ref 11.5–15.5)
WBC: 5.8 10*3/uL (ref 4.0–10.5)
nRBC: 0 % (ref 0.0–0.2)

## 2022-11-26 LAB — BASIC METABOLIC PANEL
Anion gap: 16 — ABNORMAL HIGH (ref 5–15)
BUN: 21 mg/dL — ABNORMAL HIGH (ref 6–20)
CO2: 19 mmol/L — ABNORMAL LOW (ref 22–32)
Calcium: 10 mg/dL (ref 8.9–10.3)
Chloride: 101 mmol/L (ref 98–111)
Creatinine, Ser: 1.42 mg/dL — ABNORMAL HIGH (ref 0.61–1.24)
GFR, Estimated: 58 mL/min — ABNORMAL LOW (ref >60)
Glucose, Bld: 48 mg/dL — ABNORMAL LOW (ref 70–99)
Potassium: 4.4 mmol/L (ref 3.5–5.1)
Sodium: 136 mmol/L (ref 135–145)

## 2022-11-26 LAB — URINALYSIS, ROUTINE W REFLEX MICROSCOPIC
Bilirubin Urine: NEGATIVE
Glucose, UA: 150 mg/dL — AB
Hgb urine dipstick: NEGATIVE
Ketones, ur: NEGATIVE mg/dL
Leukocytes,Ua: NEGATIVE
Nitrite: NEGATIVE
Protein, ur: NEGATIVE mg/dL
Specific Gravity, Urine: 1.006 (ref 1.005–1.030)
pH: 5 (ref 5.0–8.0)

## 2022-11-26 LAB — CBG MONITORING, ED
Glucose-Capillary: 43 mg/dL — CL (ref 70–99)
Glucose-Capillary: 223 mg/dL — ABNORMAL HIGH (ref 70–99)
Glucose-Capillary: 150 mg/dL — ABNORMAL HIGH (ref 70–99)

## 2022-11-26 MED ORDER — SODIUM CHLORIDE 0.9% FLUSH: 3.0000 mL | Freq: Two times a day (BID) | INTRAVENOUS | Status: DC

## 2022-11-26 MED ORDER — SODIUM CHLORIDE 0.9% FLUSH: 3.0000 mL | INTRAVENOUS | Status: DC | PRN

## 2022-11-26 MED ORDER — ONDANSETRON HCL 4 MG/2ML IJ SOLN
4.0000 mg | Freq: Once | INTRAMUSCULAR | Status: AC
  Administered 2022-11-26: 4 mg via INTRAVENOUS
  Filled 2022-11-26: qty 2

## 2022-11-26 MED ORDER — SODIUM CHLORIDE 0.9% FLUSH: 10.0000 mL | Freq: Two times a day (BID) | INTRAVENOUS | Status: DC

## 2022-11-26 MED ORDER — DEXTROSE 50 % IV SOLN
1.0000 | Freq: Once | INTRAVENOUS | Status: AC
  Administered 2022-11-26: 50 mL via INTRAVENOUS
  Filled 2022-11-26: qty 50

## 2022-11-26 NOTE — ED Triage Notes (Signed)
Pt BIBA c/o feeling generally unwell, pt reports poor P.O. intake d/t poor appetite. EMS CBG 57 prior to arrival. Pt was seen at [facility] ED yesterday for the same complaint. Pt ambulatory, A&Ox4.

## 2022-11-26 NOTE — ED Notes (Signed)
Attempted to obtain BS recheck but Pt's hands were cold and not giving blood. Warm compresses provided.

## 2022-11-26 NOTE — ED Provider Notes (Signed)
[name] Provider Note   CSN: [id number] Arrival date & time: 11/26/22  0308     History  Chief Complaint  Patient presents with   Hypoglycemia    James Bradford is a 57 y.o. male with past medical history significant for type 2 diabetes, hypertension, stage III CKD, marijuana abuse, hyperlipidemia presents to the ED feeling generally unwell.  Patient reports poor P.O. intake due to poor appetite.  EMS CBG 57 prior to arrival.  Patient was admitted to hospital 3 days prior for hypoglycemia and AKI.  He is currently complaining of a headache.  Patient denies changes to his medications and has been taking them as prescribed.  Denies fever, chills, vomiting, diarrhea, abdominal pain, dizziness, light-headedness, syncope.          Home Medications Prior to Admission medications   Medication Sig Start Date End Date Taking? Authorizing Provider  KRILL OIL PO Take 1 tablet by mouth daily.   Yes [provider]  lisinopril (ZESTRIL) 10 MG tablet Take 10 mg by mouth at bedtime.   Yes [provider]  Multiple Vitamins-Minerals (MENS 50+ MULTIVITAMIN) TABS Take 1 tablet by mouth daily.   Yes [provider]  pantoprazole (PROTONIX) 40 MG tablet Take 1 tablet (40 mg total) by mouth 2 (two) times daily before a meal. 09/05/22  Yes Kc, Ramesh, MD  pravastatin (PRAVACHOL) 10 MG tablet Take 1 tablet (10 mg total) by mouth daily. 03/22/22  Yes Rodolph Bong, MD      Allergies    Ibuprofen, Pork-derived products, and Asa [aspirin]    Review of Systems   Review of Systems  Constitutional:  Negative for chills and fever.  Gastrointestinal:  Positive for nausea. Negative for abdominal pain, diarrhea and vomiting.  Neurological:  Negative for dizziness, syncope and light-headedness.    Physical Exam Updated Vital Signs BP 121/80   Pulse 85   Temp 97.7 F (36.5 C) (Oral)   Resp 14   Ht 5\' 7"  (1.702 m)   Wt 68 kg    SpO2 99%   BMI 23.48 kg/m  Physical Exam Vitals and nursing note reviewed.  Constitutional:      General: He is not in acute distress.    Appearance: Normal appearance. He is not ill-appearing or diaphoretic.  Eyes:     Extraocular Movements: Extraocular movements intact.     Pupils: Pupils are equal, round, and reactive to light.  Cardiovascular:     Rate and Rhythm: Normal rate and regular rhythm.     Heart sounds: Normal heart sounds.  Pulmonary:     Effort: Pulmonary effort is normal. No tachypnea or respiratory distress.     Breath sounds: Normal breath sounds and air entry.  Abdominal:     General: Abdomen is flat. Bowel sounds are normal.     Palpations: Abdomen is soft.     Tenderness: There is no abdominal tenderness.  Skin:    General: Skin is warm and dry.     Capillary Refill: Capillary refill takes less than 2 seconds.  Neurological:     Mental Status: He is alert. Mental status is at baseline.  Psychiatric:        Mood and Affect: Mood normal.        Behavior: Behavior normal.     ED Results / Procedures / Treatments   Labs (all labs ordered are listed, but only abnormal results are displayed) Labs Reviewed  BASIC METABOLIC PANEL -  Abnormal; Notable for the following components:      Result Value   CO2 19 (*)    Glucose, Bld 48 (*)    BUN 21 (*)    Creatinine, Ser 1.42 (*)    GFR, Estimated 58 (*)    Anion gap 16 (*)    All other components within normal limits  URINALYSIS, ROUTINE W REFLEX MICROSCOPIC - Abnormal; Notable for the following components:   Glucose, UA 150 (*)    All other components within normal limits  CBC WITH DIFFERENTIAL/PLATELET - Abnormal; Notable for the following components:   RBC 4.06 (*)    All other components within normal limits  CBG MONITORING, ED - Abnormal; Notable for the following components:   Glucose-Capillary 43 (*)    All other components within normal limits  CBG MONITORING, ED - Abnormal; Notable for the  following components:   Glucose-Capillary 223 (*)    All other components within normal limits  CBG MONITORING, ED - Abnormal; Notable for the following components:   Glucose-Capillary 150 (*)    All other components within normal limits  CBG MONITORING, ED    EKG None  Radiology No results found.  Procedures Procedures    Medications Ordered in ED Medications  sodium chloride flush (NS) 0.9 % injection 3 mL (has no administration in time range)  sodium chloride flush (NS) 0.9 % injection 3 mL (has no administration in time range)  sodium chloride flush (NS) 0.9 % injection 10 mL (has no administration in time range)  dextrose 50 % solution 50 mL (50 mLs Intravenous Given 11/26/22 0402)  ondansetron (ZOFRAN) injection 4 mg (4 mg Intravenous Given 11/26/22 0449)    ED Course/ Medical Decision Making/ A&P                                 Medical Decision Making Amount and/or Complexity of Data Reviewed Labs: ordered.  Risk Prescription drug management.   This patient presents to the ED with chief complaint(s) of hypoglycemia, general feeling of unwell, headache with pertinent past medical history of type 2 diabetes, marijuana abuse, hyperlipidemia, hypertension.  The complaint involves an extensive differential diagnosis and also carries with it a high risk of complications and morbidity.    The differential diagnosis includes hypoglycemia, other metabolic derangement, medication problem   The initial plan is to obtain labs  Additional history obtained: Records reviewed previous admission documents - patient left AMA from hospital admission.  Reviewing hospitalist note, patient was to stop taking metformin and glipizide.  Patient today reports he has not made any changes to medication and is taking them as prescribed.   Initial Assessment:   Exam significant for overall well-appearing patient who is not in acute distress.  Skin is warm and dry, no diaphoresis.  Heart  rate is normal in the 80s with regular rhythm.  Lungs are clear to auscultation bilaterally.  Abdomen is soft and nontender to palpation.  Patient is alert and oriented and answering all questions appropriately.  PERRL.  Independent ECG/labs interpretation:  The following labs were independently interpreted:  Patient's initial CBG is 43, patient's blood sugar has dropped since EMS took it.  CBC without leukocytosis or anemia.  Metabolic panel with mildly elevated anion gap.  Creatinine is elevated with a GFR of 58, this appears to be close to patient's baseline.  Glucose on metabolic panel is 48.  UA without infection.  Treatment and Reassessment: Patient was given orange juice and graham crackers without improvement in his blood sugar.  Patient was then given D50.  Recheck of CBG 223.  Will continue to monitor.  Patient's recheck of glucose is 150.  Upon reassessment, patient reports that he feels better and his headache is gone.  Patient ambulated the bathroom without difficulty.  Disposition:   Patient's hypoglycemia likely secondary to diabetic medications and being overmedicated.  Patient advised to stop taking metformin and glipizide.  He states that he has a follow-up appointment with his PCP on October 31.  Advised patient to monitor his blood sugars closely at home.  The patient has been appropriately medically screened and/or stabilized in the ED. I have low suspicion for any other emergent medical condition which would require further screening, evaluation or treatment in the ED or require inpatient management. At time of discharge the patient is hemodynamically stable and in no acute distress. I have discussed work-up results and diagnosis with patient and answered all questions. Patient is agreeable with discharge plan. We discussed strict return precautions for returning to the emergency department and they verbalized understanding.            Final Clinical Impression(s) / ED  Diagnoses Final diagnoses:  Hypoglycemia  Type 2 diabetes mellitus without complication, without long-term current use of insulin [facility])    Rx / DC Orders ED Discharge Orders     None         Lenard Simmer, PA-C 11/26/22 8756    Shon Baton, MD 11/26/22 [phone]

## 2022-11-26 NOTE — ED Notes (Addendum)
Pt provided with graham crackers,PB, & OJ.. Will recheck BS per protocol.

## 2022-11-26 NOTE — ED Notes (Signed)
CBG: 150 

## 2022-11-26 NOTE — Discharge Instructions (Signed)
Thank you for allowing [place] to be a part of your care today.  Your workup today is overall reassuring and stable.  Stop taking your metformin and glipizide.  Monitor your blood sugar closely at home and keep a record of this.  Follow-up with your primary care doctor as scheduled.  Return to the ED if you develop sudden worsening of your symptoms or if you have any new concerns.

## 2022-11-27 ENCOUNTER — Observation Stay (HOSPITAL_COMMUNITY)
Admission: EM | Admit: 2022-11-27 | Discharge: 2022-11-29 | Disposition: A | Discharge status: Home / Self Care | Payer: BLUE CROSS/BLUE SHIELD | Attending: Internal Medicine | Admitting: Internal Medicine

## 2022-11-27 ENCOUNTER — Other Ambulatory Visit: Payer: Self-pay

## 2022-11-27 ENCOUNTER — Encounter (HOSPITAL_COMMUNITY): Payer: Self-pay

## 2022-11-27 DIAGNOSIS — Z79899 Other long term (current) drug therapy: Secondary | ICD-10-CM | POA: Insufficient documentation

## 2022-11-27 DIAGNOSIS — E1165 Type 2 diabetes mellitus with hyperglycemia: Secondary | ICD-10-CM

## 2022-11-27 DIAGNOSIS — R112 Nausea with vomiting, unspecified: Secondary | ICD-10-CM

## 2022-11-27 DIAGNOSIS — Z7984 Long term (current) use of oral hypoglycemic drugs: Secondary | ICD-10-CM | POA: Insufficient documentation

## 2022-11-27 DIAGNOSIS — N1831 Chronic kidney disease, stage 3a: Secondary | ICD-10-CM | POA: Diagnosis not present

## 2022-11-27 DIAGNOSIS — I129 Hypertensive chronic kidney disease with stage 1 through stage 4 chronic kidney disease, or unspecified chronic kidney disease: Secondary | ICD-10-CM | POA: Insufficient documentation

## 2022-11-27 DIAGNOSIS — E1143 Type 2 diabetes mellitus with diabetic autonomic (poly)neuropathy: Secondary | ICD-10-CM

## 2022-11-27 DIAGNOSIS — N179 Acute kidney failure, unspecified: Principal | ICD-10-CM

## 2022-11-27 DIAGNOSIS — E1122 Type 2 diabetes mellitus with diabetic chronic kidney disease: Secondary | ICD-10-CM | POA: Insufficient documentation

## 2022-11-27 DIAGNOSIS — K219 Gastro-esophageal reflux disease without esophagitis: Secondary | ICD-10-CM | POA: Diagnosis not present

## 2022-11-27 DIAGNOSIS — E1343 Other specified diabetes mellitus with diabetic autonomic (poly)neuropathy: Secondary | ICD-10-CM

## 2022-11-27 DIAGNOSIS — E785 Hyperlipidemia, unspecified: Secondary | ICD-10-CM | POA: Insufficient documentation

## 2022-11-27 DIAGNOSIS — K3184 Gastroparesis: Secondary | ICD-10-CM

## 2022-11-27 DIAGNOSIS — E871 Hypo-osmolality and hyponatremia: Secondary | ICD-10-CM | POA: Insufficient documentation

## 2022-11-27 LAB — GLUCOSE, CAPILLARY
Glucose-Capillary: 151 mg/dL — ABNORMAL HIGH (ref 70–99)
Glucose-Capillary: 198 mg/dL — ABNORMAL HIGH (ref 70–99)
Glucose-Capillary: 306 mg/dL — ABNORMAL HIGH (ref 70–99)

## 2022-11-27 LAB — COMPREHENSIVE METABOLIC PANEL
ALT: 32 U/L (ref 0–44)
AST: 49 U/L — ABNORMAL HIGH (ref 15–41)
Albumin: 5.3 g/dL — ABNORMAL HIGH (ref 3.5–5.0)
Alkaline Phosphatase: 68 U/L (ref 38–126)
Anion gap: 19 — ABNORMAL HIGH (ref 5–15)
BUN: 42 mg/dL — ABNORMAL HIGH (ref 6–20)
CO2: 18 mmol/L — ABNORMAL LOW (ref 22–32)
Calcium: 10.1 mg/dL (ref 8.9–10.3)
Chloride: 95 mmol/L — ABNORMAL LOW (ref 98–111)
Creatinine, Ser: 2.26 mg/dL — ABNORMAL HIGH (ref 0.61–1.24)
GFR, Estimated: 33 mL/min — ABNORMAL LOW (ref >60)
Glucose, Bld: 230 mg/dL — ABNORMAL HIGH (ref 70–99)
Potassium: 4.2 mmol/L (ref 3.5–5.1)
Sodium: 132 mmol/L — ABNORMAL LOW (ref 135–145)
Total Bilirubin: 0.9 mg/dL (ref 0.3–1.2)
Total Protein: 8.8 g/dL — ABNORMAL HIGH (ref 6.5–8.1)

## 2022-11-27 LAB — CBC WITH DIFFERENTIAL/PLATELET
Abs Immature Granulocytes: 0.01 10*3/uL (ref 0.00–0.07)
Basophils Absolute: 0 10*3/uL (ref 0.0–0.1)
Basophils Relative: 1 %
Eosinophils Absolute: 0.1 10*3/uL (ref 0.0–0.5)
Eosinophils Relative: 1 %
HCT: 35.8 % — ABNORMAL LOW (ref 39.0–52.0)
Hemoglobin: 12.1 g/dL — ABNORMAL LOW (ref 13.0–17.0)
Immature Granulocytes: 0 %
Lymphocytes Relative: 16 %
Lymphs Abs: 1.1 10*3/uL (ref 0.7–4.0)
MCH: 32.3 pg (ref 26.0–34.0)
MCHC: 33.8 g/dL (ref 30.0–36.0)
MCV: 95.5 fL (ref 80.0–100.0)
Monocytes Absolute: 0.5 10*3/uL (ref 0.1–1.0)
Monocytes Relative: 7 %
Neutro Abs: 5 10*3/uL (ref 1.7–7.7)
Neutrophils Relative %: 75 %
Platelets: 326 10*3/uL (ref 150–400)
RBC: 3.75 MIL/uL — ABNORMAL LOW (ref 4.22–5.81)
RDW: 13.5 % (ref 11.5–15.5)
WBC: 6.7 10*3/uL (ref 4.0–10.5)
nRBC: 0 % (ref 0.0–0.2)

## 2022-11-27 LAB — BASIC METABOLIC PANEL
Anion gap: 16 — ABNORMAL HIGH (ref 5–15)
BUN: 36 mg/dL — ABNORMAL HIGH (ref 6–20)
CO2: 17 mmol/L — ABNORMAL LOW (ref 22–32)
Calcium: 8.7 mg/dL — ABNORMAL LOW (ref 8.9–10.3)
Chloride: 94 mmol/L — ABNORMAL LOW (ref 98–111)
Creatinine, Ser: 1.86 mg/dL — ABNORMAL HIGH (ref 0.61–1.24)
GFR, Estimated: 42 mL/min — ABNORMAL LOW (ref >60)
Glucose, Bld: 329 mg/dL — ABNORMAL HIGH (ref 70–99)
Potassium: 4.2 mmol/L (ref 3.5–5.1)
Sodium: 127 mmol/L — ABNORMAL LOW (ref 135–145)

## 2022-11-27 LAB — LIPASE, BLOOD: Lipase: 46 U/L (ref 11–51)

## 2022-11-27 LAB — HEMOGLOBIN A1C
Hgb A1c MFr Bld: 8 % — ABNORMAL HIGH (ref 4.8–5.6)
Mean Plasma Glucose: 182.9 mg/dL

## 2022-11-27 LAB — BETA-HYDROXYBUTYRIC ACID: Beta-Hydroxybutyric Acid: 1.45 mmol/L — ABNORMAL HIGH (ref 0.05–0.27)

## 2022-11-27 LAB — CBG MONITORING, ED
Glucose-Capillary: 222 mg/dL — ABNORMAL HIGH (ref 70–99)
Glucose-Capillary: 216 mg/dL — ABNORMAL HIGH (ref 70–99)

## 2022-11-27 MED ORDER — ADULT MULTIVITAMIN W/MINERALS CH
1.0000 | ORAL_TABLET | Freq: Every day | ORAL | Status: DC
  Administered 2022-11-28 – 2022-11-29 (×2): 1 via ORAL
  Filled 2022-11-27 (×2): qty 1

## 2022-11-27 MED ORDER — PRAVASTATIN SODIUM 20 MG PO TABS
10.0000 mg | ORAL_TABLET | Freq: Every day | ORAL | Status: DC
  Administered 2022-11-28 – 2022-11-29 (×2): 10 mg via ORAL
  Filled 2022-11-27 (×2): qty 1

## 2022-11-27 MED ORDER — INSULIN ASPART 100 UNIT/ML IJ SOLN: 0.0000 [IU] | Freq: Three times a day (TID) | INTRAMUSCULAR | Status: DC

## 2022-11-27 MED ORDER — ONDANSETRON HCL 4 MG/2ML IJ SOLN
4.0000 mg | Freq: Once | INTRAMUSCULAR | Status: AC
  Administered 2022-11-27: 4 mg via INTRAVENOUS
  Filled 2022-11-27: qty 2

## 2022-11-27 MED ORDER — ACETAMINOPHEN 650 MG RE SUPP: 650.0000 mg | Freq: Four times a day (QID) | RECTAL | Status: DC | PRN

## 2022-11-27 MED ORDER — TRAZODONE HCL 50 MG PO TABS
25.0000 mg | ORAL_TABLET | Freq: Every evening | ORAL | Status: DC | PRN
  Administered 2022-11-27 – 2022-11-28 (×2): 25 mg via ORAL
  Filled 2022-11-27 (×2): qty 1

## 2022-11-27 MED ORDER — MORPHINE SULFATE (PF) 4 MG/ML IV SOLN
4.0000 mg | Freq: Once | INTRAVENOUS | Status: AC
  Administered 2022-11-27: 4 mg via INTRAVENOUS
  Filled 2022-11-27: qty 1

## 2022-11-27 MED ORDER — SODIUM CHLORIDE 0.9 % IV BOLUS
1000.0000 mL | Freq: Once | INTRAVENOUS | Status: AC
  Administered 2022-11-27: 1000 mL via INTRAVENOUS

## 2022-11-27 MED ORDER — HALOPERIDOL LACTATE 5 MG/ML IJ SOLN
2.0000 mg | Freq: Once | INTRAMUSCULAR | Status: AC
  Administered 2022-11-27: 2 mg via INTRAVENOUS
  Filled 2022-11-27: qty 1

## 2022-11-27 MED ORDER — LACTATED RINGERS IV BOLUS
1000.0000 mL | Freq: Once | INTRAVENOUS | Status: AC
  Administered 2022-11-27: 1000 mL via INTRAVENOUS

## 2022-11-27 MED ORDER — ONDANSETRON HCL 4 MG PO TABS: 4.0000 mg | ORAL_TABLET | Freq: Four times a day (QID) | ORAL | Status: DC | PRN

## 2022-11-27 MED ORDER — PANTOPRAZOLE SODIUM 40 MG PO TBEC
40.0000 mg | DELAYED_RELEASE_TABLET | Freq: Two times a day (BID) | ORAL | Status: DC
Start: 2022-11-27 — End: 2022-11-29
  Administered 2022-11-27 – 2022-11-29 (×4): 40 mg via ORAL
  Filled 2022-11-27 (×4): qty 1

## 2022-11-27 MED ORDER — ACETAMINOPHEN 325 MG PO TABS: 650.0000 mg | ORAL_TABLET | Freq: Four times a day (QID) | ORAL | Status: DC | PRN

## 2022-11-27 MED ORDER — MAGNESIUM HYDROXIDE 400 MG/5ML PO SUSP
30.0000 mL | Freq: Every day | ORAL | Status: DC | PRN
Start: 2022-11-27 — End: 2022-11-29

## 2022-11-27 MED ORDER — INSULIN ASPART 100 UNIT/ML IJ SOLN
0.0000 [IU] | Freq: Three times a day (TID) | INTRAMUSCULAR | Status: DC
Start: 2022-11-27 — End: 2022-11-27
  Administered 2022-11-27: 2 [IU] via SUBCUTANEOUS

## 2022-11-27 MED ORDER — INSULIN ASPART 100 UNIT/ML IJ SOLN
0.0000 [IU] | Freq: Every day | INTRAMUSCULAR | Status: DC
  Administered 2022-11-27: 4 [IU] via SUBCUTANEOUS

## 2022-11-27 MED ORDER — KRILL OIL 1000 MG PO CAPS: ORAL_CAPSULE | Freq: Every day | ORAL | Status: DC

## 2022-11-27 MED ORDER — ENOXAPARIN SODIUM 40 MG/0.4ML IJ SOSY
40.0000 mg | PREFILLED_SYRINGE | INTRAMUSCULAR | Status: DC
  Administered 2022-11-27 – 2022-11-28 (×2): 40 mg via SUBCUTANEOUS
  Filled 2022-11-27 (×2): qty 0.4

## 2022-11-27 MED ORDER — SODIUM CHLORIDE 0.9 % IV SOLN: INTRAVENOUS | Status: DC

## 2022-11-27 MED ORDER — INSULIN ASPART 100 UNIT/ML IJ SOLN: 0.0000 [IU] | Freq: Every day | INTRAMUSCULAR | Status: DC

## 2022-11-27 MED ORDER — ONDANSETRON HCL 4 MG/2ML IJ SOLN: 4.0000 mg | Freq: Four times a day (QID) | INTRAMUSCULAR | Status: DC | PRN

## 2022-11-27 MED ORDER — INSULIN ASPART 100 UNIT/ML IJ SOLN: 0.0000 [IU] | INTRAMUSCULAR | Status: DC

## 2022-11-27 NOTE — Assessment & Plan Note (Signed)
-   We will continue PPI therapy 

## 2022-11-27 NOTE — H&P (Addendum)
[place]   PATIENT NAME: James Bradford    MR#:  [id number]  DATE OF BIRTH:  19-Mar-1965  DATE OF ADMISSION:  11/27/2022  PRIMARY CARE PHYSICIAN: Medicine, [facility]   Patient is coming from: Home  REQUESTING/REFERRING PHYSICIAN: Estelle June, MD  CHIEF COMPLAINT:   Chief Complaint  Patient presents with   Abdominal Pain   Emesis    HISTORY OF PRESENT ILLNESS:  James Bradford is a 57 y.o. African-American male with medical history significant for hypertension, dyslipidemia, marijuana abuse, stage III chronic kidney disease, type diabetes mellitus with diabetic gastroparesis, who presented to the emergency room with acute onset of intractable nausea and vomiting for the last 2 to 3 days with occasional bilious vomitus.  He admitted to diarrhea on Monday.  He has been having chills without reported fever.  He denies any dysuria, oliguria, urinary frequency or urgency or flank pain.  No headache or dizziness or blurred vision.  No cough or wheezing or hemoptysis.  No chest pain or palpitations.  ED Course: When he came to the ER, BP was 152/76 with heart rate of 53 and otherwise normal vital signs.  Labs revealed mild hyponatremia and hypochloremia and a CO2 of 18 with a glucose of 230, BUN 42 and creatinine 2.26 and calcium 10.1 with anion gap of 19.  Albumin was 5.3 and troponin 8.8 with AST of 49 and otherwise unremarkable CMP.  CBC showed mild anemia and yesterday had a UA that was positive for 150 glucose. EKG as reviewed by me : Normal sinus rhythm with a rate of 74 with Imaging: None.  The patient was given 4 mg of IV morphine sulfate twice, Zofran 4 mg IV twice, 1 L bolus of IV lactated Ringer and 1 L bolus of IV normal saline and 2 mg of IV Haldol.  He will be admitted to a medical observation bed for further evaluation and management. PAST MEDICAL HISTORY:   Past Medical History:  Diagnosis Date   Aortic atherosclerosis (HCC) 08/24/2020    Hyperlipidemia    Hypertension    Marijuana abuse 09/28/2020   Sinus bradycardia    Stage 3 chronic kidney disease (HCC)    Type 2 diabetes mellitus (HCC)    Unspecified glaucoma 02/18/2008   Qualifier: Diagnosis of  By: Daphine Deutscher FNP, Zena Amos      PAST SURGICAL HISTORY:   Past Surgical History:  Procedure Laterality Date   HAND SURGERY     LEG SURGERY      SOCIAL HISTORY:   Social History   Tobacco Use   Smoking status: Never   Smokeless tobacco: Never  Substance Use Topics   Alcohol use: No    FAMILY HISTORY:   Family History  Problem Relation Age of Onset   Hypertension Mother    Pancreatic cancer Father     DRUG ALLERGIES:   Allergies  Allergen Reactions   Ibuprofen Nausea And Vomiting and Other (See Comments)    Stomach pain and Abdominal pain, too   Pork-Derived Products Other (See Comments)    Religious reason   Whole Blood Other (See Comments)    Religious reason   Asa [Aspirin] Other (See Comments)    Stomach pain    REVIEW OF SYSTEMS:   ROS As per history of present illness. All pertinent systems were reviewed above. Constitutional, HEENT, cardiovascular, respiratory, GI, GU, musculoskeletal, neuro, psychiatric, endocrine, integumentary and hematologic systems were reviewed and are otherwise negative/unremarkable except for positive findings  mentioned above in the HPI.   MEDICATIONS AT HOME:   Prior to Admission medications   Medication Sig Start Date End Date Taking? Authorizing Provider  KRILL OIL PO Take 1 tablet by mouth daily.    [provider]  lisinopril (ZESTRIL) 10 MG tablet Take 10 mg by mouth at bedtime.    [provider]  Multiple Vitamins-Minerals (MENS 50+ MULTIVITAMIN) TABS Take 1 tablet by mouth daily.    [provider]  pantoprazole (PROTONIX) 40 MG tablet Take 1 tablet (40 mg total) by mouth 2 (two) times daily before a meal. 09/05/22   Lanae Boast, MD  pravastatin (PRAVACHOL) 10 MG tablet Take 1  tablet (10 mg total) by mouth daily. 03/22/22   Rodolph Bong, MD      VITAL SIGNS:  Blood pressure 136/79, pulse 86, temperature 98.5 F (36.9 C), temperature source Oral, resp. rate 16, height 5' 7.01" (1.702 m), weight 70.4 kg, SpO2 100%.  PHYSICAL EXAMINATION:  Physical Exam  GENERAL:  57 y.o.-year-old African-American male patient lying in the bed with no acute distress.  EYES: Pupils equal, round, reactive to light and accommodation. No scleral icterus. Extraocular muscles intact.  HEENT: Head atraumatic, normocephalic. Oropharynx and nasopharynx clear.  NECK:  Supple, no jugular venous distention. No thyroid enlargement, no tenderness.  LUNGS: Normal breath sounds bilaterally, no wheezing, rales,rhonchi or crepitation. No use of accessory muscles of respiration.  CARDIOVASCULAR: Regular rate and rhythm, S1, S2 normal. No murmurs, rubs, or gallops.  ABDOMEN: Soft, nondistended, with mild epigastric tenderness without rebound tenderness guarding or rigidity.  Bowel sounds present. No organomegaly or mass.  EXTREMITIES: No pedal edema, cyanosis, or clubbing.  NEUROLOGIC: Cranial nerves II through XII are intact. Muscle strength 5/5 in all extremities. Sensation intact. Gait not checked.  PSYCHIATRIC: The patient is alert and oriented x 3.  Normal affect and good eye contact. SKIN: No obvious rash, lesion, or ulcer.   LABORATORY PANEL:   CBC Recent Labs  Lab 11/27/22 1430  WBC 6.7  HGB 12.1*  HCT 35.8*  PLT 326   ------------------------------------------------------------------------------------------------------------------  Chemistries  Recent Labs  Lab 11/27/22 1430  NA 132*  K 4.2  CL 95*  CO2 18*  GLUCOSE 230*  BUN 42*  CREATININE 2.26*  CALCIUM 10.1  AST 49*  ALT 32  ALKPHOS 68  BILITOT 0.9   ------------------------------------------------------------------------------------------------------------------  Cardiac Enzymes No results for input(s):  "TROPONINI" in the last 168 hours. ------------------------------------------------------------------------------------------------------------------  RADIOLOGY:  No results found.    IMPRESSION AND PLAN:  Assessment and Plan: * AKI (acute kidney injury) (HCC) - This is likely prerenal due to recurrent nausea and vomiting with subsequent dehydration. - The patient will be hydrated with IV normal saline. - Will follow BMP. - We will avoid nephrotoxins.  Diabetic gastroparesis (HCC) - This could be the culprit for his recurrent nausea and vomiting. - Differential diagnoses include hyperemesis due to marijuana. - We will check urine drug screen. - We will place him on scheduled IV Reglan. - IV PPI therapy will be provided.   Uncontrolled type 2 diabetes mellitus with hyperglycemia (HCC) - The patient was slightly acidotic with slightly elevated anion gap that should have corrected now with aggressive initial hydration. - We will continue monitoring fingerstick blood glucose measures and placed on supplemental coverage with NovoLog. - We will hold off metformin and continue glipizide.  GERD without esophagitis - We will continue PPI therapy.  Dyslipidemia - We will continue statin therapy.  DVT prophylaxis: Lovenox.  Advanced Care Planning:  Code Status: He is DNR only.  I discussed CODE STATUS with him and he stated that he wants to be DNR for religious belief. Family Communication:  The plan of care was discussed in details with the patient (and family). I answered all questions. The patient agreed to proceed with the above mentioned plan. Further management will depend upon hospital course. Disposition Plan: Back to previous home environment Consults called: none.  All the records are reviewed and case discussed with ED provider.  Status is: Observation  I certify that at the time of admission, it is my clinical judgment that the patient will require hospital care  extending less than 2 midnights.                            Dispo: The patient is from: Home              Anticipated d/c is to: Home              Patient currently is not medically stable to d/c.              Difficult to place patient: No  Hannah Beat M.D on 11/27/2022 at 8:16 PM  [facility]   From 7 PM-7 AM, contact night-coverage [email or web address]  CC: Primary care physician; Medicine, [facility] -This

## 2022-11-27 NOTE — ED Triage Notes (Signed)
Per EMS, Pt reports emesis and abd pain episode this morning after eating steak. Pt reports some blood in emesis, EMS was unable to see any. Hx of gastroparesis.

## 2022-11-27 NOTE — Assessment & Plan Note (Signed)
-   This is likely prerenal due to recurrent nausea and vomiting with subsequent dehydration. - The patient will be hydrated with IV normal saline. - Will follow BMP. - We will avoid nephrotoxins.

## 2022-11-27 NOTE — ED Notes (Signed)
ED TO INPATIENT HANDOFF REPORT  Name/Age/Gender James Bradford 57 y.o. male  Code Status    Code Status Orders  (From admission, onward)           Start     Ordered   11/27/22 1606  Do not attempt resuscitation (DNR)- Limited -Do Not Intubate (DNI)  Continuous       Question Answer Comment  If pulseless and not breathing No CPR or chest compressions.   In Pre-Arrest Conditions (Patient Is Breathing and Has A Pulse) Do not intubate. Provide all appropriate non-invasive medical interventions. Avoid ICU transfer unless indicated or required.   Consent: Discussion documented in EHR or advanced directives reviewed      11/27/22 1608           Code Status History     Date Active Date Inactive Code Status Order ID Comments User Context   11/23/2022 1225 11/25/2022 0324 Limited: Do not attempt resuscitation (DNR) -DNR-LIMITED -Do Not Intubate/DNI  [id number]  Rana Snare, DO ED   09/01/2022 2047 09/05/2022 1908 DNR [id number]  Rometta Emery, MD ED   06/17/2022 2052 06/18/2022 1420 DNR [id number]  Charlsie Quest, MD ED   03/19/2022 0513 03/22/2022 2112 Full Code [id number]  Luiz Iron, NP Inpatient   12/09/2021 1612 12/13/2021 1915 Full Code [id number]  Rolly Salter, MD ED   09/09/2021 1639 09/10/2021 2047 DNR [id number]  Teddy Spike, DO ED   01/12/2021 2130 01/16/2021 1623 Full Code [id number]  John Giovanni, MD ED   09/28/2020 0923 09/30/2020 0542 Full Code [id number]  Jonah Blue, MD ED   08/23/2020 2021 08/24/2020 1943 Full Code [id number]  Bobette Mo, MD ED   04/23/2019 1453 04/24/2019 2041 DNR [id number]  Marthenia Rolling, DO ED   04/23/2019 1356 04/23/2019 1453 Full Code [id number]  Marthenia Rolling, DO ED       Home/SNF/Other Home  Chief Complaint AKI (acute kidney injury) (HCC) [N17.9]  Level of Care/Admitting Diagnosis ED Disposition     ED Disposition  Admit   Condition  --   Comment  Hospital Area: [facility] [[id number]]  Level of  Care: Med-Surg [16]  May place patient in observation at [facility] or [facility] if equivalent level of care is available:: No  Covid Evaluation: Asymptomatic - no recent exposure (last 10 days) testing not required  Diagnosis: AKI (acute kidney injury) [facility]) [[id number]]  Admitting Physician: Hannah Beat [[id number]]  Attending Physician: Hannah Beat [[id number]]          Medical History Past Medical History:  Diagnosis Date   Aortic atherosclerosis (HCC) 08/24/2020   Hyperlipidemia    Hypertension    Marijuana abuse 09/28/2020   Sinus bradycardia    Stage 3 chronic kidney disease (HCC)    Type 2 diabetes mellitus (HCC)    Unspecified glaucoma 02/18/2008   Qualifier: Diagnosis of  By: Daphine Deutscher FNP, [place]      Allergies Allergies  Allergen Reactions   Ibuprofen Other (See Comments) and Nausea And Vomiting    Stomach pain  Other Reaction(s) from [facility]: Abdominal pain, Nausea   Pork-Derived Products Other (See Comments)    religious   Jonne Ply [Aspirin] Other (See Comments)    Stomach pain    IV Location/Drains/Wounds Patient Lines/Drains/Airways Status     Active Line/Drains/Airways     Name Placement date Placement time Site Days   Peripheral IV 11/27/22 20 G 1.25" Anterior;Distal;Right;Upper Arm 11/27/22  1320  Arm  less  than 1            Labs/Imaging Results for orders placed or performed during the hospital encounter of 11/27/22 (from the past 48 hour(s))  POC CBG, ED     Status: Abnormal   Collection Time: 11/27/22  1:28 PM  Result Value Ref Range   Glucose-Capillary 222 (H) 70 - 99 mg/dL    Comment: Glucose reference range applies only to samples taken after fasting for at least 8 hours.  Comprehensive metabolic panel     Status: Abnormal   Collection Time: 11/27/22  2:30 PM  Result Value Ref Range   Sodium 132 (L) 135 - 145 mmol/L   Potassium 4.2 3.5 - 5.1 mmol/L   Chloride 95 (L) 98 - 111 mmol/L   CO2 18 (L) 22 - 32 mmol/L   Glucose, Bld 230 (H)  70 - 99 mg/dL    Comment: Glucose reference range applies only to samples taken after fasting for at least 8 hours.   BUN 42 (H) 6 - 20 mg/dL   Creatinine, Ser 0.45 (H) 0.61 - 1.24 mg/dL   Calcium 40.9 8.9 - 81.1 mg/dL   Total Protein 8.8 (H) 6.5 - 8.1 g/dL   Albumin 5.3 (H) 3.5 - 5.0 g/dL   AST 49 (H) 15 - 41 U/L   ALT 32 0 - 44 U/L   Alkaline Phosphatase 68 38 - 126 U/L   Total Bilirubin 0.9 0.3 - 1.2 mg/dL   GFR, Estimated 33 (L) >60 mL/min    Comment: (NOTE) Calculated using the CKD-EPI Creatinine Equation (2021)    Anion gap 19 (H) 5 - 15    Comment: Performed at [facility], [address], [place], [place] [address]  Lipase, blood     Status: None   Collection Time: 11/27/22  2:30 PM  Result Value Ref Range   Lipase 46 11 - 51 U/L    Comment: Performed at [facility], [address] [place], [place] [address]  CBC with Differential     Status: Abnormal   Collection Time: 11/27/22  2:30 PM  Result Value Ref Range   WBC 6.7 4.0 - 10.5 K/uL   RBC 3.75 (L) 4.22 - 5.81 MIL/uL   Hemoglobin 12.1 (L) 13.0 - 17.0 g/dL   HCT 13.0 (L) 86.5 - 78.4 %   MCV 95.5 80.0 - 100.0 fL   MCH 32.3 26.0 - 34.0 pg   MCHC 33.8 30.0 - 36.0 g/dL   RDW 69.6 29.5 - 28.4 %   Platelets 326 150 - 400 K/uL   nRBC 0.0 0.0 - 0.2 %   Neutrophils Relative % 75 %   Neutro Abs 5.0 1.7 - 7.7 K/uL   Lymphocytes Relative 16 %   Lymphs Abs 1.1 0.7 - 4.0 K/uL   Monocytes Relative 7 %   Monocytes Absolute 0.5 0.1 - 1.0 K/uL   Eosinophils Relative 1 %   Eosinophils Absolute 0.1 0.0 - 0.5 K/uL   Basophils Relative 1 %   Basophils Absolute 0.0 0.0 - 0.1 K/uL   Immature Granulocytes 0 %   Abs Immature Granulocytes 0.01 0.00 - 0.07 K/uL    Comment: Performed at [facility], [address] [place], [place] [address]  POC CBG, ED     Status: Abnormal   Collection Time: 11/27/22  3:32 PM  Result Value Ref Range   Glucose-Capillary 216 (H) 70 - 99  mg/dL    Comment: Glucose reference range  applies only to samples taken after fasting for at least 8 hours.   No results found.  Pending Labs Unresulted Labs (From admission, onward)     Start     Ordered   11/28/22 0500  Basic metabolic panel  Tomorrow morning,   R        11/27/22 1608   11/28/22 0500  CBC  Tomorrow morning,   R        11/27/22 1608            Vitals/Pain Today's Vitals   11/27/22 1242 11/27/22 1300 11/27/22 1458  BP:  (!) 152/76   Pulse:  (!) 53   Resp:  16   SpO2:  100%   PainSc: 10-Worst pain ever  7     Isolation Precautions No active isolations  Medications Medications  pravastatin (PRAVACHOL) tablet 10 mg (has no administration in time range)  pantoprazole (PROTONIX) EC tablet 40 mg (has no administration in time range)  multivitamin with minerals tablet 1 tablet (has no administration in time range)  enoxaparin (LOVENOX) injection 40 mg (has no administration in time range)  0.9 %  sodium chloride infusion (has no administration in time range)  acetaminophen (TYLENOL) tablet 650 mg (has no administration in time range)    Or  acetaminophen (TYLENOL) suppository 650 mg (has no administration in time range)  traZODone (DESYREL) tablet 25 mg (has no administration in time range)  magnesium hydroxide (MILK OF MAGNESIA) suspension 30 mL (has no administration in time range)  ondansetron (ZOFRAN) tablet 4 mg (has no administration in time range)    Or  ondansetron (ZOFRAN) injection 4 mg (has no administration in time range)  ondansetron (ZOFRAN) injection 4 mg (4 mg Intravenous Given 11/27/22 1329)  sodium chloride 0.9 % bolus 1,000 mL (0 mLs Intravenous Stopped 11/27/22 1556)  morphine (PF) 4 MG/ML injection 4 mg (4 mg Intravenous Given 11/27/22 1332)  haloperidol lactate (HALDOL) injection 2 mg (2 mg Intravenous Given 11/27/22 1333)  morphine (PF) 4 MG/ML injection 4 mg (4 mg Intravenous Given 11/27/22 1537)  ondansetron (ZOFRAN) injection 4  mg (4 mg Intravenous Given 11/27/22 1533)  lactated ringers bolus 1,000 mL (1,000 mLs Intravenous New Bag/Given 11/27/22 1555)    Mobility walks

## 2022-11-27 NOTE — Assessment & Plan Note (Signed)
-   The patient was slightly acidotic with slightly elevated anion gap that should have corrected now with aggressive initial hydration. - We will continue monitoring fingerstick blood glucose measures and placed on supplemental coverage with NovoLog. - We will hold off metformin and continue glipizide.

## 2022-11-27 NOTE — Assessment & Plan Note (Signed)
-   This could be the culprit for his recurrent nausea and vomiting. - Differential diagnoses include hyperemesis due to marijuana. - We will check urine drug screen. - We will place him on scheduled IV Reglan. - IV PPI therapy will be provided.

## 2022-11-27 NOTE — Assessment & Plan Note (Signed)
-   We will continue statin therapy. 

## 2022-11-27 NOTE — ED Provider Notes (Addendum)
EMERGENCY DEPARTMENT AT [facility] Provider Note   CSN: [id number] Arrival date & time: 11/27/22  1233     History  Chief Complaint  Patient presents with   Abdominal Pain   Emesis    Roswell Olcott is a 57 y.o. male.  With a past medical history of type 2 diabetes, DKA, CKD, and hypertension who presents to the ED for nausea vomiting abdominal pain.  Patient reports that he awoke around 0300 this morning and vomited what he had for dinner last night (steak).  He has had repeated episodes of vomiting since that time.  He notes "a little bit" of blood within the emesis this morning but denies frank hematemesis.  States today's episode feels similar to prior episodes of gastroparesis.  No fevers, chills, chest pain or shortness of breath.  Point-of-care blood glucose with EMS was in the 100s.   Abdominal Pain Associated symptoms: vomiting   Emesis Associated symptoms: abdominal pain        Home Medications Prior to Admission medications   Medication Sig Start Date End Date Taking? Authorizing Provider  KRILL OIL PO Take 1 tablet by mouth daily.    [provider]  lisinopril (ZESTRIL) 10 MG tablet Take 10 mg by mouth at bedtime.    [provider]  Multiple Vitamins-Minerals (MENS 50+ MULTIVITAMIN) TABS Take 1 tablet by mouth daily.    [provider]  pantoprazole (PROTONIX) 40 MG tablet Take 1 tablet (40 mg total) by mouth 2 (two) times daily before a meal. 09/05/22   Lanae Boast, MD  pravastatin (PRAVACHOL) 10 MG tablet Take 1 tablet (10 mg total) by mouth daily. 03/22/22   Rodolph Bong, MD      Allergies    Ibuprofen, Pork-derived products, and Asa [aspirin]    Review of Systems   Review of Systems  Gastrointestinal:  Positive for abdominal pain and vomiting.    Physical Exam Updated Vital Signs BP (!) 152/76   Pulse (!) 53   Resp 16   SpO2 100%  Physical Exam Vitals and nursing note reviewed.  HENT:     Head:  Normocephalic and atraumatic.  Eyes:     Pupils: Pupils are equal, round, and reactive to light.  Cardiovascular:     Rate and Rhythm: Normal rate and regular rhythm.  Pulmonary:     Effort: Pulmonary effort is normal.     Breath sounds: Normal breath sounds.  Abdominal:     Palpations: Abdomen is soft.     Tenderness: There is no abdominal tenderness.  Skin:    General: Skin is warm and dry.  Neurological:     Mental Status: He is alert.  Psychiatric:        Mood and Affect: Mood normal.     ED Results / Procedures / Treatments   Labs (all labs ordered are listed, but only abnormal results are displayed) Labs Reviewed  COMPREHENSIVE METABOLIC PANEL - Abnormal; Notable for the following components:      Result Value   Sodium 132 (*)    Chloride 95 (*)    CO2 18 (*)    Glucose, Bld 230 (*)    BUN 42 (*)    Creatinine, Ser 2.26 (*)    Total Protein 8.8 (*)    Albumin 5.3 (*)    AST 49 (*)    GFR, Estimated 33 (*)    Anion gap 19 (*)    All other components within normal limits  CBC WITH DIFFERENTIAL/PLATELET - Abnormal; Notable for the following components:   RBC 3.75 (*)    Hemoglobin 12.1 (*)    HCT 35.8 (*)    All other components within normal limits  CBG MONITORING, ED - Abnormal; Notable for the following components:   Glucose-Capillary 222 (*)    All other components within normal limits  LIPASE, BLOOD  CBG MONITORING, ED    EKG None  Radiology No results found.  Procedures Procedures    Medications Ordered in ED Medications  morphine (PF) 4 MG/ML injection 4 mg (has no administration in time range)  lactated ringers bolus 1,000 mL (has no administration in time range)  ondansetron (ZOFRAN) injection 4 mg (4 mg Intravenous Given 11/27/22 1329)  sodium chloride 0.9 % bolus 1,000 mL (1,000 mLs Intravenous New Bag/Given 11/27/22 1353)  morphine (PF) 4 MG/ML injection 4 mg (4 mg Intravenous Given 11/27/22 1332)  haloperidol lactate (HALDOL)  injection 2 mg (2 mg Intravenous Given 11/27/22 1333)  ondansetron (ZOFRAN) injection 4 mg (4 mg Intravenous Given 11/27/22 1533)    ED Course/ Medical Decision Making/ A&P Clinical Course as of 11/27/22 1537  Thu Nov 27, 2022  1529 Blood glucose of 222.  Not consistent with DKA or HHS.  CMP reveals AKI.  No leukocytosis.  Reassessed patient.  He reports improvement in his abdominal pain and nausea rating it is a 5 out of 10.  Will provide another dose of analgesics, antiemetics and another liter of IV fluids.  Will admit to medicine [MP]    Clinical Course User Index [MP] Royanne Foots, DO                                 Medical Decision Making 57 year old male with history as above presenting for nausea vomiting and abdominal pain.  Symptoms started this morning and have persisted since the onset.  Actively dry heaving on my exam.  Hemodynamically stable.  No focal abdominal tenderness on my exam.  Low suspicion for DKA/HHS given that initial blood glucose was in 100s but will recheck here.  Most likely diabetic gastroparesis.  Will treat with Zofran, morphine and 2 mg Haldol for symptomatic relief and provide IV fluids for rehydration.  Will check CBC, CMP and lipase and EKG given concern for potential electrolyte imbalance.  Amount and/or Complexity of Data Reviewed Labs: ordered.  Risk Prescription drug management. Decision regarding hospitalization.           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

## 2022-11-28 DIAGNOSIS — R112 Nausea with vomiting, unspecified: Secondary | ICD-10-CM

## 2022-11-28 DIAGNOSIS — N179 Acute kidney failure, unspecified: Secondary | ICD-10-CM | POA: Diagnosis not present

## 2022-11-28 DIAGNOSIS — E1343 Other specified diabetes mellitus with diabetic autonomic (poly)neuropathy: Secondary | ICD-10-CM | POA: Diagnosis not present

## 2022-11-28 LAB — GLUCOSE, CAPILLARY
Glucose-Capillary: 108 mg/dL — ABNORMAL HIGH (ref 70–99)
Glucose-Capillary: 110 mg/dL — ABNORMAL HIGH (ref 70–99)
Glucose-Capillary: 118 mg/dL — ABNORMAL HIGH (ref 70–99)
Glucose-Capillary: 118 mg/dL — ABNORMAL HIGH (ref 70–99)
Glucose-Capillary: 144 mg/dL — ABNORMAL HIGH (ref 70–99)
Glucose-Capillary: 144 mg/dL — ABNORMAL HIGH (ref 70–99)
Glucose-Capillary: 175 mg/dL — ABNORMAL HIGH (ref 70–99)
Glucose-Capillary: 179 mg/dL — ABNORMAL HIGH (ref 70–99)
Glucose-Capillary: 203 mg/dL — ABNORMAL HIGH (ref 70–99)
Glucose-Capillary: 249 mg/dL — ABNORMAL HIGH (ref 70–99)
Glucose-Capillary: 25 mg/dL — CL (ref 70–99)
Glucose-Capillary: 288 mg/dL — ABNORMAL HIGH (ref 70–99)
Glucose-Capillary: 49 mg/dL — ABNORMAL LOW (ref 70–99)
Glucose-Capillary: 68 mg/dL — ABNORMAL LOW (ref 70–99)

## 2022-11-28 LAB — BASIC METABOLIC PANEL
Anion gap: 10 (ref 5–15)
Anion gap: 11 (ref 5–15)
BUN: 30 mg/dL — ABNORMAL HIGH (ref 6–20)
BUN: 33 mg/dL — ABNORMAL HIGH (ref 6–20)
CO2: 20 mmol/L — ABNORMAL LOW (ref 22–32)
CO2: 21 mmol/L — ABNORMAL LOW (ref 22–32)
Calcium: 8.8 mg/dL — ABNORMAL LOW (ref 8.9–10.3)
Calcium: 9.2 mg/dL (ref 8.9–10.3)
Chloride: 102 mmol/L (ref 98–111)
Chloride: 103 mmol/L (ref 98–111)
Creatinine, Ser: 1.44 mg/dL — ABNORMAL HIGH (ref 0.61–1.24)
Creatinine, Ser: 1.63 mg/dL — ABNORMAL HIGH (ref 0.61–1.24)
GFR, Estimated: 49 mL/min — ABNORMAL LOW (ref >60)
GFR, Estimated: 57 mL/min — ABNORMAL LOW (ref >60)
Glucose, Bld: 120 mg/dL — ABNORMAL HIGH (ref 70–99)
Glucose, Bld: 206 mg/dL — ABNORMAL HIGH (ref 70–99)
Potassium: 3.9 mmol/L (ref 3.5–5.1)
Potassium: 4.4 mmol/L (ref 3.5–5.1)
Sodium: 133 mmol/L — ABNORMAL LOW (ref 135–145)
Sodium: 134 mmol/L — ABNORMAL LOW (ref 135–145)

## 2022-11-28 LAB — CBC
HCT: 33.4 % — ABNORMAL LOW (ref 39.0–52.0)
Hemoglobin: 11.2 g/dL — ABNORMAL LOW (ref 13.0–17.0)
MCH: 32.5 pg (ref 26.0–34.0)
MCHC: 33.5 g/dL (ref 30.0–36.0)
MCV: 96.8 fL (ref 80.0–100.0)
Platelets: 286 10*3/uL (ref 150–400)
RBC: 3.45 MIL/uL — ABNORMAL LOW (ref 4.22–5.81)
RDW: 13.4 % (ref 11.5–15.5)
WBC: 7.5 10*3/uL (ref 4.0–10.5)
nRBC: 0 % (ref 0.0–0.2)

## 2022-11-28 LAB — BLOOD GAS, VENOUS
Acid-base deficit: 0.2 mmol/L (ref 0.0–2.0)
Bicarbonate: 24.8 mmol/L (ref 20.0–28.0)
O2 Saturation: 57.6 %
Patient temperature: 36.9
pCO2, Ven: 41 mm[Hg] — ABNORMAL LOW (ref 44–60)
pH, Ven: 7.39 (ref 7.25–7.43)
pO2, Ven: 35 mm[Hg] (ref 32–45)

## 2022-11-28 LAB — MRSA NEXT GEN BY PCR, NASAL: MRSA by PCR Next Gen: NOT DETECTED

## 2022-11-28 LAB — BETA-HYDROXYBUTYRIC ACID: Beta-Hydroxybutyric Acid: 0.63 mmol/L — ABNORMAL HIGH (ref 0.05–0.27)

## 2022-11-28 MED ORDER — CHLORHEXIDINE GLUCONATE CLOTH 2 % EX PADS
6.0000 | MEDICATED_PAD | Freq: Every day | CUTANEOUS | Status: DC
Start: 2022-11-28 — End: 2022-11-29
  Administered 2022-11-28 – 2022-11-29 (×3): 6 via TOPICAL

## 2022-11-28 MED ORDER — DEXTROSE 50 % IV SOLN
25.0000 g | INTRAVENOUS | Status: AC
  Administered 2022-11-28: 25 g via INTRAVENOUS

## 2022-11-28 MED ORDER — INSULIN REGULAR(HUMAN) IN NACL 100-0.9 UT/100ML-% IV SOLN
INTRAVENOUS | Status: DC
  Administered 2022-11-28: 1.4 [IU]/h via INTRAVENOUS
  Filled 2022-11-28 (×2): qty 100

## 2022-11-28 MED ORDER — LACTATED RINGERS IV SOLN: INTRAVENOUS | Status: AC

## 2022-11-28 MED ORDER — DEXTROSE 50 % IV SOLN
0.0000 mL | INTRAVENOUS | Status: DC | PRN
Start: 2022-11-28 — End: 2022-11-29
  Filled 2022-11-28: qty 50

## 2022-11-28 MED ORDER — ORAL CARE MOUTH RINSE: 15.0000 mL | OROMUCOSAL | Status: DC | PRN

## 2022-11-28 MED ORDER — DEXTROSE IN LACTATED RINGERS 5 % IV SOLN: INTRAVENOUS | Status: AC

## 2022-11-28 MED ORDER — DEXTROSE 50 % IV SOLN
50.0000 mL | Freq: Once | INTRAVENOUS | Status: AC
  Administered 2022-11-28: 50 mL via INTRAVENOUS
  Filled 2022-11-28: qty 50

## 2022-11-28 MED ORDER — INSULIN GLARGINE-YFGN 100 UNIT/ML ~~LOC~~ SOLN
5.0000 [IU] | SUBCUTANEOUS | Status: DC
  Administered 2022-11-28: 5 [IU] via SUBCUTANEOUS
  Filled 2022-11-28 (×2): qty 0.05

## 2022-11-28 MED ORDER — INSULIN ASPART 100 UNIT/ML IJ SOLN
0.0000 [IU] | Freq: Three times a day (TID) | INTRAMUSCULAR | Status: DC
  Administered 2022-11-28: 5 [IU] via SUBCUTANEOUS
  Administered 2022-11-28 – 2022-11-29 (×3): 3 [IU] via SUBCUTANEOUS

## 2022-11-28 NOTE — Progress Notes (Signed)
[facility]                                                                              [name], is a 57 y.o. male, DOB - 1965-04-28, XLK:[id number] Admit date - 11/27/2022    Outpatient Primary MD for the patient is [facility]  LOS - 0  days  Chief Complaint  Patient presents with   Abdominal Pain   Emesis       Brief summary   Patient is a 57 year old male with HTN, dyslipidemia, marijuana abuse, CKD stage IIaI, DM type II, on oral hypoglycemics presented to ED with acute onset of intractable nausea and vomiting for last 2 to 3 days.  Patient also reported diarrhea on Monday, having chills without reported fever.  In ED, BP 152/76, HR 53 otherwise normal vital signs. Labs showed mild hyponatremia, hypochloremia, CO2 of 18, glucose 230, BUN 42, creatinine 2.26, calcium 10.1 with anion gap of 19.  CBC showed mild anemia.  UA positive for 150 glucose.   Assessment & Plan    Principal Problem:   AKI (acute kidney injury) (HCC) superimposed on CKD stage IIIa -Prerenal likely precipitated due to acute nausea vomiting for last 2 to 3 days with poor p.o. intake and diarrhea x 1 day -Presented with creatinine of 2.26.  Baseline creatinine 1.3-1.5  -Continue IV fluid hydration, creatinine improving, 1.4  Active Problems:   Uncontrolled type 2 diabetes mellitus with hyperglycemia (HCC), HONK -Overnight noted to be acidotic with elevated anion gap, elevated BHB, UA was negative for ketones -Patient was placed on IV fluids and IV insulin drip -Transitioned to Semglee 5 units daily with sliding scale insulin, DC drip after 2 hours -Diabetic coordinator consult, hemoglobin A1c 8    Diabetic gastroparesis (HCC) -Likely the cause of recurrent nausea and vomiting, although can be hyperemesis due to marijuana -Continue IV Reglan, IV PPI  Hyponatremia -Likely due to dehydration, #1, continue IV fluid hydration. -Sodium 127 yesterday,  improving to 133 -Encourage oral diet    Dyslipidemia -Continue statin     GERD without esophagitis -Continue PPI   Estimated body mass index is 24.3 kg/m as calculated from the following:   Height as of this encounter: 5' 7.01" (1.702 m).   Weight as of this encounter: 70.4 kg.  Code Status: DNR DVT Prophylaxis:  enoxaparin (LOVENOX) injection 40 mg Start: 11/27/22 2200   Level of Care: Level of care: Stepdown Family Communication: Updated patient Disposition Plan:      Remains inpatient appropriate:   Will transfer to the floor once off the drips, assess if he is able to tolerate diet and no further nausea and vomiting.  Possible DC home tomorrow   Procedures:    Consultants:    Antimicrobials:   Anti-infectives (From admission, onward)    None          Medications  Chlorhexidine Gluconate Cloth  6 each Topical Daily   enoxaparin (LOVENOX) injection  40 mg Subcutaneous Q24H   insulin aspart  0-9 Units Subcutaneous TID WC   insulin glargine-yfgn  5 Units Subcutaneous Q24H   multivitamin with minerals  1 tablet Oral Daily   pantoprazole  40 mg Oral BID AC   pravastatin  10 mg Oral Daily      Subjective:   James Bradford was seen and examined today.  Feeling somewhat better, no acute ongoing nausea and vomiting.  Received Semglee and subcu insulin, awaiting drip to be off and then start diet.    Objective:   Vitals:   11/28/22 0038 11/28/22 0400 11/28/22 0645 11/28/22 0908  BP: 137/67  127/78   Pulse: 78 85 85   Resp: 13 (!) 8 12   Temp: 98.8 F (37.1 C) 97.9 F (36.6 C)  97.9 F (36.6 C)  TempSrc: Oral Oral  Oral  SpO2: 99% 97% 98%   Weight:      Height:        Intake/Output Summary (Last 24 hours) at 11/28/2022 0915 Last data filed at 11/28/2022 0600 Gross per 24 hour  Intake 2018.19 ml  Output 1250 ml  Net 768.19 ml     Wt Readings from Last 3 Encounters:  11/27/22 70.4 kg  11/26/22 68 kg  11/23/22 68 kg     Exam General:  Alert and oriented x 3, NAD Cardiovascular: S1 S2 auscultated,  RRR Respiratory: Clear to auscultation bilaterally, no wheezing, rales or rhonchi Gastrointestinal: Soft, nontender, nondistended, + bowel sounds Ext: no pedal edema bilaterally Neuro: no new deficits Psych: Normal affect     Data Reviewed:  I have personally reviewed following labs    CBC Lab Results  Component Value Date   WBC 7.5 11/28/2022   RBC 3.45 (L) 11/28/2022   HGB 11.2 (L) 11/28/2022   HCT 33.4 (L) 11/28/2022   MCV 96.8 11/28/2022   MCH 32.5 11/28/2022   PLT 286 11/28/2022   MCHC 33.5 11/28/2022   RDW 13.4 11/28/2022   LYMPHSABS 1.1 11/27/2022   MONOABS 0.5 11/27/2022   EOSABS 0.1 11/27/2022   BASOSABS 0.0 11/27/2022     Last metabolic panel Lab Results  Component Value Date   NA 133 (L) 11/28/2022   K 3.9 11/28/2022   CL 103 11/28/2022   CO2 20 (L) 11/28/2022   BUN 30 (H) 11/28/2022   CREATININE 1.44 (H) 11/28/2022   GLUCOSE 206 (H) 11/28/2022   GFRNONAA 57 (L) 11/28/2022   GFRAA 59 (L) 10/25/2019   CALCIUM 8.8 (L) 11/28/2022   PHOS 2.8 09/03/2022   PROT 8.8 (H) 11/27/2022   ALBUMIN 5.3 (H) 11/27/2022   BILITOT 0.9 11/27/2022   ALKPHOS 68 11/27/2022   AST 49 (H) 11/27/2022   ALT 32 11/27/2022   ANIONGAP 10 11/28/2022    CBG (last 3)  Recent Labs    11/28/22 0533 11/28/22 0636 11/28/22 0750  GLUCAP 179* 203* 175*      Coagulation Profile: No results for input(s): "INR", "PROTIME" in the last 168 hours.   Radiology Studies: I have personally reviewed the imaging studies  No results found.     Thad Ranger M.D. [facility] 11/28/2022, 9:15 AM  Available via Epic secure chat 7am-7pm After 7 pm, please refer to night coverage provider listed on amion.

## 2022-11-28 NOTE — Inpatient Diabetes Management (Signed)
Inpatient Diabetes Program Recommendations  AACE/ADA: New Consensus Statement on Inpatient Glycemic Control (2015)  Target Ranges:  Prepandial:   less than 140 mg/dL      Peak postprandial:   less than 180 mg/dL (1-2 hours)      Critically ill patients:  140 - 180 mg/dL   Lab Results  Component Value Date   GLUCAP 108 (H) 11/28/2022   HGBA1C 8.0 (H) 11/27/2022    Review of Glycemic Control  Latest Reference Range & Units 11/28/22 03:28 11/28/22 05:01 11/28/22 05:33 11/28/22 06:36 11/28/22 07:50 11/28/22 09:20 11/28/22 09:54  Glucose-Capillary 70 - 99 mg/dL 409 (H) 49 (L) 811 (H) 203 (H) 175 (H) 68 (L) 108 (H)   Diabetes history: DM 2 Outpatient Diabetes medications: Glipizide 10 mg bid, metformin 500 mg bid (has been on basal insulin in the past) Current orders for Inpatient glycemic control:  Semglee 5 units Novolog 0-9 units tid  A1c 8% on 10/24  [facility], next follow up appointment next Thursday on Oct 31.  Spoke with pt at bedside regarding glucose trends and control at home. Pt reports seeing his PCP every 3 months. Pt also said that his symptoms are side effects of the metformin. Pt has been on basal insulin in the past. Pt was experiencing some hypoglycemia prior to admission. Due to renal function and hypoglycemia at home. Pt may benefit from reducing home glipizide dose to 5 mg bid and start Tradjenta 5 mg Daily. Follow up with PCP next week.  Discuss glucose and A1c goals. Pt reports having all needed supplies and medications at home for self management.  Thanks,  Christena Deem RN, MSN, BC-ADM Inpatient Diabetes Coordinator Team Pager [phone] (8a-5p)

## 2022-11-28 NOTE — Progress Notes (Signed)
Patient Name: James Bradford           DOB: 1965/05/20  MRN: [id number]      Admission Date: 11/27/2022  Attending Provider: Hannah Beat, MD  Primary Diagnosis: AKI (acute kidney injury) [facility])   Level of care: Stepdown    CROSS COVER NOTE   Date of Service   11/28/2022   James Bradford, 57 y.o. male, was admitted on 11/27/2022 for AKI (acute kidney injury) [facility]).    HPI/Events of Note   Follow up on labs:  Anion gap --> 19, now 16.   CO2-->  18, now 17  Still acidotic and with open anion gap despite fluid hydration.  Current serum glucose 329.  Some glucose noted in previous UA.  Will check BHA given concern for possible DKA.  Still has symptoms of nausea, well-controlled with IV antiemetic at this time.   Addendum- BHA 1.45.   Will start patient on IV insulin for DKA.  BMP Q4, BHA Q8.      Interventions/ Plan   Above        Anthoney Harada, DNP, [facility] [place]

## 2022-11-28 NOTE — Progress Notes (Signed)
Delay by 30 minutes in recheck of glucose due to emergency with another patient. Likewise, patient lost IV access and D5LR stopped for 30 minutes of time with insulin still at 0.5 U/hr. Patient is asymptomatic with hypoglycemia, but following protocols and DKA order set, D50 given. Monitoring glucose closely and making provider aware.

## 2022-11-29 DIAGNOSIS — R112 Nausea with vomiting, unspecified: Secondary | ICD-10-CM | POA: Diagnosis not present

## 2022-11-29 DIAGNOSIS — E1343 Other specified diabetes mellitus with diabetic autonomic (poly)neuropathy: Secondary | ICD-10-CM | POA: Diagnosis not present

## 2022-11-29 DIAGNOSIS — N179 Acute kidney failure, unspecified: Secondary | ICD-10-CM | POA: Diagnosis not present

## 2022-11-29 LAB — GLUCOSE, CAPILLARY
Glucose-Capillary: 168 mg/dL — ABNORMAL HIGH (ref 70–99)
Glucose-Capillary: 175 mg/dL — ABNORMAL HIGH (ref 70–99)
Glucose-Capillary: 187 mg/dL — ABNORMAL HIGH (ref 70–99)
Glucose-Capillary: 226 mg/dL — ABNORMAL HIGH (ref 70–99)
Glucose-Capillary: 233 mg/dL — ABNORMAL HIGH (ref 70–99)

## 2022-11-29 LAB — BASIC METABOLIC PANEL
Anion gap: 13 (ref 5–15)
BUN: 23 mg/dL — ABNORMAL HIGH (ref 6–20)
CO2: 22 mmol/L (ref 22–32)
Calcium: 9.6 mg/dL (ref 8.9–10.3)
Chloride: 103 mmol/L (ref 98–111)
Creatinine, Ser: 1.33 mg/dL — ABNORMAL HIGH (ref 0.61–1.24)
GFR, Estimated: >60 mL/min (ref >60)
Glucose, Bld: 178 mg/dL — ABNORMAL HIGH (ref 70–99)
Potassium: 4.6 mmol/L (ref 3.5–5.1)
Sodium: 138 mmol/L (ref 135–145)

## 2022-11-29 MED ORDER — GLIPIZIDE 5 MG PO TABS
5.0000 mg | ORAL_TABLET | Freq: Two times a day (BID) | ORAL | Status: DC
  Administered 2022-11-29: 5 mg via ORAL
  Filled 2022-11-29 (×2): qty 1

## 2022-11-29 MED ORDER — LINAGLIPTIN 5 MG PO TABS: 5.0000 mg | ORAL_TABLET | Freq: Every day | ORAL | 3 refills | Status: DC

## 2022-11-29 MED ORDER — GLIPIZIDE 5 MG PO TABS: 5.0000 mg | ORAL_TABLET | Freq: Two times a day (BID) | ORAL | 3 refills | Status: DC

## 2022-11-29 MED ORDER — ONDANSETRON HCL 4 MG PO TABS: 4.0000 mg | ORAL_TABLET | Freq: Four times a day (QID) | ORAL | 0 refills | Status: DC | PRN

## 2022-11-29 MED ORDER — LINAGLIPTIN 5 MG PO TABS
5.0000 mg | ORAL_TABLET | Freq: Every day | ORAL | Status: DC
  Administered 2022-11-29: 5 mg via ORAL
  Filled 2022-11-29: qty 1

## 2022-11-29 NOTE — TOC Initial Note (Signed)
Transition of Care [facility]) - Initial/Assessment Note    Patient Details  Name: James Bradford MRN: [id number] Date of Birth: 14-Feb-1965  Transition of Care [facility]) CM/SW Contact:    Adrian Prows, RN Phone Number: 11/29/2022, 2:29 PM  Clinical Narrative:                 TOC for d/c planning; spoke w/ pt in room; pt says he lives at home; he plans to return at d/c; he denies SDOH risks; pt says he does not have transportation home today; he requested bus pass; pt says he has reading glasses; he does not have HH services, or home oxygen; bus pass given; no TOC needs.  Expected Discharge Plan: Home/Self Care Barriers to Discharge: No Barriers Identified   Patient Goals and CMS Choice Patient states their goals for this hospitalization and ongoing recovery are:: home   Choice offered to / list presented to : NA      Expected Discharge Plan and Services   Discharge Planning Services: CM Consult Post Acute Care Choice: NA Living arrangements for the past 2 months: Apartment Expected Discharge Date: 11/29/22               DME Arranged: N/A DME Agency: NA       HH Arranged: NA HH Agency: NA        Prior Living Arrangements/Services Living arrangements for the past 2 months: Apartment Lives with:: Self Patient language and need for interpreter reviewed:: Yes Do you feel safe going back to the place where you live?: Yes      Need for Family Participation in Patient Care: Yes (Comment) Care giver support system in place?: Yes (comment) Current home services:  (n/a) Criminal Activity/Legal Involvement Pertinent to Current Situation/Hospitalization: No - Comment as needed  Activities of Daily Living   ADL Screening (condition at time of admission) Independently performs ADLs?: Yes (appropriate for developmental age) Is the patient deaf or have difficulty hearing?: No Does the patient have difficulty seeing, even when wearing glasses/contacts?: No Does the patient  have difficulty concentrating, remembering, or making decisions?: No  Permission Sought/Granted Permission sought to share information with : Case Manager Permission granted to share information with : Yes, Verbal Permission Granted  Share Information with NAME: Case Manager     Permission granted to share info w Relationship: Kalo, Dahlke (son) [phone]     Emotional Assessment Appearance:: Appears stated age Attitude/Demeanor/Rapport: Gracious Affect (typically observed): Accepting Orientation: : Oriented to Self, Oriented to Place, Oriented to  Time, Oriented to Situation Alcohol / Substance Use: Not Applicable Psych Involvement: No (comment)  Admission diagnosis:  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] AKI (acute kidney injury) (HCC) [N17.9] Nausea and vomiting, unspecified vomiting type [R11.2] Patient Active Problem List   Diagnosis Date Noted   Diabetic gastroparesis (HCC) 11/27/2022   Uncontrolled type 2 diabetes mellitus with hyperglycemia (HCC) 11/27/2022   Dyslipidemia 11/27/2022   GERD without esophagitis 11/27/2022   Hypoglycemia associated with type 2 diabetes mellitus (HCC) 11/23/2022   Acute kidney injury superimposed on chronic kidney disease stage IIIa (HCC) 06/17/2022   Hyperkalemia 06/17/2022   AKI (acute kidney injury) (HCC) 03/19/2022   Diabetic ketoacidosis without coma associated with type 2 diabetes mellitus (HCC) 03/18/2022   Generalized abdominal pain 03/18/2022   Lactic acidosis 03/18/2022   Alcohol abuse 03/18/2022   Coffee ground emesis 03/18/2022   Nausea and vomiting 09/09/2021   Hyperglycemia 01/12/2021   DKA (diabetic ketoacidosis) (HCC) 01/12/2021  Intractable nausea and vomiting 09/28/2020   Marijuana abuse 09/28/2020   Sinus bradycardia 08/24/2020   Aortic atherosclerosis (HCC) 08/24/2020   Acute kidney injury superimposed on stage 3a chronic kidney disease (HCC) 08/23/2020   Type 2 diabetes mellitus (HCC)  08/23/2020   Normocytic anemia 08/23/2020   Type 2 diabetes mellitus with hyperosmolar hyperglycemic state (HHS) (HCC) 04/23/2019   Hyperlipidemia associated with type 2 diabetes mellitus (HCC)    Stage 3 chronic kidney disease (HCC)    ERECTILE DYSFUNCTION 04/27/2008   Unspecified glaucoma 02/18/2008   Hypertension associated with diabetes (HCC) 02/16/2008   PCP:  [facility]:   [facility] [id number] [place], [place] - [address] [place] [address] Phone: [phone] Fax: [phone]     Social Determinants of Health (SDOH) Social History: SDOH Screenings   Food Insecurity: No Food Insecurity (11/29/2022)  Housing: Low Risk  (11/29/2022)  Transportation Needs: No Transportation Needs (11/29/2022)  Utilities: Not At Risk (11/29/2022)  Financial Resource Strain: Not on File (05/23/2021)   Received from [place], [place]  Physical Activity: Not on File (05/23/2021)   Received from [place], [place]  Social Connections: Not on File (10/18/2022)   Received from [facility]  Stress: Not on File (05/23/2021)   Received from [place], [place]  Tobacco Use: Low Risk  (11/27/2022)   SDOH Interventions: Food Insecurity Interventions: Intervention Not Indicated, Inpatient TOC Housing Interventions: Intervention Not Indicated, Inpatient TOC Transportation Interventions: Bus Pass Given (patient does not have transport home; he requested bus pass) Utilities Interventions: Intervention Not Indicated, Inpatient TOC   Readmission Risk Interventions    03/20/2022   11:05 AM 03/19/2022    9:53 AM 12/13/2021    1:26 PM  Readmission Risk Prevention Plan  Transportation Screening Complete Complete Complete  PCP or Specialist Appt within 3-5 Days Complete Complete Complete  HRI or Home Care Consult Complete Complete Complete  Social Work Consult for Recovery Care Planning/Counseling Complete Complete Complete  Palliative Care  Screening Not Applicable Not Applicable Not Applicable  Medication Review Oceanographer) Complete Complete Complete

## 2022-11-29 NOTE — Discharge Summary (Signed)
Physician Discharge Summary   Patient: James Bradford MRN: [id number] DOB: Dec 26, 1965  Admit date:     11/27/2022  Discharge date: 11/29/22  Discharge Physician: Thad Ranger, MD    PCP: [facility]   Recommendations at discharge:   Glipizide decreased to 5 mg twice daily Started on Tradjenta 5 mg daily  Discharge Diagnoses:    AKI (acute kidney injury) (HCC)   Diabetic gastroparesis (HCC)   Uncontrolled type 2 diabetes mellitus with hyperglycemia (HCC)   Dyslipidemia   GERD without esophagitis   Hospital Course:  Patient is a 57 year old male with HTN, dyslipidemia, marijuana abuse, CKD stage IIaI, DM type II, on oral hypoglycemics presented to ED with acute onset of intractable nausea and vomiting for last 2 to 3 days.  Patient also reported diarrhea on Monday, having chills without reported fever.   In ED, BP 152/76, HR 53 otherwise normal vital signs. Labs showed mild hyponatremia, hypochloremia, CO2 of 18, glucose 230, BUN 42, creatinine 2.26, calcium 10.1 with anion gap of 19.  CBC showed mild anemia.  UA positive for 150 glucose.   Assessment and Plan:    AKI (acute kidney injury) (HCC) superimposed on CKD stage IIIa -Prerenal likely precipitated due to acute nausea vomiting for last 2 to 3 days with poor p.o. intake and diarrhea x 1 day -Presented with creatinine of 2.26.  Baseline creatinine 1.3-1.5  -Patient was placed on IV fluid hydration, currently tolerating diet, creatinine improved to 1.3, at baseline      Uncontrolled type 2 diabetes mellitus with hyperglycemia (HCC), HONK, brittle diabetes with hypoglycemia episode -On admission, noted to be acidotic with elevated anion gap, elevated BHB, UA was negative for ketones -Patient was placed on IV fluids and IV insulin drip -Transitioned to Semglee 5 units daily with sliding scale insulin, however he became hypoglycemic with basal insulin with CBG 25 -Discussed with diabetic coordinator  and the patient, he states he had hypoglycemia and side effects from metformin, insulin in the past.  He was experiencing some hypoglycemia prior to admission. -Reduced home glipizide dose to 5 mg twice daily and started on Tradjenta 5 mg daily -Patient recommended to follow-up with PCP next week     Diabetic gastroparesis (HCC) -Likely the cause of recurrent nausea and vomiting, although can be hyperemesis due to marijuana -Patient was placed on IV Reglan and PPI.  Now improving   Hyponatremia -Likely due to dehydration, #1, continue IV fluid hydration. -Sodium 127 yesterday, improved to 138 -Encourage oral diet     Dyslipidemia -Continue statin       GERD without esophagitis -Continue PPI     Estimated body mass index is 24.3 kg/m as calculated from the following:   Height as of this encounter: 5' 7.01" (1.702 m).   Weight as of this encounter: 70.4 kg.      Pain control - [employer] Controlled Substance Reporting System database was reviewed. and patient was instructed, not to drive, operate heavy machinery, perform activities at heights, swimming or participation in water activities or provide baby-sitting services while on Pain, Sleep and Anxiety Medications; until their outpatient Physician has advised to do so again. Also recommended to not to take more than prescribed Pain, Sleep and Anxiety Medications.  Consultants: None Procedures performed: None Disposition: Home Diet recommendation:  Discharge Diet Orders (From admission, onward)     Start     Ordered   11/29/22 0000  Diet Carb Modified  11/29/22 1333           Carb modified diet DISCHARGE MEDICATION: Allergies as of 11/29/2022       Reactions   Ibuprofen Nausea And Vomiting, Other (See Comments)   Stomach pain and Abdominal pain, too   Pork-derived Products Other (See Comments)   Religious reason   Whole Blood Other (See Comments)   Religious reason   Asa [aspirin] Other (See Comments)    Stomach pain        Medication List     STOP taking these medications    metFORMIN 500 MG tablet Commonly known as: GLUCOPHAGE       TAKE these medications    Dexcom G6 Sensor Misc Inject 1 Device into the skin See admin instructions. Place 1 new sensor into the skin every 10 days   glipiZIDE 5 MG tablet Commonly known as: GLUCOTROL Take 1 tablet (5 mg total) by mouth 2 (two) times daily before a meal. What changed:  medication strength how much to take   KRILL OIL PO Take 1 capsule by mouth daily with breakfast.   linagliptin 5 MG Tabs tablet Commonly known as: TRADJENTA Take 1 tablet (5 mg total) by mouth daily. Start taking on: November 30, 2022   lisinopril 10 MG tablet Commonly known as: ZESTRIL Take 10 mg by mouth daily.   Mens 50+ Multivitamin Tabs Take 1 tablet by mouth daily with breakfast.   ondansetron 4 MG tablet Commonly known as: ZOFRAN Take 1 tablet (4 mg total) by mouth every 6 (six) hours as needed for nausea.   pantoprazole 40 MG tablet Commonly known as: PROTONIX Take 1 tablet (40 mg total) by mouth 2 (two) times daily before a meal.   pravastatin 10 MG tablet Commonly known as: PRAVACHOL Take 1 tablet (10 mg total) by mouth daily. What changed: when to take this   sildenafil 100 MG tablet Commonly known as: VIAGRA Take 100 mg by mouth daily as needed (for E.D.).        Follow-up Information     [facility]. Schedule an appointment as soon as possible for a visit in 1 week(s).   Specialty: Family Medicine Why: for hospital follow-up Contact information: [address] [place] [address] [phone]                Discharge Exam: Ceasar Mons Weights   11/27/22 1706  Weight: 70.4 kg   S: Hypoglycemia overnight down to 25.  This morning basal insulin discontinued.  Started on oral hypoglycemics.  Tolerating diet  BP 94/62   Pulse 74   Temp 98.6 F (37 C) (Oral)   Resp 14   Ht 5' 7.01"  (1.702 m)   Wt 70.4 kg   SpO2 100%   BMI 24.30 kg/m   Physical Exam General: Alert and oriented x 3, NAD Cardiovascular: S1 S2 clear, RRR.  Respiratory: CTAB, no wheezing, rales or rhonchi Gastrointestinal: Soft, nontender, nondistended, NBS Ext: no pedal edema bilaterally Neuro: no new deficits Psych: Normal affect     Condition at discharge: fair  The results of significant diagnostics from this hospitalization (including imaging, microbiology, ancillary and laboratory) are listed below for reference.   Imaging Studies: CT ABDOMEN PELVIS W CONTRAST  Result Date: 11/23/2022 CLINICAL DATA:  Acute, nonlocalized abdominal pain EXAM: CT ABDOMEN AND PELVIS WITH CONTRAST TECHNIQUE: Multidetector CT imaging of the abdomen and pelvis was performed using the standard protocol following bolus administration of intravenous contrast. RADIATION DOSE  REDUCTION: This exam was performed according to the departmental dose-optimization program which includes automated exposure control, adjustment of the mA and/or kV according to patient size and/or use of iterative reconstruction technique. CONTRAST:  75mL OMNIPAQUE IOHEXOL 350 MG/ML SOLN COMPARISON:  03/18/2022 FINDINGS: Lower chest:  No acute finding Hepatobiliary: No focal liver abnormality.No evidence of biliary obstruction or stone. Pancreas: Unremarkable. Spleen: Unremarkable. Adrenals/Urinary Tract: Negative adrenals. No hydronephrosis or stone. Small bilateral renal cystic densities. No superimposed complexity, no follow-up recommended. Unremarkable bladder. Stomach/Bowel:  No obstruction. No visible bowel inflammation. Vascular/Lymphatic: No acute vascular abnormality. No mass or adenopathy. Reproductive:No pathologic findings. Other: No ascites or pneumoperitoneum. Musculoskeletal: No acute abnormalities. IMPRESSION: No acute finding.  No bowel obstruction or visible inflammation. Electronically Signed   By: Tiburcio Pea M.D.   On: 11/23/2022  07:01   US Abdomen Limited RUQ (LIVER/GB)  Result Date: 11/23/2022 CLINICAL DATA:  57 year old male with history of right upper quadrant abdominal pain. Hypoglycemia. EXAM: ULTRASOUND ABDOMEN LIMITED RIGHT UPPER QUADRANT COMPARISON:  None Available. FINDINGS: Gallbladder: No gallstones or wall thickening visualized. No sonographic Murphy sign noted by sonographer. Common bile duct: Diameter: 3.6 mm Liver: No focal lesion identified. Within normal limits in parenchymal echogenicity. Portal vein is patent on color Doppler imaging with normal direction of blood flow towards the liver. Other: None. IMPRESSION: 1. No acute findings to account for the patient's symptoms. Specifically, no evidence of cholelithiasis and no findings to suggest an acute cholecystitis are noted at this time. Electronically Signed   By: Trudie Reed M.D.   On: 11/23/2022 05:42   DG Wrist Complete Right  Result Date: 11/12/2022 CLINICAL DATA:  Pain and swelling after fall. EXAM: RIGHT WRIST - COMPLETE 3+ VIEW COMPARISON:  08/23/2022. FINDINGS: There is no evidence of acute fracture or dislocation. Fixation hardware is noted at the second metacarpal. Mild degenerative changes are noted at the wrist. Soft tissues are unremarkable. IMPRESSION: No acute fracture or dislocation. Electronically Signed   By: Thornell Sartorius M.D.   On: 11/12/2022 04:07    Microbiology: Results for orders placed or performed during the hospital encounter of 11/27/22  MRSA Next Gen by PCR, Nasal     Status: None   Collection Time: 11/28/22 12:46 AM   Specimen: Nasal Mucosa; Nasal Swab  Result Value Ref Range Status   MRSA by PCR Next Gen NOT DETECTED NOT DETECTED Final    Comment: (NOTE) The GeneXpert MRSA Assay (FDA approved for NASAL specimens only), is one component of a comprehensive MRSA colonization surveillance program. It is not intended to diagnose MRSA infection nor to guide or monitor treatment for MRSA infections. Test performance is  not FDA approved in patients less than 1 years old. Performed at [facility], [address] [place], [place] [address]     Labs: CBC: Recent Labs  Lab 11/23/22 0300 11/24/22 0853 11/26/22 0352 11/27/22 1430 11/28/22 0019  WBC 4.4 4.2 5.8 6.7 7.5  NEUTROABS 2.2  --  3.5 5.0  --   HGB 12.2* 11.6* 13.1 12.1* 11.2*  HCT 36.5* 34.4* 40.1 35.8* 33.4*  MCV 94.3 94.0 98.8 95.5 96.8  PLT 326 295 319 326 286   Basic Metabolic Panel: Recent Labs  Lab 11/27/22 1430 11/27/22 2036 11/28/22 0019 11/28/22 0610 11/29/22 0253  NA 132* 127* 134* 133* 138  K 4.2 4.2 4.4 3.9 4.6  CL 95* 94* 102 103 103  CO2 18* 17* 21* 20* 22  GLUCOSE 230* 329* 120* 206* 178*  BUN 42* 36* 33* 30* 23*  CREATININE 2.26* 1.86* 1.63* 1.44* 1.33*  CALCIUM 10.1 8.7* 9.2 8.8* 9.6   Liver Function Tests: Recent Labs  Lab 11/23/22 0300 11/27/22 1430  AST 28 49*  ALT 22 32  ALKPHOS 70 68  BILITOT 0.4 0.9  PROT 7.7 8.8*  ALBUMIN 4.5 5.3*   CBG: Recent Labs  Lab 11/28/22 2344 11/28/22 2359 11/29/22 0202 11/29/22 0732 11/29/22 1138  GLUCAP 25* 175* 168* 233* 226*    Discharge time spent: greater than 30 minutes.  Signed: Thad Ranger, MD [facility] 11/29/2022

## 2022-11-29 NOTE — Progress Notes (Signed)
Hypoglycemic Event  CBG: 25  Treatment: 8 oz juice/soda and D50 50 mL (25 gm)  Symptoms: Sweaty, Shaky, and Nervous/irritable  Follow-up CBG: Time:0000 CBG Result:175  Possible Reasons for Event: Inadequate meal intake and Medication regimen:    Comments/MD notified: Anthoney Harada, NP notified    [employer]

## 2022-11-29 NOTE — Plan of Care (Signed)
Problem: Education: Goal: Knowledge of General Education information will improve Description: Including pain rating scale, medication(s)/side effects and non-pharmacologic comfort measures Outcome: Adequate for Discharge   Problem: Health Behavior/Discharge Planning: Goal: Ability to manage health-related needs will improve Outcome: Adequate for Discharge   Problem: Clinical Measurements: Goal: Ability to maintain clinical measurements within normal limits will improve Outcome: Adequate for Discharge Goal: Will remain free from infection Outcome: Adequate for Discharge Goal: Diagnostic test results will improve Outcome: Adequate for Discharge Goal: Respiratory complications will improve Outcome: Adequate for Discharge Goal: Cardiovascular complication will be avoided Outcome: Adequate for Discharge   Problem: Activity: Goal: Risk for activity intolerance will decrease Outcome: Adequate for Discharge   Problem: Nutrition: Goal: Adequate nutrition will be maintained Outcome: Adequate for Discharge   Problem: Coping: Goal: Level of anxiety will decrease Outcome: Adequate for Discharge   Problem: Elimination: Goal: Will not experience complications related to bowel motility Outcome: Adequate for Discharge Goal: Will not experience complications related to urinary retention Outcome: Adequate for Discharge   Problem: Pain Management: Goal: General experience of comfort will improve Outcome: Adequate for Discharge   Problem: Safety: Goal: Ability to remain free from injury will improve Outcome: Adequate for Discharge   Problem: Skin Integrity: Goal: Risk for impaired skin integrity will decrease Outcome: Adequate for Discharge   Problem: Education: Goal: Ability to describe self-care measures that may prevent or decrease complications (Diabetes Survival Skills Education) will improve Outcome: Adequate for Discharge Goal: Individualized Educational Video(s) Outcome:  Adequate for Discharge   Problem: Coping: Goal: Ability to adjust to condition or change in health will improve Outcome: Adequate for Discharge   Problem: Fluid Volume: Goal: Ability to maintain a balanced intake and output will improve Outcome: Adequate for Discharge   Problem: Health Behavior/Discharge Planning: Goal: Ability to identify and utilize available resources and services will improve Outcome: Adequate for Discharge Goal: Ability to manage health-related needs will improve Outcome: Adequate for Discharge   Problem: Metabolic: Goal: Ability to maintain appropriate glucose levels will improve Outcome: Adequate for Discharge   Problem: Nutritional: Goal: Maintenance of adequate nutrition will improve Outcome: Adequate for Discharge Goal: Progress toward achieving an optimal weight will improve Outcome: Adequate for Discharge   Problem: Skin Integrity: Goal: Risk for impaired skin integrity will decrease Outcome: Adequate for Discharge   Problem: Tissue Perfusion: Goal: Adequacy of tissue perfusion will improve Outcome: Adequate for Discharge   Problem: Education: Goal: Ability to describe self-care measures that may prevent or decrease complications (Diabetes Survival Skills Education) will improve Outcome: Adequate for Discharge Goal: Individualized Educational Video(s) Outcome: Adequate for Discharge   Problem: Cardiac: Goal: Ability to maintain an adequate cardiac output will improve Outcome: Adequate for Discharge   Problem: Health Behavior/Discharge Planning: Goal: Ability to identify and utilize available resources and services will improve Outcome: Adequate for Discharge Goal: Ability to manage health-related needs will improve Outcome: Adequate for Discharge   Problem: Fluid Volume: Goal: Ability to achieve a balanced intake and output will improve Outcome: Adequate for Discharge   Problem: Metabolic: Goal: Ability to maintain appropriate  glucose levels will improve Outcome: Adequate for Discharge   Problem: Nutritional: Goal: Maintenance of adequate nutrition will improve Outcome: Adequate for Discharge Goal: Maintenance of adequate weight for body size and type will improve Outcome: Adequate for Discharge   Problem: Respiratory: Goal: Will regain and/or maintain adequate ventilation Outcome: Adequate for Discharge   Problem: Urinary Elimination: Goal: Ability to achieve and maintain adequate renal perfusion and functioning will improve  Outcome: Adequate for Discharge

## 2022-11-29 NOTE — TOC CM/SW Note (Deleted)
Transition of Care [facility]) - Inpatient Brief Assessment   Patient Details  Name: James Bradford MRN: [id number] Date of Birth: 12-10-1965  Transition of Care [facility]) CM/SW Contact:    Darleene Cleaver, LCSW Phone Number: 11/29/2022, 2:02 PM   Clinical Narrative:  Patient does not have any SDOH needs.  Patient has a PCP, and he also has insurance.  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

## 2022-12-02 ENCOUNTER — Other Ambulatory Visit (HOSPITAL_COMMUNITY): Payer: Self-pay

## 2022-12-02 ENCOUNTER — Telehealth (HOSPITAL_COMMUNITY): Payer: Self-pay | Admitting: Pharmacy Technician

## 2022-12-02 ENCOUNTER — Ambulatory Visit: Payer: BLUE CROSS/BLUE SHIELD | Admitting: Physician Assistant

## 2022-12-02 NOTE — Telephone Encounter (Signed)
Pharmacy Patient Advocate Encounter  Received notification from [facility] that Prior Authorization for Tradjenta 5MG  tablets  has been DENIED.  Full denial letter will be uploaded to the media tab. See denial reason below.   PA #/Case ID/Reference #: [id number]

## 2022-12-02 NOTE — Telephone Encounter (Signed)
Clinical Questions have been Submitted

## 2022-12-02 NOTE — Telephone Encounter (Signed)
Pharmacy Patient Advocate Encounter   Received notification from Fax that prior authorization for Tradjenta 5MG  tablets is required/requested.   Insurance verification completed.   The patient is insured through [facility] .   Per test claim: PA required; PA started via CoverMyMeds. KEY [id number] . Waiting for clinical questions to populate.

## 2022-12-22 ENCOUNTER — Encounter (HOSPITAL_COMMUNITY): Payer: Self-pay | Admitting: Emergency Medicine

## 2022-12-22 ENCOUNTER — Emergency Department (HOSPITAL_COMMUNITY)
Admission: EM | Admit: 2022-12-22 | Discharge: 2022-12-22 | Disposition: A | Discharge status: Home / Self Care | Payer: BLUE CROSS/BLUE SHIELD | Attending: Emergency Medicine | Admitting: Emergency Medicine

## 2022-12-22 DIAGNOSIS — E1122 Type 2 diabetes mellitus with diabetic chronic kidney disease: Secondary | ICD-10-CM | POA: Diagnosis not present

## 2022-12-22 DIAGNOSIS — R112 Nausea with vomiting, unspecified: Secondary | ICD-10-CM | POA: Insufficient documentation

## 2022-12-22 DIAGNOSIS — R739 Hyperglycemia, unspecified: Secondary | ICD-10-CM

## 2022-12-22 DIAGNOSIS — E1165 Type 2 diabetes mellitus with hyperglycemia: Secondary | ICD-10-CM | POA: Insufficient documentation

## 2022-12-22 DIAGNOSIS — Z794 Long term (current) use of insulin: Secondary | ICD-10-CM | POA: Diagnosis not present

## 2022-12-22 DIAGNOSIS — N189 Chronic kidney disease, unspecified: Secondary | ICD-10-CM | POA: Insufficient documentation

## 2022-12-22 LAB — COMPREHENSIVE METABOLIC PANEL
ALT: 18 U/L (ref 0–44)
AST: 20 U/L (ref 15–41)
Albumin: 4.5 g/dL (ref 3.5–5.0)
Alkaline Phosphatase: 52 U/L (ref 38–126)
Anion gap: 11 (ref 5–15)
BUN: 30 mg/dL — ABNORMAL HIGH (ref 6–20)
CO2: 21 mmol/L — ABNORMAL LOW (ref 22–32)
Calcium: 9.3 mg/dL (ref 8.9–10.3)
Chloride: 104 mmol/L (ref 98–111)
Creatinine, Ser: 1.6 mg/dL — ABNORMAL HIGH (ref 0.61–1.24)
GFR, Estimated: 50 mL/min — ABNORMAL LOW (ref >60)
Glucose, Bld: 129 mg/dL — ABNORMAL HIGH (ref 70–99)
Potassium: 3.8 mmol/L (ref 3.5–5.1)
Sodium: 136 mmol/L (ref 135–145)
Total Bilirubin: 0.6 mg/dL (ref <1.2)
Total Protein: 7.8 g/dL (ref 6.5–8.1)

## 2022-12-22 LAB — CBC WITH DIFFERENTIAL/PLATELET
Abs Immature Granulocytes: 0.01 10*3/uL (ref 0.00–0.07)
Basophils Absolute: 0 10*3/uL (ref 0.0–0.1)
Basophils Relative: 0 %
Eosinophils Absolute: 0.1 10*3/uL (ref 0.0–0.5)
Eosinophils Relative: 3 %
HCT: 34 % — ABNORMAL LOW (ref 39.0–52.0)
Hemoglobin: 11.4 g/dL — ABNORMAL LOW (ref 13.0–17.0)
Immature Granulocytes: 0 %
Lymphocytes Relative: 29 %
Lymphs Abs: 1.3 10*3/uL (ref 0.7–4.0)
MCH: 32.9 pg (ref 26.0–34.0)
MCHC: 33.5 g/dL (ref 30.0–36.0)
MCV: 98 fL (ref 80.0–100.0)
Monocytes Absolute: 0.4 10*3/uL (ref 0.1–1.0)
Monocytes Relative: 10 %
Neutro Abs: 2.6 10*3/uL (ref 1.7–7.7)
Neutrophils Relative %: 58 %
Platelets: 320 10*3/uL (ref 150–400)
RBC: 3.47 MIL/uL — ABNORMAL LOW (ref 4.22–5.81)
RDW: 13.4 % (ref 11.5–15.5)
WBC: 4.5 10*3/uL (ref 4.0–10.5)
nRBC: 0 % (ref 0.0–0.2)

## 2022-12-22 LAB — URINALYSIS, W/ REFLEX TO CULTURE (INFECTION SUSPECTED)
Bacteria, UA: NONE SEEN
Bilirubin Urine: NEGATIVE
Glucose, UA: NEGATIVE mg/dL
Ketones, ur: 5 mg/dL — AB
Leukocytes,Ua: NEGATIVE
Nitrite: NEGATIVE
Protein, ur: NEGATIVE mg/dL
Specific Gravity, Urine: 1.015 (ref 1.005–1.030)
pH: 5 (ref 5.0–8.0)

## 2022-12-22 LAB — BLOOD GAS, VENOUS
Acid-base deficit: 3.2 mmol/L — ABNORMAL HIGH (ref 0.0–2.0)
Bicarbonate: 22.7 mmol/L (ref 20.0–28.0)
O2 Saturation: 62.1 %
Patient temperature: 37
pCO2, Ven: 43 mm[Hg] — ABNORMAL LOW (ref 44–60)
pH, Ven: 7.33 (ref 7.25–7.43)
pO2, Ven: 37 mm[Hg] (ref 32–45)

## 2022-12-22 LAB — I-STAT CHEM 8, ED
BUN: 29 mg/dL — ABNORMAL HIGH (ref 6–20)
Calcium, Ion: 1.25 mmol/L (ref 1.15–1.40)
Chloride: 105 mmol/L (ref 98–111)
Creatinine, Ser: 1.6 mg/dL — ABNORMAL HIGH (ref 0.61–1.24)
Glucose, Bld: 123 mg/dL — ABNORMAL HIGH (ref 70–99)
HCT: 36 % — ABNORMAL LOW (ref 39.0–52.0)
Hemoglobin: 12.2 g/dL — ABNORMAL LOW (ref 13.0–17.0)
Potassium: 4.1 mmol/L (ref 3.5–5.1)
Sodium: 139 mmol/L (ref 135–145)
TCO2: 21 mmol/L — ABNORMAL LOW (ref 22–32)

## 2022-12-22 LAB — BETA-HYDROXYBUTYRIC ACID: Beta-Hydroxybutyric Acid: 1.38 mmol/L — ABNORMAL HIGH (ref 0.05–0.27)

## 2022-12-22 LAB — CBG MONITORING, ED: Glucose-Capillary: 131 mg/dL — ABNORMAL HIGH (ref 70–99)

## 2022-12-22 MED ORDER — ONDANSETRON 4 MG PO TBDP: 4.0000 mg | ORAL_TABLET | Freq: Three times a day (TID) | ORAL | 0 refills | Status: DC | PRN

## 2022-12-22 MED ORDER — SODIUM CHLORIDE 0.9 % IV BOLUS
1000.0000 mL | Freq: Once | INTRAVENOUS | Status: AC
  Administered 2022-12-22: 1000 mL via INTRAVENOUS

## 2022-12-22 MED ORDER — ONDANSETRON HCL 4 MG/2ML IJ SOLN
4.0000 mg | Freq: Once | INTRAMUSCULAR | Status: AC
  Administered 2022-12-22: 4 mg via INTRAVENOUS
  Filled 2022-12-22: qty 2

## 2022-12-22 NOTE — ED Triage Notes (Signed)
BIB EMS from home for hyperglycemia which patient reports BS has been in the 400s. Recent change from taking 500 mg metformin to 5 mg farsiga. Patient also endorses nausea, vomiting, and urinary frequency. Hx diabetes, HTN, high cholesterol.

## 2022-12-22 NOTE — Discharge Instructions (Addendum)
Continue monitoring your blood sugar.  Can use zofran as needed for nausea/vomiting. Follow-up with your primary care doctor. Return here for new concerns.

## 2022-12-22 NOTE — ED Provider Notes (Signed)
[place] EMERGENCY DEPARTMENT AT [facility] Provider Note   CSN: [id number] Arrival date & time: 12/22/22  0418     History  Chief Complaint  Patient presents with   Hyperglycemia    James Bradford is a 57 y.o. male.  The history is provided by the patient and medical records.  Hyperglycemia Associated symptoms: increased thirst and polyuria    57 y.o. M with hx of DM2, CKD, alcohol abuse, GERD, presenting to the ED for hyperglycemia.  States sugars have been running 400-500 recently.  He was recently switched from metformin to farxiga-- states PCP was not happy with his recent labs.  He reports some N/V/D, urinary frequency and increased thirst.  Has hx of DKA and concerned for same.    Home Medications Prior to Admission medications   Medication Sig Start Date End Date Taking? Authorizing Provider  Continuous Glucose Sensor (DEXCOM G6 SENSOR) MISC Inject 1 Device into the skin See admin instructions. Place 1 new sensor into the skin every 10 days    [provider]  glipiZIDE (GLUCOTROL) 5 MG tablet Take 1 tablet (5 mg total) by mouth 2 (two) times daily before a meal. 11/29/22   Rai, Ripudeep K, MD  KRILL OIL PO Take 1 capsule by mouth daily with breakfast.    [provider]  linagliptin (TRADJENTA) 5 MG TABS tablet Take 1 tablet (5 mg total) by mouth daily. 11/30/22   Rai, Ripudeep K, MD  lisinopril (ZESTRIL) 10 MG tablet Take 10 mg by mouth daily.    [provider]  Multiple Vitamins-Minerals (MENS 50+ MULTIVITAMIN) TABS Take 1 tablet by mouth daily with breakfast.    [provider]  ondansetron (ZOFRAN) 4 MG tablet Take 1 tablet (4 mg total) by mouth every 6 (six) hours as needed for nausea. 11/29/22   Rai, Delene Ruffini, MD  pantoprazole (PROTONIX) 40 MG tablet Take 1 tablet (40 mg total) by mouth 2 (two) times daily before a meal. 09/05/22   Lanae Boast, MD  pravastatin (PRAVACHOL) 10 MG tablet Take 1 tablet (10 mg total) by  mouth daily. Patient taking differently: Take 10 mg by mouth at bedtime. 03/22/22   Rodolph Bong, MD  sildenafil (VIAGRA) 100 MG tablet Take 100 mg by mouth daily as needed (for E.D.).    [provider]      Allergies    Ibuprofen, Pork-derived products, Whole blood, and Asa [aspirin]    Review of Systems   Review of Systems  Endocrine: Positive for polydipsia and polyuria.  All other systems reviewed and are negative.   Physical Exam Updated Vital Signs BP (!) 154/96 (BP Location: Right Arm)   Pulse 78   Temp 97.7 F (36.5 C) (Oral)   Resp 18   Ht 5\' 7"  (1.702 m)   Wt 78.5 kg   SpO2 100%   BMI 27.10 kg/m   Physical Exam Vitals and nursing note reviewed.  Constitutional:      Appearance: He is well-developed.  HENT:     Head: Normocephalic and atraumatic.     Mouth/Throat:     Comments: Dry mucous membranes Eyes:     Conjunctiva/sclera: Conjunctivae normal.     Pupils: Pupils are equal, round, and reactive to light.  Cardiovascular:     Rate and Rhythm: Normal rate and regular rhythm.     Heart sounds: Normal heart sounds.  Pulmonary:     Effort: Pulmonary effort is normal. No respiratory distress.  Breath sounds: Normal breath sounds. No rhonchi.  Abdominal:     General: Bowel sounds are normal.     Palpations: Abdomen is soft.     Tenderness: There is no abdominal tenderness. There is no rebound.     Comments: Soft, non-tender  Musculoskeletal:        General: Normal range of motion.     Cervical back: Normal range of motion.  Skin:    General: Skin is warm and dry.  Neurological:     Mental Status: He is alert and oriented to person, place, and time.     ED Results / Procedures / Treatments   Labs (all labs ordered are listed, but only abnormal results are displayed) Labs Reviewed  CBC WITH DIFFERENTIAL/PLATELET - Abnormal; Notable for the following components:      Result Value   RBC 3.47 (*)    Hemoglobin 11.4 (*)    HCT 34.0  (*)    All other components within normal limits  COMPREHENSIVE METABOLIC PANEL - Abnormal; Notable for the following components:   CO2 21 (*)    Glucose, Bld 129 (*)    BUN 30 (*)    Creatinine, Ser 1.60 (*)    GFR, Estimated 50 (*)    All other components within normal limits  URINALYSIS, W/ REFLEX TO CULTURE (INFECTION SUSPECTED) - Abnormal; Notable for the following components:   Hgb urine dipstick SMALL (*)    Ketones, ur 5 (*)    All other components within normal limits  BLOOD GAS, VENOUS - Abnormal; Notable for the following components:   pCO2, Ven 43 (*)    Acid-base deficit 3.2 (*)    All other components within normal limits  CBG MONITORING, ED - Abnormal; Notable for the following components:   Glucose-Capillary 131 (*)    All other components within normal limits  I-STAT CHEM 8, ED - Abnormal; Notable for the following components:   BUN 29 (*)    Creatinine, Ser 1.60 (*)    Glucose, Bld 123 (*)    TCO2 21 (*)    Hemoglobin 12.2 (*)    HCT 36.0 (*)    All other components within normal limits  BETA-HYDROXYBUTYRIC ACID    EKG None  Radiology No results found.  Procedures Procedures    Medications Ordered in ED Medications  sodium chloride 0.9 % bolus 1,000 mL (1,000 mLs Intravenous New Bag/Given 12/22/22 0508)  ondansetron (ZOFRAN) injection 4 mg (4 mg Intravenous Given 12/22/22 0509)    ED Course/ Medical Decision Making/ A&P                                 Medical Decision Making Amount and/or Complexity of Data Reviewed Labs: ordered. ECG/medicine tests: ordered and independent interpretation performed.  Risk Prescription drug management.   57 year old male presenting to the ED with hyperglycemia and concerns of DKA.  Recent change to Farxiga from metformin due to continued uncontrolled sugars.  CBG around 400-500 on his Dexcom.  He reports some nausea and vomiting.  He is afebrile and nontoxic.  Mucous membranes are little dry.  His abdomen  is soft and nontender.  Will check labs, venous blood gas.  He is given IV fluids, Zofran.  Labs today are not consistent with DKA-- glucose 129, bicarb 21, gap 11.  SrCr 1.6 today, baseline is around 1.4-1.5.  pH is normal.  He is getting IVF now.  Will  PO challenge.  Anticipate discharge.  6:26 AM Has tolerated PO without difficulty.  Stable for discharge.  Encouraged to continue good oral hydration, monitor glucose closely.  Follow-up with PCP.  Return here for new concerns.  Final Clinical Impression(s) / ED Diagnoses Final diagnoses:  Hyperglycemia  Nausea and vomiting, unspecified vomiting type    Rx / DC Orders ED Discharge Orders          Ordered    ondansetron (ZOFRAN-ODT) 4 MG disintegrating tablet  Every 8 hours PRN        12/22/22 0603              [name], PA-C 12/22/22 1610    Palumbo, April, MD 12/22/22 2352

## 2022-12-22 NOTE — ED Notes (Signed)
6:49 AM  Discharge instructions discussed with patient and patient voices understanding of discharge instructions. Patient is stable at discharge and denies any questions or concerns regarding discharge. Prescription information discussed with patient and he is able to use teach back method to voice understanding. Bus pass given to patient. He denies any further needs at this time. Has steady and equal gait and declines wheelchair.

## 2023-01-12 ENCOUNTER — Other Ambulatory Visit: Payer: Self-pay

## 2023-01-12 ENCOUNTER — Encounter (HOSPITAL_COMMUNITY): Payer: Self-pay

## 2023-01-12 ENCOUNTER — Emergency Department (HOSPITAL_COMMUNITY): Payer: BLUE CROSS/BLUE SHIELD

## 2023-01-12 ENCOUNTER — Emergency Department (HOSPITAL_COMMUNITY)
Admission: EM | Admit: 2023-01-12 | Discharge: 2023-01-12 | Disposition: A | Discharge status: Home / Self Care | Payer: BLUE CROSS/BLUE SHIELD | Attending: Emergency Medicine | Admitting: Emergency Medicine

## 2023-01-12 DIAGNOSIS — R112 Nausea with vomiting, unspecified: Secondary | ICD-10-CM | POA: Diagnosis not present

## 2023-01-12 DIAGNOSIS — R197 Diarrhea, unspecified: Secondary | ICD-10-CM | POA: Insufficient documentation

## 2023-01-12 DIAGNOSIS — R1084 Generalized abdominal pain: Secondary | ICD-10-CM

## 2023-01-12 DIAGNOSIS — R1032 Left lower quadrant pain: Secondary | ICD-10-CM | POA: Insufficient documentation

## 2023-01-12 LAB — CBC WITH DIFFERENTIAL/PLATELET
Abs Immature Granulocytes: 0.01 10*3/uL (ref 0.00–0.07)
Basophils Absolute: 0 10*3/uL (ref 0.0–0.1)
Basophils Relative: 1 %
Eosinophils Absolute: 0.1 10*3/uL (ref 0.0–0.5)
Eosinophils Relative: 4 %
HCT: 35.4 % — ABNORMAL LOW (ref 39.0–52.0)
Hemoglobin: 12 g/dL — ABNORMAL LOW (ref 13.0–17.0)
Immature Granulocytes: 0 %
Lymphocytes Relative: 33 %
Lymphs Abs: 1 10*3/uL (ref 0.7–4.0)
MCH: 33.3 pg (ref 26.0–34.0)
MCHC: 33.9 g/dL (ref 30.0–36.0)
MCV: 98.3 fL (ref 80.0–100.0)
Monocytes Absolute: 0.3 10*3/uL (ref 0.1–1.0)
Monocytes Relative: 9 %
Neutro Abs: 1.6 10*3/uL — ABNORMAL LOW (ref 1.7–7.7)
Neutrophils Relative %: 53 %
Platelets: 273 10*3/uL (ref 150–400)
RBC: 3.6 MIL/uL — ABNORMAL LOW (ref 4.22–5.81)
RDW: 13 % (ref 11.5–15.5)
WBC: 3.1 10*3/uL — ABNORMAL LOW (ref 4.0–10.5)
nRBC: 0 % (ref 0.0–0.2)

## 2023-01-12 LAB — URINALYSIS, ROUTINE W REFLEX MICROSCOPIC
Bilirubin Urine: NEGATIVE
Glucose, UA: 50 mg/dL — AB
Hgb urine dipstick: NEGATIVE
Ketones, ur: 5 mg/dL — AB
Leukocytes,Ua: NEGATIVE
Nitrite: NEGATIVE
Protein, ur: NEGATIVE mg/dL
Specific Gravity, Urine: >1.046 — ABNORMAL HIGH (ref 1.005–1.030)
pH: 5 (ref 5.0–8.0)

## 2023-01-12 LAB — COMPREHENSIVE METABOLIC PANEL
ALT: 18 U/L (ref 0–44)
AST: 21 U/L (ref 15–41)
Albumin: 4.5 g/dL (ref 3.5–5.0)
Alkaline Phosphatase: 51 U/L (ref 38–126)
Anion gap: 11 (ref 5–15)
BUN: 16 mg/dL (ref 6–20)
CO2: 21 mmol/L — ABNORMAL LOW (ref 22–32)
Calcium: 9.3 mg/dL (ref 8.9–10.3)
Chloride: 101 mmol/L (ref 98–111)
Creatinine, Ser: 1.14 mg/dL (ref 0.61–1.24)
GFR, Estimated: >60 mL/min (ref >60)
Glucose, Bld: 197 mg/dL — ABNORMAL HIGH (ref 70–99)
Potassium: 3.6 mmol/L (ref 3.5–5.1)
Sodium: 133 mmol/L — ABNORMAL LOW (ref 135–145)
Total Bilirubin: 0.7 mg/dL (ref <1.2)
Total Protein: 7.9 g/dL (ref 6.5–8.1)

## 2023-01-12 LAB — LIPASE, BLOOD: Lipase: 38 U/L (ref 11–51)

## 2023-01-12 MED ORDER — DICYCLOMINE HCL 10 MG PO CAPS
10.0000 mg | ORAL_CAPSULE | Freq: Once | ORAL | Status: AC
  Administered 2023-01-12: 10 mg via ORAL
  Filled 2023-01-12: qty 1

## 2023-01-12 MED ORDER — DICYCLOMINE HCL 10 MG PO CAPS: 10.0000 mg | ORAL_CAPSULE | Freq: Three times a day (TID) | ORAL | 0 refills | Status: DC

## 2023-01-12 MED ORDER — ONDANSETRON HCL 4 MG/2ML IJ SOLN
4.0000 mg | Freq: Once | INTRAMUSCULAR | Status: AC
  Administered 2023-01-12: 4 mg via INTRAVENOUS
  Filled 2023-01-12: qty 2

## 2023-01-12 MED ORDER — IOHEXOL 300 MG/ML  SOLN
100.0000 mL | Freq: Once | INTRAMUSCULAR | Status: AC | PRN
  Administered 2023-01-12: 100 mL via INTRAVENOUS

## 2023-01-12 NOTE — ED Provider Notes (Signed)
Handoff from R. Browning PA-C, pending CT abd/pelvis to r/o diverticulitis.  Physical Exam  BP (!) 159/82 (BP Location: Left Arm)   Pulse 88   Temp 98.3 F (36.8 C) (Oral)   Resp 18   SpO2 100%   Physical Exam Vitals and nursing note reviewed.  Constitutional:      General: He is not in acute distress.    Appearance: He is well-developed.  HENT:     Head: Normocephalic and atraumatic.  Eyes:     Conjunctiva/sclera: Conjunctivae normal.  Cardiovascular:     Rate and Rhythm: Normal rate and regular rhythm.     Heart sounds: No murmur heard. Pulmonary:     Effort: Pulmonary effort is normal. No respiratory distress.     Breath sounds: Normal breath sounds.  Abdominal:     Palpations: Abdomen is soft.     Tenderness: There is no abdominal tenderness.  Musculoskeletal:        General: No swelling.     Cervical back: Neck supple.  Skin:    General: Skin is warm and dry.     Capillary Refill: Capillary refill takes less than 2 seconds.  Neurological:     Mental Status: He is alert.  Psychiatric:        Mood and Affect: Mood normal.     Procedures  Procedures  ED Course / MDM    Medical Decision Making Patient is a 57 year old male, here for generalized abdominal pain, nausea, vomiting and diarrhea, this been going on since he changed his medications, from metformin to [place].  CT abdomen pelvis, is reassuring no acute abnormalities, informed patient to follow-up on incidental findings.  He is able to eat and drink appropriately.  Feels better after the Bentyl, and Zofran.  Will have him discharged with a few days worth of Bentyl, to use for abdominal spasms.  Discharge with strict return precautions  Amount and/or Complexity of Data Reviewed Labs: ordered.    Details: Reassuring Radiology: ordered.    Details: CT abdomen pelvis shows no acute findings  Risk Prescription drug management.         Pete Pelt, [place] 01/12/23 0932    Arby Barrette,  MD 01/12/23 [phone]

## 2023-01-12 NOTE — Discharge Instructions (Addendum)
Your CT scan was reassuring, and your labs are reassuring as well.  I believe that your diarrhea is likely related to your change of diabetic medication.  Please talk to your primary care doctor in regards to this.  Additionally return if you continue have severe abdominal pain, or worsening pain.  Please follow-up with your primary care doctor, for possible colonoscopy, given your rectal bleeding.

## 2023-01-12 NOTE — ED Triage Notes (Signed)
Pt BIB EMS from home for lower abdominal pain. Pt has been taking diabetic meds for a month and has been having n/v/d since.  238 cbg

## 2023-01-12 NOTE — ED Provider Notes (Signed)
  [facility]-EMERGENCY DEPT [facility] Emergency Department Provider Note MRN:  [id number]  Arrival date & time: 01/12/23     Chief Complaint   Abdominal Pain   History of Present Illness   James Bradford is a 57 y.o. year-old male presents to the ED with chief complaint of abdominal pain.  He states that he has been having nausea, vomiting, and diarrhea.  He reports having some blood in his diarrhea.  He states that he has some pain in the left lower abdomen.  He denies fevers, chills, or cough.  He states that his symptoms started after his PCP changed his meds from Metformin to [place].  History provided by patient.   Review of Systems  Pertinent positive and negative review of systems noted in HPI.    Physical Exam   Vitals:   01/12/23 0515 01/12/23 0520  BP: (!) 159/82 (!) 159/82  Pulse: 88   Resp:  18  Temp:  98.3 F (36.8 C)  SpO2: 100%     CONSTITUTIONAL:  non toxic-appearing, NAD NEURO:  Alert and oriented x 3, CN 3-12 grossly intact EYES:  eyes equal and reactive ENT/NECK:  Supple, no stridor  CARDIO:  normal rate, regular rhythm, appears well-perfused  PULM:  No respiratory distress, CTAB GI/GU:  non-distended, LLQ MSK/SPINE:  No gross deformities, no edema, moves all extremities  SKIN:  no rash, atraumatic   *Additional and/or pertinent findings included in MDM below  Diagnostic and Interventional Summary    EKG Interpretation Date/Time:    Ventricular Rate:    PR Interval:    QRS Duration:    QT Interval:    QTC Calculation:   R Axis:      Text Interpretation:         Labs Reviewed  COMPREHENSIVE METABOLIC PANEL  LIPASE, BLOOD  CBC WITH DIFFERENTIAL/PLATELET  URINALYSIS, ROUTINE W REFLEX MICROSCOPIC    CT ABDOMEN PELVIS W CONTRAST    (Results Pending)    Medications - No data to display   Procedures  /  Critical Care Procedures  ED Course and Medical Decision Making  I have reviewed the triage vital signs, the nursing notes, and  pertinent available records from the EMR.  Social Determinants Affecting Complexity of Care: Patient has no clinically significant social determinants affecting this chief complaint..   ED Course:    Medical Decision Making Amount and/or Complexity of Data Reviewed Labs: ordered. Radiology: ordered.         Consultants:    Treatment and Plan: Patient signed out to oncoming team at shift change.  Plan: Follow-up on CT and remaining labs.    Final Clinical Impressions(s) / ED Diagnoses  No diagnosis found.  ED Discharge Orders     None         Discharge Instructions Discussed with and Provided to Patient:   Discharge Instructions   None      Roxy Horseman, PA-C 01/12/23 0631    Palumbo, April, MD 01/12/23 [phone]

## 2023-01-15 ENCOUNTER — Emergency Department (HOSPITAL_COMMUNITY)
Admission: EM | Admit: 2023-01-15 | Discharge: 2023-01-15 | Disposition: A | Discharge status: Home / Self Care | Payer: BLUE CROSS/BLUE SHIELD | Attending: Emergency Medicine | Admitting: Emergency Medicine

## 2023-01-15 ENCOUNTER — Encounter (HOSPITAL_COMMUNITY): Payer: Self-pay | Admitting: Emergency Medicine

## 2023-01-15 DIAGNOSIS — K625 Hemorrhage of anus and rectum: Secondary | ICD-10-CM | POA: Insufficient documentation

## 2023-01-15 DIAGNOSIS — R197 Diarrhea, unspecified: Secondary | ICD-10-CM | POA: Diagnosis not present

## 2023-01-15 DIAGNOSIS — R112 Nausea with vomiting, unspecified: Secondary | ICD-10-CM | POA: Diagnosis not present

## 2023-01-15 LAB — BASIC METABOLIC PANEL
Anion gap: 9 (ref 5–15)
BUN: 18 mg/dL (ref 6–20)
CO2: 23 mmol/L (ref 22–32)
Calcium: 9.2 mg/dL (ref 8.9–10.3)
Chloride: 106 mmol/L (ref 98–111)
Creatinine, Ser: 1.23 mg/dL (ref 0.61–1.24)
GFR, Estimated: >60 mL/min (ref >60)
Glucose, Bld: 179 mg/dL — ABNORMAL HIGH (ref 70–99)
Potassium: 4.1 mmol/L (ref 3.5–5.1)
Sodium: 138 mmol/L (ref 135–145)

## 2023-01-15 LAB — CBC WITH DIFFERENTIAL/PLATELET
Abs Immature Granulocytes: 0 10*3/uL (ref 0.00–0.07)
Basophils Absolute: 0 10*3/uL (ref 0.0–0.1)
Basophils Relative: 1 %
Eosinophils Absolute: 0.1 10*3/uL (ref 0.0–0.5)
Eosinophils Relative: 4 %
HCT: 33.8 % — ABNORMAL LOW (ref 39.0–52.0)
Hemoglobin: 11 g/dL — ABNORMAL LOW (ref 13.0–17.0)
Immature Granulocytes: 0 %
Lymphocytes Relative: 34 %
Lymphs Abs: 1 10*3/uL (ref 0.7–4.0)
MCH: 32.3 pg (ref 26.0–34.0)
MCHC: 32.5 g/dL (ref 30.0–36.0)
MCV: 99.1 fL (ref 80.0–100.0)
Monocytes Absolute: 0.4 10*3/uL (ref 0.1–1.0)
Monocytes Relative: 13 %
Neutro Abs: 1.4 10*3/uL — ABNORMAL LOW (ref 1.7–7.7)
Neutrophils Relative %: 48 %
Platelets: 276 10*3/uL (ref 150–400)
RBC: 3.41 MIL/uL — ABNORMAL LOW (ref 4.22–5.81)
RDW: 13.1 % (ref 11.5–15.5)
WBC: 2.9 10*3/uL — ABNORMAL LOW (ref 4.0–10.5)
nRBC: 0 % (ref 0.0–0.2)

## 2023-01-15 LAB — CBG MONITORING, ED: Glucose-Capillary: 159 mg/dL — ABNORMAL HIGH (ref 70–99)

## 2023-01-15 MED ORDER — LOPERAMIDE HCL 2 MG PO CAPS: 2.0000 mg | ORAL_CAPSULE | Freq: Four times a day (QID) | ORAL | 0 refills | Status: DC | PRN

## 2023-01-15 MED ORDER — ONDANSETRON HCL 4 MG/2ML IJ SOLN
4.0000 mg | Freq: Once | INTRAMUSCULAR | Status: AC
  Administered 2023-01-15: 4 mg via INTRAVENOUS
  Filled 2023-01-15: qty 2

## 2023-01-15 MED ORDER — LOPERAMIDE HCL 2 MG PO CAPS
4.0000 mg | ORAL_CAPSULE | Freq: Once | ORAL | Status: AC
  Administered 2023-01-15: 4 mg via ORAL
  Filled 2023-01-15: qty 2

## 2023-01-15 MED ORDER — ONDANSETRON 4 MG PO TBDP
ORAL_TABLET | ORAL | 0 refills | Status: AC
Start: 2023-01-15 — End: ?

## 2023-01-15 MED ORDER — HYOSCYAMINE SULFATE 0.125 MG SL SUBL: 0.1250 mg | SUBLINGUAL_TABLET | SUBLINGUAL | 0 refills | Status: DC | PRN

## 2023-01-15 MED ORDER — HYOSCYAMINE SULFATE 0.125 MG PO TABS
0.2500 mg | ORAL_TABLET | Freq: Once | ORAL | Status: AC
  Administered 2023-01-15: 0.25 mg via ORAL
  Filled 2023-01-15: qty 2

## 2023-01-15 NOTE — ED Provider Notes (Signed)
[place] EMERGENCY DEPARTMENT AT [facility] Provider Note   CSN: [id number] Arrival date & time: 01/15/23  2536     History  Chief Complaint  Patient presents with   Abdominal Pain    James Bradford is a 57 y.o. male.  Patient returns with complaints of nausea, vomiting, diarrhea with abdominal pain.  Patient reports that he continues to have diarrhea since he was seen in the ED the other day.  This morning when he wiped there was blood on the tissue which concerned him.  Patient feels like the symptoms began when he was switched from metformin to [place].  He has a doctor's appointment at 1130 today to talk about switching back.       Home Medications Prior to Admission medications   Medication Sig Start Date End Date Taking? Authorizing Provider  hyoscyamine (LEVSIN/SL) 0.125 MG SL tablet Place 1 tablet (0.125 mg total) under the tongue every 4 (four) hours as needed. 01/15/23  Yes Caleigh Rabelo, Canary Brim, MD  loperamide (IMODIUM) 2 MG capsule Take 1 capsule (2 mg total) by mouth 4 (four) times daily as needed for diarrhea or loose stools. 01/15/23  Yes Gilda Crease, MD  ondansetron (ZOFRAN-ODT) 4 MG disintegrating tablet 4mg  ODT q4 hours prn nausea/vomit 01/15/23  Yes Leora Platt, Canary Brim, MD  Continuous Glucose Sensor (DEXCOM G6 SENSOR) MISC Inject 1 Device into the skin See admin instructions. Place 1 new sensor into the skin every 10 days    [provider]  dapagliflozin propanediol (FARXIGA) 5 MG TABS tablet Take 1 tablet by mouth daily. 12/04/22   [provider]  dicyclomine (BENTYL) 10 MG capsule Take 1 capsule (10 mg total) by mouth 4 (four) times daily -  before meals and at bedtime. 01/12/23   Small, Brooke L, PA  glipiZIDE (GLUCOTROL) 5 MG tablet Take 1 tablet (5 mg total) by mouth 2 (two) times daily before a meal. 11/29/22   Rai, Ripudeep K, MD  KRILL OIL PO Take 1 capsule by mouth daily with breakfast.    [provider]  linagliptin (TRADJENTA) 5 MG TABS tablet Take 1 tablet (5 mg total) by mouth daily. Patient not taking: Reported on 12/22/2022 11/30/22   Rai, Delene Ruffini, MD  lisinopril (ZESTRIL) 10 MG tablet Take 10 mg by mouth daily.    [provider]  metFORMIN (GLUCOPHAGE) 500 MG tablet Take 500 mg by mouth 2 (two) times daily. 12/01/22   [provider]  Multiple Vitamins-Minerals (MENS 50+ MULTIVITAMIN) TABS Take 1 tablet by mouth daily with breakfast.    [provider]  pantoprazole (PROTONIX) 40 MG tablet Take 1 tablet (40 mg total) by mouth 2 (two) times daily before a meal. 09/05/22   Lanae Boast, MD  pravastatin (PRAVACHOL) 10 MG tablet Take 1 tablet (10 mg total) by mouth daily. Patient taking differently: Take 10 mg by mouth at bedtime. 03/22/22   Rodolph Bong, MD  sildenafil (VIAGRA) 100 MG tablet Take 100 mg by mouth daily as needed (for E.D.).    [provider]  sitaGLIPtin (JANUVIA) 25 MG tablet Take 25 mg by mouth daily. Patient not taking: Reported on 12/22/2022    [provider]      Allergies    Ibuprofen, Pork-derived products, Whole blood, and Asa [aspirin]    Review of Systems   Review of Systems  Physical Exam Updated Vital Signs BP 132/84   Pulse 78   Temp 98.4 F (36.9 C) (Oral)  Resp 16   Ht 5\' 7"  (1.702 m)   Wt 78.9 kg   SpO2 100%   BMI 27.24 kg/m  Physical Exam Vitals and nursing note reviewed.  Constitutional:      General: He is not in acute distress.    Appearance: He is well-developed.  HENT:     Head: Normocephalic and atraumatic.     Mouth/Throat:     Mouth: Mucous membranes are moist.  Eyes:     General: Vision grossly intact. Gaze aligned appropriately.     Extraocular Movements: Extraocular movements intact.     Conjunctiva/sclera: Conjunctivae normal.  Cardiovascular:     Rate and Rhythm: Normal rate and regular rhythm.     Pulses: Normal pulses.     Heart sounds: Normal heart  sounds, S1 normal and S2 normal. No murmur heard.    No friction rub. No gallop.  Pulmonary:     Effort: Pulmonary effort is normal. No respiratory distress.     Breath sounds: Normal breath sounds.  Abdominal:     Palpations: Abdomen is soft.     Tenderness: There is no abdominal tenderness. There is no guarding or rebound.     Hernia: No hernia is present.  Musculoskeletal:        General: No swelling.     Cervical back: Full passive range of motion without pain, normal range of motion and neck supple. No pain with movement, spinous process tenderness or muscular tenderness. Normal range of motion.     Right lower leg: No edema.     Left lower leg: No edema.  Skin:    General: Skin is warm and dry.     Capillary Refill: Capillary refill takes less than 2 seconds.     Findings: No ecchymosis, erythema, lesion or wound.  Neurological:     Mental Status: He is alert and oriented to person, place, and time.     GCS: GCS eye subscore is 4. GCS verbal subscore is 5. GCS motor subscore is 6.     Cranial Nerves: Cranial nerves 2-12 are intact.     Sensory: Sensation is intact.     Motor: Motor function is intact. No weakness or abnormal muscle tone.     Coordination: Coordination is intact.  Psychiatric:        Mood and Affect: Mood normal.        Speech: Speech normal.        Behavior: Behavior normal.     ED Results / Procedures / Treatments   Labs (all labs ordered are listed, but only abnormal results are displayed) Labs Reviewed  CBC WITH DIFFERENTIAL/PLATELET - Abnormal; Notable for the following components:      Result Value   WBC 2.9 (*)    RBC 3.41 (*)    Hemoglobin 11.0 (*)    HCT 33.8 (*)    Neutro Abs 1.4 (*)    All other components within normal limits  BASIC METABOLIC PANEL - Abnormal; Notable for the following components:   Glucose, Bld 179 (*)    All other components within normal limits  CBG MONITORING, ED - Abnormal; Notable for the following components:    Glucose-Capillary 159 (*)    All other components within normal limits    EKG None  Radiology No results found.  Procedures Procedures    Medications Ordered in ED Medications  hyoscyamine (LEVSIN) tablet 0.25 mg (0.25 mg Oral Given 01/15/23 0612)  ondansetron (ZOFRAN) injection 4 mg (4 mg Intravenous  Given 01/15/23 0609)  loperamide (IMODIUM) capsule 4 mg (4 mg Oral Given 01/15/23 0609)    ED Course/ Medical Decision Making/ A&P                                 Medical Decision Making Amount and/or Complexity of Data Reviewed Labs: ordered.  Risk Prescription drug management.   Presents with complaints of nausea, vomiting, diarrhea.  Patient called an ambulance tonight because he saw blood when he wiped after diarrhea stool.  He reports that he does have a history of peptic ulcer.  No hematemesis or melanotic stools.  Patient appears well.  Vital signs are normal.  He was just here couple of days ago and had a CT that did not show any acute abnormality.  No concern for colitis, diverticulitis.  Abdominal exam is benign.  May have some element of internal hemorrhoids, but will not require further workup.  Patient reports that he has an appointment with his doctor this morning to discuss medication changes.  He thinks the new medication, Marcelline Deist, is causing his GI distress.  Treated symptomatically, discharged encouraged to follow-up with his doctor at this appointment.        Final Clinical Impression(s) / ED Diagnoses Final diagnoses:  Rectal bleeding  Nausea, vomiting and diarrhea    Rx / DC Orders ED Discharge Orders          Ordered    hyoscyamine (LEVSIN/SL) 0.125 MG SL tablet  Every 4 hours PRN        01/15/23 0556    ondansetron (ZOFRAN-ODT) 4 MG disintegrating tablet        01/15/23 0556    loperamide (IMODIUM) 2 MG capsule  4 times daily PRN        01/15/23 0556              Gilda Crease, MD 01/15/23 [phone]

## 2023-01-15 NOTE — ED Triage Notes (Signed)
BIB EMS from hotel with c/o lower quad abdominal pain with nausea, vomiting, diarrhea, and HA progressively worsening since yesterday evening. Patient states that he has noticed blood present in vomit and stool. Hx diabetes. Started on Farsiga approx 1 month ago and reports that he's had multiple episodes since starting medication where he's felt "sick."

## 2023-01-22 ENCOUNTER — Encounter (HOSPITAL_COMMUNITY): Payer: Self-pay

## 2023-01-22 ENCOUNTER — Encounter (HOSPITAL_COMMUNITY): Payer: Self-pay | Admitting: Emergency Medicine

## 2023-01-22 ENCOUNTER — Emergency Department (HOSPITAL_COMMUNITY): Admission: EM | Admit: 2023-01-22 | Payer: BLUE CROSS/BLUE SHIELD | Source: Home / Self Care

## 2023-01-22 NOTE — ED Triage Notes (Addendum)
Patient BIB [facility] from [place]. Patient in police custody. Said he ate heroin and then he said he injected heroin 1 hour ago. Said he feels dizzy and his legs hurt. Left leg is more swollen than the right.    When asked in triage if he did drugs patient said it was baking soda in his drink.

## 2023-02-08 ENCOUNTER — Observation Stay (HOSPITAL_BASED_OUTPATIENT_CLINIC_OR_DEPARTMENT_OTHER)
Admission: EM | Admit: 2023-02-08 | Discharge: 2023-02-10 | Disposition: A | Discharge status: Home / Self Care | Payer: BLUE CROSS/BLUE SHIELD | Attending: Emergency Medicine | Admitting: Emergency Medicine

## 2023-02-08 ENCOUNTER — Encounter (HOSPITAL_COMMUNITY): Payer: Self-pay

## 2023-02-08 ENCOUNTER — Observation Stay (HOSPITAL_COMMUNITY)
Admission: EM | Admit: 2023-02-08 | Discharge: 2023-02-08 | Discharge status: Against Medical Advice | Payer: BLUE CROSS/BLUE SHIELD | Attending: Internal Medicine | Admitting: Internal Medicine

## 2023-02-08 ENCOUNTER — Other Ambulatory Visit: Payer: Self-pay

## 2023-02-08 DIAGNOSIS — Z79899 Other long term (current) drug therapy: Secondary | ICD-10-CM | POA: Insufficient documentation

## 2023-02-08 DIAGNOSIS — E1165 Type 2 diabetes mellitus with hyperglycemia: Secondary | ICD-10-CM | POA: Insufficient documentation

## 2023-02-08 DIAGNOSIS — Z5329 Procedure and treatment not carried out because of patient's decision for other reasons: Secondary | ICD-10-CM | POA: Insufficient documentation

## 2023-02-08 DIAGNOSIS — N179 Acute kidney failure, unspecified: Secondary | ICD-10-CM | POA: Insufficient documentation

## 2023-02-08 DIAGNOSIS — K219 Gastro-esophageal reflux disease without esophagitis: Secondary | ICD-10-CM

## 2023-02-08 DIAGNOSIS — N1831 Chronic kidney disease, stage 3a: Secondary | ICD-10-CM | POA: Insufficient documentation

## 2023-02-08 DIAGNOSIS — I152 Hypertension secondary to endocrine disorders: Secondary | ICD-10-CM | POA: Diagnosis present

## 2023-02-08 DIAGNOSIS — E1122 Type 2 diabetes mellitus with diabetic chronic kidney disease: Secondary | ICD-10-CM | POA: Insufficient documentation

## 2023-02-08 DIAGNOSIS — E1110 Type 2 diabetes mellitus with ketoacidosis without coma: Principal | ICD-10-CM | POA: Insufficient documentation

## 2023-02-08 DIAGNOSIS — F1210 Cannabis abuse, uncomplicated: Secondary | ICD-10-CM | POA: Insufficient documentation

## 2023-02-08 DIAGNOSIS — E785 Hyperlipidemia, unspecified: Secondary | ICD-10-CM | POA: Insufficient documentation

## 2023-02-08 DIAGNOSIS — I129 Hypertensive chronic kidney disease with stage 1 through stage 4 chronic kidney disease, or unspecified chronic kidney disease: Secondary | ICD-10-CM | POA: Insufficient documentation

## 2023-02-08 DIAGNOSIS — Z794 Long term (current) use of insulin: Secondary | ICD-10-CM | POA: Insufficient documentation

## 2023-02-08 DIAGNOSIS — E11649 Type 2 diabetes mellitus with hypoglycemia without coma: Secondary | ICD-10-CM

## 2023-02-08 DIAGNOSIS — Z1152 Encounter for screening for COVID-19: Secondary | ICD-10-CM | POA: Insufficient documentation

## 2023-02-08 DIAGNOSIS — Z139 Encounter for screening, unspecified: Secondary | ICD-10-CM

## 2023-02-08 DIAGNOSIS — E860 Dehydration: Secondary | ICD-10-CM | POA: Diagnosis not present

## 2023-02-08 DIAGNOSIS — R112 Nausea with vomiting, unspecified: Secondary | ICD-10-CM | POA: Diagnosis present

## 2023-02-08 DIAGNOSIS — Z7982 Long term (current) use of aspirin: Secondary | ICD-10-CM | POA: Insufficient documentation

## 2023-02-08 DIAGNOSIS — Z66 Do not resuscitate: Secondary | ICD-10-CM | POA: Insufficient documentation

## 2023-02-08 DIAGNOSIS — E871 Hypo-osmolality and hyponatremia: Secondary | ICD-10-CM | POA: Diagnosis not present

## 2023-02-08 DIAGNOSIS — Z7984 Long term (current) use of oral hypoglycemic drugs: Secondary | ICD-10-CM | POA: Insufficient documentation

## 2023-02-08 DIAGNOSIS — N189 Chronic kidney disease, unspecified: Secondary | ICD-10-CM | POA: Diagnosis present

## 2023-02-08 LAB — BASIC METABOLIC PANEL
Anion gap: 18 — ABNORMAL HIGH (ref 5–15)
Anion gap: 13 (ref 5–15)
Anion gap: 15 (ref 5–15)
BUN: 46 mg/dL — ABNORMAL HIGH (ref 6–20)
BUN: 41 mg/dL — ABNORMAL HIGH (ref 6–20)
BUN: 40 mg/dL — ABNORMAL HIGH (ref 6–20)
CO2: 12 mmol/L — ABNORMAL LOW (ref 22–32)
CO2: 20 mmol/L — ABNORMAL LOW (ref 22–32)
CO2: 18 mmol/L — ABNORMAL LOW (ref 22–32)
Calcium: 9.4 mg/dL (ref 8.9–10.3)
Calcium: 9.3 mg/dL (ref 8.9–10.3)
Calcium: 9.7 mg/dL (ref 8.9–10.3)
Chloride: 105 mmol/L (ref 98–111)
Chloride: 103 mmol/L (ref 98–111)
Chloride: 103 mmol/L (ref 98–111)
Creatinine, Ser: 2.42 mg/dL — ABNORMAL HIGH (ref 0.61–1.24)
Creatinine, Ser: 2.16 mg/dL — ABNORMAL HIGH (ref 0.61–1.24)
Creatinine, Ser: 2.24 mg/dL — ABNORMAL HIGH (ref 0.61–1.24)
GFR, Estimated: 31 mL/min — ABNORMAL LOW (ref >60)
GFR, Estimated: 35 mL/min — ABNORMAL LOW (ref >60)
GFR, Estimated: 34 mL/min — ABNORMAL LOW (ref >60)
Glucose, Bld: 209 mg/dL — ABNORMAL HIGH (ref 70–99)
Glucose, Bld: 90 mg/dL (ref 70–99)
Glucose, Bld: 160 mg/dL — ABNORMAL HIGH (ref 70–99)
Potassium: 4.4 mmol/L (ref 3.5–5.1)
Potassium: 4.3 mmol/L (ref 3.5–5.1)
Potassium: 4 mmol/L (ref 3.5–5.1)
Sodium: 135 mmol/L (ref 135–145)
Sodium: 136 mmol/L (ref 135–145)
Sodium: 136 mmol/L (ref 135–145)

## 2023-02-08 LAB — CBC WITH DIFFERENTIAL/PLATELET
Abs Immature Granulocytes: 0.02 10*3/uL (ref 0.00–0.07)
Basophils Absolute: 0 10*3/uL (ref 0.0–0.1)
Basophils Relative: 1 %
Eosinophils Absolute: 0 10*3/uL (ref 0.0–0.5)
Eosinophils Relative: 1 %
HCT: 36.4 % — ABNORMAL LOW (ref 39.0–52.0)
Hemoglobin: 12.2 g/dL — ABNORMAL LOW (ref 13.0–17.0)
Immature Granulocytes: 0 %
Lymphocytes Relative: 16 %
Lymphs Abs: 1 10*3/uL (ref 0.7–4.0)
MCH: 32.7 pg (ref 26.0–34.0)
MCHC: 33.5 g/dL (ref 30.0–36.0)
MCV: 97.6 fL (ref 80.0–100.0)
Monocytes Absolute: 0.4 10*3/uL (ref 0.1–1.0)
Monocytes Relative: 6 %
Neutro Abs: 4.8 10*3/uL (ref 1.7–7.7)
Neutrophils Relative %: 76 %
Platelets: 251 10*3/uL (ref 150–400)
RBC: 3.73 MIL/uL — ABNORMAL LOW (ref 4.22–5.81)
RDW: 12.6 % (ref 11.5–15.5)
WBC: 6.2 10*3/uL (ref 4.0–10.5)
nRBC: 0 % (ref 0.0–0.2)

## 2023-02-08 LAB — URINALYSIS, ROUTINE W REFLEX MICROSCOPIC
Bacteria, UA: NONE SEEN
Bilirubin Urine: NEGATIVE
Glucose, UA: >=500 mg/dL — AB
Ketones, ur: 20 mg/dL — AB
Leukocytes,Ua: NEGATIVE
Nitrite: NEGATIVE
Protein, ur: 100 mg/dL — AB
Specific Gravity, Urine: 1.012 (ref 1.005–1.030)
pH: 5 (ref 5.0–8.0)

## 2023-02-08 LAB — COMPREHENSIVE METABOLIC PANEL
ALT: 19 U/L (ref 0–44)
AST: 24 U/L (ref 15–41)
Albumin: 5.1 g/dL — ABNORMAL HIGH (ref 3.5–5.0)
Alkaline Phosphatase: 57 U/L (ref 38–126)
Anion gap: 19 — ABNORMAL HIGH (ref 5–15)
BUN: 53 mg/dL — ABNORMAL HIGH (ref 6–20)
CO2: 15 mmol/L — ABNORMAL LOW (ref 22–32)
Calcium: 9.7 mg/dL (ref 8.9–10.3)
Chloride: 99 mmol/L (ref 98–111)
Creatinine, Ser: 3.3 mg/dL — ABNORMAL HIGH (ref 0.61–1.24)
GFR, Estimated: 21 mL/min — ABNORMAL LOW (ref >60)
Glucose, Bld: 296 mg/dL — ABNORMAL HIGH (ref 70–99)
Potassium: 4.7 mmol/L (ref 3.5–5.1)
Sodium: 133 mmol/L — ABNORMAL LOW (ref 135–145)
Total Bilirubin: 1.1 mg/dL (ref 0.0–1.2)
Total Protein: 8.9 g/dL — ABNORMAL HIGH (ref 6.5–8.1)

## 2023-02-08 LAB — CBG MONITORING, ED
Glucose-Capillary: 229 mg/dL — ABNORMAL HIGH (ref 70–99)
Glucose-Capillary: 291 mg/dL — ABNORMAL HIGH (ref 70–99)
Glucose-Capillary: 232 mg/dL — ABNORMAL HIGH (ref 70–99)
Glucose-Capillary: 267 mg/dL — ABNORMAL HIGH (ref 70–99)
Glucose-Capillary: 278 mg/dL — ABNORMAL HIGH (ref 70–99)
Glucose-Capillary: 150 mg/dL — ABNORMAL HIGH (ref 70–99)
Glucose-Capillary: 86 mg/dL (ref 70–99)
Glucose-Capillary: 167 mg/dL — ABNORMAL HIGH (ref 70–99)
Glucose-Capillary: 175 mg/dL — ABNORMAL HIGH (ref 70–99)
Glucose-Capillary: 124 mg/dL — ABNORMAL HIGH (ref 70–99)

## 2023-02-08 LAB — BLOOD GAS, VENOUS
Acid-base deficit: 9.2 mmol/L — ABNORMAL HIGH (ref 0.0–2.0)
Bicarbonate: 16.2 mmol/L — ABNORMAL LOW (ref 20.0–28.0)
O2 Saturation: 50.8 %
Patient temperature: 37
pCO2, Ven: 33 mm[Hg] — ABNORMAL LOW (ref 44–60)
pH, Ven: 7.3 (ref 7.25–7.43)
pO2, Ven: 36 mm[Hg] (ref 32–45)

## 2023-02-08 LAB — BETA-HYDROXYBUTYRIC ACID
Beta-Hydroxybutyric Acid: 5.32 mmol/L — ABNORMAL HIGH (ref 0.05–0.27)
Beta-Hydroxybutyric Acid: 3.31 mmol/L — ABNORMAL HIGH (ref 0.05–0.27)

## 2023-02-08 LAB — LIPASE, BLOOD: Lipase: 54 U/L — ABNORMAL HIGH (ref 11–51)

## 2023-02-08 LAB — TROPONIN I (HIGH SENSITIVITY): Troponin I (High Sensitivity): 4 ng/L (ref <18)

## 2023-02-08 MED ORDER — METOCLOPRAMIDE HCL 5 MG/ML IJ SOLN
10.0000 mg | Freq: Three times a day (TID) | INTRAMUSCULAR | Status: DC
  Administered 2023-02-08: 10 mg via INTRAVENOUS
  Filled 2023-02-08: qty 2

## 2023-02-08 MED ORDER — FENTANYL CITRATE PF 50 MCG/ML IJ SOSY
50.0000 ug | PREFILLED_SYRINGE | Freq: Once | INTRAMUSCULAR | Status: AC
  Administered 2023-02-08: 50 ug via INTRAVENOUS
  Filled 2023-02-08: qty 1

## 2023-02-08 MED ORDER — ACETAMINOPHEN 650 MG RE SUPP: 650.0000 mg | Freq: Four times a day (QID) | RECTAL | Status: DC | PRN

## 2023-02-08 MED ORDER — SODIUM CHLORIDE 0.9 % IV BOLUS: 1000.0000 mL | Freq: Once | INTRAVENOUS | Status: DC

## 2023-02-08 MED ORDER — DEXTROSE IN LACTATED RINGERS 5 % IV SOLN: INTRAVENOUS | Status: DC

## 2023-02-08 MED ORDER — ONDANSETRON 4 MG PO TBDP: 4.0000 mg | ORAL_TABLET | Freq: Once | ORAL | Status: DC | PRN

## 2023-02-08 MED ORDER — ACETAMINOPHEN 325 MG PO TABS: 650.0000 mg | ORAL_TABLET | Freq: Four times a day (QID) | ORAL | Status: DC | PRN

## 2023-02-08 MED ORDER — LACTATED RINGERS IV SOLN: INTRAVENOUS | Status: DC

## 2023-02-08 MED ORDER — ONDANSETRON HCL 4 MG/2ML IJ SOLN
4.0000 mg | Freq: Four times a day (QID) | INTRAMUSCULAR | Status: DC | PRN
  Administered 2023-02-08: 4 mg via INTRAVENOUS
  Filled 2023-02-08: qty 2

## 2023-02-08 MED ORDER — LORAZEPAM 2 MG/ML IJ SOLN
1.0000 mg | Freq: Once | INTRAMUSCULAR | Status: AC
  Administered 2023-02-08: 1 mg via INTRAVENOUS
  Filled 2023-02-08: qty 1

## 2023-02-08 MED ORDER — SODIUM CHLORIDE 0.9 % IV BOLUS
2000.0000 mL | Freq: Once | INTRAVENOUS | Status: AC
  Administered 2023-02-08: 2000 mL via INTRAVENOUS

## 2023-02-08 MED ORDER — POTASSIUM CHLORIDE 10 MEQ/100ML IV SOLN
10.0000 meq | INTRAVENOUS | Status: AC
  Administered 2023-02-08 (×2): 10 meq via INTRAVENOUS
  Filled 2023-02-08 (×2): qty 100

## 2023-02-08 MED ORDER — SODIUM CHLORIDE 0.9 % IV SOLN: 8.0000 mg | Freq: Four times a day (QID) | INTRAVENOUS | Status: DC | PRN

## 2023-02-08 MED ORDER — DEXTROSE 50 % IV SOLN: 0.0000 mL | INTRAVENOUS | Status: DC | PRN

## 2023-02-08 MED ORDER — METOCLOPRAMIDE HCL 5 MG/ML IJ SOLN
10.0000 mg | Freq: Once | INTRAMUSCULAR | Status: AC
  Administered 2023-02-08: 10 mg via INTRAVENOUS
  Filled 2023-02-08: qty 2

## 2023-02-08 MED ORDER — INSULIN REGULAR(HUMAN) IN NACL 100-0.9 UT/100ML-% IV SOLN
INTRAVENOUS | Status: DC
  Administered 2023-02-08: 9.5 [IU]/h via INTRAVENOUS
  Administered 2023-02-08: 3.6 [IU]/h via INTRAVENOUS
  Filled 2023-02-08: qty 100

## 2023-02-08 NOTE — ED Notes (Signed)
 Pt states that he can't pee unless he has something to drink. Pt actively vomiting in triage.

## 2023-02-08 NOTE — ED Triage Notes (Signed)
 Pt left AMA from [facility] where he was being admitted for DKA and had insulin drip on. Pt originally presented for N/V.

## 2023-02-08 NOTE — ED Notes (Signed)
 Patient start yelling  stating  I ve been here for 10 hours and nobody is giving me food. RN stated I will try to ask the Dr. To change your diet and DC your Insulin  drip. Patient start yelling again to RN stating Get out of my room , you're all garbage in here. RN tried to calm patient down and explain what's going on but patient very agitated and slam the door,

## 2023-02-08 NOTE — Discharge Summary (Signed)
 James Bradford FMW:[id number] DOB: 01-26-1966 DOA: 02/08/2023  PCP: [facility]  Admit date: 02/08/2023  Discharge date: 02/08/2023  Admitted From: Home   Disposition: Patient left AMA, would not sign paperwork   Diet Order             Diet NPO time specified  Diet effective now                    Chief Complaint  Patient presents with   Vomiting     Brief history of present illness from the day of admission and additional interim summary      James Bradford is a 58 y.o. male with medical history significant of hypertension, hyperlipidemia, marijuana abuse, CKD stage II-IIIa, type 2 diabetes on oral hypoglycemic agents, diabetic gastroparesis, GERD presented to ED with complaints of acute onset nausea, vomiting, and abdominal pain.  Tachycardic to 120s and hypertensive with blood pressure 160/123 on arrival to the ED.  Afebrile.  Labs showing no leukocytosis, hemoglobin 12.2 (stable), sodium 133, potassium 4.7, bicarb 15, anion gap 19, glucose 296, BUN 53, creatinine 3.3 (baseline 1.2-1.4), normal LFTs, lipase 54, troponin negative, VBG with pH 7.3, beta hydroxybutyric acid 5.32. UA with >500 glucose, 20 ketones, 100 protein, and no signs of infection.  Patient was given fentanyl , Ativan , Reglan , and 2 L normal saline boluses, IV potassium, and started on insulin  drip per DKA protocol.  Tachycardia resolved after IV fluids.  [facility] called to admit.   Patient states he has been taking his diabetes medications metformin  and glipizide  regularly.  He has a Dexcom CGM but ran out of patches a few days ago and has not been able to check his blood sugar.  Since yesterday morning he has vomited multiple times and has not been able to tolerate any p.o. intake.  Also having some mild generalized abdominal  pain.  Reports ongoing marijuana abuse.  Denies fevers, diarrhea, or recent sick contacts.  He was not able to urinate at home but did urinate in the ED after receiving IV fluids.  Denies chest pain or shortness of breath.  No other complaints.                                                                  Hospital Course    Patient was placed on insulin  drip via Glucomander DKA protocol.  Patient's symptoms resolved with resolution of nausea and vomiting.  Electrolytes were improving and his gap was closing.  When it was time to transition him off of insulin  drip onto his usual insulin , patient said he did not want to continue treatment anymore, became abusive to the staff and said he wanted to leave.  Patient refused to sign paperwork although charge nurse did discontinue his IV before patient  walked out.   Discharge diagnosis     Principal Problem:   DKA (diabetic ketoacidosis) (HCC) Active Problems:   Hypertension associated with diabetes (HCC)   Acute kidney injury superimposed on stage 3a chronic kidney disease (HCC)   Intractable nausea and vomiting   AKI (acute kidney injury) (HCC)   Hyponatremia    Discharge instructions      Discharge Medications   Allergies as of 02/08/2023       Reactions   Ibuprofen Nausea And Vomiting, Other (See Comments)   Stomach pain and Abdominal pain, too   Pork-derived Products Other (See Comments)   Religious reason   Whole Blood Other (See Comments)   Religious reason   Asa [aspirin] Other (See Comments)   Stomach pain        Medication List     ASK your doctor about these medications    dapagliflozin propanediol 5 MG Tabs tablet Commonly known as: FARXIGA Take 1 tablet by mouth daily.   dicyclomine  10 MG capsule Commonly known as: BENTYL  Take 1 capsule (10 mg total) by mouth 4 (four) times daily -  before meals and at bedtime.   glipiZIDE  5 MG tablet Commonly known as: GLUCOTROL  Take 1 tablet (5 mg total) by mouth 2  (two) times daily before a meal.   lisinopril  10 MG tablet Commonly known as: ZESTRIL  Take 10 mg by mouth daily.   metFORMIN  500 MG tablet Commonly known as: GLUCOPHAGE  Take 500 mg by mouth 2 (two) times daily.   ondansetron  4 MG disintegrating tablet Commonly known as: ZOFRAN -ODT 4mg  ODT q4 hours prn nausea/vomit   pantoprazole  40 MG tablet Commonly known as: PROTONIX  Take 1 tablet (40 mg total) by mouth 2 (two) times daily before a meal.   pravastatin  10 MG tablet Commonly known as: PRAVACHOL  Take 1 tablet (10 mg total) by mouth daily.   sildenafil 100 MG tablet Commonly known as: VIAGRA Take 100 mg by mouth daily as needed (for E.D.).          Major procedures and Radiology Reports - PLEASE review detailed and final reports thoroughly  -       CT ABDOMEN PELVIS W CONTRAST Result Date: 01/12/2023 CLINICAL DATA:  58 year old male with history of left lower quadrant abdominal pain. EXAM: CT ABDOMEN AND PELVIS WITH CONTRAST TECHNIQUE: Multidetector CT imaging of the abdomen and pelvis was performed using the standard protocol following bolus administration of intravenous contrast. RADIATION DOSE REDUCTION: This exam was performed according to the departmental dose-optimization program which includes automated exposure control, adjustment of the mA and/or kV according to patient size and/or use of iterative reconstruction technique. CONTRAST:  OMNIPAQUE  IOHEXOL  300 MG/ML  SOLN COMPARISON:  CT of the abdomen and pelvis 11/23/2022. FINDINGS: Lower chest: Unremarkable. Hepatobiliary: No suspicious cystic or solid hepatic lesions. No intra or extrahepatic biliary ductal dilatation. Gallbladder is unremarkable in appearance. Pancreas: No pancreatic mass. No pancreatic ductal dilatation. No pancreatic or peripancreatic fluid collections or inflammatory changes. Spleen: Unremarkable. Adrenals/Urinary Tract: Multiple low-attenuation lesions scattered throughout the kidneys  bilaterally, largest of which are compatible with simple cysts, measuring up to 2.6 cm in diameter in the posterior aspect of the interpolar region of the right kidney. The smaller lesions are too small to definitively characterize, but are also favored to represent tiny cysts (no imaging follow-up for these renal lesions is recommended at this time). No aggressive appearing renal lesions are noted. No hydroureteronephrosis. Urinary bladder is unremarkable in appearance. Bilateral adrenal glands are  normal in appearance. Stomach/Bowel: The appearance of the stomach is normal. No pathologic dilatation of small bowel or colon. Normal appendix. Vascular/Lymphatic: Atherosclerosis in the pelvic vasculature. No lymphadenopathy noted in the abdomen or pelvis. Reproductive: Prostate gland and seminal vesicles are unremarkable in appearance. Other: No significant volume of ascites.  No pneumoperitoneum. Musculoskeletal: There are no aggressive appearing lytic or blastic lesions noted in the visualized portions of the skeleton. IMPRESSION: 1. No acute findings are noted in the abdomen or pelvis to account for the patient's symptoms. 2. Atherosclerosis. 3. Additional incidental findings, as above. Electronically Signed   By: Toribio Aye M.D.   On: 01/12/2023 07:14    Micro Results    No results found for this or any previous visit (from the past 240 hours).  Today   Subjective    James Bradford feels much improved since admission.  Feels ready to go home.  Denies chest pain, shortness of breath or abdominal pain.  Feels they can take care of themselves with the resources they have at home.  Objective   Blood pressure (!) 104/92, pulse 88, temperature 98.1 F (36.7 C), resp. rate 18, height 5' 7 (1.702 m), weight 79.4 kg, SpO2 94%.   Intake/Output Summary (Last 24 hours) at 02/08/2023 1711 Last data filed at 02/08/2023 1123 Gross per 24 hour  Intake 143.74 ml  Output 800 ml  Net -656.26 ml     Exam Patient refused exam    Data Review   CBC w Diff:  Lab Results  Component Value Date   WBC 6.2 02/08/2023   HGB 12.2 (L) 02/08/2023   HCT 36.4 (L) 02/08/2023   PLT 251 02/08/2023   LYMPHOPCT 16 02/08/2023   MONOPCT 6 02/08/2023   EOSPCT 1 02/08/2023   BASOPCT 1 02/08/2023    CMP:  Lab Results  Component Value Date   NA 136 02/08/2023   K 4.3 02/08/2023   CL 103 02/08/2023   CO2 20 (L) 02/08/2023   BUN 41 (H) 02/08/2023   CREATININE 2.16 (H) 02/08/2023   PROT 8.9 (H) 02/08/2023   ALBUMIN 5.1 (H) 02/08/2023   BILITOT 1.1 02/08/2023   ALKPHOS 57 02/08/2023   AST 24 02/08/2023   ALT 19 02/08/2023  .   Total Time in preparing paper work, data evaluation and todays exam - 35 minutes  Alayla Dethlefs Tublu Carling Liberman M.D on 02/08/2023 at 5:11 PM  [facility]

## 2023-02-08 NOTE — ED Notes (Signed)
 Pt again asking to speak to charge, charge said she would be out when she had a moment

## 2023-02-08 NOTE — ED Notes (Addendum)
 Patient refused to sign AMA papers. Patient pulling his IV lines. Hospitalist informed. Charge RN informed.

## 2023-02-08 NOTE — ED Notes (Signed)
 Pt upset about wait times, demanding to speak to charge nurse. This dietitian and she responded she would be out to lobby when she had a moment but was currently backed up in EMS triage. Explained to pt that it will be a little bit before CN could come talk to him

## 2023-02-08 NOTE — ED Notes (Signed)
 Patient refusing blood work and requesting to speak to provider. PA at bedside.

## 2023-02-08 NOTE — ED Notes (Signed)
 Pt repositioned in bed, IV unintentionally removed by pt. Pt resting in bed, denies any needs

## 2023-02-08 NOTE — ED Notes (Addendum)
 Went into pts room, introduced myself, and explained to the pt that CBG check was needed. Pt raised his voice at this tech , stating, I asked for ice 10 mins ago! And slammed his phone down. Explain to this pt that this tech just got here and was unaware and to please stop raising voice to me. Pt let me get his CGB. Pt told this tech, I dont want here, do not to come back. RN made aware.

## 2023-02-08 NOTE — ED Provider Notes (Signed)
 [place] EMERGENCY DEPARTMENT AT [facility] Provider Note   CSN: [id number] Arrival date & time: 02/08/23  1704     History  Chief Complaint  Patient presents with   Hyperglycemia    James Bradford is a 58 y.o. male who is known to this provider as I admitted him to [facility] last night.  He left this afternoon AGAINST MEDICAL ADVICE after verbal altercation with ED staff.  States that he left was a long and immediately came to [facility] and check back in as he does want to continue with treatment for his DKA.  Patient is a type II diabetic who presented initially to [facility] for abdominal pain nausea vomiting.  Endorses persistent abdominal pain at this time though he is feeling improved from his initial presentation to [facility] yesterday.  Patient is difficult to redirect, perseverating angrily on his interaction that precipitated his departure from [facility] hospital.  In addition to type 2 diabetes he has CKD stage III, hyperlipidemia, glaucoma.  He is not anticoagulated.  HPI     Home Medications Prior to Admission medications   Medication Sig Start Date End Date Taking? Authorizing Provider  glipiZIDE  (GLUCOTROL ) 5 MG tablet Take 1 tablet (5 mg total) by mouth 2 (two) times daily before a meal. 11/29/22  Yes Rai, Ripudeep K, MD  KRILL OIL PO Take 1 capsule by mouth daily.   Yes [provider]  lisinopril  (ZESTRIL ) 10 MG tablet Take 10 mg by mouth daily.   Yes [provider]  metFORMIN  (GLUCOPHAGE ) 500 MG tablet Take 500 mg by mouth 2 (two) times daily. 12/01/22  Yes [provider]  Multiple Vitamins-Minerals (MENS 50+ MULTIVITAMIN PO) Take 1 tablet by mouth daily.   Yes [provider]  ondansetron  (ZOFRAN -ODT) 4 MG disintegrating tablet 4mg  ODT q4 hours prn nausea/vomit Patient taking differently: Take 4 mg by mouth 2 (two) times daily as needed for nausea or vomiting. 01/15/23  Yes Pollina, Lonni PARAS, MD   pantoprazole  (PROTONIX ) 40 MG tablet Take 1 tablet (40 mg total) by mouth 2 (two) times daily before a meal. 09/05/22  Yes Kc, Mennie, MD  pravastatin  (PRAVACHOL ) 10 MG tablet Take 1 tablet (10 mg total) by mouth daily. Patient taking differently: Take 10 mg by mouth at bedtime. 03/22/22  Yes Sebastian Toribio GAILS, MD  sildenafil (VIAGRA) 100 MG tablet Take 100 mg by mouth daily as needed (for E.D.).   Yes [provider]      Allergies    Ibuprofen, Pork-derived products, Whole blood, and Asa [aspirin]    Review of Systems   Review of Systems  Gastrointestinal:  Positive for abdominal pain, nausea and vomiting.    Physical Exam Updated Vital Signs BP 109/76   Pulse 77   Temp 98.2 F (36.8 C) (Oral)   Resp 16   SpO2 100%  Physical Exam Vitals and nursing note reviewed.  Constitutional:      Appearance: He is not ill-appearing or toxic-appearing.  HENT:     Head: Normocephalic and atraumatic.     Mouth/Throat:     Mouth: Mucous membranes are moist.     Pharynx: No oropharyngeal exudate or posterior oropharyngeal erythema.  Eyes:     General:        Right eye: No discharge.        Left eye: No discharge.     Conjunctiva/sclera: Conjunctivae normal.  Cardiovascular:     Rate and Rhythm: Normal rate and  regular rhythm.     Pulses: Normal pulses.     Heart sounds: Normal heart sounds. No murmur heard. Pulmonary:     Effort: Pulmonary effort is normal. No respiratory distress.     Breath sounds: Normal breath sounds. No wheezing or rales.  Abdominal:     General: Bowel sounds are normal. There is no distension.     Palpations: Abdomen is soft.     Tenderness: There is generalized abdominal tenderness.  Musculoskeletal:        General: No deformity.     Cervical back: Neck supple.  Skin:    General: Skin is warm and dry.  Neurological:     Mental Status: He is alert. Mental status is at baseline.  Psychiatric:        Mood and Affect: Mood normal. Affect is angry.         Behavior: Behavior is uncooperative and aggressive.     ED Results / Procedures / Treatments   Labs (all labs ordered are listed, but only abnormal results are displayed) Labs Reviewed  BETA-HYDROXYBUTYRIC ACID - Abnormal; Notable for the following components:      Result Value   Beta-Hydroxybutyric Acid 3.31 (*)    All other components within normal limits  BASIC METABOLIC PANEL - Abnormal; Notable for the following components:   CO2 18 (*)    Glucose, Bld 160 (*)    BUN 40 (*)    Creatinine, Ser 2.24 (*)    GFR, Estimated 34 (*)    All other components within normal limits  CBC WITH DIFFERENTIAL/PLATELET - Abnormal; Notable for the following components:   RBC 3.74 (*)    Hemoglobin 11.9 (*)    HCT 36.3 (*)    All other components within normal limits  COMPREHENSIVE METABOLIC PANEL - Abnormal; Notable for the following components:   Sodium 132 (*)    CO2 21 (*)    Glucose, Bld 349 (*)    BUN 36 (*)    Creatinine, Ser 1.94 (*)    GFR, Estimated 40 (*)    All other components within normal limits  CBG MONITORING, ED - Abnormal; Notable for the following components:   Glucose-Capillary 124 (*)    All other components within normal limits  I-STAT VENOUS BLOOD GAS, ED - Abnormal; Notable for the following components:   pCO2, Ven 37.3 (*)    pO2, Ven 22 (*)    Sodium 133 (*)    All other components within normal limits  CBG MONITORING, ED - Abnormal; Notable for the following components:   Glucose-Capillary 290 (*)    All other components within normal limits  CBG MONITORING, ED - Abnormal; Notable for the following components:   Glucose-Capillary 114 (*)    All other components within normal limits  CBG MONITORING, ED - Abnormal; Notable for the following components:   Glucose-Capillary 39 (*)    All other components within normal limits  CBG MONITORING, ED - Abnormal; Notable for the following components:   Glucose-Capillary 144 (*)    All other components  within normal limits  RESP PANEL BY RT-PCR (RSV, FLU A&B, COVID)  [id number]  BASIC METABOLIC PANEL    EKG None  Radiology No results found.  Procedures Procedures    Medications Ordered in ED Medications  potassium chloride  10 mEq in 100 mL IVPB (10 mEq Intravenous New Bag/Given 02/09/23 0458)  insulin  glargine-yfgn (SEMGLEE ) injection 5 Units (has no administration in time range)  insulin  aspart (novoLOG ) injection  0-9 Units (has no administration in time range)  insulin  aspart (novoLOG ) injection 3 Units (has no administration in time range)  lactated ringers  infusion ( Intravenous New Bag/Given 02/09/23 0504)  acetaminophen  (TYLENOL ) tablet 650 mg (has no administration in time range)    Or  acetaminophen  (TYLENOL ) suppository 650 mg (has no administration in time range)  ondansetron  (ZOFRAN ) tablet 4 mg (has no administration in time range)    Or  ondansetron  (ZOFRAN ) injection 4 mg (has no administration in time range)  lactated ringers  bolus 1,588 mL (0 mLs Intravenous Stopped 02/09/23 0345)    ED Course/ Medical Decision Making/ A&P Clinical Course as of 02/09/23 0509  Mon Feb 09, 2023  0457 Patient admitted to hospitalist, Dr. Lonzell. I appreciate his collaboration in the care of this patient.  [RS]    Clinical Course User Index [RS] Raynetta Osterloh, Pleasant SAUNDERS, PA-C                                 Medical Decision Making 58 year old male who presents with request for continued treatment for DKA after leaving AMA from [place] long.  Tachycardic and hypertensive on intake, clinically patient appears much improved from my evaluation of him yesterday at [facility] visit.  Cardiopulmonary unremarkable, abdominal exam with mild tenderness tenderness palpation.  Patient angry, verbally aggressive.  Initially refusing treatment and then subsequently agreeing to proceed with treatment so that I can be done with this and never have to come back to [facility] or [employer]  again.    Amount and/or Complexity of Data Reviewed Labs: ordered.    Details: CBC with anemia at patient's baseline.  CMP with hyperglycemia of 349, creatinine of 1.9 increased from patient's baseline of 1.3 but downtrending from initial ED intake yesterday prior to admission.  Pseudohyponatremia in context of hyperglycemia.  No acidosis, no anion gap but elevated beta hydroxybutyric acid to 3.3.   Risk Prescription drug management. Decision regarding hospitalization.    It does appear that patient was insufficiently treated for DKA prior to his departure AMA.  Will reinitiate DKA treatment and repeat admission.  Consult to Dr. Lonzell; patient admitted to medicine service.   [facility] voiced understanding of his medical evaluation and treatment plan. Each of their questions answered to their expressed satisfaction.  Return precautions were given.  Patient is well-appearing, stable, and was discharged in good condition.  This chart was dictated using voice recognition software, Dragon. Despite the best efforts of this provider to proofread and correct errors, errors may still occur which can change documentation meaning.          Final Clinical Impression(s) / ED Diagnoses Final diagnoses:  Diabetic ketoacidosis without coma associated with type 2 diabetes mellitus [facility])    Rx / DC Orders ED Discharge Orders     None         Bobette Pleasant SAUNDERS, PA-C 02/09/23 0510    Franklyn Sid SAILOR, MD 02/11/23 1659

## 2023-02-08 NOTE — H&P (Signed)
 History and Physical    Amadi Frady FMW:[id number] DOB: 1965-12-05 DOA: 02/08/2023  PCP: [facility], [facility]  Patient coming from: Home  Chief Complaint: Vomiting  HPI: James Bradford is a 58 y.o. male with medical history significant of hypertension, hyperlipidemia, marijuana abuse, CKD stage II-IIIa, type 2 diabetes on oral hypoglycemic agents, diabetic gastroparesis, GERD presented to ED with complaints of acute onset nausea, vomiting, and abdominal pain.  Tachycardic to 120s and hypertensive with blood pressure 160/123 on arrival to the ED.  Afebrile.  Labs showing no leukocytosis, hemoglobin 12.2 (stable), sodium 133, potassium 4.7, bicarb 15, anion gap 19, glucose 296, BUN 53, creatinine 3.3 (baseline 1.2-1.4), normal LFTs, lipase 54, troponin negative, VBG with pH 7.3, beta hydroxybutyric acid 5.32. UA with >500 glucose, 20 ketones, 100 protein, and no signs of infection.  Patient was given fentanyl , Ativan , Reglan , and 2 L normal saline boluses, IV potassium, and started on insulin  drip per DKA protocol.  Tachycardia resolved after IV fluids.  [facility] called to admit.  Patient states he has been taking his diabetes medications metformin  and glipizide  regularly.  He has a Dexcom CGM but ran out of patches a few days ago and has not been able to check his blood sugar.  Since yesterday morning he has vomited multiple times and has not been able to tolerate any p.o. intake.  Also having some mild generalized abdominal pain.  Reports ongoing marijuana abuse.  Denies fevers, diarrhea, or recent sick contacts.  He was not able to urinate at home but did urinate in the ED after receiving IV fluids.  Denies chest pain or shortness of breath.  No other complaints.  Review of Systems:  Review of Systems  All other systems reviewed and are negative.   Past Medical History:  Diagnosis Date   Aortic atherosclerosis (HCC) 08/24/2020   Hyperlipidemia    Hypertension    Marijuana abuse  09/28/2020   Sinus bradycardia    Stage 3 chronic kidney disease (HCC)    Type 2 diabetes mellitus (HCC)    Unspecified glaucoma 02/18/2008   Qualifier: Diagnosis of  By: Gladis FNP, Delorise      Past Surgical History:  Procedure Laterality Date   HAND SURGERY     LEG SURGERY       reports that he has never smoked. He has never used smokeless tobacco. He reports current drug use. Drug: Marijuana. He reports that he does not drink alcohol.  Allergies  Allergen Reactions   Ibuprofen Nausea And Vomiting and Other (See Comments)    Stomach pain and Abdominal pain, too   Pork-Derived Products Other (See Comments)    Religious reason   Whole Blood Other (See Comments)    Religious reason   Asa [Aspirin] Other (See Comments)    Stomach pain    Family History  Problem Relation Age of Onset   Hypertension Mother    Pancreatic cancer Father     Prior to Admission medications   Medication Sig Start Date End Date Taking? Authorizing Provider  Continuous Glucose Sensor (DEXCOM G6 SENSOR) MISC Inject 1 Device into the skin See admin instructions. Place 1 new sensor into the skin every 10 days    [provider]  dapagliflozin propanediol (FARXIGA) 5 MG TABS tablet Take 1 tablet by mouth daily. 12/04/22   [provider]  dicyclomine  (BENTYL ) 10 MG capsule Take 1 capsule (10 mg total) by mouth 4 (four) times daily -  before meals and at bedtime.  01/12/23   Small, Brooke L, PA  glipiZIDE  (GLUCOTROL ) 5 MG tablet Take 1 tablet (5 mg total) by mouth 2 (two) times daily before a meal. 11/29/22   Rai, Ripudeep K, MD  hyoscyamine  (LEVSIN AMIEL) 0.125 MG SL tablet Place 1 tablet (0.125 mg total) under the tongue every 4 (four) hours as needed. 01/15/23   Pollina, Lonni PARAS, MD  KRILL OIL PO Take 1 capsule by mouth daily with breakfast.    [provider]  linagliptin  (TRADJENTA ) 5 MG TABS tablet Take 1 tablet (5 mg total) by mouth daily. Patient not taking: Reported on  12/22/2022 11/30/22   Rai, Nydia POUR, MD  lisinopril  (ZESTRIL ) 10 MG tablet Take 10 mg by mouth daily.    [provider]  loperamide  (IMODIUM ) 2 MG capsule Take 1 capsule (2 mg total) by mouth 4 (four) times daily as needed for diarrhea or loose stools. 01/15/23   Haze Lonni PARAS, MD  metFORMIN  (GLUCOPHAGE ) 500 MG tablet Take 500 mg by mouth 2 (two) times daily. 12/01/22   [provider]  Multiple Vitamins-Minerals (MENS 50+ MULTIVITAMIN) TABS Take 1 tablet by mouth daily with breakfast.    [provider]  ondansetron  (ZOFRAN -ODT) 4 MG disintegrating tablet 4mg  ODT q4 hours prn nausea/vomit 01/15/23   Pollina, Lonni PARAS, MD  pantoprazole  (PROTONIX ) 40 MG tablet Take 1 tablet (40 mg total) by mouth 2 (two) times daily before a meal. 09/05/22   Christobal Guadalajara, MD  pravastatin  (PRAVACHOL ) 10 MG tablet Take 1 tablet (10 mg total) by mouth daily. Patient taking differently: Take 10 mg by mouth at bedtime. 03/22/22   Sebastian Toribio GAILS, MD  sildenafil (VIAGRA) 100 MG tablet Take 100 mg by mouth daily as needed (for E.D.).    [provider]  sitaGLIPtin (JANUVIA) 25 MG tablet Take 25 mg by mouth daily. Patient not taking: Reported on 12/22/2022    [provider]    Physical Exam: Vitals:   02/08/23 0315 02/08/23 0408 02/08/23 0415 02/08/23 0430  BP: (!) 132/106  (!) 156/77 (!) 146/90  Pulse: 72  (!) 59 73  Resp: 19  16 13   Temp:  97.9 F (36.6 C)    TempSrc:  Oral    SpO2: 100%  100% 100%  Weight:      Height:        Physical Exam Vitals reviewed.  Constitutional:      General: He is not in acute distress. HENT:     Head: Normocephalic and atraumatic.  Eyes:     Extraocular Movements: Extraocular movements intact.  Cardiovascular:     Rate and Rhythm: Normal rate and regular rhythm.     Pulses: Normal pulses.  Pulmonary:     Effort: Pulmonary effort is normal. No respiratory distress.     Breath sounds: Normal breath sounds. No  wheezing or rales.  Abdominal:     General: Bowel sounds are normal. There is no distension.     Palpations: Abdomen is soft.     Tenderness: There is no abdominal tenderness. There is no guarding or rebound.  Musculoskeletal:     Cervical back: Normal range of motion.     Right lower leg: No edema.     Left lower leg: No edema.  Skin:    General: Skin is warm and dry.  Neurological:     General: No focal deficit present.     Mental Status: He is alert and oriented to person, place, and time.  Labs on Admission: I have personally reviewed following labs and imaging studies  CBC: Recent Labs  Lab 02/08/23 0115  WBC 6.2  NEUTROABS 4.8  HGB 12.2*  HCT 36.4*  MCV 97.6  PLT 251   Basic Metabolic Panel: Recent Labs  Lab 02/08/23 0115  NA 133*  K 4.7  CL 99  CO2 15*  GLUCOSE 296*  BUN 53*  CREATININE 3.30*  CALCIUM 9.7   GFR: Estimated Creatinine Clearance: 25.2 mL/min (A) (by C-G formula based on SCr of 3.3 mg/dL (H)). Liver Function Tests: Recent Labs  Lab 02/08/23 0115  AST 24  ALT 19  ALKPHOS 57  BILITOT 1.1  PROT 8.9*  ALBUMIN 5.1*   Recent Labs  Lab 02/08/23 0115  LIPASE 54*   No results for input(s): AMMONIA in the last 168 hours. Coagulation Profile: No results for input(s): INR, PROTIME in the last 168 hours. Cardiac Enzymes: No results for input(s): CKTOTAL, CKMB, CKMBINDEX, TROPONINI in the last 168 hours. BNP (last 3 results) No results for input(s): PROBNP in the last 8760 hours. HbA1C: No results for input(s): HGBA1C in the last 72 hours. CBG: Recent Labs  Lab 02/08/23 0104 02/08/23 0508  GLUCAP 229* 291*   Lipid Profile: No results for input(s): CHOL, HDL, LDLCALC, TRIG, CHOLHDL, LDLDIRECT in the last 72 hours. Thyroid Function Tests: No results for input(s): TSH, T4TOTAL, FREET4, T3FREE, THYROIDAB in the last 72 hours. Anemia Panel: No results for input(s): VITAMINB12, FOLATE,  FERRITIN, TIBC, IRON, RETICCTPCT in the last 72 hours. Urine analysis:    Component Value Date/Time   COLORURINE YELLOW 02/08/2023 0325   APPEARANCEUR HAZY (A) 02/08/2023 0325   LABSPEC 1.012 02/08/2023 0325   PHURINE 5.0 02/08/2023 0325   GLUCOSEU >=500 (A) 02/08/2023 0325   HGBUR SMALL (A) 02/08/2023 0325   HGBUR negative 02/26/2009 1016   BILIRUBINUR NEGATIVE 02/08/2023 0325   KETONESUR 20 (A) 02/08/2023 0325   PROTEINUR 100 (A) 02/08/2023 0325   UROBILINOGEN 0.2 05/13/2020 1050   NITRITE NEGATIVE 02/08/2023 0325   LEUKOCYTESUR NEGATIVE 02/08/2023 0325    Radiological Exams on Admission: No results found.  EKG: Independently reviewed. Sinus rhythm, nonspecific T wave abnormalities.  No significant change compared to previous EKG.  Assessment and Plan  DKA Patient with type 2 diabetes on oral hypoglycemic agents and last A1c 8.0 on 11/27/2022.  Presenting with nausea and vomiting and found to be in DKA.  Glucose 296, bicarb 15, anion gap 19, UA with ketones, beta hydroxybutyric acid 5.32, VBG with pH 7.3.  Keep NPO.  Patient was given 2 L IV fluid boluses in the ED.  Continue insulin  drip and IV fluids per DKA protocol.  Monitor BMP every 4 hours.  Repeat A1c ordered.  Intractable nausea and vomiting Mild generalized abdominal pain Symptoms likely related to DKA versus diabetic gastroparesis.  Ongoing marijuana abuse also likely contributing.  Abdominal exam benign.  No fever or leukocytosis.  No significant elevation of lipase and LFTs normal.  Continue IV fluid hydration, scheduled Reglan , and antiemetic as needed.  AKI on CKD stage II-IIIa Likely prerenal in etiology from severe dehydration.  BUN 53, creatinine 3.3 (baseline 1.2-1.4).  Continue IV fluid hydration and monitor renal function.  Avoid nephrotoxic agents/hold home lisinopril .  Mild hyponatremia Continue IV fluid hydration and monitor labs.  Hypertension Most recent blood pressure 136/66.  Hold  lisinopril  due to AKI and monitor blood pressure closely.  May need PRN IV meds.  Marijuana abuse Continue to provide counseling.  DVT  prophylaxis: SCDs Code Status: DNR (discussed with the patient) Level of care: Step Down Unit Admission status: It is my clinical opinion that referral for OBSERVATION is reasonable and necessary in this patient based on the above information provided. The aforementioned taken together are felt to place the patient at high risk for further clinical deterioration. However, it is anticipated that the patient may be medically stable for discharge from the hospital within 24 to 48 hours.  Editha Ram MD [facility]  If 7PM-7AM, please contact night-coverage [email or web address]  02/08/2023, 5:41 AM

## 2023-02-08 NOTE — ED Provider Notes (Cosign Needed)
 [place] EMERGENCY DEPARTMENT AT [facility] Provider Note   CSN: [id number] Arrival date & time: 02/08/23  0055     History  Chief Complaint  Patient presents with   Vomiting    James Bradford is a 58 y.o. male presents with N/V/abdominal pain since 7 pm today. Aslo satates he has not urinated since 6 am ~ 20 hours ago. States he feels like this is his diabetic gastroparesis. Zofran  at home without improvement. Patient with hx of poorly controlled DMT2 with gastroparesis, CKD III, marijuana use, hyperlipidemia. No anticoagulation.  HPI     Home Medications Prior to Admission medications   Medication Sig Start Date End Date Taking? Authorizing Provider  Continuous Glucose Sensor (DEXCOM G6 SENSOR) MISC Inject 1 Device into the skin See admin instructions. Place 1 new sensor into the skin every 10 days    [provider]  dapagliflozin propanediol (FARXIGA) 5 MG TABS tablet Take 1 tablet by mouth daily. 12/04/22   [provider]  dicyclomine  (BENTYL ) 10 MG capsule Take 1 capsule (10 mg total) by mouth 4 (four) times daily -  before meals and at bedtime. 01/12/23   Small, Brooke L, PA  glipiZIDE  (GLUCOTROL ) 5 MG tablet Take 1 tablet (5 mg total) by mouth 2 (two) times daily before a meal. 11/29/22   Rai, Ripudeep K, MD  hyoscyamine  (LEVSIN AMIEL) 0.125 MG SL tablet Place 1 tablet (0.125 mg total) under the tongue every 4 (four) hours as needed. 01/15/23   Pollina, Lonni PARAS, MD  KRILL OIL PO Take 1 capsule by mouth daily with breakfast.    [provider]  linagliptin  (TRADJENTA ) 5 MG TABS tablet Take 1 tablet (5 mg total) by mouth daily. Patient not taking: Reported on 12/22/2022 11/30/22   Rai, Nydia POUR, MD  lisinopril  (ZESTRIL ) 10 MG tablet Take 10 mg by mouth daily.    [provider]  loperamide  (IMODIUM ) 2 MG capsule Take 1 capsule (2 mg total) by mouth 4 (four) times daily as needed for diarrhea or loose stools. 01/15/23    Haze Lonni PARAS, MD  metFORMIN  (GLUCOPHAGE ) 500 MG tablet Take 500 mg by mouth 2 (two) times daily. 12/01/22   [provider]  Multiple Vitamins-Minerals (MENS 50+ MULTIVITAMIN) TABS Take 1 tablet by mouth daily with breakfast.    [provider]  ondansetron  (ZOFRAN -ODT) 4 MG disintegrating tablet 4mg  ODT q4 hours prn nausea/vomit 01/15/23   Pollina, Lonni PARAS, MD  pantoprazole  (PROTONIX ) 40 MG tablet Take 1 tablet (40 mg total) by mouth 2 (two) times daily before a meal. 09/05/22   Christobal Guadalajara, MD  pravastatin  (PRAVACHOL ) 10 MG tablet Take 1 tablet (10 mg total) by mouth daily. Patient taking differently: Take 10 mg by mouth at bedtime. 03/22/22   Sebastian Toribio GAILS, MD  sildenafil (VIAGRA) 100 MG tablet Take 100 mg by mouth daily as needed (for E.D.).    [provider]  sitaGLIPtin (JANUVIA) 25 MG tablet Take 25 mg by mouth daily. Patient not taking: Reported on 12/22/2022    [provider]      Allergies    Ibuprofen, Pork-derived products, Whole blood, and Asa [aspirin]    Review of Systems   Review of Systems  Constitutional:  Positive for appetite change and diaphoresis.  HENT: Negative.    Respiratory: Negative.    Cardiovascular: Negative.   Gastrointestinal:  Positive for abdominal pain, nausea and vomiting. Negative for diarrhea.  Genitourinary:  Positive for decreased urine volume.  Physical Exam Updated Vital Signs BP 136/66   Pulse 75   Temp 97.9 F (36.6 C) (Oral)   Resp 17   Ht 5' 7 (1.702 m)   Wt 79.4 kg   SpO2 99%   BMI 27.41 kg/m  Physical Exam Vitals and nursing note reviewed.  Constitutional:      Appearance: He is diaphoretic. He is not toxic-appearing.  HENT:     Head: Normocephalic and atraumatic.     Mouth/Throat:     Mouth: Mucous membranes are moist.     Pharynx: No oropharyngeal exudate or posterior oropharyngeal erythema.  Eyes:     General:        Right eye: No discharge.        Left eye:  No discharge.     Extraocular Movements: Extraocular movements intact.     Conjunctiva/sclera: Conjunctivae normal.     Pupils: Pupils are equal, round, and reactive to light.  Cardiovascular:     Rate and Rhythm: Normal rate and regular rhythm.     Pulses: Normal pulses.     Heart sounds: Normal heart sounds. No murmur heard. Pulmonary:     Effort: Pulmonary effort is normal. No respiratory distress.     Breath sounds: Normal breath sounds. No wheezing or rales.  Abdominal:     General: Bowel sounds are normal. There is no distension.     Palpations: Abdomen is soft.     Tenderness: There is generalized abdominal tenderness. There is no guarding or rebound.     Comments: Vomiting throughout interview  Musculoskeletal:        General: No deformity.     Cervical back: Neck supple.     Right lower leg: No edema.     Left lower leg: No edema.  Skin:    General: Skin is warm.  Neurological:     Mental Status: He is alert. Mental status is at baseline.  Psychiatric:        Mood and Affect: Mood normal.     ED Results / Procedures / Treatments   Labs (all labs ordered are listed, but only abnormal results are displayed) Labs Reviewed  LIPASE, BLOOD - Abnormal; Notable for the following components:      Result Value   Lipase 54 (*)    All other components within normal limits  COMPREHENSIVE METABOLIC PANEL - Abnormal; Notable for the following components:   Sodium 133 (*)    CO2 15 (*)    Glucose, Bld 296 (*)    BUN 53 (*)    Creatinine, Ser 3.30 (*)    Total Protein 8.9 (*)    Albumin 5.1 (*)    GFR, Estimated 21 (*)    Anion gap 19 (*)    All other components within normal limits  URINALYSIS, ROUTINE W REFLEX MICROSCOPIC - Abnormal; Notable for the following components:   APPearance HAZY (*)    Glucose, UA >=500 (*)    Hgb urine dipstick SMALL (*)    Ketones, ur 20 (*)    Protein, ur 100 (*)    All other components within normal limits  CBC WITH  DIFFERENTIAL/PLATELET - Abnormal; Notable for the following components:   RBC 3.73 (*)    Hemoglobin 12.2 (*)    HCT 36.4 (*)    All other components within normal limits  BLOOD GAS, VENOUS - Abnormal; Notable for the following components:   pCO2, Ven 33 (*)    Bicarbonate 16.2 (*)  Acid-base deficit 9.2 (*)    All other components within normal limits  BETA-HYDROXYBUTYRIC ACID - Abnormal; Notable for the following components:   Beta-Hydroxybutyric Acid 5.32 (*)    All other components within normal limits  CBG MONITORING, ED - Abnormal; Notable for the following components:   Glucose-Capillary 229 (*)    All other components within normal limits  CBG MONITORING, ED - Abnormal; Notable for the following components:   Glucose-Capillary 291 (*)    All other components within normal limits  TROPONIN I (HIGH SENSITIVITY)    EKG EKG Interpretation Date/Time:  Sunday February 08 2023 02:05:51 EST Ventricular Rate:  54 PR Interval:  150 QRS Duration:  88 QT Interval:  442 QTC Calculation: 419 R Axis:   55  Text Interpretation: Sinus rhythm borderline T wave abnormalities similar to prior tracings Confirmed by Trine Likes [phone]) on 02/08/2023 3:35:30 AM  Radiology No results found.  Procedures .Critical Care  Performed by: Bobette Pleasant SAUNDERS, PA-C Authorized by: Bobette Pleasant SAUNDERS, PA-C   Critical care provider statement:    Critical care time (minutes):  45   Critical care was time spent personally by me on the following activities:  Development of treatment plan with patient or surrogate, discussions with consultants, evaluation of patient's response to treatment, examination of patient, obtaining history from patient or surrogate, ordering and performing treatments and interventions, ordering and review of laboratory studies, ordering and review of radiographic studies, pulse oximetry and re-evaluation of patient's condition     Medications Ordered in  ED Medications  insulin  regular, human (MYXREDLIN ) 100 units/ 100 mL infusion (9.5 Units/hr Intravenous New Bag/Given 02/08/23 0511)  lactated ringers  infusion ( Intravenous New Bag/Given 02/08/23 0514)  dextrose  5 % in lactated ringers  infusion (0 mLs Intravenous Hold 02/08/23 0454)  dextrose  50 % solution 0-50 mL (has no administration in time range)  potassium chloride  10 mEq in 100 mL IVPB (10 mEq Intravenous New Bag/Given 02/08/23 0516)  metoCLOPramide  (REGLAN ) injection 10 mg (10 mg Intravenous Given 02/08/23 0149)  fentaNYL  (SUBLIMAZE ) injection 50 mcg (50 mcg Intravenous Given 02/08/23 0149)  sodium chloride  0.9 % bolus 2,000 mL (0 mLs Intravenous Stopped 02/08/23 0516)  LORazepam  (ATIVAN ) injection 1 mg (1 mg Intravenous Given 02/08/23 0325)    ED Course/ Medical Decision Making/ A&P Clinical Course as of 02/08/23 0548  Sun Feb 08, 2023  0316 Patient reevaluated, abdominal pain improved after medication, still some residual nausea, and patient feels quite anxious. Will require admission for AKI, will continue to  manage symptoms while in the ED.  [RS]  0539 Admission to Dr. Alfornia, hospitalist. I appreciate her collaboration in the care of this patient.  [RS]    Clinical Course User Index [RS] Bobette Pleasant SAUNDERS, PA-C                                 Medical Decision Making 58 year old male history of diabetes who presents with concern for nausea vomiting abdominal pain.  Hypertensive and tachycardic on intake the patient was vomiting in triage.  Cardiopulmonary exam unremarkable at time of my evaluation.  Abdominal exam with generalized abdominal tenderness palpation without rebound or guarding.  Patient is diaphoretic.  Transported to ED room 3.  Amount and/or Complexity of Data Reviewed Labs: ordered.    Details: CBC with mild anemia with hemoglobin of 12. CMP with AKI with creatinine of 3.3 increased from baseline baseline of 1, anion gap  of 19 with low bicarb of 15.  Due to  hyponatremia 133 with hyperglycemia of 296.  Lipase elevated to 54, troponin negative.  VBG without acidosis. UA with proteinuria, scant hematuria, large glucosuria. BHBA is eleveated to 5    Risk Prescription drug management. Decision regarding hospitalization.   Patient will require admission to the hospital for compensated DKA with AKI . Consulted Dr. Alfornia, hospitalist is agreeable to admit this patient to her service.  Baylen voiced understanding of his medical evaluation and treatment plan. Each of their questions answered to their expressed satisfaction. He is amenable to plan for admission at this time.  This chart was dictated using voice recognition software, Dragon. Despite the best efforts of this provider to proofread and correct errors, errors may still occur which can change documentation meaning.         Final Clinical Impression(s) / ED Diagnoses Final diagnoses:  AKI (acute kidney injury) (HCC)  Diabetic ketoacidosis without coma associated with type 2 diabetes mellitus [facility])    Rx / DC Orders ED Discharge Orders     None         Bobette Pleasant SAUNDERS, PA-C 02/08/23 [phone]

## 2023-02-08 NOTE — ED Notes (Addendum)
 Hospitalist at the bedside

## 2023-02-08 NOTE — ED Notes (Addendum)
 RN tried to check CBG and get his bloodwork done. Patient refused to check CBG. Patient stated " I dont want anyone to check my CBG". Patient raised his middle finger to RN and stated get out on my room. Hospitalist informed. Charge RN informed.

## 2023-02-08 NOTE — ED Triage Notes (Signed)
 Arrives [facility] from home with nausea and vomiting manifesting around 7PM.   Has taken ODT - Zofran with minimal improvement.

## 2023-02-08 NOTE — ED Provider Triage Note (Signed)
 Emergency Medicine Provider Triage Evaluation Note  James Bradford , a 58 y.o. male  was evaluated in triage.  Pt complains of N/V/D, abd pain.  Review of Systems  Positive: Abd pain, N/V/D, chills, Hx: DM, increased thirst Negative: Fever, HA, SOB, CP, lethargy  Physical Exam  BP (!) 135/58 (BP Location: Right Arm)   Pulse (!) 117   Temp 98.2 F (36.8 C)   Resp 18   SpO2 100%  Gen:   Awake, no distress   Resp:  Normal effort  MSK:   Moves extremities without difficulty  Other:    Medical Decision Making  Medically screening exam initiated at 5:38 PM.  Appropriate orders placed.  James Bradford was informed that the remainder of the evaluation will be completed by another provider, this initial triage assessment does not replace that evaluation, and the importance of remaining in the ED until their evaluation is complete.  Labs ordered   Francis Ileana SAILOR, PA-C 02/08/23 1745

## 2023-02-09 ENCOUNTER — Encounter (HOSPITAL_COMMUNITY): Payer: Self-pay | Admitting: Internal Medicine

## 2023-02-09 DIAGNOSIS — E1110 Type 2 diabetes mellitus with ketoacidosis without coma: Secondary | ICD-10-CM

## 2023-02-09 DIAGNOSIS — N189 Chronic kidney disease, unspecified: Secondary | ICD-10-CM

## 2023-02-09 DIAGNOSIS — N179 Acute kidney failure, unspecified: Secondary | ICD-10-CM

## 2023-02-09 LAB — COMPREHENSIVE METABOLIC PANEL
ALT: 18 U/L (ref 0–44)
AST: 22 U/L (ref 15–41)
Albumin: 4.1 g/dL (ref 3.5–5.0)
Alkaline Phosphatase: 56 U/L (ref 38–126)
Anion gap: 10 (ref 5–15)
BUN: 36 mg/dL — ABNORMAL HIGH (ref 6–20)
CO2: 21 mmol/L — ABNORMAL LOW (ref 22–32)
Calcium: 9.4 mg/dL (ref 8.9–10.3)
Chloride: 101 mmol/L (ref 98–111)
Creatinine, Ser: 1.94 mg/dL — ABNORMAL HIGH (ref 0.61–1.24)
GFR, Estimated: 40 mL/min — ABNORMAL LOW (ref >60)
Glucose, Bld: 349 mg/dL — ABNORMAL HIGH (ref 70–99)
Potassium: 4.6 mmol/L (ref 3.5–5.1)
Sodium: 132 mmol/L — ABNORMAL LOW (ref 135–145)
Total Bilirubin: 0.9 mg/dL (ref 0.0–1.2)
Total Protein: 7.3 g/dL (ref 6.5–8.1)

## 2023-02-09 LAB — CBG MONITORING, ED
Glucose-Capillary: 290 mg/dL — ABNORMAL HIGH (ref 70–99)
Glucose-Capillary: 114 mg/dL — ABNORMAL HIGH (ref 70–99)
Glucose-Capillary: 39 mg/dL — CL (ref 70–99)
Glucose-Capillary: 144 mg/dL — ABNORMAL HIGH (ref 70–99)
Glucose-Capillary: 188 mg/dL — ABNORMAL HIGH (ref 70–99)
Glucose-Capillary: 242 mg/dL — ABNORMAL HIGH (ref 70–99)
Glucose-Capillary: 90 mg/dL (ref 70–99)

## 2023-02-09 LAB — RESP PANEL BY RT-PCR (RSV, FLU A&B, COVID)  RVPGX2
Influenza A by PCR: NEGATIVE
Influenza B by PCR: NEGATIVE
Resp Syncytial Virus by PCR: NEGATIVE
SARS Coronavirus 2 by RT PCR: NEGATIVE

## 2023-02-09 LAB — I-STAT VENOUS BLOOD GAS, ED
Acid-base deficit: 2 mmol/L (ref 0.0–2.0)
Bicarbonate: 22.6 mmol/L (ref 20.0–28.0)
Calcium, Ion: 1.17 mmol/L (ref 1.15–1.40)
HCT: 39 % (ref 39.0–52.0)
Hemoglobin: 13.3 g/dL (ref 13.0–17.0)
O2 Saturation: 37 %
Potassium: 4.6 mmol/L (ref 3.5–5.1)
Sodium: 133 mmol/L — ABNORMAL LOW (ref 135–145)
TCO2: 24 mmol/L (ref 22–32)
pCO2, Ven: 37.3 mm[Hg] — ABNORMAL LOW (ref 44–60)
pH, Ven: 7.391 (ref 7.25–7.43)
pO2, Ven: 22 mm[Hg] — CL (ref 32–45)

## 2023-02-09 LAB — BASIC METABOLIC PANEL
Anion gap: 9 (ref 5–15)
BUN: 25 mg/dL — ABNORMAL HIGH (ref 6–20)
CO2: 21 mmol/L — ABNORMAL LOW (ref 22–32)
Calcium: 8 mg/dL — ABNORMAL LOW (ref 8.9–10.3)
Chloride: 107 mmol/L (ref 98–111)
Creatinine, Ser: 1.44 mg/dL — ABNORMAL HIGH (ref 0.61–1.24)
GFR, Estimated: 57 mL/min — ABNORMAL LOW (ref >60)
Glucose, Bld: 240 mg/dL — ABNORMAL HIGH (ref 70–99)
Potassium: 4 mmol/L (ref 3.5–5.1)
Sodium: 137 mmol/L (ref 135–145)

## 2023-02-09 LAB — GLUCOSE, CAPILLARY
Glucose-Capillary: 179 mg/dL — ABNORMAL HIGH (ref 70–99)
Glucose-Capillary: 183 mg/dL — ABNORMAL HIGH (ref 70–99)

## 2023-02-09 LAB — CBC WITH DIFFERENTIAL/PLATELET
Abs Immature Granulocytes: 0.03 10*3/uL (ref 0.00–0.07)
Basophils Absolute: 0 10*3/uL (ref 0.0–0.1)
Basophils Relative: 0 %
Eosinophils Absolute: 0 10*3/uL (ref 0.0–0.5)
Eosinophils Relative: 0 %
HCT: 36.3 % — ABNORMAL LOW (ref 39.0–52.0)
Hemoglobin: 11.9 g/dL — ABNORMAL LOW (ref 13.0–17.0)
Immature Granulocytes: 0 %
Lymphocytes Relative: 15 %
Lymphs Abs: 1.3 10*3/uL (ref 0.7–4.0)
MCH: 31.8 pg (ref 26.0–34.0)
MCHC: 32.8 g/dL (ref 30.0–36.0)
MCV: 97.1 fL (ref 80.0–100.0)
Monocytes Absolute: 0.9 10*3/uL (ref 0.1–1.0)
Monocytes Relative: 10 %
Neutro Abs: 6.5 10*3/uL (ref 1.7–7.7)
Neutrophils Relative %: 75 %
Platelets: 252 10*3/uL (ref 150–400)
RBC: 3.74 MIL/uL — ABNORMAL LOW (ref 4.22–5.81)
RDW: 12.4 % (ref 11.5–15.5)
WBC: 8.7 10*3/uL (ref 4.0–10.5)
nRBC: 0 % (ref 0.0–0.2)

## 2023-02-09 LAB — HEMOGLOBIN A1C
Hgb A1c MFr Bld: 7.9 % — ABNORMAL HIGH (ref 4.8–5.6)
Mean Plasma Glucose: 180 mg/dL

## 2023-02-09 MED ORDER — LACTATED RINGERS IV SOLN: INTRAVENOUS | Status: DC

## 2023-02-09 MED ORDER — INSULIN GLARGINE-YFGN 100 UNIT/ML ~~LOC~~ SOLN
5.0000 [IU] | Freq: Every day | SUBCUTANEOUS | Status: DC
  Administered 2023-02-09 – 2023-02-10 (×2): 5 [IU] via SUBCUTANEOUS
  Filled 2023-02-09 (×2): qty 0.05

## 2023-02-09 MED ORDER — ONDANSETRON HCL 4 MG PO TABS: 4.0000 mg | ORAL_TABLET | Freq: Four times a day (QID) | ORAL | Status: DC | PRN

## 2023-02-09 MED ORDER — ACETAMINOPHEN 325 MG PO TABS: 650.0000 mg | ORAL_TABLET | Freq: Four times a day (QID) | ORAL | Status: DC | PRN

## 2023-02-09 MED ORDER — ONDANSETRON HCL 4 MG/2ML IJ SOLN: 4.0000 mg | Freq: Four times a day (QID) | INTRAMUSCULAR | Status: DC | PRN

## 2023-02-09 MED ORDER — DEXTROSE 50 % IV SOLN
0.0000 mL | INTRAVENOUS | Status: DC | PRN
  Administered 2023-02-09: 50 mL via INTRAVENOUS
  Filled 2023-02-09: qty 50

## 2023-02-09 MED ORDER — INSULIN ASPART 100 UNIT/ML IJ SOLN
0.0000 [IU] | Freq: Three times a day (TID) | INTRAMUSCULAR | Status: DC
  Administered 2023-02-09: 3 [IU] via SUBCUTANEOUS
  Administered 2023-02-09: 2 [IU] via SUBCUTANEOUS
  Administered 2023-02-10: 7 [IU] via SUBCUTANEOUS

## 2023-02-09 MED ORDER — LACTATED RINGERS IV SOLN: INTRAVENOUS | Status: AC

## 2023-02-09 MED ORDER — ACETAMINOPHEN 650 MG RE SUPP
650.0000 mg | Freq: Four times a day (QID) | RECTAL | Status: DC | PRN
Start: 2023-02-09 — End: 2023-02-10

## 2023-02-09 MED ORDER — INSULIN REGULAR(HUMAN) IN NACL 100-0.9 UT/100ML-% IV SOLN
INTRAVENOUS | Status: DC
  Administered 2023-02-09: 11 [IU]/h via INTRAVENOUS
  Filled 2023-02-09: qty 100

## 2023-02-09 MED ORDER — INSULIN ASPART 100 UNIT/ML IJ SOLN
3.0000 [IU] | Freq: Three times a day (TID) | INTRAMUSCULAR | Status: DC
  Administered 2023-02-09 – 2023-02-10 (×2): 3 [IU] via SUBCUTANEOUS

## 2023-02-09 MED ORDER — POTASSIUM CHLORIDE 10 MEQ/100ML IV SOLN
10.0000 meq | INTRAVENOUS | Status: AC
  Administered 2023-02-09 (×2): 10 meq via INTRAVENOUS
  Filled 2023-02-09 (×2): qty 100

## 2023-02-09 MED ORDER — DEXTROSE IN LACTATED RINGERS 5 % IV SOLN: INTRAVENOUS | Status: DC

## 2023-02-09 MED ORDER — LACTATED RINGERS IV BOLUS
20.0000 mL/kg | Freq: Once | INTRAVENOUS | Status: AC
  Administered 2023-02-09: 1588 mL via INTRAVENOUS

## 2023-02-09 NOTE — Assessment & Plan Note (Signed)
 Likely pre-renal in setting of DKA. Renal fxn already improving Creat 3 -> 1.9 overnight. But not baseline yet. Cont IVF Repeat BMP in AM tomorrow

## 2023-02-09 NOTE — Assessment & Plan Note (Signed)
 DKA resolved at time of admission. Transitioning pt to [place] insulin and advancing diet: Lantus 5u daily Sensitive SSI AC/HS Holding home metformin and glipizide due to AKI

## 2023-02-09 NOTE — H&P (Signed)
 History and Physical    Patient: James Bradford FMW:[id number] DOB: 12/12/1965 DOA: 02/08/2023 DOS: the patient was seen and examined on 02/09/2023 PCP: Medicine, [facility]  Patient coming from: Home  Chief Complaint:  Chief Complaint  Patient presents with   Hyperglycemia   HPI: [name] is a 58 y.o. male with medical history significant of DM2, CKD3, HTN, HLD, marijuana abuse.  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  Pt admitted to our service yesterday for DKA + AKI.  AKI improving and DKA had resolved, was transitioning to [place] insulin , but apparently had disagreement with staff and so left AMA.  Came over to ED here at [facility].    Review of Systems: As mentioned in the history of present illness. All other systems reviewed and are negative. Past Medical History:  Diagnosis Date   Aortic atherosclerosis (HCC) 08/24/2020   Hyperlipidemia    Hypertension    Marijuana abuse 09/28/2020   Sinus bradycardia    Stage 3 chronic kidney disease (HCC)    Type 2 diabetes mellitus (HCC)    Unspecified glaucoma 02/18/2008   Qualifier: Diagnosis of  By: Gladis FNP, Delorise     Past Surgical History:  Procedure Laterality Date   HAND SURGERY     LEG SURGERY     Social History:  reports that he has never smoked. He has never used smokeless tobacco. He reports current drug use. Drug: Marijuana. He reports that he does not drink alcohol.  Allergies  Allergen Reactions   Ibuprofen Nausea And Vomiting and Other (See Comments)    Stomach pain and Abdominal pain, too   Pork-Derived Products Other (See Comments)    Religious reason   Whole Blood Other (See Comments)    Religious reason   Asa [Aspirin] Other (See Comments)    Stomach pain    Family History  Problem Relation Age of Onset   Hypertension Mother    Pancreatic cancer Father     Prior to Admission medications   Medication Sig Start Date End Date Taking? Authorizing Provider   glipiZIDE  (GLUCOTROL ) 5 MG tablet Take 1 tablet (5 mg total) by mouth 2 (two) times daily before a meal. 11/29/22  Yes Rai, Ripudeep K, MD  KRILL OIL PO Take 1 capsule by mouth daily.   Yes [provider]  lisinopril  (ZESTRIL ) 10 MG tablet Take 10 mg by mouth daily.   Yes [provider]  metFORMIN  (GLUCOPHAGE ) 500 MG tablet Take 500 mg by mouth 2 (two) times daily. 12/01/22  Yes [provider]  Multiple Vitamins-Minerals (MENS 50+ MULTIVITAMIN PO) Take 1 tablet by mouth daily.   Yes [provider]  ondansetron  (ZOFRAN -ODT) 4 MG disintegrating tablet 4mg  ODT q4 hours prn nausea/vomit Patient taking differently: Take 4 mg by mouth 2 (two) times daily as needed for nausea or vomiting. 01/15/23  Yes Pollina, Lonni PARAS, MD  pantoprazole  (PROTONIX ) 40 MG tablet Take 1 tablet (40 mg total) by mouth 2 (two) times daily before a meal. 09/05/22  Yes Kc, Mennie, MD  pravastatin  (PRAVACHOL ) 10 MG tablet Take 1 tablet (10 mg total) by mouth daily. Patient taking differently: Take 10 mg by mouth at bedtime. 03/22/22  Yes Sebastian Toribio GAILS, MD  sildenafil (VIAGRA) 100 MG tablet Take 100 mg by mouth daily as needed (for E.D.).   Yes [provider]    Physical Exam: Vitals:   02/08/23 1712 02/08/23 2315 02/09/23 0320 02/09/23 0500  BP: (!) 135/58 124/66  110/79 109/76  Pulse: (!) 117 82 82 77  Resp: 18 20 12 16   Temp: 98.2 F (36.8 C) 100 F (37.8 C) 98.2 F (36.8 C)   TempSrc:  Oral Oral   SpO2: 100% 100% 100% 100%   Constitutional: NAD, calm, comfortable Respiratory: clear to auscultation bilaterally, no wheezing, no crackles. Normal respiratory effort. No accessory muscle use.  Cardiovascular: Regular rate and rhythm, no murmurs / rubs / gallops. No extremity edema. 2+ pedal pulses. No carotid bruits.  Abdomen: no tenderness, no masses palpated. No hepatosplenomegaly. Bowel sounds positive. Neurologic: CN 2-12 grossly intact. Sensation intact, DTR  normal. Strength 5/5 in all 4.  Psychiatric: Normal judgment and insight. Alert and oriented x 3. Normal mood.   Data Reviewed:    Labs on Admission: I have personally reviewed following labs and imaging studies  CBC: Recent Labs  Lab 02/08/23 0115 02/09/23 0000 02/09/23 0006  WBC 6.2 8.7  --   NEUTROABS 4.8 6.5  --   HGB 12.2* 11.9* 13.3  HCT 36.4* 36.3* 39.0  MCV 97.6 97.1  --   PLT 251 252  --    Basic Metabolic Panel: Recent Labs  Lab 02/08/23 0115 02/08/23 0633 02/08/23 1143 02/08/23 1801 02/09/23 0000 02/09/23 0006  NA 133* 135 136 136 132* 133*  K 4.7 4.4 4.3 4.0 4.6 4.6  CL 99 105 103 103 101  --   CO2 15* 12* 20* 18* 21*  --   GLUCOSE 296* 209* 90 160* 349*  --   BUN 53* 46* 41* 40* 36*  --   CREATININE 3.30* 2.42* 2.16* 2.24* 1.94*  --   CALCIUM 9.7 9.4 9.3 9.7 9.4  --    GFR: Estimated Creatinine Clearance: 42.9 mL/min (A) (by C-G formula based on SCr of 1.94 mg/dL (H)). Liver Function Tests: Recent Labs  Lab 02/08/23 0115 02/09/23 0000  AST 24 22  ALT 19 18  ALKPHOS 57 56  BILITOT 1.1 0.9  PROT 8.9* 7.3  ALBUMIN 5.1* 4.1   Recent Labs  Lab 02/08/23 0115  LIPASE 54*   No results for input(s): AMMONIA in the last 168 hours. Coagulation Profile: No results for input(s): INR, PROTIME in the last 168 hours. Cardiac Enzymes: No results for input(s): CKTOTAL, CKMB, CKMBINDEX, TROPONINI in the last 168 hours. BNP (last 3 results) No results for input(s): PROBNP in the last 8760 hours. HbA1C: No results for input(s): HGBA1C in the last 72 hours. CBG: Recent Labs  Lab 02/08/23 1738 02/09/23 0157 02/09/23 0316 02/09/23 0416 02/09/23 0445  GLUCAP 124* 290* 114* 39* 144*   Lipid Profile: No results for input(s): CHOL, HDL, LDLCALC, TRIG, CHOLHDL, LDLDIRECT in the last 72 hours. Thyroid Function Tests: No results for input(s): TSH, T4TOTAL, FREET4, T3FREE, THYROIDAB in the last 72 hours. Anemia  Panel: No results for input(s): VITAMINB12, FOLATE, FERRITIN, TIBC, IRON, RETICCTPCT in the last 72 hours. Urine analysis:    Component Value Date/Time   COLORURINE YELLOW 02/08/2023 0325   APPEARANCEUR HAZY (A) 02/08/2023 0325   LABSPEC 1.012 02/08/2023 0325   PHURINE 5.0 02/08/2023 0325   GLUCOSEU >=500 (A) 02/08/2023 0325   HGBUR SMALL (A) 02/08/2023 0325   HGBUR negative 02/26/2009 1016   BILIRUBINUR NEGATIVE 02/08/2023 0325   KETONESUR 20 (A) 02/08/2023 0325   PROTEINUR 100 (A) 02/08/2023 0325   UROBILINOGEN 0.2 05/13/2020 1050   NITRITE NEGATIVE 02/08/2023 0325   LEUKOCYTESUR NEGATIVE 02/08/2023 0325    Radiological Exams on Admission: No results found.  EKG:  Independently reviewed.   Assessment and Plan: Acute kidney injury superimposed on chronic kidney disease stage IIIa (HCC) Likely pre-renal in setting of DKA. Renal fxn already improving Creat 3 -> 1.9 overnight. But not baseline yet. Cont IVF Repeat BMP in AM tomorrow  Diabetic ketoacidosis without coma associated with type 2 diabetes mellitus (HCC) DKA resolved at time of admission. Transitioning pt to [place] insulin  and advancing diet: Lantus  5u daily Sensitive SSI AC/HS Holding home metformin  and glipizide  due to AKI      Advance Care Planning:   Code Status: Do not attempt resuscitation (DNR) PRE-ARREST INTERVENTIONS DESIRED  Consults: None  Family Communication: No family in room  Severity of Illness: The appropriate patient status for this patient is OBSERVATION. Observation status is judged to be reasonable and necessary in order to provide the required intensity of service to ensure the patient's safety. The patient's presenting symptoms, physical exam findings, and initial radiographic and laboratory data in the context of their medical condition is felt to place them at decreased risk for further clinical deterioration. Furthermore, it is anticipated that the patient will be medically  stable for discharge from the hospital within 2 midnights of admission.   Author: Joda Braatz M., DO 02/09/2023 5:04 AM  For on call review [email or web address].

## 2023-02-09 NOTE — Progress Notes (Addendum)
 This patient called out to the nurses station at 19:45 requesting to speak to charge nurse; and that they needed to be in the room within 5 minutes. This charge RN went into the patient's room and he began yelling, stating that he's been requesting to speak to MD since 1600. This patient stated that the MD has until 20:45 or he will leave the hospital. Pt refuses to inform nursing staff on what he wants to speak to the MD's about. This clinical research associate sent a secure chat to MD Franky and informed him that pt requested to see the MD on call. MD Franky let me know that he would come see this patient when he is available to do so. This clinical research associate relayed that message to patient. This patient's care nurse  went back in to check on patient and he began yelling at her, requesting for the charge nurse again. This charge RN returned back to the patient's room and he began yelling once again, asking why the MD is refusing to see him. This RN reiterated what the MD said and let him know that the MD was not refusing to see him but he would have to be patient and wait. This RN asked the patient if he wanted to stay and wait to see the MD and he agreed to stay at wait. Care ongoing.   22:30- Patient called out to the nursing station requesting to speak to charge nurse. This writer went into the patient's room and he requested his 2 PIV's be taken out. Patient voiced concerns about not knowing the plan of care. This clinical research associate explained the current interventions in place and the rationales. After removing PIV's this patient stated that we needed to get him a taxi ride home. This clinical research associate explained that taxi rides are normally set up during the day, and patient stated that since he doesn't have a ride home he's going to stay until the morning. MD updated.

## 2023-02-09 NOTE — Progress Notes (Signed)
 Patient seen and examined.  Still in the emergency room.  Multiple questions answered.  Had episode of hypoglycemia with blood sugar 39 after receiving insulin  drip at [facility].  On my exam, he was pleasant interactive and decides to be treated well, he wants to follow any instructions that we can help him with to control his blood sugars.  Denies any nausea vomiting.  Tolerating soft diet.  Blood sugars are 90 on repeat exam.  Continues on IV fluids.  DKA, resolved.  AKI and dehydration. Continue IV fluids.  Keep on low-dose insulin .  Once renal functions improved, will decide discharge regimen.  High risk of hypoglycemia.  He will probably do well on simplified regimen of low-dose insulin  and he is agreeable.  Will monitor insulin  demand next 24 hours and likely discharge him on insulin .  He will need some medication assistance.   Same-day admit.  No charge visit.

## 2023-02-09 NOTE — Inpatient Diabetes Management (Signed)
 Inpatient Diabetes Program Recommendations  AACE/ADA: New Consensus Statement on Inpatient Glycemic Control (2015)  Target Ranges:  Prepandial:   less than 140 mg/dL      Peak postprandial:   less than 180 mg/dL (1-2 hours)      Critically ill patients:  140 - 180 mg/dL   Lab Results  Component Value Date   GLUCAP 90 02/09/2023   HGBA1C 7.9 (H) 02/08/2023    Review of Glycemic Control  Latest Reference Range & Units 02/09/23 03:16 02/09/23 04:16 02/09/23 04:45 02/09/23 06:25 02/09/23 08:06 02/09/23 12:04  Glucose-Capillary 70 - 99 mg/dL 885 (H) 39 (LL) 855 (H) 188 (H) 242 (H) 90   Diabetes history: DM Outpatient Diabetes medications:  Glucotrol  5 mg bid Metformin  500 mg bid Current orders for Inpatient glycemic control:  Semglee  5 units daily Novolog  3 units tid with meals  Novolog  0-9 units tid with meals   Inpatient Diabetes Program Recommendations:    Agree with current orders.  Will patient need insulin  at discharge?  Thanks,  Randall Bullocks, RN, BC-ADM Inpatient Diabetes Coordinator Pager [phone]  (8a-5p)

## 2023-02-10 ENCOUNTER — Other Ambulatory Visit (HOSPITAL_COMMUNITY): Payer: Self-pay

## 2023-02-10 DIAGNOSIS — E1110 Type 2 diabetes mellitus with ketoacidosis without coma: Secondary | ICD-10-CM | POA: Diagnosis not present

## 2023-02-10 DIAGNOSIS — N179 Acute kidney failure, unspecified: Secondary | ICD-10-CM | POA: Diagnosis not present

## 2023-02-10 LAB — GLUCOSE, CAPILLARY: Glucose-Capillary: 303 mg/dL — ABNORMAL HIGH (ref 70–99)

## 2023-02-10 MED ORDER — INSULIN ASPART PROT & ASPART (70-30 MIX) 100 UNIT/ML PEN
5.0000 [IU] | PEN_INJECTOR | Freq: Two times a day (BID) | SUBCUTANEOUS | 0 refills | Status: DC
  Filled 2023-02-10: qty 3, 28d supply, fill #0

## 2023-02-10 MED ORDER — INSULIN PEN NEEDLE 32G X 4 MM MISC
1.0000 | Freq: Three times a day (TID) | 0 refills | Status: DC
  Filled 2023-02-10: qty 100, 33d supply, fill #0

## 2023-02-10 NOTE — Plan of Care (Signed)

## 2023-02-10 NOTE — TOC Progression Note (Signed)
 Transition of Care [facility]) - Progression Note    Patient Details  Name: James Bradford MRN: [id number] Date of Birth: 03-08-1965  Transition of [facility] CM/SW Contact  Rosaline JONELLE Joe, RN Phone Number: 02/10/2023, 9:28 AM  Clinical Narrative:    Cm spoke with Rocky, RN in Charge and she requests taxi voucher to return home.  Taxi voucher provided through [facility] department.        Expected Discharge Plan and Services         Expected Discharge Date: 02/10/23                                     Social Determinants of Health (SDOH) Interventions SDOH Screenings   Food Insecurity: No Food Insecurity (02/09/2023)  Housing: High Risk (02/09/2023)  Transportation Needs: No Transportation Needs (02/09/2023)  Utilities: Not At Risk (02/09/2023)  Financial Resource Strain: Not on File (05/23/2021)   Received from [place], [place]  Physical Activity: Not on File (05/23/2021)   Received from [place], [place]  Social Connections: Not on File (10/18/2022)   Received from [employer]  Stress: Not on File (05/23/2021)   Received from [place], [place]  Tobacco Use: Low Risk  (02/09/2023)    Readmission Risk Interventions    03/20/2022   11:05 AM 03/19/2022    9:53 AM 12/13/2021    1:26 PM  Readmission Risk Prevention Plan  Transportation Screening Complete Complete Complete  PCP or Specialist Appt within 3-5 Days Complete Complete Complete  HRI or Home Care Consult Complete Complete Complete  Social Work Consult for Recovery Care Planning/Counseling Complete Complete Complete  Palliative Care Screening Not Applicable Not Applicable Not Applicable  Medication Review Oceanographer) Complete Complete Complete

## 2023-02-10 NOTE — Plan of Care (Signed)

## 2023-02-10 NOTE — Progress Notes (Signed)
 AT around 1930, I entered the patient room to introduce myself, I then asked the pt if he can tell me his name and DOB, pt stated his name and DOB but was not in a good mood, I asked pt if everything was ok to which he became very irate and loud, start yelling. Pt stated he had requesting to see/speak with the doctor since 1600 and no one have came to see him, I asked pt was there something in particular he need to asked the doctor, to which the patient turn his back and put the sheet over his head. I then when to speak with the charge nurse Mia, RN and she secure chat Dr Franky and inform him about the pt request/concern. . Dr Franky called to [facility] to get some information on pt, to which he was told pt have been waiting to talk with a doctor but no one never came to see him. Dr Franky advised me that there was a lot of calls he need to answer and he would not be able to come at pt bedside for at least a couple hours. Dr Franky then spoke with the charge nurse. AMA form was given to me from Mia, RN, after reading pt notes stated he left AMA from [facility].  2000- I then again entered the pt room and let him know that Dr Franky will not be able to come at the moment, pt then yell out that the Doctors here is refusing to see him and he will be leaving, I advised pt that I will need to removed his IV, and I have an AMA form, to which the patient states if he leave he will not signing the form. Pt started yelling stating  you mean to tell me I have been in this hospital all day and the doctor is refusing to see me. I then went and get the charge nurse Mia, RN after the pt yelling and requesting to speak with her.

## 2023-02-10 NOTE — Discharge Summary (Signed)
 Physician Discharge Summary  James Bradford FMW:[id number] DOB: August 24, 1965 DOA: 02/08/2023  PCP: [facility]  Admit date: 02/08/2023 Discharge date: 02/10/2023  Admitted From: Home Disposition: Home  Recommendations for Outpatient Follow-up:  Follow up with PCP in 1-2 weeks Please obtain BMP/CBC in one week Will send referral to endocrine and gastroenterology for follow-up  Home Health: N/A Equipment/Devices: N/A   Discharge Condition: Stable CODE STATUS: DNR with full scope of treatment. Diet recommendation: Low-carb diet  Discharge summary: 58 year old with history of type 2 diabetes on different medications, CKD stage IIIa with baseline creatinine about 1.4, hypertension, hyperlipidemia, marijuana use and cyclical vomiting syndrome possible diabetic gastroparesis came to the emergency room with nausea vomiting and he was found with DKA and AKI.  He was treated with insulin  infusion and transition to subcu insulin , he left AMA from [facility] emergency room and came to [facility] emergency room.  Here he was found dehydrated with AKI, anion gap was closed.  Patient was treated with IV fluids and subcu insulin  with good clinical recovery.  His symptoms are relieved now.  DKA, resolved.  AKI and dehydration. Clinically improving today.  Patient has used about 15 units of insulin  over the last 24 hours.  He has intolerance to multiple medications that was tried by primary care physician.  His hemoglobin A1c is only 7.9, however he does get frequent exacerbations.  Patient decided to give a trial of insulin .  He tells us  that he has intolerance to Jardiance, metformin .  He also gets occasionally hypoglycemic.  There is also some compliance issues.  Since he has tried all oral medications and unable to tolerate most of them, we decided to give a trial of insulin . Discharged home on insulin  70/30, 5 units twice daily along with metformin  if tolerated.  Close monitoring of  blood sugars at home.  Will send referral to endocrinology.  Discussed about importance of consistency and eating, diabetic diet.  Patient understands. Resume home medications for hypertension.   Discharge Diagnoses:  Principal Problem:   AKI (acute kidney injury) (HCC) Active Problems:   Acute kidney injury superimposed on chronic kidney disease stage IIIa (HCC)   Diabetic ketoacidosis without coma associated with type 2 diabetes mellitus [facility])    Discharge Instructions  Discharge Instructions     Ambulatory referral to Endocrinology   Complete by: As directed    Ambulatory referral to Gastroenterology   Complete by: As directed    What is the reason for referral?: Colonoscopy   Diet - low sodium heart healthy   Complete by: As directed    Diet Carb Modified   Complete by: As directed    Increase activity slowly   Complete by: As directed       Allergies as of 02/10/2023       Reactions   Ibuprofen Nausea And Vomiting, Other (See Comments)   Stomach pain and Abdominal pain, too   Pork-derived Products Other (See Comments)   Religious reason   Whole Blood Other (See Comments)   Religious reason   Asa [aspirin] Other (See Comments)   Stomach pain        Medication List     STOP taking these medications    glipiZIDE  5 MG tablet Commonly known as: GLUCOTROL        TAKE these medications    insulin  aspart protamine - aspart (70-30) 100 UNIT/ML FlexPen Commonly known as: NOVOLOG  70/30 MIX Inject 5 Units into the skin 2 (two) times daily  with a meal.   KRILL OIL PO Take 1 capsule by mouth daily.   lisinopril  10 MG tablet Commonly known as: ZESTRIL  Take 10 mg by mouth daily.   MENS 50+ MULTIVITAMIN PO Take 1 tablet by mouth daily.   metFORMIN  500 MG tablet Commonly known as: GLUCOPHAGE  Take 500 mg by mouth 2 (two) times daily.   ondansetron  4 MG disintegrating tablet Commonly known as: ZOFRAN -ODT 4mg  ODT q4 hours prn nausea/vomit What changed:   how much to take how to take this when to take this reasons to take this additional instructions   pantoprazole  40 MG tablet Commonly known as: PROTONIX  Take 1 tablet (40 mg total) by mouth 2 (two) times daily before a meal.   Pen Needles 31G X 5 MM Misc 1 each by Does not apply route 3 (three) times daily. May dispense any manufacturer covered by patient's insurance.   pravastatin  10 MG tablet Commonly known as: PRAVACHOL  Take 1 tablet (10 mg total) by mouth daily. What changed: when to take this   sildenafil 100 MG tablet Commonly known as: VIAGRA Take 100 mg by mouth daily as needed (for E.D.).        Allergies  Allergen Reactions   Ibuprofen Nausea And Vomiting and Other (See Comments)    Stomach pain and Abdominal pain, too   Pork-Derived Products Other (See Comments)    Religious reason   Whole Blood Other (See Comments)    Religious reason   Asa [Aspirin] Other (See Comments)    Stomach pain    Consultations: None   Procedures/Studies: CT ABDOMEN PELVIS W CONTRAST Result Date: 01/12/2023 CLINICAL DATA:  58 year old male with history of left lower quadrant abdominal pain. EXAM: CT ABDOMEN AND PELVIS WITH CONTRAST TECHNIQUE: Multidetector CT imaging of the abdomen and pelvis was performed using the standard protocol following bolus administration of intravenous contrast. RADIATION DOSE REDUCTION: This exam was performed according to the departmental dose-optimization program which includes automated exposure control, adjustment of the mA and/or kV according to patient size and/or use of iterative reconstruction technique. CONTRAST:  OMNIPAQUE  IOHEXOL  300 MG/ML  SOLN COMPARISON:  CT of the abdomen and pelvis 11/23/2022. FINDINGS: Lower chest: Unremarkable. Hepatobiliary: No suspicious cystic or solid hepatic lesions. No intra or extrahepatic biliary ductal dilatation. Gallbladder is unremarkable in appearance. Pancreas: No pancreatic mass. No pancreatic ductal  dilatation. No pancreatic or peripancreatic fluid collections or inflammatory changes. Spleen: Unremarkable. Adrenals/Urinary Tract: Multiple low-attenuation lesions scattered throughout the kidneys bilaterally, largest of which are compatible with simple cysts, measuring up to 2.6 cm in diameter in the posterior aspect of the interpolar region of the right kidney. The smaller lesions are too small to definitively characterize, but are also favored to represent tiny cysts (no imaging follow-up for these renal lesions is recommended at this time). No aggressive appearing renal lesions are noted. No hydroureteronephrosis. Urinary bladder is unremarkable in appearance. Bilateral adrenal glands are normal in appearance. Stomach/Bowel: The appearance of the stomach is normal. No pathologic dilatation of small bowel or colon. Normal appendix. Vascular/Lymphatic: Atherosclerosis in the pelvic vasculature. No lymphadenopathy noted in the abdomen or pelvis. Reproductive: Prostate gland and seminal vesicles are unremarkable in appearance. Other: No significant volume of ascites.  No pneumoperitoneum. Musculoskeletal: There are no aggressive appearing lytic or blastic lesions noted in the visualized portions of the skeleton. IMPRESSION: 1. No acute findings are noted in the abdomen or pelvis to account for the patient's symptoms. 2. Atherosclerosis. 3. Additional incidental findings, as above.  Electronically Signed   By: Toribio Aye M.D.   On: 01/12/2023 07:14   (Echo, Carotid, EGD, Colonoscopy, ERCP)    Subjective: Patient seen in the morning rounds.  Detailed discussion about the management of diabetes, diet and lifestyle modifications.  Patient is eager to be discharged.  He requested referral to GI, endocrine as well as new primary care.   Discharge Exam: Vitals:   02/10/23 0438 02/10/23 0758  BP: 116/75 (!) 140/90  Pulse: 87 100  Resp: 18 17  Temp: 98.5 F (36.9 C) 99.3 F (37.4 C)  SpO2: 100% 100%    Vitals:   02/09/23 1510 02/09/23 2232 02/10/23 0438 02/10/23 0758  BP:  136/81 116/75 (!) 140/90  Pulse:  66 87 100  Resp:  18 18 17   Temp: 98.9 F (37.2 C) 98.2 F (36.8 C) 98.5 F (36.9 C) 99.3 F (37.4 C)  TempSrc: Oral Oral Oral Oral  SpO2:  100% 100% 100%    General: Pt is alert, awake, not in acute distress Cardiovascular: RRR, S1/S2 +, no rubs, no gallops Respiratory: CTA bilaterally, no wheezing, no rhonchi Abdominal: Soft, NT, ND, bowel sounds + Extremities: no edema, no cyanosis    The results of significant diagnostics from this hospitalization (including imaging, microbiology, ancillary and laboratory) are listed below for reference.     Microbiology: Recent Results (from the past 240 hours)  Resp panel by RT-PCR (RSV, Flu A&B, Covid) Anterior Nasal Swab     Status: None   Collection Time: 02/09/23  5:36 AM   Specimen: Anterior Nasal Swab  Result Value Ref Range Status   SARS Coronavirus 2 by RT PCR NEGATIVE NEGATIVE Final   Influenza A by PCR NEGATIVE NEGATIVE Final   Influenza B by PCR NEGATIVE NEGATIVE Final    Comment: (NOTE) The Xpert Xpress SARS-CoV-2/FLU/RSV plus assay is intended as an aid in the diagnosis of influenza from Nasopharyngeal swab specimens and should not be used as a sole basis for treatment. Nasal washings and aspirates are unacceptable for Xpert Xpress SARS-CoV-2/FLU/RSV testing.  Fact Sheet for Patients: bloggercourse.com  Fact Sheet for Healthcare Providers: seriousbroker.it  This test is not yet approved or cleared by the [place]  FDA and has been authorized for detection and/or diagnosis of SARS-CoV-2 by FDA under an Emergency Use Authorization (EUA). This EUA will remain in effect (meaning this test can be used) for the duration of the COVID-19 declaration under Section 564(b)(1) of the Act, 21 U.S.C. section 360bbb-3(b)(1), unless the authorization is terminated  or revoked.     Resp Syncytial Virus by PCR NEGATIVE NEGATIVE Final    Comment: (NOTE) Fact Sheet for Patients: bloggercourse.com  Fact Sheet for Healthcare Providers: seriousbroker.it  This test is not yet approved or cleared by the [place]  FDA and has been authorized for detection and/or diagnosis of SARS-CoV-2 by FDA under an Emergency Use Authorization (EUA). This EUA will remain in effect (meaning this test can be used) for the duration of the COVID-19 declaration under Section 564(b)(1) of the Act, 21 U.S.C. section 360bbb-3(b)(1), unless the authorization is terminated or revoked.  Performed at [facility], [address], [place], [place] [address]      Labs: BNP (last 3 results) No results for input(s): BNP in the last 8760 hours. Basic Metabolic Panel: Recent Labs  Lab 02/08/23 0633 02/08/23 1143 02/08/23 1801 02/09/23 0000 02/09/23 0006 02/09/23 0536  NA 135 136 136 132* 133* 137  K 4.4 4.3 4.0 4.6 4.6 4.0  CL 105 103 103 101  --  107  CO2 12* 20* 18* 21*  --  21*  GLUCOSE 209* 90 160* 349*  --  240*  BUN 46* 41* 40* 36*  --  25*  CREATININE 2.42* 2.16* 2.24* 1.94*  --  1.44*  CALCIUM 9.4 9.3 9.7 9.4  --  8.0*   Liver Function Tests: Recent Labs  Lab 02/08/23 0115 02/09/23 0000  AST 24 22  ALT 19 18  ALKPHOS 57 56  BILITOT 1.1 0.9  PROT 8.9* 7.3  ALBUMIN 5.1* 4.1   Recent Labs  Lab 02/08/23 0115  LIPASE 54*   No results for input(s): AMMONIA in the last 168 hours. CBC: Recent Labs  Lab 02/08/23 0115 02/09/23 0000 02/09/23 0006  WBC 6.2 8.7  --   NEUTROABS 4.8 6.5  --   HGB 12.2* 11.9* 13.3  HCT 36.4* 36.3* 39.0  MCV 97.6 97.1  --   PLT 251 252  --    Cardiac Enzymes: No results for input(s): CKTOTAL, CKMB, CKMBINDEX, TROPONINI in the last 168 hours. BNP: Invalid input(s): POCBNP CBG: Recent Labs  Lab 02/09/23 0806 02/09/23 1204 02/09/23 1628  02/09/23 2230 02/10/23 0756  GLUCAP 242* 90 179* 183* 303*   D-Dimer No results for input(s): DDIMER in the last 72 hours. Hgb A1c Recent Labs    02/08/23 0633  HGBA1C 7.9*   Lipid Profile No results for input(s): CHOL, HDL, LDLCALC, TRIG, CHOLHDL, LDLDIRECT in the last 72 hours. Thyroid function studies No results for input(s): TSH, T4TOTAL, T3FREE, THYROIDAB in the last 72 hours.  Invalid input(s): FREET3 Anemia work up No results for input(s): VITAMINB12, FOLATE, FERRITIN, TIBC, IRON, RETICCTPCT in the last 72 hours. Urinalysis    Component Value Date/Time   COLORURINE YELLOW 02/08/2023 0325   APPEARANCEUR HAZY (A) 02/08/2023 0325   LABSPEC 1.012 02/08/2023 0325   PHURINE 5.0 02/08/2023 0325   GLUCOSEU >=500 (A) 02/08/2023 0325   HGBUR SMALL (A) 02/08/2023 0325   HGBUR negative 02/26/2009 1016   BILIRUBINUR NEGATIVE 02/08/2023 0325   KETONESUR 20 (A) 02/08/2023 0325   PROTEINUR 100 (A) 02/08/2023 0325   UROBILINOGEN 0.2 05/13/2020 1050   NITRITE NEGATIVE 02/08/2023 0325   LEUKOCYTESUR NEGATIVE 02/08/2023 0325   Sepsis Labs Recent Labs  Lab 02/08/23 0115 02/09/23 0000  WBC 6.2 8.7   Microbiology Recent Results (from the past 240 hours)  Resp panel by RT-PCR (RSV, Flu A&B, Covid) Anterior Nasal Swab     Status: None   Collection Time: 02/09/23  5:36 AM   Specimen: Anterior Nasal Swab  Result Value Ref Range Status   SARS Coronavirus 2 by RT PCR NEGATIVE NEGATIVE Final   Influenza A by PCR NEGATIVE NEGATIVE Final   Influenza B by PCR NEGATIVE NEGATIVE Final    Comment: (NOTE) The Xpert Xpress SARS-CoV-2/FLU/RSV plus assay is intended as an aid in the diagnosis of influenza from Nasopharyngeal swab specimens and should not be used as a sole basis for treatment. Nasal washings and aspirates are unacceptable for Xpert Xpress SARS-CoV-2/FLU/RSV testing.  Fact Sheet for  Patients: bloggercourse.com  Fact Sheet for Healthcare Providers: seriousbroker.it  This test is not yet approved or cleared by the [place]  FDA and has been authorized for detection and/or diagnosis of SARS-CoV-2 by FDA under an Emergency Use Authorization (EUA). This EUA will remain in effect (meaning this test can be used) for the duration of the COVID-19 declaration under Section 564(b)(1) of the Act, 21 U.S.C. section 360bbb-3(b)(1), unless  the authorization is terminated or revoked.     Resp Syncytial Virus by PCR NEGATIVE NEGATIVE Final    Comment: (NOTE) Fact Sheet for Patients: bloggercourse.com  Fact Sheet for Healthcare Providers: seriousbroker.it  This test is not yet approved or cleared by the [place]  FDA and has been authorized for detection and/or diagnosis of SARS-CoV-2 by FDA under an Emergency Use Authorization (EUA). This EUA will remain in effect (meaning this test can be used) for the duration of the COVID-19 declaration under Section 564(b)(1) of the Act, 21 U.S.C. section 360bbb-3(b)(1), unless the authorization is terminated or revoked.  Performed at [facility], [address] [place], [place] [address]      Time coordinating discharge: 35 minutes  SIGNED:   Renato Applebaum, MD  [facility] 02/10/2023, 8:27 AM

## 2023-02-11 ENCOUNTER — Encounter: Payer: Self-pay | Admitting: Gastroenterology

## 2023-02-11 ENCOUNTER — Other Ambulatory Visit (HOSPITAL_COMMUNITY): Payer: Self-pay

## 2023-02-12 ENCOUNTER — Other Ambulatory Visit (HOSPITAL_COMMUNITY): Payer: Self-pay

## 2023-02-13 ENCOUNTER — Other Ambulatory Visit (HOSPITAL_COMMUNITY): Payer: Self-pay

## 2023-02-14 ENCOUNTER — Other Ambulatory Visit (HOSPITAL_COMMUNITY): Payer: Self-pay

## 2023-02-23 ENCOUNTER — Encounter: Payer: Self-pay | Admitting: Gastroenterology

## 2023-02-24 ENCOUNTER — Ambulatory Visit: Payer: BLUE CROSS/BLUE SHIELD | Admitting: Gastroenterology

## 2023-03-04 ENCOUNTER — Encounter (HOSPITAL_COMMUNITY): Payer: Self-pay

## 2023-03-04 ENCOUNTER — Emergency Department (HOSPITAL_COMMUNITY)
Admission: EM | Admit: 2023-03-04 | Discharge: 2023-03-04 | Discharge status: Against Medical Advice | Payer: BLUE CROSS/BLUE SHIELD | Attending: Emergency Medicine | Admitting: Emergency Medicine

## 2023-03-04 ENCOUNTER — Other Ambulatory Visit: Payer: Self-pay | Admitting: Internal Medicine

## 2023-03-04 ENCOUNTER — Encounter (HOSPITAL_BASED_OUTPATIENT_CLINIC_OR_DEPARTMENT_OTHER): Payer: Self-pay | Admitting: Emergency Medicine

## 2023-03-04 ENCOUNTER — Other Ambulatory Visit: Payer: Self-pay

## 2023-03-04 ENCOUNTER — Emergency Department (HOSPITAL_BASED_OUTPATIENT_CLINIC_OR_DEPARTMENT_OTHER)
Admission: EM | Admit: 2023-03-04 | Discharge: 2023-03-04 | Discharge status: Against Medical Advice | Payer: BLUE CROSS/BLUE SHIELD | Attending: Emergency Medicine | Admitting: Emergency Medicine

## 2023-03-04 DIAGNOSIS — R109 Unspecified abdominal pain: Secondary | ICD-10-CM | POA: Insufficient documentation

## 2023-03-04 DIAGNOSIS — Z794 Long term (current) use of insulin: Secondary | ICD-10-CM | POA: Diagnosis not present

## 2023-03-04 DIAGNOSIS — Z5321 Procedure and treatment not carried out due to patient leaving prior to being seen by health care provider: Secondary | ICD-10-CM | POA: Diagnosis not present

## 2023-03-04 DIAGNOSIS — R739 Hyperglycemia, unspecified: Secondary | ICD-10-CM | POA: Insufficient documentation

## 2023-03-04 DIAGNOSIS — R112 Nausea with vomiting, unspecified: Secondary | ICD-10-CM | POA: Insufficient documentation

## 2023-03-04 DIAGNOSIS — E119 Type 2 diabetes mellitus without complications: Secondary | ICD-10-CM | POA: Insufficient documentation

## 2023-03-04 LAB — URINALYSIS, ROUTINE W REFLEX MICROSCOPIC
Bilirubin Urine: NEGATIVE
Glucose, UA: >=500 mg/dL — AB
Hgb urine dipstick: NEGATIVE
Ketones, ur: NEGATIVE mg/dL
Leukocytes,Ua: NEGATIVE
Nitrite: NEGATIVE
Protein, ur: NEGATIVE mg/dL
Specific Gravity, Urine: 1.013 (ref 1.005–1.030)
pH: 5 (ref 5.0–8.0)

## 2023-03-04 LAB — CBC
HCT: 34.4 % — ABNORMAL LOW (ref 39.0–52.0)
Hemoglobin: 11.8 g/dL — ABNORMAL LOW (ref 13.0–17.0)
MCH: 32.4 pg (ref 26.0–34.0)
MCHC: 34.3 g/dL (ref 30.0–36.0)
MCV: 94.5 fL (ref 80.0–100.0)
Platelets: 340 10*3/uL (ref 150–400)
RBC: 3.64 MIL/uL — ABNORMAL LOW (ref 4.22–5.81)
RDW: 12 % (ref 11.5–15.5)
WBC: 4.7 10*3/uL (ref 4.0–10.5)
nRBC: 0 % (ref 0.0–0.2)

## 2023-03-04 LAB — ETHANOL: Alcohol, Ethyl (B): <10 mg/dL (ref <10)

## 2023-03-04 LAB — COMPREHENSIVE METABOLIC PANEL
ALT: 21 U/L (ref 0–44)
AST: 24 U/L (ref 15–41)
Albumin: 4.1 g/dL (ref 3.5–5.0)
Alkaline Phosphatase: 78 U/L (ref 38–126)
Anion gap: 8 (ref 5–15)
BUN: 47 mg/dL — ABNORMAL HIGH (ref 6–20)
CO2: 18 mmol/L — ABNORMAL LOW (ref 22–32)
Calcium: 9.2 mg/dL (ref 8.9–10.3)
Chloride: 108 mmol/L (ref 98–111)
Creatinine, Ser: 2.02 mg/dL — ABNORMAL HIGH (ref 0.61–1.24)
GFR, Estimated: 38 mL/min — ABNORMAL LOW (ref >60)
Glucose, Bld: 279 mg/dL — ABNORMAL HIGH (ref 70–99)
Potassium: 5.3 mmol/L — ABNORMAL HIGH (ref 3.5–5.1)
Sodium: 134 mmol/L — ABNORMAL LOW (ref 135–145)
Total Bilirubin: 0.3 mg/dL (ref 0.0–1.2)
Total Protein: 7 g/dL (ref 6.5–8.1)

## 2023-03-04 LAB — RAPID URINE DRUG SCREEN, HOSP PERFORMED
Amphetamines: NOT DETECTED
Barbiturates: NOT DETECTED
Benzodiazepines: NOT DETECTED
Cocaine: NOT DETECTED
Opiates: NOT DETECTED
Tetrahydrocannabinol: POSITIVE — AB

## 2023-03-04 LAB — I-STAT VENOUS BLOOD GAS, ED
Acid-base deficit: 6 mmol/L — ABNORMAL HIGH (ref 0.0–2.0)
Bicarbonate: 19 mmol/L — ABNORMAL LOW (ref 20.0–28.0)
Calcium, Ion: 1.27 mmol/L (ref 1.15–1.40)
HCT: 36 % — ABNORMAL LOW (ref 39.0–52.0)
Hemoglobin: 12.2 g/dL — ABNORMAL LOW (ref 13.0–17.0)
O2 Saturation: 87 %
Potassium: 5.3 mmol/L — ABNORMAL HIGH (ref 3.5–5.1)
Sodium: 137 mmol/L (ref 135–145)
TCO2: 20 mmol/L — ABNORMAL LOW (ref 22–32)
pCO2, Ven: 36 mm[Hg] — ABNORMAL LOW (ref 44–60)
pH, Ven: 7.331 (ref 7.25–7.43)
pO2, Ven: 57 mm[Hg] — ABNORMAL HIGH (ref 32–45)

## 2023-03-04 LAB — LIPASE, BLOOD: Lipase: 68 U/L — ABNORMAL HIGH (ref 11–51)

## 2023-03-04 LAB — CBG MONITORING, ED
Glucose-Capillary: 249 mg/dL — ABNORMAL HIGH (ref 70–99)
Glucose-Capillary: 117 mg/dL — ABNORMAL HIGH (ref 70–99)

## 2023-03-04 MED ORDER — INSULIN PEN NEEDLE 32G X 4 MM MISC: 1.0000 | Freq: Three times a day (TID) | 0 refills | Status: AC

## 2023-03-04 MED ORDER — INSULIN ASPART PROT & ASPART (70-30 MIX) 100 UNIT/ML PEN: 5.0000 [IU] | PEN_INJECTOR | Freq: Two times a day (BID) | SUBCUTANEOUS | 0 refills | Status: AC

## 2023-03-04 NOTE — ED Notes (Signed)
Called patient several times patient didn't answer

## 2023-03-04 NOTE — ED Notes (Signed)
Pt bib [facility] with c/o ABD pain, hyperglycemia. CBG 270 Bp 132/60 HR 110 RR 20 99 O2 Left AMA from  [facility]

## 2023-03-04 NOTE — ED Notes (Signed)
Pt called to be roomed x3 with no answer.

## 2023-03-04 NOTE — ED Provider Triage Note (Signed)
Emergency Medicine Provider Triage Evaluation Note  James Bradford , a 58 y.o. male  was evaluated in triage.  Pt complains of abdominal pain.  States feels like prior episodes of DKA.  Reports some nausea/vomiting.  Also requested UDS due to upcoming court date. Glucose 250's w/EMS.  Hx DM2.  Review of Systems  Positive: Abdominal pain, nausea Negative: fever  Physical Exam  BP 137/85 (BP Location: Right Arm)   Pulse 94   Temp 98.2 F (36.8 C) (Oral)   Resp 16   Ht 5\' 7"  (1.702 m)   Wt 79.3 kg   SpO2 97%   BMI 27.38 kg/m  Gen:   Awake, no distress   Resp:  Normal effort  MSK:   Moves extremities without difficulty  Other:    Medical Decision Making  Medically screening exam initiated at 5:08 AM.  Appropriate orders placed.  James Bradford was informed that the remainder of the evaluation will be completed by another provider, this initial triage assessment does not replace that evaluation, and the importance of remaining in the ED until their evaluation is complete.  Abdominal pain, nausea/vomiting.  Concerned for recurrent DKA.  Labs sent including VBG.   James Hatchet, PA-C 03/04/23 [phone]

## 2023-03-04 NOTE — ED Notes (Signed)
Pt to sort desk numerous times requesting to speak to charge nurse Lowella Bandy K. Made aware.

## 2023-03-04 NOTE — ED Triage Notes (Signed)
Pt arrived from home via [facility] c/o abd hurts from injecting insulin

## 2023-03-04 NOTE — ED Notes (Addendum)
Labs drawn at [facility] Earlier today. Needs insulin refill.

## 2023-03-05 ENCOUNTER — Other Ambulatory Visit (HOSPITAL_COMMUNITY): Payer: Self-pay

## 2023-03-23 NOTE — Progress Notes (Unsigned)
 Chief Complaint:GERD, discuss colon Primary GI Doctor:unassigned  HPI:  Patient is a  58  year old male patient with past medical history of DM type 2, hypertension, stage 3 CKD*****who was referred to me by [facility] on **** for a complaint of *** .      Interval History  Patient admits/denies GERD Patient taking Pantoprazole 40 mg po daily Patient admits/denies dysphagia Patient admits/denies nausea, vomiting, or weight loss  Patient admits/denies altered bowel habits Patient admits/denies abdominal pain Patient admits/denies rectal bleeding   Denies/Admits alcohol Denies/Admits smoking Denies/Admits NSAID use. Denies/Admits they are on blood thinners.  Patients last colonoscopy*** Patients last EGD  Patient's family history includes  Wt Readings from Last 3 Encounters:  03/04/23 174 lb 13.2 oz (79.3 kg)  02/08/23 175 lb (79.4 kg)  01/15/23 173 lb 15.1 oz (78.9 kg)      Past Medical History:  Diagnosis Date   AKI (acute kidney injury) (HCC) 03/19/2022   Aortic atherosclerosis (HCC) 08/24/2020   Hyperlipidemia    Hypertension    Marijuana abuse 09/28/2020   Sinus bradycardia    Stage 3 chronic kidney disease (HCC)    Type 2 diabetes mellitus (HCC)    Unspecified glaucoma 02/18/2008   Qualifier: Diagnosis of  By: Daphine Deutscher FNP, Zena Amos      Past Surgical History:  Procedure Laterality Date   HAND SURGERY     LEG SURGERY      Current Outpatient Medications  Medication Sig Dispense Refill   insulin aspart protamine - aspart (NOVOLOG 70/30 MIX) (70-30) 100 UNIT/ML FlexPen Inject 5 Units into the skin 2 (two) times daily with a meal. 15 mL 0   Insulin Pen Needle 32G X 4 MM MISC Use as directed 3 (three) times daily. 100 each 0   KRILL OIL PO Take 1 capsule by mouth daily.     lisinopril (ZESTRIL) 10 MG tablet Take 10 mg by mouth daily.     metFORMIN (GLUCOPHAGE) 500 MG tablet Take 500 mg by mouth 2 (two) times daily.     Multiple  Vitamins-Minerals (MENS 50+ MULTIVITAMIN PO) Take 1 tablet by mouth daily.     ondansetron (ZOFRAN-ODT) 4 MG disintegrating tablet 4mg  ODT q4 hours prn nausea/vomit (Patient taking differently: Take 4 mg by mouth 2 (two) times daily as needed for nausea or vomiting.) 10 tablet 0   pantoprazole (PROTONIX) 40 MG tablet Take 1 tablet (40 mg total) by mouth 2 (two) times daily before a meal. 60 tablet 1   pravastatin (PRAVACHOL) 10 MG tablet Take 1 tablet (10 mg total) by mouth daily. (Patient taking differently: Take 10 mg by mouth at bedtime.) 30 tablet 1   sildenafil (VIAGRA) 100 MG tablet Take 100 mg by mouth daily as needed (for E.D.).     No current facility-administered medications for this visit.    Allergies as of 03/25/2023 - Review Complete 03/04/2023  Allergen Reaction Noted   Ibuprofen Nausea And Vomiting and Other (See Comments) 09/05/2014   Pork-derived products Other (See Comments) 06/17/2022   Whole blood Other (See Comments) 11/27/2022   Asa [aspirin] Other (See Comments) 05/19/2013    Family History  Problem Relation Age of Onset   Hypertension Mother    Pancreatic cancer Father     Review of Systems:    Constitutional: No weight loss, fever, chills, weakness or fatigue HEENT: Eyes: No change in vision               Ears, Nose,  Throat:  No change in hearing or congestion Skin: No rash or itching Cardiovascular: No chest pain, chest pressure or palpitations   Respiratory: No SOB or cough Gastrointestinal: See HPI and otherwise negative Genitourinary: No dysuria or change in urinary frequency Neurological: No headache, dizziness or syncope Musculoskeletal: No new muscle or joint pain Hematologic: No bleeding or bruising Psychiatric: No history of depression or anxiety    Physical Exam:  Vital signs: There were no vitals taken for this visit.  Constitutional:   Pleasant Caucasian male*** appears to be in NAD, Well developed, Well nourished, alert and  cooperative Head:  Normocephalic and atraumatic. Eyes:   PEERL, EOMI. No icterus. Conjunctiva pink. Ears:  Normal auditory acuity. Neck:  Supple Throat: Oral cavity and pharynx without inflammation, swelling or lesion.  Respiratory: Respirations even and unlabored. Lungs clear to auscultation bilaterally.   No wheezes, crackles, or rhonchi.  Cardiovascular: Normal S1, S2. Regular rate and rhythm. No peripheral edema, cyanosis or pallor.  Gastrointestinal:  Soft, nondistended, nontender. No rebound or guarding. Normal bowel sounds. No appreciable masses or hepatomegaly. Rectal:  Not performed.  Anoscopy: Msk:  Symmetrical without gross deformities. Without edema, no deformity or joint abnormality.  Neurologic:  Alert and  oriented x4;  grossly normal neurologically.  Skin:   Dry and intact without significant lesions or rashes. Psychiatric: Oriented to person, place and time. Demonstrates good judgement and reason without abnormal affect or behaviors.  RELEVANT LABS AND IMAGING: CBC    Latest Ref Rng & Units 03/04/2023    5:18 AM 03/04/2023    5:11 AM 02/09/2023   12:06 AM  CBC  WBC 4.0 - 10.5 K/uL  4.7    Hemoglobin 13.0 - 17.0 g/dL 82.9  56.2  13.0   Hematocrit 39.0 - 52.0 % 36.0  34.4  39.0   Platelets 150 - 400 K/uL  340       CMP     Latest Ref Rng & Units 03/04/2023    5:18 AM 03/04/2023    5:11 AM 02/09/2023    5:36 AM  CMP  Glucose 70 - 99 mg/dL  865  784   BUN 6 - 20 mg/dL  47  25   Creatinine 6.96 - 1.24 mg/dL  2.95  2.84   Sodium 132 - 145 mmol/L 137  134  137   Potassium 3.5 - 5.1 mmol/L 5.3  5.3  4.0   Chloride 98 - 111 mmol/L  108  107   CO2 22 - 32 mmol/L  18  21   Calcium 8.9 - 10.3 mg/dL  9.2  8.0   Total Protein 6.5 - 8.1 g/dL  7.0    Total Bilirubin 0.0 - 1.2 mg/dL  0.3    Alkaline Phos 38 - 126 U/L  78    AST 15 - 41 U/L  24    ALT 0 - 44 U/L  21       Lab Results  Component Value Date   TSH 1.988 02/26/2009  01/12/2023 CT Abd/pelvis W  contrast IMPRESSION: 1. No acute findings are noted in the abdomen or pelvis to account for the patient's symptoms. 2. Atherosclerosis. 3. Additional incidental findings, as above. 11/23/22 CT Abd/pelvis W contrast [place] ABD limited RUQ   Assessment: 1. ***  Plan: 1. ***   Thank you for the courtesy of this consult. Please call me with any questions or concerns.   Shatisha Falter, FNP-C [facility] 03/23/2023, 4:49 PM  Cc: [facility]

## 2023-03-25 ENCOUNTER — Encounter: Payer: Self-pay | Admitting: Gastroenterology

## 2023-03-25 ENCOUNTER — Ambulatory Visit: Payer: BLUE CROSS/BLUE SHIELD | Admitting: Gastroenterology

## 2023-04-29 NOTE — Progress Notes (Unsigned)
 Chief Complaint:discuss colon , GERD Primary GI Doctor:***  HPI:  Patient is a  58  year old male patient with past medical history of hyperlipidemia, hypertension, stage III chronic kidney disease, type 2 diabetes, and marijuana abuse*****who was referred to me by [facility] on 02/10/23 for a complaint of GERD, discuss colon .    02/08/2023 patient seen at ED for hyperglycemia along with abdominal pain nausea and vomiting.Tachycardic and hypertensive on intake.CMP with hyperglycemia of 349, creatinine of 1.9 increased from patient's baseline of 1.3.Pseudohyponatremia in context of hyperglycemia. No acidosis, no anion gap but elevated beta hydroxybutyric acid to 3.3.   03/04/23 seen again in ED c/o abdominal pain with nausea and vomiting.  Interval History  Patient admits/denies GERD Patient taking pantoprazole 40mg  po daily*** Patient admits/denies dysphagia Patient using ondansetron prn Patient admits/denies nausea, vomiting, or weight loss  Patient admits/denies altered bowel habits Patient admits/denies abdominal pain Patient admits/denies rectal bleeding   Denies/Admits alcohol Denies/Admits smoking Denies/Admits NSAID use. Denies/Admits they are on blood thinners.  Patients last colonoscopy Patients last EGD  Patient's family history includes  Wt Readings from Last 3 Encounters:  03/04/23 174 lb 13.2 oz (79.3 kg)  02/08/23 175 lb (79.4 kg)  01/15/23 173 lb 15.1 oz (78.9 kg)      Past Medical History:  Diagnosis Date   AKI (acute kidney injury) (HCC) 03/19/2022   Aortic atherosclerosis (HCC) 08/24/2020   Hyperlipidemia    Hypertension    Marijuana abuse 09/28/2020   Sinus bradycardia    Stage 3 chronic kidney disease (HCC)    Type 2 diabetes mellitus (HCC)    Unspecified glaucoma 02/18/2008   Qualifier: Diagnosis of  By: Daphine Deutscher FNP, Zena Amos      Past Surgical History:  Procedure Laterality Date   HAND SURGERY     LEG SURGERY      Current  Outpatient Medications  Medication Sig Dispense Refill   insulin aspart protamine - aspart (NOVOLOG 70/30 MIX) (70-30) 100 UNIT/ML FlexPen Inject 5 Units into the skin 2 (two) times daily with a meal. 15 mL 0   Insulin Pen Needle 32G X 4 MM MISC Use as directed 3 (three) times daily. 100 each 0   KRILL OIL PO Take 1 capsule by mouth daily.     lisinopril (ZESTRIL) 10 MG tablet Take 10 mg by mouth daily.     metFORMIN (GLUCOPHAGE) 500 MG tablet Take 500 mg by mouth 2 (two) times daily.     Multiple Vitamins-Minerals (MENS 50+ MULTIVITAMIN PO) Take 1 tablet by mouth daily.     ondansetron (ZOFRAN-ODT) 4 MG disintegrating tablet 4mg  ODT q4 hours prn nausea/vomit (Patient taking differently: Take 4 mg by mouth 2 (two) times daily as needed for nausea or vomiting.) 10 tablet 0   pantoprazole (PROTONIX) 40 MG tablet Take 1 tablet (40 mg total) by mouth 2 (two) times daily before a meal. 60 tablet 1   pravastatin (PRAVACHOL) 10 MG tablet Take 1 tablet (10 mg total) by mouth daily. (Patient taking differently: Take 10 mg by mouth at bedtime.) 30 tablet 1   sildenafil (VIAGRA) 100 MG tablet Take 100 mg by mouth daily as needed (for E.D.).     No current facility-administered medications for this visit.    Allergies as of 04/30/2023 - Review Complete 03/04/2023  Allergen Reaction Noted   Ibuprofen Nausea And Vomiting and Other (See Comments) 09/05/2014   Pork-derived products Other (See Comments) 06/17/2022   Whole blood Other (See  Comments) 11/27/2022   Asa [aspirin] Other (See Comments) 05/19/2013    Family History  Problem Relation Age of Onset   Hypertension Mother    Pancreatic cancer Father     Review of Systems:    Constitutional: No weight loss, fever, chills, weakness or fatigue HEENT: Eyes: No change in vision               Ears, Nose, Throat:  No change in hearing or congestion Skin: No rash or itching Cardiovascular: No chest pain, chest pressure or palpitations   Respiratory:  No SOB or cough Gastrointestinal: See HPI and otherwise negative Genitourinary: No dysuria or change in urinary frequency Neurological: No headache, dizziness or syncope Musculoskeletal: No new muscle or joint pain Hematologic: No bleeding or bruising Psychiatric: No history of depression or anxiety    Physical Exam:  Vital signs: There were no vitals taken for this visit.  Constitutional:   Pleasant *** male appears to be in NAD, Well developed, Well nourished, alert and cooperative Head:  Normocephalic and atraumatic. Eyes:   PEERL, EOMI. No icterus. Conjunctiva pink. Ears:  Normal auditory acuity. Neck:  Supple Throat: Oral cavity and pharynx without inflammation, swelling or lesion.  Respiratory: Respirations even and unlabored. Lungs clear to auscultation bilaterally.   No wheezes, crackles, or rhonchi.  Cardiovascular: Normal S1, S2. Regular rate and rhythm. No peripheral edema, cyanosis or pallor.  Gastrointestinal:  Soft, nondistended, nontender. No rebound or guarding. Normal bowel sounds. No appreciable masses or hepatomegaly. Rectal:  Not performed.  Anoscopy: Msk:  Symmetrical without gross deformities. Without edema, no deformity or joint abnormality.  Neurologic:  Alert and  oriented x4;  grossly normal neurologically.  Skin:   Dry and intact without significant lesions or rashes. Psychiatric: Oriented to person, place and time. Demonstrates good judgement and reason without abnormal affect or behaviors.  RELEVANT LABS AND IMAGING: CBC    Latest Ref Rng & Units 03/04/2023    5:18 AM 03/04/2023    5:11 AM 02/09/2023   12:06 AM  CBC  WBC 4.0 - 10.5 K/uL  4.7    Hemoglobin 13.0 - 17.0 g/dL 78.2  95.6  21.3   Hematocrit 39.0 - 52.0 % 36.0  34.4  39.0   Platelets 150 - 400 K/uL  340       CMP     Latest Ref Rng & Units 03/04/2023    5:18 AM 03/04/2023    5:11 AM 02/09/2023    5:36 AM  CMP  Glucose 70 - 99 mg/dL  086  578   BUN 6 - 20 mg/dL  47  25   Creatinine  4.69 - 1.24 mg/dL  6.29  5.28   Sodium 413 - 145 mmol/L 137  134  137   Potassium 3.5 - 5.1 mmol/L 5.3  5.3  4.0   Chloride 98 - 111 mmol/L  108  107   CO2 22 - 32 mmol/L  18  21   Calcium 8.9 - 10.3 mg/dL  9.2  8.0   Total Protein 6.5 - 8.1 g/dL  7.0    Total Bilirubin 0.0 - 1.2 mg/dL  0.3    Alkaline Phos 38 - 126 U/L  78    AST 15 - 41 U/L  24    ALT 0 - 44 U/L  21      Lab Results  Component Value Date   LIPASE 68 (H) 03/04/2023     Lab Results  Component Value Date  TSH 1.988 02/26/2009   03/04/23 drug screen positive for Tetrahydrocannabinol   01/12/2023 CT ABD/pelvis W contrast IMPRESSION: 1. No acute findings are noted in the abdomen or pelvis to account for the patient's symptoms. 2. Atherosclerosis. 3. Additional incidental findings, as above. 11/23/22 CT ABDOMEN AND PELVIS WITH CONTRAST  IMPRESSION: No acute finding.  No bowel obstruction or visible inflammation. 11/2022 [place] ABD RUQ IMPRESSION: 1. No acute findings to account for the patient's symptoms. Specifically, no evidence of cholelithiasis and no findings to suggest an acute cholecystitis are noted at this time.  03/18/22 CT ABD/pelvis W contrast IMPRESSION: 1. No acute intra-abdominal process. 2. Mild thickening of the distal esophagus, possible esophagitis.    Assessment: 1. ***  Plan: -Schedule EGD. The risks and benefits of EGD with possible biopsies and esophageal dilation were discussed with the patient who agrees to proceed. Schedule for a colonoscopy. The risks and benefits of colonoscopy with possible polypectomy / biopsies were discussed and the patient agrees to proceed.       Thank you for the courtesy of this consult. Please call me with any questions or concerns.   Adriauna Campton, FNP-C [facility] 04/29/2023, 4:55 PM  Cc: [facility]

## 2023-04-30 ENCOUNTER — Ambulatory Visit: Payer: BLUE CROSS/BLUE SHIELD | Admitting: Gastroenterology

## 2023-12-10 NOTE — Progress Notes (Signed)
 Subjective    James Bradford is a 58 year old male here for Follow Up (Pt states he has concerns that he would like to discuss with the provider/Also stattes he needs refills on all his medications)   Pt into the office for follow-up.   September 19th - drug test in hair under his arm. Tested positive for cocaine.  Pt notes he has never done cocaine before in his lift. Does has a history of THC use.  Diabetes - Hbga1c - 11.2 today  Notes that when he drinks he is not taking his insulin      Diabetes Mellitus He presents for his follow-up diabetic visit. He has type 2 diabetes mellitus. No MedicAlert identification noted. His disease course has been fluctuating. There are no hypoglycemic associated symptoms. Pertinent negatives for diabetes include no chest pain, no weakness and no weight loss. There are no hypoglycemic complications. Symptoms are worsening. Diabetic complications include impotence. Risk factors for coronary artery disease include male sex, dyslipidemia and diabetes mellitus. Current diabetic treatment includes insulin  injections and oral agent (dual therapy). He is compliant with treatment some of the time. When asked about meal planning, he reported none. He has not had a previous visit with a dietitian. He rarely participates in exercise. An ACE inhibitor/angiotensin II receptor blocker is being taken. He does not see a podiatrist.Eye exam is current.      Documentation Reviewed by Any User: Tobacco  Allergies  Meds  Problems   Med Hx  Surg Hx  Fam Hx     General Medical History     Diagnosis Date Comment Source   DM (diabetes mellitus)      Essential hypertension      Hyperlipidemia         Family History     No family history on file.      Tobacco Use     Never smoked or used smokeless tobacco.   Passive Exposure: Never      Vaping Use     Never used       Alcohol Use     Not Currently.      Drug Use     Not Currently; Marijuana.       Sexual Activity     Not currently sexually active.      E-Cigarettes/Vaping     Questions Responses   e-Cigarette/Vaping Use Never user   Start Date    Passive Exposure    Quit Date    Counseling Given    Comments       E-Cigarette/Vaping Substances     Questions Responses   Nicotine No   THC No   CBD No   Flavoring No   Other       E-Cigarettes/Vaping Devices     Questions Responses   Disposable No   Pre-filled or Refillable Cartridge No   Refillable Tank No   Pre-filled Pod No   Other        Current Outpatient Medications  Medication Sig Dispense Refill   sildenafiL (VIAGRA) 100 mg tablet TAKE 1 TABLET BY MOUTH ONCE DAILY AS NEEDED FOR ERECTILE DYSFUNCTION NOT  TO  EXCEED  10  TABLETS  PER  MONTH 10 Tablet 0   dapagliflozin propanediol 5 mg tab Take 1 Tablet by mouth daily. 30 Tablet 3   glipiZIDE  (GLUCOTROL ) 10 mg tablet Take 1 Tablet by mouth 2 (two) times daily before a meal. 60 Tablet 3   lisinopriL  10 mg tablet Take 1  Tablet by mouth once daily. 30 Tablet 3   metFORMIN  (GLUCOPHAGE ) 1,000 mg tablet Take 1 Tablet by mouth 2 (two) times daily. 60 Tablet 3   pantoprazole  (PROTONIX ) 40 mg EC tablet Take 1 Tablet by mouth 2 (two) times daily. 60 Tablet 3   pravastatin  (PRAVACHOL ) 10 mg tablet Take 1 Tablet by mouth once daily. 30 Tablet 3   BASAGLAR  KWIKPEN U-100 INSULIN  100 unit/mL (3 mL) pen Inject 10 Units into the skin every morning 1 Pen 3   insulin  asp prt-insulin  aspart (NOVOLOG  MIX 70-30FLEXPEN U-100) 100 unit/mL (70-30) injection Inject 5 Units into the skin 2 (two) times daily before a meal 15 mL 3   pen needle, diabetic 32 gauge x 5/16 ndle Use 1 Each as directed daily Use with insulin  pen once daily 100 Each 3   blood-glucose meter,continuous (DEXCOM G7 RECEIVER) misc 1 Each by miscellaneous route every 15 (fifteen) days 4 Each 3   blood-glucose sensor (DEXCOM G7 SENSOR) devi 1 Each by miscellaneous route every 15 (fifteen) days 4  Each 3   dicyclomine  (BENTYL ) 10 mg capsule Take 10 mg by mouth 4 (four) times daily before meals and nightly     hyoscyamine  (LEVSIN ) 0.125 mg SL tablet Place 0.125 mg under the tongue every 4 to 6 (four to six) hours as needed for pain     loperamide  (IMODIUM ) 2 mg capsule Take 2 mg by mouth 3 (three) times daily as needed for diarrhea     ondansetron  ODT (ZOFRAN -ODT) 4 mg disintegrating tablet Take 4 mg by mouth 2 (two) times daily as needed for nausea     blood-glucose sensor (DEXCOM G6 SENSOR) devi 3 Each by miscellaneous route every 10 (ten) days Change every 10 days to use with the Dexcom G6 3 Each 11   blood-glucose transmitter devi 1 Each by miscellaneous route every 30 (thirty) days For use with Dexcom G6 meter 1 Each 11   DEXCOM G6 RECEIVER misc USE AS DIRECTED THREE TIMES DAILY TO  CHECK  BLOOD  GLUCOSE 1 Each 0   alcohol swab cap misc 1 Each by miscellaneous route 2 (two) times daily 100 Each 3   dicyclomine  (BENTYL ) 20 mg tablet Take 1 Tablet by mouth 2 (two) times daily 60 Tablet 0   Review of Systems  Constitutional:  Negative for weight loss.  Respiratory:  Negative for shortness of breath.   Cardiovascular:  Negative for chest pain.  Genitourinary:  Positive for impotence.  Neurological:  Negative for weakness.     Objective    BP 120/83 (Left Arm, Sitting, Regular Adult)  Pulse (!) 102  Temp 98.2 F (36.8 C)  Wt 176 lb 3.2 oz (79.9 kg)  SpO2 98%  BMI 27.60 kg/m  Smoking Status Never  BSA 1.94 m  Physical Exam  HENT:     Head: Normocephalic.   Cardiovascular:     Rate and Rhythm: Normal rate and regular rhythm.     Heart sounds: Normal heart sounds.  Pulmonary:     Breath sounds: Normal breath sounds.  Abdominal:     General: Bowel sounds are normal.     Palpations: Abdomen is soft.  Musculoskeletal:     Comments: No assistive device  Neurological:     Mental Status: He is alert and oriented to person, place, and time.  Psychiatric:         Mood and Affect: Mood normal.     Assessment and Plan    1. Uncontrolled type 2  diabetes mellitus with hyperglycemia, with long-term current use of insulin  (Primary) -     A1C, ALERE AFINION (POCT) Routine -     blood-glucose sensor (DEXCOM G7 SENSOR) devi; 1 Each by miscellaneous route every 15 (fifteen) days., Disp-4 Each, R-3  e-Prescribing  Dispense: 4 Each; Refill: 3 -     blood-glucose,receiver,cont (DEXCOM G7 RECEIVER) misc; 1 Each by miscellaneous route every 15 (fifteen) days., Disp-4 Each, R-3  e-Prescribing  Dispense: 4 Each; Refill: 3 -     BASAGLAR  KWIKPEN U-100 INSULIN  100 unit/mL (3 mL) pen; Inject 10 Units into the skin every morning., Disp-1 Pen, R-3, DAW  e-Prescribing, Long-term  Dispense: 1 Pen; Refill: 3 -     metFORMIN  (GLUCOPHAGE ) 1,000 mg tablet; Take 1 Tablet by mouth 2 (two) times daily., Disp-60 Tablet, R-3  e-Prescribing, Long-term  Dispense: 60 Tablet; Refill: 3 2. Primary hypertension 3. Dyslipidemia 4. Tobacco dependence 5. 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 Other orders -     glipiZIDE  (GLUCOTROL ) 10 mg tablet; Take 1 Tablet by mouth 2 (two) times daily before a meal., Disp-60 Tablet, R-3  e-Prescribing, Long-term  Dispense: 60 Tablet; Refill: 3 -     lisinopriL  10 mg tablet; Take 1 Tablet by mouth once daily., Disp-30 Tablet, R-3  e-Prescribing, Long-term  Dispense: 30 Tablet; Refill: 3 -     pravastatin  (PRAVACHOL ) 10 mg tablet; Take 1 Tablet by mouth once daily., Disp-30 Tablet, R-3  e-Prescribing, Long-term  Dispense: 30 Tablet; Refill: 3   Regulatory Documentation: The following were addressed in today's visit:  Lifestyle Measures: The patient was counseled regarding nutrition and physical activity counseling provided Tobacco counseling: provided tobacco cessation counseling  Counseling time: < 3 minutes

## 2024-02-08 ENCOUNTER — Other Ambulatory Visit: Payer: Self-pay

## 2024-02-08 ENCOUNTER — Encounter (HOSPITAL_COMMUNITY): Payer: Self-pay | Admitting: Emergency Medicine

## 2024-02-08 ENCOUNTER — Emergency Department (HOSPITAL_COMMUNITY)
Admission: EM | Admit: 2024-02-08 | Discharge: 2024-02-09 | Disposition: A | Discharge status: Home / Self Care | Attending: Emergency Medicine | Admitting: Emergency Medicine

## 2024-02-08 DIAGNOSIS — Z794 Long term (current) use of insulin: Secondary | ICD-10-CM | POA: Diagnosis not present

## 2024-02-08 DIAGNOSIS — R109 Unspecified abdominal pain: Secondary | ICD-10-CM

## 2024-02-08 DIAGNOSIS — R1084 Generalized abdominal pain: Secondary | ICD-10-CM | POA: Insufficient documentation

## 2024-02-08 LAB — COMPREHENSIVE METABOLIC PANEL WITH GFR
ALT: 23 U/L (ref 0–44)
AST: 37 U/L (ref 15–41)
Albumin: 4.6 g/dL (ref 3.5–5.0)
Alkaline Phosphatase: 86 U/L (ref 38–126)
Anion gap: 15 (ref 5–15)
BUN: 17 mg/dL (ref 6–20)
CO2: 20 mmol/L — ABNORMAL LOW (ref 22–32)
Calcium: 9.5 mg/dL (ref 8.9–10.3)
Chloride: 105 mmol/L (ref 98–111)
Creatinine, Ser: 1.28 mg/dL — ABNORMAL HIGH (ref 0.61–1.24)
GFR, Estimated: >60 mL/min
Glucose, Bld: 142 mg/dL — ABNORMAL HIGH (ref 70–99)
Potassium: 4.8 mmol/L (ref 3.5–5.1)
Sodium: 140 mmol/L (ref 135–145)
Total Bilirubin: <0.2 mg/dL (ref 0.0–1.2)
Total Protein: 8 g/dL (ref 6.5–8.1)

## 2024-02-08 LAB — CBC
HCT: 32.3 % — ABNORMAL LOW (ref 39.0–52.0)
Hemoglobin: 10.7 g/dL — ABNORMAL LOW (ref 13.0–17.0)
MCH: 33.1 pg (ref 26.0–34.0)
MCHC: 33.1 g/dL (ref 30.0–36.0)
MCV: 100 fL (ref 80.0–100.0)
Platelets: 358 K/uL (ref 150–400)
RBC: 3.23 MIL/uL — ABNORMAL LOW (ref 4.22–5.81)
RDW: 14 % (ref 11.5–15.5)
WBC: 5.7 K/uL (ref 4.0–10.5)
nRBC: 0 % (ref 0.0–0.2)

## 2024-02-08 LAB — URINALYSIS, MICROSCOPIC (REFLEX): WBC, UA: NONE SEEN WBC/hpf (ref 0–5)

## 2024-02-08 LAB — URINALYSIS, ROUTINE W REFLEX MICROSCOPIC
Bilirubin Urine: NEGATIVE
Glucose, UA: 100 mg/dL — AB
Ketones, ur: NEGATIVE mg/dL
Leukocytes,Ua: NEGATIVE
Nitrite: NEGATIVE
Protein, ur: 100 mg/dL — AB
Specific Gravity, Urine: 1.025 (ref 1.005–1.030)
pH: 5.5 (ref 5.0–8.0)

## 2024-02-08 LAB — LIPASE, BLOOD: Lipase: 67 U/L — ABNORMAL HIGH (ref 11–51)

## 2024-02-08 NOTE — ED Triage Notes (Signed)
 Pt arrive POV c/o generalized abd pain that started this morning. Pt denies any fever, chills, nausea or vomiting.

## 2024-02-09 ENCOUNTER — Emergency Department (HOSPITAL_COMMUNITY)

## 2024-02-09 MED ORDER — IOHEXOL 350 MG/ML SOLN
75.0000 mL | Freq: Once | INTRAVENOUS | Status: AC | PRN
  Administered 2024-02-09: 75 mL via INTRAVENOUS

## 2024-02-09 MED ORDER — PANTOPRAZOLE SODIUM 20 MG PO TBEC: 20.0000 mg | DELAYED_RELEASE_TABLET | Freq: Every day | ORAL | 0 refills | Status: AC

## 2024-02-09 MED ORDER — LACTATED RINGERS IV BOLUS
1000.0000 mL | Freq: Once | INTRAVENOUS | Status: AC
  Administered 2024-02-09: 1000 mL via INTRAVENOUS

## 2024-02-09 MED ORDER — OXYCODONE-ACETAMINOPHEN 5-325 MG PO TABS
1.0000 | ORAL_TABLET | Freq: Once | ORAL | Status: AC
  Administered 2024-02-09: 1 via ORAL
  Filled 2024-02-09: qty 1

## 2024-02-09 NOTE — ED Provider Triage Note (Signed)
 Emergency Medicine Provider Triage Evaluation Note  James Bradford , a 59 y.o. male  was evaluated in triage.  Pt complains of abd pain started yesterday morning. 6/10. No NVD. No abd surgeries. Last BM yesterday and normal  Review of Systems  Positive: See hpi Negative:   Physical Exam  BP (!) 165/99 (BP Location: Right Arm)   Pulse 80   Temp 98.9 F (37.2 C) (Oral)   Resp 16   Ht 5' 7 (1.702 m)   Wt 80 kg   SpO2 100%   BMI 27.62 kg/m  Gen:   Awake, no distress   Resp:  Normal effort  MSK:   Moves extremities without difficulty  Other:  Generalized abd tenderness  Medical Decision Making  Medically screening exam initiated at 7:12 AM.  Appropriate orders placed.  James Bradford was informed that the remainder of the evaluation will be completed by another provider, this initial triage assessment does not replace that evaluation, and the importance of remaining in the ED until their evaluation is complete.  Labs notable for mildly elevated lipase, hgb 10.7 Ordered ct Percocet for pain   James Bradford, James Bradford 02/09/24 [phone]

## 2024-02-09 NOTE — ED Notes (Signed)
Pt verbalized understanding of discharge instructions. Opportunity for questions provided.    Pt given a bus pass.

## 2024-02-09 NOTE — Discharge Instructions (Signed)
 Your CT scan did not show any acute abnormality.  No signs of blockage or acute infection.  Your hemoglobin was a little lower than previous values.  I would recommend following up with your primary care doctor to have that rechecked.  Return to the emergency room if you notice blood in your stool or dark tarry stools.  Consider following up with a GI doctor for further evaluation

## 2024-02-09 NOTE — ED Provider Notes (Signed)
 " [place] EMERGENCY DEPARTMENT AT [facility] Provider Note   CSN: [id number] Arrival date & time: 02/08/24  2211     Patient presents with: Abdominal Pain   James Bradford is a 59 y.o. male.   The history is provided by the patient.  Abdominal Pain Pain location:  Generalized    No blood in the stool.  Some dark color yesterday.  The pain is all over his abdomen.  Not focal in 1 specific area.  No vomiting or nausea.  No dysuria.  No fevers.Patient denies any prior abdominal surgeries.  He states he is hungry right now.  The other day he thought he had some swelling in his ankles but that has resolved  Prior to Admission medications  Medication Sig Start Date End Date Taking? Authorizing Provider  pantoprazole  (PROTONIX ) 20 MG tablet Take 1 tablet (20 mg total) by mouth daily. 02/09/24  Yes Randol Simmonds, MD  insulin  aspart protamine - aspart (NOVOLOG  70/30 MIX) (70-30) 100 UNIT/ML FlexPen Inject 5 Units into the skin 2 (two) times daily with a meal. 03/04/23   Raenelle Coria, MD  Insulin  Pen Needle 32G X 4 MM MISC Use as directed 3 (three) times daily. 03/04/23   Ghimire, Kuber, MD  KRILL OIL PO Take 1 capsule by mouth daily.    [provider]  lisinopril  (ZESTRIL ) 10 MG tablet Take 10 mg by mouth daily.    [provider]  metFORMIN  (GLUCOPHAGE ) 500 MG tablet Take 500 mg by mouth 2 (two) times daily. 12/01/22   [provider]  Multiple Vitamins-Minerals (MENS 50+ MULTIVITAMIN PO) Take 1 tablet by mouth daily.    [provider]  ondansetron  (ZOFRAN -ODT) 4 MG disintegrating tablet 4mg  ODT q4 hours prn nausea/vomit Patient taking differently: Take 4 mg by mouth 2 (two) times daily as needed for nausea or vomiting. 01/15/23   Pollina, Lonni PARAS, MD  pravastatin  (PRAVACHOL ) 10 MG tablet Take 1 tablet (10 mg total) by mouth daily. Patient taking differently: Take 10 mg by mouth at bedtime. 03/22/22   Sebastian Toribio GAILS, MD  sildenafil  (VIAGRA) 100 MG tablet Take 100 mg by mouth daily as needed (for E.D.).    [provider]    Allergies: Ibuprofen, Porcine (pork) protein-containing drug products, Whole blood, and Asa [aspirin]    Review of Systems  Gastrointestinal:  Positive for abdominal pain.    Updated Vital Signs BP (!) 164/83   Pulse 63   Temp 98.2 F (36.8 C) (Oral)   Resp 16   Ht 1.702 m (5' 7)   Wt 80 kg   SpO2 100%   BMI 27.62 kg/m   Physical Exam Vitals and nursing note reviewed.  Constitutional:      General: He is not in acute distress.    Appearance: He is well-developed.  HENT:     Head: Normocephalic and atraumatic.     Right Ear: External ear normal.     Left Ear: External ear normal.  Eyes:     General: No scleral icterus.       Right eye: No discharge.        Left eye: No discharge.     Conjunctiva/sclera: Conjunctivae normal.  Neck:     Trachea: No tracheal deviation.  Cardiovascular:     Rate and Rhythm: Normal rate and regular rhythm.  Pulmonary:     Effort: Pulmonary effort is normal. No respiratory distress.     Breath sounds: Normal breath sounds. No  stridor. No wheezing or rales.  Abdominal:     General: Bowel sounds are normal. There is no distension.     Palpations: Abdomen is soft.     Tenderness: There is generalized abdominal tenderness. There is no guarding or rebound.  Genitourinary:    Testes:        Right: Tenderness or swelling not present.        Left: Tenderness or swelling not present.  Musculoskeletal:        General: No tenderness or deformity.     Cervical back: Neck supple.  Skin:    General: Skin is warm and dry.     Findings: No rash.  Neurological:     General: No focal deficit present.     Mental Status: He is alert.     Cranial Nerves: No cranial nerve deficit, dysarthria or facial asymmetry.     Sensory: No sensory deficit.     Motor: No abnormal muscle tone or seizure activity.     Coordination: Coordination normal.   Psychiatric:        Mood and Affect: Mood normal.     (all labs ordered are listed, but only abnormal results are displayed) Labs Reviewed  LIPASE, BLOOD - Abnormal; Notable for the following components:      Result Value   Lipase 67 (*)    All other components within normal limits  COMPREHENSIVE METABOLIC PANEL WITH GFR - Abnormal; Notable for the following components:   CO2 20 (*)    Glucose, Bld 142 (*)    Creatinine, Ser 1.28 (*)    All other components within normal limits  CBC - Abnormal; Notable for the following components:   RBC 3.23 (*)    Hemoglobin 10.7 (*)    HCT 32.3 (*)    All other components within normal limits  URINALYSIS, ROUTINE W REFLEX MICROSCOPIC - Abnormal; Notable for the following components:   Glucose, UA 100 (*)    Hgb urine dipstick SMALL (*)    Protein, ur 100 (*)    All other components within normal limits  URINALYSIS, MICROSCOPIC (REFLEX) - Abnormal; Notable for the following components:   Bacteria, UA RARE (*)    All other components within normal limits    EKG: None  Radiology: CT ABDOMEN PELVIS W CONTRAST Result Date: 02/09/2024 CLINICAL DATA:  Abdominal pain. EXAM: CT ABDOMEN AND PELVIS WITH CONTRAST TECHNIQUE: Multidetector CT imaging of the abdomen and pelvis was performed using the standard protocol following bolus administration of intravenous contrast. RADIATION DOSE REDUCTION: This exam was performed according to the departmental dose-optimization program which includes automated exposure control, adjustment of the mA and/or kV according to patient size and/or use of iterative reconstruction technique. CONTRAST:  75mL OMNIPAQUE  IOHEXOL  350 MG/ML SOLN COMPARISON:  01/12/2023 FINDINGS: Lower chest: No acute abnormality. Hepatobiliary: No focal liver abnormality is seen. No gallstones, gallbladder wall thickening, or biliary dilatation. Pancreas: Unremarkable. No pancreatic ductal dilatation or surrounding inflammatory changes. Spleen:  Normal in size without focal abnormality. Adrenals/Urinary Tract: Normal bilateral adrenal glands. Kidneys demonstrate stable bilateral Bosniak 1 cysts with no complicating features. No hydronephrosis or renal calculi. The bladder is unremarkable. Stomach/Bowel: Bowel shows no evidence of obstruction, ileus, inflammation or lesion. The appendix is not discretely visualized. No free intraperitoneal air. Vascular/Lymphatic: No significant vascular findings are present. No enlarged abdominal or pelvic lymph nodes. Reproductive: Prostate is unremarkable. Other: No abdominal wall hernia or abnormality. No abdominopelvic ascites. Musculoskeletal: No acute or significant osseous findings. IMPRESSION:  1. No acute findings in the abdomen or pelvis. 2. Stable bilateral Bosniak 1 renal cysts with no complicating features. These require no follow-up. Electronically Signed   By: Marcey Moan M.D.   On: 02/09/2024 17:46     Procedures   Medications Ordered in the ED  oxyCODONE -acetaminophen  (PERCOCET/ROXICET) 5-325 MG per tablet 1 tablet (1 tablet Oral Given 02/09/24 1129)  lactated ringers  bolus 1,000 mL (1,000 mLs Intravenous New Bag/Given 02/09/24 1722)  iohexol  (OMNIPAQUE ) 350 MG/ML injection 75 mL (75 mLs Intravenous Contrast Given 02/09/24 1705)    Clinical Course as of 02/09/24 1805  Tue Feb 09, 2024  1624 Comprehensive metabolic panel(!) Cr decreased since last [JK]  1624 CBC(!) Decreased since last [JK]  1624 Urinalysis, Routine w reflex microscopic -Urine, Clean Catch(!) nl [JK]  1625 Lipase, blood(!) Similar to previous [JK]  1700 Pt declined rectal exam [JK]  1752 CT ABDOMEN PELVIS W CONTRAST CT scan does not show any acute findings. [JK]    Clinical Course User Index [JK] Randol Simmonds, MD                                 Medical Decision Making Differential diagnosis includes but not limited to pancreatitis, bowel obstruction diverticulitis, colitis  Amount and/or Complexity of Data  Reviewed Labs: ordered. Decision-making details documented in ED Course. Radiology:  Decision-making details documented in ED Course.  Risk Prescription drug management.   Patient presented to the ED with complaints of abdominal pain ongoing since yesterday.  Patient had diffuse tenderness on exam but he appeared comfortable in no distress.  Patient's laboratory tests did not show any signs of hepatitis or acute kidney injury.  He had slight elevation in his lipase but this was unchanged compared to previous values.  Urinalysis did not suggest infection.  Hemoglobin is slightly lower than previous.  Patient denies any blood in his stool.  He did mention episode of dark stools.  Discussed doing a rectal exam to evaluate further but patient declined.  CT scan was performed and does not show any acute abnormality.  Will try the patient on a course of antacids.  Will have him follow-up with his PCP to have his blood count rechecked.  Also gave him warning signs to return to the ED for blood in his stool dark-colored stools or worsening symptoms.  Will also provide the information for a gastroenterologist to have him call and schedule appointment    Final diagnoses:  Abdominal pain, unspecified abdominal location    ED Discharge Orders          Ordered    pantoprazole  (PROTONIX ) 20 MG tablet  Daily        02/09/24 1756               Randol Simmonds, MD 02/09/24 1805  "

## 2024-02-19 ENCOUNTER — Emergency Department (HOSPITAL_COMMUNITY)
Admission: EM | Admit: 2024-02-19 | Discharge: 2024-02-19 | Disposition: A | Discharge status: Home / Self Care | Attending: Emergency Medicine | Admitting: Emergency Medicine

## 2024-02-19 ENCOUNTER — Encounter (HOSPITAL_COMMUNITY): Payer: Self-pay

## 2024-02-19 DIAGNOSIS — Z79899 Other long term (current) drug therapy: Secondary | ICD-10-CM | POA: Insufficient documentation

## 2024-02-19 DIAGNOSIS — R252 Cramp and spasm: Secondary | ICD-10-CM | POA: Insufficient documentation

## 2024-02-19 DIAGNOSIS — E1165 Type 2 diabetes mellitus with hyperglycemia: Secondary | ICD-10-CM | POA: Diagnosis not present

## 2024-02-19 DIAGNOSIS — M7989 Other specified soft tissue disorders: Secondary | ICD-10-CM | POA: Insufficient documentation

## 2024-02-19 DIAGNOSIS — R809 Proteinuria, unspecified: Secondary | ICD-10-CM | POA: Diagnosis not present

## 2024-02-19 DIAGNOSIS — Z794 Long term (current) use of insulin: Secondary | ICD-10-CM | POA: Diagnosis not present

## 2024-02-19 DIAGNOSIS — D72819 Decreased white blood cell count, unspecified: Secondary | ICD-10-CM | POA: Diagnosis not present

## 2024-02-19 DIAGNOSIS — Z7984 Long term (current) use of oral hypoglycemic drugs: Secondary | ICD-10-CM | POA: Insufficient documentation

## 2024-02-19 DIAGNOSIS — R319 Hematuria, unspecified: Secondary | ICD-10-CM | POA: Diagnosis not present

## 2024-02-19 DIAGNOSIS — E1122 Type 2 diabetes mellitus with diabetic chronic kidney disease: Secondary | ICD-10-CM | POA: Diagnosis not present

## 2024-02-19 DIAGNOSIS — I129 Hypertensive chronic kidney disease with stage 1 through stage 4 chronic kidney disease, or unspecified chronic kidney disease: Secondary | ICD-10-CM | POA: Insufficient documentation

## 2024-02-19 DIAGNOSIS — N189 Chronic kidney disease, unspecified: Secondary | ICD-10-CM | POA: Insufficient documentation

## 2024-02-19 LAB — COMPREHENSIVE METABOLIC PANEL WITH GFR
ALT: 34 U/L (ref 0–44)
AST: 31 U/L (ref 15–41)
Albumin: 4.5 g/dL (ref 3.5–5.0)
Alkaline Phosphatase: 105 U/L (ref 38–126)
Anion gap: 13 (ref 5–15)
BUN: 25 mg/dL — ABNORMAL HIGH (ref 6–20)
CO2: 24 mmol/L (ref 22–32)
Calcium: 9.4 mg/dL (ref 8.9–10.3)
Chloride: 100 mmol/L (ref 98–111)
Creatinine, Ser: 1.51 mg/dL — ABNORMAL HIGH (ref 0.61–1.24)
GFR, Estimated: 54 mL/min — ABNORMAL LOW
Glucose, Bld: 311 mg/dL — ABNORMAL HIGH (ref 70–99)
Potassium: 4.2 mmol/L (ref 3.5–5.1)
Sodium: 137 mmol/L (ref 135–145)
Total Bilirubin: 0.5 mg/dL (ref 0.0–1.2)
Total Protein: 8.1 g/dL (ref 6.5–8.1)

## 2024-02-19 LAB — CBC WITH DIFFERENTIAL/PLATELET
Abs Immature Granulocytes: 0 K/uL (ref 0.00–0.07)
Basophils Absolute: 0 K/uL (ref 0.0–0.1)
Basophils Relative: 0 %
Eosinophils Absolute: 0.1 K/uL (ref 0.0–0.5)
Eosinophils Relative: 3 %
HCT: 33.9 % — ABNORMAL LOW (ref 39.0–52.0)
Hemoglobin: 11.1 g/dL — ABNORMAL LOW (ref 13.0–17.0)
Immature Granulocytes: 0 %
Lymphocytes Relative: 27 %
Lymphs Abs: 1 K/uL (ref 0.7–4.0)
MCH: 32.2 pg (ref 26.0–34.0)
MCHC: 32.7 g/dL (ref 30.0–36.0)
MCV: 98.3 fL (ref 80.0–100.0)
Monocytes Absolute: 0.4 K/uL (ref 0.1–1.0)
Monocytes Relative: 10 %
Neutro Abs: 2.1 K/uL (ref 1.7–7.7)
Neutrophils Relative %: 60 %
Platelets: 238 K/uL (ref 150–400)
RBC: 3.45 MIL/uL — ABNORMAL LOW (ref 4.22–5.81)
RDW: 13.3 % (ref 11.5–15.5)
WBC: 3.6 K/uL — ABNORMAL LOW (ref 4.0–10.5)
nRBC: 0 % (ref 0.0–0.2)

## 2024-02-19 LAB — BLOOD GAS, VENOUS
Acid-Base Excess: 3 mmol/L — ABNORMAL HIGH (ref 0.0–2.0)
Bicarbonate: 29 mmol/L — ABNORMAL HIGH (ref 20.0–28.0)
O2 Saturation: 44.4 %
Patient temperature: 37
pCO2, Ven: 49 mmHg (ref 44–60)
pH, Ven: 7.38 (ref 7.25–7.43)
pO2, Ven: 34 mmHg (ref 32–45)

## 2024-02-19 LAB — URINALYSIS, ROUTINE W REFLEX MICROSCOPIC
Bacteria, UA: NONE SEEN
Bilirubin Urine: NEGATIVE
Glucose, UA: >=500 mg/dL — AB
Ketones, ur: 5 mg/dL — AB
Leukocytes,Ua: NEGATIVE
Nitrite: NEGATIVE
Protein, ur: 100 mg/dL — AB
Specific Gravity, Urine: 1.018 (ref 1.005–1.030)
pH: 5 (ref 5.0–8.0)

## 2024-02-19 MED ORDER — LACTATED RINGERS IV BOLUS
1000.0000 mL | Freq: Once | INTRAVENOUS | Status: AC
  Administered 2024-02-19: 1000 mL via INTRAVENOUS

## 2024-02-19 MED ORDER — ACETAMINOPHEN 500 MG PO TABS
1000.0000 mg | ORAL_TABLET | Freq: Once | ORAL | Status: AC
  Administered 2024-02-19: 1000 mg via ORAL
  Filled 2024-02-19: qty 2

## 2024-02-19 MED ORDER — METHOCARBAMOL 500 MG PO TABS: 500.0000 mg | ORAL_TABLET | Freq: Three times a day (TID) | ORAL | 0 refills | Status: AC | PRN

## 2024-02-19 NOTE — ED Provider Notes (Signed)
 " [place] EMERGENCY DEPARTMENT AT [facility] Provider Note   CSN: [id number] Arrival date & time: 02/19/24  9084     Patient presents with: Leg Pain   James Bradford is a 59 y.o. male with a past medical history of type 2 diabetes, chronic kidney disease, hypertension, and hyperlipidemia who presents with bilateral leg pain/swelling, ongoing for several days.  The pain is described as intense cramping that is wax/wane in nature. The patient states that the pain is worse upon waking in the morning and improves with  movement/activity.  The patient denies a specific traumatic event however states he was forced to wait 23 hours in [facility] emergency department 10 days ago and his leg pain and swelling started when he had to sit and wait that long Patient states that he did walk around during the wait without difficulty. Patient has continued to be ambulatory since then at home without difficulty. There is no associated numbness, tingling, weakness, joint instability, deformity, or loss of function. There is no history of prior injury or surgery to the affected area.  The patient has tried drinking a gallon of water and eat an entire lemon for symptom relief with no improvement, however he states that it did give him diarrhea.  The patient is in no acute distress and is reading his book and watching TV while answering questions.     Leg Pain      Prior to Admission medications  Medication Sig Start Date End Date Taking? Authorizing Provider  methocarbamol  (ROBAXIN ) 500 MG tablet Take 1 tablet (500 mg total) by mouth every 8 (eight) hours as needed for up to 7 days for muscle spasms. 02/19/24 02/26/24 Yes Camrin Lapre L, PA  insulin  aspart protamine - aspart (NOVOLOG  70/30 MIX) (70-30) 100 UNIT/ML FlexPen Inject 5 Units into the skin 2 (two) times daily with a meal. 03/04/23   Raenelle Coria, MD  Insulin  Pen Needle 32G X 4 MM MISC Use as directed 3 (three) times daily. 03/04/23    Ghimire, Kuber, MD  KRILL OIL PO Take 1 capsule by mouth daily.    [provider]  lisinopril  (ZESTRIL ) 10 MG tablet Take 10 mg by mouth daily.    [provider]  metFORMIN  (GLUCOPHAGE ) 500 MG tablet Take 500 mg by mouth 2 (two) times daily. 12/01/22   [provider]  Multiple Vitamins-Minerals (MENS 50+ MULTIVITAMIN PO) Take 1 tablet by mouth daily.    [provider]  ondansetron  (ZOFRAN -ODT) 4 MG disintegrating tablet 4mg  ODT q4 hours prn nausea/vomit Patient taking differently: Take 4 mg by mouth 2 (two) times daily as needed for nausea or vomiting. 01/15/23   Pollina, Lonni PARAS, MD  pantoprazole  (PROTONIX ) 20 MG tablet Take 1 tablet (20 mg total) by mouth daily. 02/09/24   Randol Simmonds, MD  pravastatin  (PRAVACHOL ) 10 MG tablet Take 1 tablet (10 mg total) by mouth daily. Patient taking differently: Take 10 mg by mouth at bedtime. 03/22/22   Sebastian Toribio GAILS, MD  sildenafil (VIAGRA) 100 MG tablet Take 100 mg by mouth daily as needed (for E.D.).    [provider]    Allergies: Ibuprofen, Porcine (pork) protein-containing drug products, Whole blood, and Asa [aspirin]    Review of Systems  Musculoskeletal:  Positive for myalgias.    Updated Vital Signs BP (!) 140/90   Pulse 82   Temp 98.2 F (36.8 C) (Oral)   Resp 18   SpO2 100%   Physical Exam Vitals  and nursing note reviewed.  Constitutional:      General: He is not in acute distress.    Appearance: Normal appearance.  HENT:     Head: Normocephalic and atraumatic.  Eyes:     Extraocular Movements: Extraocular movements intact.     Conjunctiva/sclera: Conjunctivae normal.     Pupils: Pupils are equal, round, and reactive to light.  Cardiovascular:     Rate and Rhythm: Normal rate and regular rhythm.     Pulses: Normal pulses.          Dorsalis pedis pulses are 2+ on the right side and 2+ on the left side.  Pulmonary:     Effort: Pulmonary effort is normal. No respiratory  distress.     Comments: Patient has no difficulty speaking in complete sentences. Abdominal:     General: Abdomen is flat.     Palpations: Abdomen is soft.     Tenderness: There is no abdominal tenderness.  Musculoskeletal:        General: Normal range of motion.     Cervical back: Normal range of motion.     Right lower leg: No deformity, tenderness or bony tenderness. No edema.     Left lower leg: No deformity, tenderness or bony tenderness. No edema.     Comments: Patient endorses discomfort with straight leg raise/stretching.  No edema noted to lower extremities bilaterally.  Neurovascular intact.  Skin:    General: Skin is warm and dry.     Capillary Refill: Capillary refill takes less than 2 seconds.  Neurological:     General: No focal deficit present.     Mental Status: He is alert. Mental status is at baseline.     Comments: Patient alert and oriented.  Speech clear and appropriate.  No aphasia or dysarthria.   Cranial nerves III through XII intact: Motor strength 5-5 in all extremities with normal tone and no pronator drift.   Sensation intact to light touch in upper and lower extremities bilaterally.  Coordination normal with intact finger-nose. Gait steady and non-ataxic when ambulated in ED per RN staff. No focal neurologic deficits appreciated.   Psychiatric:        Mood and Affect: Mood normal.     (all labs ordered are listed, but only abnormal results are displayed) Labs Reviewed  CBC WITH DIFFERENTIAL/PLATELET - Abnormal; Notable for the following components:      Result Value   WBC 3.6 (*)    RBC 3.45 (*)    Hemoglobin 11.1 (*)    HCT 33.9 (*)    All other components within normal limits  COMPREHENSIVE METABOLIC PANEL WITH GFR - Abnormal; Notable for the following components:   Glucose, Bld 311 (*)    BUN 25 (*)    Creatinine, Ser 1.51 (*)    GFR, Estimated 54 (*)    All other components within normal limits  URINALYSIS, ROUTINE W REFLEX MICROSCOPIC -  Abnormal; Notable for the following components:   Glucose, UA >=500 (*)    Hgb urine dipstick SMALL (*)    Ketones, ur 5 (*)    Protein, ur 100 (*)    All other components within normal limits  BLOOD GAS, VENOUS - Abnormal; Notable for the following components:   Bicarbonate 29.0 (*)    Acid-Base Excess 3.0 (*)    All other components within normal limits    EKG: None  Radiology: No results found.   Procedures   Medications Ordered in the ED  acetaminophen  (TYLENOL ) tablet 1,000 mg (1,000 mg Oral Given 02/19/24 1012)  lactated ringers  bolus 1,000 mL (0 mLs Intravenous Stopped 02/19/24 1316)                                 Medical Decision Making Amount and/or Complexity of Data Reviewed Labs: ordered. Decision-making details documented in ED Course.  Risk OTC drugs. Prescription drug management.   Patient presents to the ED for: Lateral leg cramping/swelling This involves an extensive number of treatment options  Differential diagnosis includes:  Minor, MSK etiology Gravitational pedal edema Venous insufficiency DVT Co-morbid conditions: Type 2 diabetes, CKD, hypertension  Clinical Course as of 02/19/24 1350  Fri Feb 19, 2024  1022 Temp: 98.2 F (36.8 C) Afebrile, vital stable, patient no acute distress [ML]  1022 Patient given acetaminophen  and LR fluid bolus for symptomatic relief [ML]  1054 CBC with Differential(!) Leukopenia [ML]  1100 Comprehensive metabolic panel(!) Hyperglycemia, patient creatine elevated from baseline [ML]  1349 Urinalysis, Routine w reflex microscopic -Urine, Clean Catch(!) microhematuria, proteinuria [ML]  1349 Blood gas, venous (at [facility] and [facility])(!) No acute findings  [ML]    Clinical Course User Index [ML] Willma Duwaine CROME, PA    Data Reviewed / Actions Taken: Labs ordered/reviewed with my independent interpretation in ED course above.  Management / Treatments: See ED course above for medications, treatments administered, and  clinical rationale.   Reevaluation of the patient after these medicines showed that the patient improved. I have reviewed the patients home medicines and have made adjustments as needed  ED Course / Reassessments: Problem List:  59 year old male presented for bilateral lower extremity cramping. Initial assessment included history, physical exam, and review of prior medical records. Based on the reassuring exam findings, there was low clinical suspicion for fracture, dislocation, septic joint, compartment syndrome, DVT, or neurovascular injury.  Ultrasound imaging was deemed not necessary at this time given that patient had no swelling to bilateral lower extremities, positive straight leg raise and pain reproducible with movement/stretching, patient has not recently been bedridden, and no history of previous DVT.  The patient was treated conservatively with analgesia, IV fluids, supportive care, with no worsening of symptoms during ED course.  Laboratory studies were obtained given patient's concern for possible electrolyte imbalance, which showed that patient was hyperglycemic with some mild leukopenia and an elevated creatinine from baseline-patient reports that he has not been compliant with insulin  regimen.  No signs of impending DKA at this time. The patient was deemed appropriate for discharge with a diagnosis of likely musculoskeletal strain or possible diabetic neuropathy. Discharge instructions including rest, activity modification, ice/heat as appropriate, and use of acetaminophen , and muscle relaxers for pain control. The patient was advised to follow-up with primary care if symptoms persist or worsen.  Strict return precautions were provided for increasing pain, swelling, redness, fever, numbness, weakness or loss of function.     Social determinants impacting care: limited/at-risk for follow-up  Disposition: Disposition: Discharge with close follow-up PCP for further evaluation and  care Rationale for disposition: Stable for discharge The disposition plan and rationale were discussed with the patient at the bedside, all questions were addressed, and the patient demonstrated understanding.  This note was produced using Electronics Engineer. While I have reviewed and verified all clinical information, transcription errors may remain.      Final diagnoses:  Leg cramping    ED Discharge Orders  Ordered    methocarbamol  (ROBAXIN ) 500 MG tablet  Every 8 hours PRN        02/19/24 1310               Suzy Kugel L, [place] 02/19/24 1404  "

## 2024-02-19 NOTE — ED Notes (Signed)
 Pt ambulatory to bathroom with no assistance

## 2024-02-19 NOTE — ED Triage Notes (Signed)
 Pt presents with c/o leg cramping and swelling. Pt reports he was seen yesterday at [facility] and waiting in the lobby for so long is what caused the cramping and swelling.

## 2024-02-19 NOTE — ED Notes (Signed)
 Writer asked if pt could provide a urine sample. Pt advised not at this time.

## 2024-02-19 NOTE — Discharge Instructions (Addendum)
 Thank you for visiting the Emergency Department today. It was a pleasure to be part of your healthcare team.   Your were seen today for leg cramping, and your test results showed that your blood sugar was elevated  As discussed, rest, hydrate, and resume diet as normal.  You may utilize over-the-counter pain medication such as Tylenol  for symptomatic relief, you have been prescribed a short course of Robaxin  as needed for cramps.  You may also utilize Voltaren gel that you can pick up at the pharmacy for any additional muscle pain. you should take your medications as directed. If you have any questions about your medicines, please call your pharmacy or healthcare provider.   It is important to watch for warning signs such as worsening pain, fever, or difficulty walking/standing. If any of these happen, return to the Emergency Department or call 911.  Thank you for trusting us  with your health.

## 2024-02-23 ENCOUNTER — Other Ambulatory Visit: Payer: Self-pay

## 2024-02-23 ENCOUNTER — Emergency Department (HOSPITAL_COMMUNITY)
Admission: EM | Admit: 2024-02-23 | Discharge: 2024-02-24 | Disposition: A | Discharge status: Home / Self Care | Attending: Emergency Medicine | Admitting: Emergency Medicine

## 2024-02-23 DIAGNOSIS — Z7984 Long term (current) use of oral hypoglycemic drugs: Secondary | ICD-10-CM | POA: Diagnosis not present

## 2024-02-23 DIAGNOSIS — Z794 Long term (current) use of insulin: Secondary | ICD-10-CM | POA: Insufficient documentation

## 2024-02-23 DIAGNOSIS — R109 Unspecified abdominal pain: Secondary | ICD-10-CM | POA: Diagnosis present

## 2024-02-23 DIAGNOSIS — N183 Chronic kidney disease, stage 3 unspecified: Secondary | ICD-10-CM | POA: Insufficient documentation

## 2024-02-23 DIAGNOSIS — I129 Hypertensive chronic kidney disease with stage 1 through stage 4 chronic kidney disease, or unspecified chronic kidney disease: Secondary | ICD-10-CM | POA: Diagnosis not present

## 2024-02-23 DIAGNOSIS — R1084 Generalized abdominal pain: Secondary | ICD-10-CM | POA: Diagnosis not present

## 2024-02-23 DIAGNOSIS — K219 Gastro-esophageal reflux disease without esophagitis: Secondary | ICD-10-CM | POA: Insufficient documentation

## 2024-02-23 DIAGNOSIS — Z79899 Other long term (current) drug therapy: Secondary | ICD-10-CM | POA: Insufficient documentation

## 2024-02-23 DIAGNOSIS — E1143 Type 2 diabetes mellitus with diabetic autonomic (poly)neuropathy: Secondary | ICD-10-CM | POA: Insufficient documentation

## 2024-02-23 LAB — CBC
HCT: 35.1 % — ABNORMAL LOW (ref 39.0–52.0)
Hemoglobin: 11.8 g/dL — ABNORMAL LOW (ref 13.0–17.0)
MCH: 32.9 pg (ref 26.0–34.0)
MCHC: 33.6 g/dL (ref 30.0–36.0)
MCV: 97.8 fL (ref 80.0–100.0)
Platelets: 277 K/uL (ref 150–400)
RBC: 3.59 MIL/uL — ABNORMAL LOW (ref 4.22–5.81)
RDW: 13.6 % (ref 11.5–15.5)
WBC: 6.6 K/uL (ref 4.0–10.5)
nRBC: 0 % (ref 0.0–0.2)

## 2024-02-23 NOTE — ED Triage Notes (Signed)
 Pt bib pov c/o abdominal pain that started 22:30 after taking a muscle relaxant pill. Pt states this is his first dose. Pt denies sob, hives, or itching. Pt states he is having intense abdominal pain.

## 2024-02-24 ENCOUNTER — Emergency Department (HOSPITAL_COMMUNITY)

## 2024-02-24 LAB — URINALYSIS, ROUTINE W REFLEX MICROSCOPIC
Bacteria, UA: NONE SEEN
Bilirubin Urine: NEGATIVE
Glucose, UA: >=500 mg/dL — AB
Hgb urine dipstick: NEGATIVE
Ketones, ur: NEGATIVE mg/dL
Leukocytes,Ua: NEGATIVE
Nitrite: NEGATIVE
Protein, ur: 100 mg/dL — AB
Specific Gravity, Urine: 1.016 (ref 1.005–1.030)
pH: 5 (ref 5.0–8.0)

## 2024-02-24 LAB — COMPREHENSIVE METABOLIC PANEL WITH GFR
ALT: 25 U/L (ref 0–44)
AST: 26 U/L (ref 15–41)
Albumin: 4.7 g/dL (ref 3.5–5.0)
Alkaline Phosphatase: 102 U/L (ref 38–126)
Anion gap: 15 (ref 5–15)
BUN: 32 mg/dL — ABNORMAL HIGH (ref 6–20)
CO2: 22 mmol/L (ref 22–32)
Calcium: 9.7 mg/dL (ref 8.9–10.3)
Chloride: 99 mmol/L (ref 98–111)
Creatinine, Ser: 1.46 mg/dL — ABNORMAL HIGH (ref 0.61–1.24)
GFR, Estimated: 56 mL/min — ABNORMAL LOW
Glucose, Bld: 224 mg/dL — ABNORMAL HIGH (ref 70–99)
Potassium: 4.7 mmol/L (ref 3.5–5.1)
Sodium: 136 mmol/L (ref 135–145)
Total Bilirubin: 0.3 mg/dL (ref 0.0–1.2)
Total Protein: 8.4 g/dL — ABNORMAL HIGH (ref 6.5–8.1)

## 2024-02-24 LAB — CK: Total CK: 292 U/L (ref 49–397)

## 2024-02-24 LAB — LIPASE, BLOOD: Lipase: 82 U/L — ABNORMAL HIGH (ref 11–51)

## 2024-02-24 MED ORDER — ALUM & MAG HYDROXIDE-SIMETH 200-200-20 MG/5ML PO SUSP
30.0000 mL | Freq: Once | ORAL | Status: AC
  Administered 2024-02-24: 30 mL via ORAL
  Filled 2024-02-24: qty 30

## 2024-02-24 MED ORDER — IOHEXOL 350 MG/ML SOLN
75.0000 mL | Freq: Once | INTRAVENOUS | Status: AC | PRN
  Administered 2024-02-24: 75 mL via INTRAVENOUS

## 2024-02-24 MED ORDER — SODIUM CHLORIDE 0.9 % IV BOLUS
1000.0000 mL | Freq: Once | INTRAVENOUS | Status: AC
  Administered 2024-02-24: 1000 mL via INTRAVENOUS

## 2024-02-24 NOTE — ED Provider Notes (Signed)
 " [place] EMERGENCY DEPARTMENT AT [facility] Provider Note   CSN: [id number] Arrival date & time: 02/23/24  2308     Patient presents with: Abdominal Pain   James Bradford is a 59 y.o. male with history of hypertension, type 2 diabetes, stage III CKD, anemia, nausea and vomiting, alcohol abuse, diabetic gastroparesis, GERD.  Patient presents to ED for evaluation of abdominal pain.  Per triage note, patient reporting that he started having abdominal pain around 1030 after taking muscle relaxant pill.  Reports this is his first dose.  On my exam, the patient when asked why he is in the department reports he is here for cramping of my hands and my feet.  Patient goes on to state that he is also for abdominal pain that began after taking muscle relaxer.  On further questioning, patient reports that muscle relaxer was taken due to cramping which is been present for over 1 week which he has seen PCP for.  Patient denies any new features to hand or feet cramping this evening.  He also reports he began having abdominal pain after taking muscle relaxer.  Denies nausea, vomiting or diarrhea.  Denies fevers at home.  Denies dysuria.  Denies chest pain or shortness of breath.  Denies drugs or alcohol.  Reports that abdominal pain has largely decreased from initially when he presented.   Abdominal Pain      Prior to Admission medications  Medication Sig Start Date End Date Taking? Authorizing Provider  insulin  aspart protamine - aspart (NOVOLOG  70/30 MIX) (70-30) 100 UNIT/ML FlexPen Inject 5 Units into the skin 2 (two) times daily with a meal. 03/04/23   Raenelle Coria, MD  Insulin  Pen Needle 32G X 4 MM MISC Use as directed 3 (three) times daily. 03/04/23   Ghimire, Kuber, MD  KRILL OIL PO Take 1 capsule by mouth daily.    [provider]  lisinopril  (ZESTRIL ) 10 MG tablet Take 10 mg by mouth daily.    [provider]  metFORMIN  (GLUCOPHAGE ) 500 MG tablet Take 500 mg by mouth  2 (two) times daily. 12/01/22   [provider]  methocarbamol  (ROBAXIN ) 500 MG tablet Take 1 tablet (500 mg total) by mouth every 8 (eight) hours as needed for up to 7 days for muscle spasms. 02/19/24 02/26/24  Lloyd, Megan L, PA  Multiple Vitamins-Minerals (MENS 50+ MULTIVITAMIN PO) Take 1 tablet by mouth daily.    [provider]  ondansetron  (ZOFRAN -ODT) 4 MG disintegrating tablet 4mg  ODT q4 hours prn nausea/vomit Patient taking differently: Take 4 mg by mouth 2 (two) times daily as needed for nausea or vomiting. 01/15/23   Pollina, Lonni PARAS, MD  pantoprazole  (PROTONIX ) 20 MG tablet Take 1 tablet (20 mg total) by mouth daily. 02/09/24   Randol Simmonds, MD  pravastatin  (PRAVACHOL ) 10 MG tablet Take 1 tablet (10 mg total) by mouth daily. Patient taking differently: Take 10 mg by mouth at bedtime. 03/22/22   Sebastian Toribio GAILS, MD  sildenafil (VIAGRA) 100 MG tablet Take 100 mg by mouth daily as needed (for E.D.).    [provider]    Allergies: Ibuprofen, Porcine (pork) protein-containing drug products, Whole blood, and Asa [aspirin]    Review of Systems  Gastrointestinal:  Positive for abdominal pain.  All other systems reviewed and are negative.   Updated Vital Signs BP 98/67   Pulse 76   Temp 98.5 F (36.9 C) (Axillary)   Resp 16   SpO2 100%   Physical  Exam Vitals and nursing note reviewed.  Constitutional:      General: He is not in acute distress.    Appearance: He is well-developed.  HENT:     Head: Normocephalic and atraumatic.  Eyes:     Conjunctiva/sclera: Conjunctivae normal.  Cardiovascular:     Rate and Rhythm: Normal rate and regular rhythm.     Heart sounds: No murmur heard. Pulmonary:     Effort: Pulmonary effort is normal. No respiratory distress.     Breath sounds: Normal breath sounds.  Abdominal:     Palpations: Abdomen is soft.     Tenderness: There is no abdominal tenderness.     Comments: Abdomen soft and nontender.  No  overlying skin change.  No ascites or distention.  No rebound or guarding.  Musculoskeletal:        General: No swelling.     Cervical back: Neck supple.  Skin:    General: Skin is warm and dry.     Capillary Refill: Capillary refill takes less than 2 seconds.  Neurological:     Mental Status: He is alert.  Psychiatric:        Mood and Affect: Mood normal.     (all labs ordered are listed, but only abnormal results are displayed) Labs Reviewed  LIPASE, BLOOD - Abnormal; Notable for the following components:      Result Value   Lipase 82 (*)    All other components within normal limits  COMPREHENSIVE METABOLIC PANEL WITH GFR - Abnormal; Notable for the following components:   Glucose, Bld 224 (*)    BUN 32 (*)    Creatinine, Ser 1.46 (*)    Total Protein 8.4 (*)    GFR, Estimated 56 (*)    All other components within normal limits  CBC - Abnormal; Notable for the following components:   RBC 3.59 (*)    Hemoglobin 11.8 (*)    HCT 35.1 (*)    All other components within normal limits  URINALYSIS, ROUTINE W REFLEX MICROSCOPIC - Abnormal; Notable for the following components:   Color, Urine AMBER (*)    APPearance CLOUDY (*)    Glucose, UA >=500 (*)    Protein, ur 100 (*)    All other components within normal limits  CK    EKG: None  Radiology: CT ABDOMEN PELVIS W CONTRAST Result Date: 02/24/2024 EXAM: CT ABDOMEN AND PELVIS WITH CONTRAST 02/24/2024 02:56:18 AM TECHNIQUE: CT of the abdomen and pelvis was performed with the administration of intravenous contrast. Multiplanar reformatted images are provided for review. Automated exposure control, iterative reconstruction, and/or weight-based adjustment of the mA/kV was utilized to reduce the radiation dose to as low as reasonably achievable. COMPARISON: CT with IV contrast 02/09/2024 and 01/12/2023. CLINICAL HISTORY: Abdominal pain, acute, nonlocalized FINDINGS: LOWER CHEST: No acute abnormality. LIVER: The liver is mildly  steatotic. There is no mass enhancement. GALLBLADDER AND BILE DUCTS: Gallbladder is unremarkable. No biliary ductal dilatation. SPLEEN: No acute abnormality. PANCREAS: No acute abnormality. ADRENAL GLANDS: There is no adrenal mass. KIDNEYS, URETERS AND BLADDER: No renal mass enhancement. Multiple bilateral small Bosniak 1 and too small to characterize hypodense Bosniak 2 cortical cysts of both kidneys are redemonstrated. Per consensus, no follow-up is needed for simple Bosniak type 1 and 2 renal cysts, unless the patient has a malignancy history or risk factors. Symmetric renal excretion on delayed images. The bladder has a thickened wall and is not fully distended. Correlate clinically for hypertrophy or cystitis versus nondistention.  GI AND BOWEL: Unremarkable contracted stomach. No bowel obstruction or inflammation is seen. The appendix is not seen in this patient. PERITONEUM AND RETROPERITONEUM: There is no free hemorrhage, free air, incarcerated hernia, or localizing inflammatory process. VASCULATURE: trace Abdominal aortic atherosclerosis without aneurysm. LYMPH NODES: No lymphadenopathy. REPRODUCTIVE ORGANS: Prostate size is normal. BONES AND SOFT TISSUES: No acute or significant osseous findings. Left paracentral L5-S1 disc protrusion again noted with mild mass effect on the left S1 nerve root. Pelvic phleboliths. IMPRESSION: 1. No acute findings in the abdomen or pelvis. 2. Thickened bladder wall, not fully distended; consider hypertrophy versus cystitis versus underdistention. 3. L5-S1 left paracentral disc protrusion with mild mass effect on the left S1 nerve root. Electronically signed by: Francis Quam MD 02/24/2024 03:22 AM EST RP Workstation: [id number]    Procedures   Medications Ordered in the ED  sodium chloride  0.9 % bolus 1,000 mL (0 mLs Intravenous Stopped 02/24/24 0411)  alum & mag hydroxide-simeth (MAALOX/MYLANTA) 200-200-20 MG/5ML suspension 30 mL (30 mLs Oral Given 02/24/24 0216)   iohexol  (OMNIPAQUE ) 350 MG/ML injection 75 mL (75 mLs Intravenous Contrast Given 02/24/24 0256)     Medical Decision Making Amount and/or Complexity of Data Reviewed Labs: ordered. Radiology: ordered.  Risk OTC drugs. Prescription drug management.   This is a 59 year old male presenting to the ED due to concerns of abdominal pain which started after taking muscle relaxer.  On exam, HD stable.  Neuroexam at baseline.  Afebrile and nontachycardic.  Abdomen soft and compressible with no tenderness noted.  Overall patient nontoxic in appearance resting comfortably watching TV on his phone.  Labs assessed.  CBC is without leukocytosis, there is a baseline hemoglobin.  Metabolic panel shows baseline creatinine 1.46 with GFR 56, anion gap 15, glucose 224, BUN 32.  No elevated LFTs.  No electrolyte derangement.  Urinalysis with glucose, protein but no bacteria.  Lipase 82 which is elevated from baseline spoke like CT scan of abdomen.  CK2 92.  Imaging of abdomen shows no acute findings.  There is a thickened bladder wall which is not fully distended which could be related to cystitis but patient urinalysis does not confirm this or support this.  At this time patient resting comfortably in bed.  Patient advised that his workup is reassuring and he has no symptoms at this time he reports.  Patient will be discharged home, advised to follow-up outpatient with PCP.  Given strict return precautions and he voiced understanding.  Stable to discharge.    Final diagnoses:  Generalized abdominal pain    ED Discharge Orders     None          Ruthell Lonni JULIANNA DEVONNA 02/24/24 0500    Raford Lenis, MD 02/24/24 2247  "

## 2024-02-24 NOTE — Discharge Instructions (Signed)
 As discussed, your workup tonight was largely reassuring.  Please continue taking all prescribed medications at home.  Please follow-up with your PCP for further care.  If you have any return of symptoms please return to the ED for further care.

## 2024-02-27 ENCOUNTER — Other Ambulatory Visit: Payer: Self-pay

## 2024-02-27 ENCOUNTER — Encounter (HOSPITAL_COMMUNITY): Payer: Self-pay | Admitting: Emergency Medicine

## 2024-02-27 ENCOUNTER — Emergency Department (HOSPITAL_COMMUNITY)
Admission: EM | Admit: 2024-02-27 | Discharge: 2024-02-27 | Disposition: A | Discharge status: Home / Self Care | Attending: Emergency Medicine | Admitting: Emergency Medicine

## 2024-02-27 DIAGNOSIS — N189 Chronic kidney disease, unspecified: Secondary | ICD-10-CM | POA: Insufficient documentation

## 2024-02-27 DIAGNOSIS — Y908 Blood alcohol level of 240 mg/100 ml or more: Secondary | ICD-10-CM | POA: Insufficient documentation

## 2024-02-27 DIAGNOSIS — F10920 Alcohol use, unspecified with intoxication, uncomplicated: Secondary | ICD-10-CM | POA: Diagnosis present

## 2024-02-27 DIAGNOSIS — E1122 Type 2 diabetes mellitus with diabetic chronic kidney disease: Secondary | ICD-10-CM | POA: Insufficient documentation

## 2024-02-27 DIAGNOSIS — Z7984 Long term (current) use of oral hypoglycemic drugs: Secondary | ICD-10-CM | POA: Insufficient documentation

## 2024-02-27 DIAGNOSIS — E1165 Type 2 diabetes mellitus with hyperglycemia: Secondary | ICD-10-CM | POA: Insufficient documentation

## 2024-02-27 DIAGNOSIS — R739 Hyperglycemia, unspecified: Secondary | ICD-10-CM

## 2024-02-27 DIAGNOSIS — Z794 Long term (current) use of insulin: Secondary | ICD-10-CM | POA: Insufficient documentation

## 2024-02-27 LAB — CBC WITH DIFFERENTIAL/PLATELET
Abs Immature Granulocytes: 0.02 10*3/uL (ref 0.00–0.07)
Basophils Absolute: 0 10*3/uL (ref 0.0–0.1)
Basophils Relative: 0 %
Eosinophils Absolute: 0.1 10*3/uL (ref 0.0–0.5)
Eosinophils Relative: 2 %
HCT: 33.1 % — ABNORMAL LOW (ref 39.0–52.0)
Hemoglobin: 10.8 g/dL — ABNORMAL LOW (ref 13.0–17.0)
Immature Granulocytes: 0 %
Lymphocytes Relative: 26 %
Lymphs Abs: 1.5 10*3/uL (ref 0.7–4.0)
MCH: 32.1 pg (ref 26.0–34.0)
MCHC: 32.6 g/dL (ref 30.0–36.0)
MCV: 98.5 fL (ref 80.0–100.0)
Monocytes Absolute: 0.6 10*3/uL (ref 0.1–1.0)
Monocytes Relative: 11 %
Neutro Abs: 3.4 10*3/uL (ref 1.7–7.7)
Neutrophils Relative %: 61 %
Platelets: 276 10*3/uL (ref 150–400)
RBC: 3.36 MIL/uL — ABNORMAL LOW (ref 4.22–5.81)
RDW: 13.5 % (ref 11.5–15.5)
WBC: 5.6 10*3/uL (ref 4.0–10.5)
nRBC: 0 % (ref 0.0–0.2)

## 2024-02-27 LAB — I-STAT VENOUS BLOOD GAS, ED
Acid-base deficit: 4 mmol/L — ABNORMAL HIGH (ref 0.0–2.0)
Bicarbonate: 20.7 mmol/L (ref 20.0–28.0)
Calcium, Ion: 1.12 mmol/L — ABNORMAL LOW (ref 1.15–1.40)
HCT: 31 % — ABNORMAL LOW (ref 39.0–52.0)
Hemoglobin: 10.5 g/dL — ABNORMAL LOW (ref 13.0–17.0)
O2 Saturation: 98 %
Potassium: 4.2 mmol/L (ref 3.5–5.1)
Sodium: 144 mmol/L (ref 135–145)
TCO2: 22 mmol/L (ref 22–32)
pCO2, Ven: 37 mmHg — ABNORMAL LOW (ref 44–60)
pH, Ven: 7.356 (ref 7.25–7.43)
pO2, Ven: 117 mmHg — ABNORMAL HIGH (ref 32–45)

## 2024-02-27 LAB — COMPREHENSIVE METABOLIC PANEL WITH GFR
ALT: 20 U/L (ref 0–44)
AST: 28 U/L (ref 15–41)
Albumin: 4.1 g/dL (ref 3.5–5.0)
Alkaline Phosphatase: 106 U/L (ref 38–126)
Anion gap: 18 — ABNORMAL HIGH (ref 5–15)
BUN: 29 mg/dL — ABNORMAL HIGH (ref 6–20)
CO2: 18 mmol/L — ABNORMAL LOW (ref 22–32)
Calcium: 9 mg/dL (ref 8.9–10.3)
Chloride: 105 mmol/L (ref 98–111)
Creatinine, Ser: 1.88 mg/dL — ABNORMAL HIGH (ref 0.61–1.24)
GFR, Estimated: 41 mL/min — ABNORMAL LOW
Glucose, Bld: 309 mg/dL — ABNORMAL HIGH (ref 70–99)
Potassium: 4.4 mmol/L (ref 3.5–5.1)
Sodium: 141 mmol/L (ref 135–145)
Total Bilirubin: <0.2 mg/dL (ref 0.0–1.2)
Total Protein: 7.4 g/dL (ref 6.5–8.1)

## 2024-02-27 LAB — ETHANOL: Alcohol, Ethyl (B): 270 mg/dL — ABNORMAL HIGH

## 2024-02-27 LAB — URINALYSIS, ROUTINE W REFLEX MICROSCOPIC
Bacteria, UA: NONE SEEN
Bilirubin Urine: NEGATIVE
Glucose, UA: >=500 mg/dL — AB
Hgb urine dipstick: NEGATIVE
Ketones, ur: NEGATIVE mg/dL
Leukocytes,Ua: NEGATIVE
Nitrite: NEGATIVE
Protein, ur: NEGATIVE mg/dL
Specific Gravity, Urine: 1.01 (ref 1.005–1.030)
pH: 5 (ref 5.0–8.0)

## 2024-02-27 LAB — CBG MONITORING, ED
Glucose-Capillary: 309 mg/dL — ABNORMAL HIGH (ref 70–99)
Glucose-Capillary: 299 mg/dL — ABNORMAL HIGH (ref 70–99)
Glucose-Capillary: 284 mg/dL — ABNORMAL HIGH (ref 70–99)

## 2024-02-27 LAB — LIPASE, BLOOD: Lipase: 72 U/L — ABNORMAL HIGH (ref 11–51)

## 2024-02-27 MED ORDER — SODIUM CHLORIDE 0.9 % IV BOLUS
1000.0000 mL | Freq: Once | INTRAVENOUS | Status: AC
  Administered 2024-02-27: 1000 mL via INTRAVENOUS

## 2024-02-27 MED ORDER — INSULIN ASPART 100 UNIT/ML IJ SOLN
5.0000 [IU] | Freq: Once | INTRAMUSCULAR | Status: AC
  Administered 2024-02-27: 5 [IU] via SUBCUTANEOUS
  Filled 2024-02-27: qty 5

## 2024-02-27 NOTE — ED Notes (Signed)
 Pt ambulated with steady gait to restroom ?

## 2024-02-27 NOTE — ED Triage Notes (Signed)
 Patient arrives via [employer] for alcohol intoxication and hyperglycemia. [facility] and EMS found his blood sugar to be 425. Patient aggressive and refusing to answer questions during triage.

## 2024-02-27 NOTE — ED Notes (Incomplete)
 Pt handled

## 2024-02-27 NOTE — ED Notes (Signed)
Pt tolerating PO fluids and food well

## 2024-02-27 NOTE — Discharge Instructions (Addendum)
 Please rest and stay hydrated and continue taking your home medications.  If any symptoms change or worsen acutely, please turn to the nearest emergency department.  Please follow-up with your primary doctor.

## 2024-02-27 NOTE — ED Provider Notes (Signed)
 " [place] EMERGENCY DE[place]MENT AT [facility] Provider Note   CSN: [id number] Arrival date & time: 02/27/24  0117     Patient presents with: Alcohol Intoxication and Hyperglycemia   James Bradford is a 59 y.o. male.   The history is provided by the patient.  Alcohol Intoxication  Hyperglycemia James Bradford is a 59 y.o. male who presents to the Emergency Department complaining of alcohol intoxication and hyperglycemia.  He presents to the emergency department by [employer] for evaluation of intoxication and elevated blood sugar.  His blood sugar when he was brought to jail was 425, which prompted ED referral.  Patient states compliance with his medications, he is not sure what they are.  He does report mild abdominal discomfort and nausea.  No fevers, vomiting, dysuria, diarrhea.  He denies any SI, HI.  He does report drinking alcohol regularly but not daily.     Prior to Admission medications  Medication Sig Start Date End Date Taking? Authorizing Provider  insulin  aspart protamine - aspart (NOVOLOG  70/30 MIX) (70-30) 100 UNIT/ML FlexPen Inject 5 Units into the skin 2 (two) times daily with a meal. 03/04/23   Raenelle Coria, MD  Insulin  Pen Needle 32G X 4 MM MISC Use as directed 3 (three) times daily. 03/04/23   Ghimire, Kuber, MD  KRILL OIL PO Take 1 capsule by mouth daily.    [provider]  lisinopril  (ZESTRIL ) 10 MG tablet Take 10 mg by mouth daily.    [provider]  metFORMIN  (GLUCOPHAGE ) 500 MG tablet Take 500 mg by mouth 2 (two) times daily. 12/01/22   [provider]  Multiple Vitamins-Minerals (MENS 50+ MULTIVITAMIN PO) Take 1 tablet by mouth daily.    [provider]  ondansetron  (ZOFRAN -ODT) 4 MG disintegrating tablet 4mg  ODT q4 hours prn nausea/vomit Patient taking differently: Take 4 mg by mouth 2 (two) times daily as needed for nausea or vomiting. 01/15/23   Pollina, Lonni PARAS, MD  pantoprazole  (PROTONIX ) 20 MG tablet Take 1  tablet (20 mg total) by mouth daily. 02/09/24   Randol Simmonds, MD  pravastatin  (PRAVACHOL ) 10 MG tablet Take 1 tablet (10 mg total) by mouth daily. Patient taking differently: Take 10 mg by mouth at bedtime. 03/22/22   Sebastian Toribio GAILS, MD  sildenafil (VIAGRA) 100 MG tablet Take 100 mg by mouth daily as needed (for E.D.).    [provider]    Allergies: Ibuprofen, Porcine (pork) protein-containing drug products, Whole blood, and Asa [aspirin]    Review of Systems  All other systems reviewed and are negative.   Updated Vital Signs BP (!) 154/100   Pulse (!) 114   Temp 98 F (36.7 C)   Resp 20   Ht 5' 7 (1.702 m)   Wt 80 kg   SpO2 100%   BMI 27.62 kg/m   Physical Exam Vitals and nursing note reviewed.  Constitutional:      Appearance: He is well-developed.     Comments: Smells of EtOH  HENT:     Head: Normocephalic and atraumatic.  Cardiovascular:     Rate and Rhythm: Normal rate and regular rhythm.  Pulmonary:     Effort: Pulmonary effort is normal. No respiratory distress.  Abdominal:     Palpations: Abdomen is soft.     Tenderness: There is no guarding or rebound.     Comments: Mild generalized abdominal tenderness  Musculoskeletal:        General: No tenderness.  Skin:  General: Skin is warm and dry.  Neurological:     Mental Status: He is alert and oriented to person, place, and time.  Psychiatric:        Behavior: Behavior normal.     (all labs ordered are listed, but only abnormal results are displayed) Labs Reviewed  COMPREHENSIVE METABOLIC PANEL WITH GFR - Abnormal; Notable for the following components:      Result Value   CO2 18 (*)    Glucose, Bld 309 (*)    BUN 29 (*)    Creatinine, Ser 1.88 (*)    GFR, Estimated 41 (*)    Anion gap 18 (*)    All other components within normal limits  LIPASE, BLOOD - Abnormal; Notable for the following components:   Lipase 72 (*)    All other components within normal limits  CBC WITH  DIFFERENTIAL/PLATELET - Abnormal; Notable for the following components:   RBC 3.36 (*)    Hemoglobin 10.8 (*)    HCT 33.1 (*)    All other components within normal limits  ETHANOL - Abnormal; Notable for the following components:   Alcohol, Ethyl (B) 270 (*)    All other components within normal limits  CBG MONITORING, ED - Abnormal; Notable for the following components:   Glucose-Capillary 309 (*)    All other components within normal limits  I-STAT VENOUS BLOOD GAS, ED - Abnormal; Notable for the following components:   pCO2, Ven 37.0 (*)    pO2, Ven 117 (*)    Acid-base deficit 4.0 (*)    Calcium, Ion 1.12 (*)    HCT 31.0 (*)    Hemoglobin 10.5 (*)    All other components within normal limits  CBG MONITORING, ED - Abnormal; Notable for the following components:   Glucose-Capillary 299 (*)    All other components within normal limits  URINALYSIS, ROUTINE W REFLEX MICROSCOPIC  CBG MONITORING, ED    EKG: None  Radiology: No results found.   Procedures   Medications Ordered in the ED  insulin  aspart (novoLOG ) injection 5 Units (has no administration in time range)  sodium chloride  0.9 % bolus 1,000 mL (0 mLs Intravenous Stopped 02/27/24 0701)  sodium chloride  0.9 % bolus 1,000 mL (0 mLs Intravenous Stopped 02/27/24 9287)                                    Medical Decision Making Amount and/or Complexity of Data Reviewed Labs: ordered.  Risk Prescription drug management.   Patient with history of diabetes, CKD here for evaluation of alcohol intoxication with elevated blood sugars.  He does have hyperglycemia.  He has a mildly low bicarb and elevated anion gap.  His VBG with a normal pH.  Suspect this is secondary to alcohol intoxication, possible mild alcoholic ketoacidosis.  He was treated with IV fluids with improvement in his blood sugar and he does feel improved at this time.  He is able to eat without difficulty.  Abdomen is soft and nontender.  He does have mild  worsening of his CKD.  He was treated with IV fluid hydration.  No evidence of acute infectious process, acute intra-abdominal emergency.  Feel he is stable for discharge with outpatient follow-up.  Patient is slightly intoxicated still at time of repeat evaluation.  Patient care transferred pending metabolization of alcohol.     Final diagnoses:  Alcoholic intoxication without complication  Hyperglycemia  ED Discharge Orders     None          Griselda Norris, MD 02/27/24 [phone]  "

## 2024-02-27 NOTE — ED Notes (Signed)
 Pt given apple juice plus turkey sandwhich

## 2024-02-27 NOTE — ED Provider Notes (Signed)
 Care assumed from Dr. Griselda.  At time of transfer of care, patient is awaiting metabolism of the EtOH prior to plan for discharge after.  Patient's glucose was improving and he does not appear to be in DKA with out the acidosis.  Plan of care will be to metabolize his EtOH and then p.o. challenge and plan for likely discharge later this morning.  12:16 PM Patient rested and is now feeling much better.  He was able to ambulate safely to the bathroom and back and was able to drink something.  He would like to go home.  I suspect he has metabolized his EtOH and we feel he is safe for discharge home.  Glucose was improving last time was checked.  Will discharge for outpatient follow-up.   Clinical Impression: 1. Alcoholic intoxication without complication   2. Hyperglycemia     Disposition: Discharge  Condition: Good  I have discussed the results, Dx and Tx plan with the pt(& family if present). He/she/they expressed understanding and agree(s) with the plan. Discharge instructions discussed at great length. Strict return precautions discussed and pt &/or family have verbalized understanding of the instructions. No further questions at time of discharge.    New Prescriptions   No medications on file    Follow Up: Medicine, [facility] [address] [place] [address] [phone]        Marva Hendryx, Lonni PARAS, MD 02/27/24 [phone]
